# Patient Record
Sex: Female | Born: 1935 | Race: White | Hispanic: No | Smoking: Former smoker
Health system: Southern US, Community
[De-identification: ages and names within clinical notes are randomized; demographics above are authoritative.]

## PROBLEM LIST (undated history)

## (undated) DIAGNOSIS — I5022 Chronic systolic (congestive) heart failure: Secondary | ICD-10-CM

## (undated) DIAGNOSIS — H409 Unspecified glaucoma: Secondary | ICD-10-CM

## (undated) DIAGNOSIS — Z8489 Family history of other specified conditions: Secondary | ICD-10-CM

## (undated) DIAGNOSIS — E785 Hyperlipidemia, unspecified: Secondary | ICD-10-CM

## (undated) DIAGNOSIS — I272 Pulmonary hypertension, unspecified: Secondary | ICD-10-CM

## (undated) DIAGNOSIS — M1990 Unspecified osteoarthritis, unspecified site: Secondary | ICD-10-CM

## (undated) DIAGNOSIS — I480 Paroxysmal atrial fibrillation: Secondary | ICD-10-CM

## (undated) DIAGNOSIS — G43909 Migraine, unspecified, not intractable, without status migrainosus: Secondary | ICD-10-CM

## (undated) DIAGNOSIS — G4700 Insomnia, unspecified: Secondary | ICD-10-CM

## (undated) DIAGNOSIS — E039 Hypothyroidism, unspecified: Secondary | ICD-10-CM

## (undated) DIAGNOSIS — Z8781 Personal history of (healed) traumatic fracture: Secondary | ICD-10-CM

## (undated) DIAGNOSIS — M8580 Other specified disorders of bone density and structure, unspecified site: Secondary | ICD-10-CM

## (undated) DIAGNOSIS — Z972 Presence of dental prosthetic device (complete) (partial): Secondary | ICD-10-CM

## (undated) DIAGNOSIS — R739 Hyperglycemia, unspecified: Secondary | ICD-10-CM

## (undated) DIAGNOSIS — D649 Anemia, unspecified: Secondary | ICD-10-CM

## (undated) DIAGNOSIS — J449 Chronic obstructive pulmonary disease, unspecified: Secondary | ICD-10-CM

## (undated) HISTORY — PX: REFRACTIVE SURGERY: SHX103

## (undated) HISTORY — PX: CATARACT EXTRACTION: SUR2

## (undated) HISTORY — DX: Personal history of (healed) traumatic fracture: Z87.81

## (undated) HISTORY — DX: Chronic obstructive pulmonary disease, unspecified: J44.9

## (undated) HISTORY — DX: Chronic systolic (congestive) heart failure: I50.22

## (undated) HISTORY — DX: Paroxysmal atrial fibrillation: I48.0

## (undated) HISTORY — DX: Anemia, unspecified: D64.9

## (undated) HISTORY — DX: Hyperglycemia, unspecified: R73.9

## (undated) HISTORY — DX: Pulmonary hypertension, unspecified: I27.20

## (undated) HISTORY — DX: Hypothyroidism, unspecified: E03.9

## (undated) HISTORY — DX: Unspecified glaucoma: H40.9

## (undated) HISTORY — DX: Unspecified osteoarthritis, unspecified site: M19.90

## (undated) HISTORY — DX: Insomnia, unspecified: G47.00

## (undated) HISTORY — DX: Other specified disorders of bone density and structure, unspecified site: M85.80

## (undated) HISTORY — DX: Hyperlipidemia, unspecified: E78.5

## (undated) HISTORY — DX: Migraine, unspecified, not intractable, without status migrainosus: G43.909

---

## 2005-02-26 ENCOUNTER — Ambulatory Visit: Payer: Self-pay | Admitting: Unknown Physician Specialty

## 2005-02-26 ENCOUNTER — Other Ambulatory Visit: Payer: Self-pay

## 2009-06-07 ENCOUNTER — Ambulatory Visit: Payer: Self-pay | Admitting: Unknown Physician Specialty

## 2009-06-09 ENCOUNTER — Ambulatory Visit: Payer: Self-pay | Admitting: Unknown Physician Specialty

## 2010-04-23 HISTORY — PX: COLONOSCOPY: SHX174

## 2010-07-06 ENCOUNTER — Ambulatory Visit: Payer: Self-pay | Admitting: Unknown Physician Specialty

## 2010-08-17 ENCOUNTER — Ambulatory Visit (INDEPENDENT_AMBULATORY_CARE_PROVIDER_SITE_OTHER): Payer: Medicare Other | Admitting: Cardiovascular Disease

## 2010-08-17 ENCOUNTER — Encounter: Payer: Self-pay | Admitting: Cardiovascular Disease

## 2010-08-17 VITALS — BP 134/78 | HR 113 | Ht 60.0 in | Wt 133.0 lb

## 2010-08-17 DIAGNOSIS — F17200 Nicotine dependence, unspecified, uncomplicated: Secondary | ICD-10-CM | POA: Insufficient documentation

## 2010-08-17 DIAGNOSIS — E785 Hyperlipidemia, unspecified: Secondary | ICD-10-CM | POA: Insufficient documentation

## 2010-08-17 DIAGNOSIS — I4891 Unspecified atrial fibrillation: Secondary | ICD-10-CM

## 2010-08-17 DIAGNOSIS — I495 Sick sinus syndrome: Secondary | ICD-10-CM | POA: Insufficient documentation

## 2010-08-17 DIAGNOSIS — I471 Supraventricular tachycardia, unspecified: Secondary | ICD-10-CM | POA: Insufficient documentation

## 2010-08-17 MED ORDER — METOPROLOL TARTRATE 25 MG PO TABS: 25.0000 mg | ORAL_TABLET | Freq: Two times a day (BID) | ORAL | Status: DC

## 2010-08-17 MED ORDER — DABIGATRAN ETEXILATE MESYLATE 150 MG PO CAPS: 150.0000 mg | ORAL_CAPSULE | Freq: Two times a day (BID) | ORAL | Status: DC

## 2010-08-17 NOTE — Assessment & Plan Note (Signed)
Would continue simvastatin 20 mg daily.

## 2010-08-17 NOTE — Patient Instructions (Addendum)
Please start pradaxa one in the Am and PM. This is a blood thinner. Please start metoprolol 25mg  1/2 tab in Am and PM. This is for heart rate control. Please monitor your BP and bring list of results to next appt. Your physician has requested that you have an echocardiogram. Echocardiography is a painless test that uses sound waves to create images of your heart. It provides your doctor with information about the size and shape of your heart and how well your heart's chambers and valves are working. This procedure takes approximately one hour. There are no restrictions for this procedure. Your appt for this is next Thursday:  Your physician recommends that you schedule a follow-up appointment in: 2 weeks:

## 2010-08-17 NOTE — Assessment & Plan Note (Signed)
She has bradycardia on exam, atrial fibrillation on EKG with rapid rate. I am concerned that she might need a pacemaker. We will start with low-dose metoprolol tartrate and have her monitor her blood pressure and heart rate at home. EKG pending. We will likely need a two-day Holter monitor.

## 2010-08-17 NOTE — Assessment & Plan Note (Signed)
We have encouraged her to stop smoking. She continues to smoke several cigarettes per day.

## 2010-08-17 NOTE — Progress Notes (Signed)
   Patient ID: Vanessa [name], female    DOB: [birth year], 76 y.o.   MRN: [id number]  HPI Comments: [name] is a very pleasant 76 year old woman, patient of Dr. Barnabas Lister, who presents by referral after she developed atrial fibrillation prior to her colonoscopy. She presents for evaluation of atrial fibrillation. She does have severe migraines at times, history of hyperlipidemia and hypothyroidism. She continues to smoke.  She reports that she does have frequent periods of anxiety. She takes Xanax at nighttime which seems to help these episodes. She is uncertain if she does appreciate periods of rapid atrial fibrillation like she did in the hospital recently. She was scheduled to have her colonoscopy and prior to the procedure, she reports having a run of atrial fibrillation noted by the anesthesiologist. She reports that after she calmed down, her rhythm went back to normal and she then proceeded with the colonoscopy. She denies any chest tightness, lightheadedness or dizziness. If she has a bad migraine, sometimes she gets dizzy.   She routinely checks her blood pressure at home and reports that it is never high, typically borderline low.  EKG today shows atrial fibrillation converting to normal sinus rhythm with APCs. Rate while in atrial fibrillation was 140-150 beats per minute     Review of Systems  Constitutional: Negative.   HENT: Negative.   Eyes: Negative.   Respiratory: Negative.   Cardiovascular: Positive for palpitations.  Gastrointestinal: Negative.   Musculoskeletal: Negative.   Skin: Negative.   Neurological: Negative.   Hematological: Negative.   Psychiatric/Behavioral: The patient is nervous/anxious.   All other systems reviewed and are negative.   BP 134/78  Pulse 113  Ht 5' (1.524 m)  Wt 133 lb (60.328 kg)  BMI 25.97 kg/m2   Physical Exam  Nursing note and vitals reviewed. Constitutional: She is oriented to person, place, and time. She appears  well-developed and well-nourished.  HENT:  Head: Normocephalic.  Nose: Nose normal.  Mouth/Throat: Oropharynx is clear and moist.  Eyes: Conjunctivae are normal. Pupils are equal, round, and reactive to light.  Neck: Normal range of motion. Neck supple. No JVD present.  Cardiovascular: Regular rhythm, S1 normal, S2 normal and intact distal pulses.   Occasional extrasystoles are present. Bradycardia present.  Exam reveals no gallop and no friction rub.   Murmur heard.  Crescendo systolic murmur is present with a grade of 1/6  Pulses:      Carotid pulses are 2+ on the right side, and 2+ on the left side.      Radial pulses are 2+ on the right side, and 2+ on the left side.       Posterior tibial pulses are 1+ on the right side, and 1+ on the left side.  Pulmonary/Chest: Effort normal and breath sounds normal. No respiratory distress. She has no wheezes. She has no rales. She exhibits no tenderness.  Abdominal: Soft. Bowel sounds are normal. She exhibits no distension. There is no tenderness.  Musculoskeletal: Normal range of motion. She exhibits no edema and no tenderness.  Lymphadenopathy:    She has no cervical adenopathy.  Neurological: She is alert and oriented to person, place, and time. Coordination normal.  Skin: Skin is warm and dry. No rash noted. No erythema.  Psychiatric: She has a normal mood and affect. Her behavior is normal. Judgment and thought content normal.         Assessment and Plan

## 2010-08-17 NOTE — Assessment & Plan Note (Signed)
She has paroxysmal atrial fibrillation. She has converted back to sinus rhythm today after EKG showed atrial fibrillation in the office. We have ordered an echocardiogram. We will start metoprolol tartrate 12.5 mg b.i.d. For rhythm control. I suspect she has underlying sick sinus syndrome. We will start her on pradaxa 150 mg b.i.d..

## 2010-08-23 ENCOUNTER — Other Ambulatory Visit: Payer: Self-pay | Admitting: Cardiovascular Disease

## 2010-08-23 DIAGNOSIS — I4891 Unspecified atrial fibrillation: Secondary | ICD-10-CM

## 2010-08-24 ENCOUNTER — Ambulatory Visit (INDEPENDENT_AMBULATORY_CARE_PROVIDER_SITE_OTHER): Payer: Medicare Other | Admitting: *Deleted

## 2010-08-24 ENCOUNTER — Other Ambulatory Visit: Payer: Self-pay | Admitting: *Deleted

## 2010-08-24 DIAGNOSIS — I4891 Unspecified atrial fibrillation: Secondary | ICD-10-CM

## 2010-08-24 DIAGNOSIS — I495 Sick sinus syndrome: Secondary | ICD-10-CM

## 2010-08-27 ENCOUNTER — Encounter: Payer: Self-pay | Admitting: Cardiovascular Disease

## 2010-09-01 ENCOUNTER — Ambulatory Visit (INDEPENDENT_AMBULATORY_CARE_PROVIDER_SITE_OTHER): Payer: Medicare Other | Admitting: Cardiovascular Disease

## 2010-09-01 ENCOUNTER — Encounter: Payer: Self-pay | Admitting: Cardiovascular Disease

## 2010-09-01 DIAGNOSIS — I4891 Unspecified atrial fibrillation: Secondary | ICD-10-CM

## 2010-09-01 DIAGNOSIS — E785 Hyperlipidemia, unspecified: Secondary | ICD-10-CM

## 2010-09-01 DIAGNOSIS — I495 Sick sinus syndrome: Secondary | ICD-10-CM

## 2010-09-01 DIAGNOSIS — F17200 Nicotine dependence, unspecified, uncomplicated: Secondary | ICD-10-CM

## 2010-09-01 MED ORDER — METOPROLOL TARTRATE 12.5 MG HALF TABLET: 12.5000 mg | ORAL_TABLET | Freq: Every day | ORAL | Status: DC

## 2010-09-01 NOTE — Assessment & Plan Note (Signed)
She would like to stop the anticoagulation. She denies any palp patient's concerning for atrial fibrillation. We will decrease the metoprolol, at her request all the anticoagulation and monitor her closely.

## 2010-09-01 NOTE — Patient Instructions (Addendum)
You are doing well. Stop Pradaxa for now, keep your samples in case we restart medication. Decrease Metoprolol to 1/2 tablet once daily. Please call us if you have new issues that need to be addressed before your next appt.  Your physician recommends that you schedule a follow-up appointment in: 4-6 weeks.

## 2010-09-01 NOTE — Assessment & Plan Note (Signed)
We have encouraged her to continue to work on weaning his cigarettes and smoking cessation.

## 2010-09-01 NOTE — Assessment & Plan Note (Signed)
Continue the simvastatin.  

## 2010-09-01 NOTE — Assessment & Plan Note (Signed)
She has bradycardia on very low-dose metoprolol 12.5 mg b.i.d. We will cut her metoprolol back to 12.5 mg daily in the morning.

## 2010-09-01 NOTE — Progress Notes (Signed)
   Patient ID: Vanessa [name], female    DOB: 09-Jan-1935, 76 y.o.   MRN: [id number]  HPI Comments: s. Felber is a very pleasant 76 year old woman, patient of Dr. Barnabas Lister, who presents by referral after she developed atrial fibrillation prior to her colonoscopy. She presents for evaluation of atrial fibrillation. She does have severe migraines at times, history of hyperlipidemia and hypothyroidism. She continues to smoke.   She was started on metoprolol 12.5 mg b.i.d. With pradaxa. She would like to stop the blood thinner. She did not provide a reason. She also feels very fatigued and would like to stop the metoprolol. She does provide blood pressure and heart rate numbers. Blood pressure is in the 100s to 120 range. Pulse is in the high 40s to 50s, occasional 60s  She reports that her blood pressure typically runs low  EKG today shows Sinus bradycardia with rate 42 beats per minute, no significant ST or T wave changes      Review of Systems  Constitutional: Positive for fatigue.  HENT: Negative.   Eyes: Negative.   Respiratory: Negative.   Cardiovascular: Negative.   Gastrointestinal: Negative.   Musculoskeletal: Negative.   Skin: Negative.   Neurological: Negative.   Hematological: Negative.   Psychiatric/Behavioral: Negative.   All other systems reviewed and are negative.    BP 98/62  Pulse 43  Ht 5\' 4"  (1.626 m)  Wt 133 lb (60.328 kg)  BMI 22.83 kg/m2   Physical Exam  Nursing note and vitals reviewed. Constitutional: She is oriented to person, place, and time. She appears well-developed and well-nourished.  HENT:  Head: Normocephalic.  Nose: Nose normal.  Mouth/Throat: Oropharynx is clear and moist.  Eyes: Conjunctivae are normal. Pupils are equal, round, and reactive to light.  Neck: Normal range of motion. Neck supple. No JVD present.  Cardiovascular: Normal rate, regular rhythm, S1 normal, S2 normal, normal heart sounds and intact distal pulses.  Exam reveals  no gallop and no friction rub.   No murmur heard. Pulmonary/Chest: Effort normal and breath sounds normal. No respiratory distress. She has no wheezes. She has no rales. She exhibits no tenderness.  Abdominal: Soft. Bowel sounds are normal. She exhibits no distension. There is no tenderness.  Musculoskeletal: Normal range of motion. She exhibits no edema and no tenderness.  Lymphadenopathy:    She has no cervical adenopathy.  Neurological: She is alert and oriented to person, place, and time. Coordination normal.  Skin: Skin is warm and dry. No rash noted. No erythema.  Psychiatric: She has a normal mood and affect. Her behavior is normal. Judgment and thought content normal.         Assessment and Plan

## 2010-09-19 ENCOUNTER — Encounter: Payer: Self-pay | Admitting: Cardiovascular Disease

## 2010-10-16 ENCOUNTER — Encounter: Payer: Self-pay | Admitting: Cardiovascular Disease

## 2010-10-16 ENCOUNTER — Ambulatory Visit (INDEPENDENT_AMBULATORY_CARE_PROVIDER_SITE_OTHER): Payer: Medicare Other | Admitting: Cardiovascular Disease

## 2010-10-16 VITALS — BP 122/68 | HR 47 | Ht 64.0 in | Wt 133.0 lb

## 2010-10-16 DIAGNOSIS — I4891 Unspecified atrial fibrillation: Secondary | ICD-10-CM

## 2010-10-16 DIAGNOSIS — I495 Sick sinus syndrome: Secondary | ICD-10-CM

## 2010-10-16 DIAGNOSIS — F17200 Nicotine dependence, unspecified, uncomplicated: Secondary | ICD-10-CM

## 2010-10-16 DIAGNOSIS — E785 Hyperlipidemia, unspecified: Secondary | ICD-10-CM

## 2010-10-16 MED ORDER — METOPROLOL TARTRATE 12.5 MG HALF TABLET: 12.5000 mg | ORAL_TABLET | Freq: Every day | ORAL | Status: DC | PRN

## 2010-10-16 NOTE — Assessment & Plan Note (Signed)
No further episodes of atrial fibrillation that she can appreciate. She continues to have bradycardia. She is less symptomatic on very low-dose metoprolol. As her blood pressure and heart rate continued to be very low, we have suggested that perhaps she hold the metoprolol and take only a very low dose as needed for palpitations.

## 2010-10-16 NOTE — Patient Instructions (Signed)
You are doing well. Please hold the metoprolol as your heart rate is low. Use a low dose of metoprolol a needed.  Please call us if you have new issues that need to be addressed before your next appt.  We will call you for a follow up Appt. In 6 months

## 2010-10-16 NOTE — Assessment & Plan Note (Signed)
We have suggested she continue her current cholesterol medication

## 2010-10-16 NOTE — Assessment & Plan Note (Signed)
We have encouraged her to continue to work on weaning her cigarettes and smoking cessation. She will continue to work on this and does not want any assistance with chantix.  

## 2010-10-16 NOTE — Progress Notes (Signed)
   Patient ID: Vanessa [name], female    DOB: 20-Sep-1934, 76 y.o.   MRN: [id number]  HPI Comments: Vanessa [name] is a very pleasant 76 year old woman, patient of Dr. Barnabas Lister, who presents by referral after she developed atrial fibrillation prior to her colonoscopy.  She does have severe migraines at times, history of hyperlipidemia and hypothyroidism. She continues to smoke 2 cigarettes a day which she started after she lost her husband. She presents for routine followup.  On her last clinic visit, she had significant bradycardia on low-dose metoprolol. We suggested she try even a very low dose such as a quarter of a pill of metoprolol tartrate 25 mg. She has been doing this and according her heart rate and blood pressures. Typically the heart rate runs in the high 40s to 60 range, blood pressures running in the 90s to 110 range systolic. She feels better on less metoprolol and otherwise has no complaints.  EKG today shows Sinus bradycardia with rate 47 beats per minute, no significant ST or T wave changes, Rare APC      Review of Systems  Constitutional: Positive for fatigue.  HENT: Negative.   Eyes: Negative.   Respiratory: Negative.   Cardiovascular: Negative.   Gastrointestinal: Negative.   Musculoskeletal: Negative.   Skin: Negative.   Neurological: Negative.   Hematological: Negative.   Psychiatric/Behavioral: Negative.   All other systems reviewed and are negative.    BP 122/68  Pulse 47  Ht 5\' 4"  (1.626 m)  Wt 133 lb (60.328 kg)  BMI 22.83 kg/m2   Physical Exam  Nursing note and vitals reviewed. Constitutional: She is oriented to person, place, and time. She appears well-developed and well-nourished.  HENT:  Head: Normocephalic.  Nose: Nose normal.  Mouth/Throat: Oropharynx is clear and moist.  Eyes: Conjunctivae are normal. Pupils are equal, round, and reactive to light.  Neck: Normal range of motion. Neck supple. No JVD present.  Cardiovascular: Regular rhythm, S1  normal, S2 normal, normal heart sounds and intact distal pulses.  Bradycardia present.  Exam reveals no gallop and no friction rub.   No murmur heard. Pulmonary/Chest: Effort normal and breath sounds normal. No respiratory distress. She has no wheezes. She has no rales. She exhibits no tenderness.  Abdominal: Soft. Bowel sounds are normal. She exhibits no distension. There is no tenderness.  Musculoskeletal: Normal range of motion. She exhibits no edema and no tenderness.  Lymphadenopathy:    She has no cervical adenopathy.  Neurological: She is alert and oriented to person, place, and time. Coordination normal.  Skin: Skin is warm and dry. No rash noted. No erythema.  Psychiatric: She has a normal mood and affect. Her behavior is normal. Judgment and thought content normal.         Assessment and Plan

## 2010-12-12 ENCOUNTER — Ambulatory Visit: Payer: Self-pay | Admitting: Ophthalmology

## 2011-01-09 ENCOUNTER — Ambulatory Visit: Payer: Medicare Other | Admitting: Family Medicine

## 2011-01-24 ENCOUNTER — Ambulatory Visit (INDEPENDENT_AMBULATORY_CARE_PROVIDER_SITE_OTHER): Payer: Medicare Other | Admitting: Family Medicine

## 2011-01-24 ENCOUNTER — Encounter: Payer: Self-pay | Admitting: Family Medicine

## 2011-01-24 VITALS — BP 136/66 | HR 59 | Temp 99.0°F | Ht 64.0 in | Wt 128.0 lb

## 2011-01-24 DIAGNOSIS — E039 Hypothyroidism, unspecified: Secondary | ICD-10-CM

## 2011-01-24 DIAGNOSIS — E7800 Pure hypercholesterolemia, unspecified: Secondary | ICD-10-CM

## 2011-01-24 DIAGNOSIS — G4700 Insomnia, unspecified: Secondary | ICD-10-CM

## 2011-01-24 DIAGNOSIS — E785 Hyperlipidemia, unspecified: Secondary | ICD-10-CM

## 2011-01-24 DIAGNOSIS — M8580 Other specified disorders of bone density and structure, unspecified site: Secondary | ICD-10-CM

## 2011-01-24 DIAGNOSIS — I4891 Unspecified atrial fibrillation: Secondary | ICD-10-CM

## 2011-01-24 DIAGNOSIS — M899 Disorder of bone, unspecified: Secondary | ICD-10-CM

## 2011-01-24 DIAGNOSIS — F17200 Nicotine dependence, unspecified, uncomplicated: Secondary | ICD-10-CM

## 2011-01-24 DIAGNOSIS — D649 Anemia, unspecified: Secondary | ICD-10-CM

## 2011-01-24 DIAGNOSIS — Z23 Encounter for immunization: Secondary | ICD-10-CM

## 2011-01-24 DIAGNOSIS — Z7189 Other specified counseling: Secondary | ICD-10-CM

## 2011-01-24 DIAGNOSIS — IMO0001 Reserved for inherently not codable concepts without codable children: Secondary | ICD-10-CM

## 2011-01-24 NOTE — Progress Notes (Signed)
To est care.  Migraine.  No aura.  Triggered with weather related, certain scents. H/o syncope with migraine, but not in years.  She pre treats with her meds with some effect.  Sig FH of migraine.   AF.  During colonoscopy.  No sx since then.  Has prn BB to use.  No CP, not SOB.  Feeling well from this standpoint.   Smoking.  Cut down to 1-2 cigs a day.    Osteopenia.  Will be due for repeat DXA 2013.  On vit D and s/p 10 years of fosamax.   Elevated Cholesterol: Using medications without problems:yes Muscle aches: minimal Diet compliance:yes Exercise:yes  PMH and SH reviewed  Meds, vitals, and allergies reviewed.   ROS: See HPI.  Otherwise negative.    GEN: nad, alert and oriented, thin, pleasant in conversation HEENT: mucous membranes moist NECK: supple w/o LA CV: rrr, not brady, no ectopy PULM: ctab, no inc wob ABD: soft, +bs EXT: no edema SKIN: no acute rash

## 2011-01-24 NOTE — Patient Instructions (Addendum)
I would get your lab appointment (fasting) before a physical in early Jan. 2013.   Please bring in a copy of your living will.  Don't change your meds.   Take care.  Glad to see you.  Let me know if you have concerns in the meantime.

## 2011-01-25 ENCOUNTER — Encounter: Payer: Self-pay | Admitting: Family Medicine

## 2011-01-25 DIAGNOSIS — G4700 Insomnia, unspecified: Secondary | ICD-10-CM | POA: Insufficient documentation

## 2011-01-25 DIAGNOSIS — M8580 Other specified disorders of bone density and structure, unspecified site: Secondary | ICD-10-CM | POA: Insufficient documentation

## 2011-01-25 DIAGNOSIS — E039 Hypothyroidism, unspecified: Secondary | ICD-10-CM | POA: Insufficient documentation

## 2011-01-25 DIAGNOSIS — Z23 Encounter for immunization: Secondary | ICD-10-CM

## 2011-01-25 NOTE — Assessment & Plan Note (Signed)
Per cards  

## 2011-01-25 NOTE — Assessment & Plan Note (Signed)
Continue BZD for now.

## 2011-01-25 NOTE — Assessment & Plan Note (Signed)
No change in meds.

## 2011-01-25 NOTE — Assessment & Plan Note (Signed)
D/w pt about cessation.  

## 2011-01-25 NOTE — Assessment & Plan Note (Signed)
Continue current meds.  TSH wnl.  

## 2011-04-23 ENCOUNTER — Other Ambulatory Visit: Payer: Medicare Other

## 2011-04-23 ENCOUNTER — Ambulatory Visit: Payer: Medicare Other | Admitting: Cardiovascular Disease

## 2011-04-26 ENCOUNTER — Encounter: Payer: Medicare Other | Admitting: Family Medicine

## 2011-05-09 ENCOUNTER — Encounter: Payer: Self-pay | Admitting: Cardiovascular Disease

## 2011-05-09 ENCOUNTER — Ambulatory Visit (INDEPENDENT_AMBULATORY_CARE_PROVIDER_SITE_OTHER): Payer: Medicare Other | Admitting: Cardiovascular Disease

## 2011-05-09 DIAGNOSIS — E785 Hyperlipidemia, unspecified: Secondary | ICD-10-CM

## 2011-05-09 DIAGNOSIS — I495 Sick sinus syndrome: Secondary | ICD-10-CM

## 2011-05-09 DIAGNOSIS — I4891 Unspecified atrial fibrillation: Secondary | ICD-10-CM

## 2011-05-09 DIAGNOSIS — F17200 Nicotine dependence, unspecified, uncomplicated: Secondary | ICD-10-CM

## 2011-05-09 NOTE — Assessment & Plan Note (Signed)
I suspect her short runs of tachycardia and palpitations is atrial fibrillation given her previous history. Unable to exclude other arrhythmia such as atrial tachycardia or ectopy though her rhythm was heard on auscultation briefly. She does not want additional antiarrhythmic medications at this time while workup including Holter. She reports they are rare, she is relatively asymptomatic. We have suggested if she has more of these episodes and if they last longer, that she call our office for further evaluation. We did discuss possible stroke risk if she has significant arrhythmia and the need for further workup.

## 2011-05-09 NOTE — Patient Instructions (Signed)
You are doing well. No medication changes were made.  Please call us if you have new issues that need to be addressed before your next appt.  Your physician wants you to follow-up in: 6 months.  You will receive a reminder letter in the mail two months in advance. If you don't receive a letter, please call our office to schedule the follow-up appointment.   

## 2011-05-09 NOTE — Assessment & Plan Note (Signed)
Continue simvastatin 20 mg daily 

## 2011-05-09 NOTE — Assessment & Plan Note (Signed)
We have encouraged her to continue to work on weaning her cigarettes and smoking cessation.  

## 2011-05-09 NOTE — Progress Notes (Signed)
Patient ID: Vanessa [name], female    DOB: December 05, 1934, 77 y.o.   MRN: [id number]  HPI Comments: Vanessa [name] is a very pleasant 77 year old woman with a hx of atrial fibrillation prior to her colonoscopy, h/o severe migraines at times, history of hyperlipidemia and hypothyroidism. She continues to smoke 2 cigarettes a day which she started after she lost her husband. She presents for routine followup.  She reports that she has rare episodes of palpitations and tachycardia. These typically happen when she is anxious. She had a brief episode in the waiting room today and while on the exam table. They do not last for long, typically several minutes at the most. She reports having them on a rare basis and again only when she is anxious. She is able to exert herself to a significant degree and is unable to reproduce the arrhythmia. She takes low-dose metoprolol on occasional basis.  Otherwise she feels well has no complaints.  EKG today shows Sinus bradycardia with rate 52 beats per minute, no significant ST or T wave changes, Rare APC    Outpatient Encounter Prescriptions as of 05/09/2011  Medication Sig Dispense Refill  . ALPRAZolam (XANAX) 0.5 MG tablet Take 0.5 mg by mouth at bedtime as needed.        Marland Kitchen aspirin 81 MG EC tablet Take 81 mg by mouth daily.        Marland Kitchen aspirin-acetaminophen-caffeine (EXCEDRIN MIGRAINE) 250-250-65 MG per tablet Take 1 tablet by mouth every 6 (six) hours as needed.        . Calcium Carbonate-Vitamin D (CALCIUM 600 + D PO) Take 600 mg by mouth 2 (two) times daily.        . Cholecalciferol (VITAMIN D PO) Take by mouth daily.        . fish oil-omega-3 fatty acids 1000 MG capsule Take 1 g by mouth daily.        Marland Kitchen levothyroxine (SYNTHROID, LEVOTHROID) 50 MCG tablet Take 50 mcg by mouth daily.        . metoprolol tartrate (LOPRESSOR) 12.5 mg TABS Take 6.25 mg by mouth daily as needed. Take 1/4 tablet before procedures due to h/o A fib during colonsocopy       . simvastatin  (ZOCOR) 20 MG tablet Take 20 mg by mouth at bedtime.        . Thiamine HCl (VITAMIN B-1) 100 MG tablet Take 100 mg by mouth daily.        . traMADol (ULTRAM) 50 MG tablet Take 50 mg by mouth every 6 (six) hours as needed.        . vitamin B-12 (CYANOCOBALAMIN) 1000 MCG tablet Take 1,000 mcg by mouth daily.        . vitamin C (ASCORBIC ACID) 500 MG tablet Take 500 mg by mouth daily.        Marland Kitchen DISCONTD: SUMAtriptan (IMITREX) 100 MG tablet Take 100 mg by mouth every 2 (two) hours as needed.           Review of Systems  Constitutional: Positive for fatigue.  HENT: Negative.   Eyes: Negative.   Respiratory: Negative.   Cardiovascular: Positive for palpitations.  Gastrointestinal: Negative.   Musculoskeletal: Negative.   Skin: Negative.   Neurological: Negative.   Hematological: Negative.   Psychiatric/Behavioral: Negative.   All other systems reviewed and are negative.    BP 140/80  Pulse 52  Ht 5\' 4"  (1.626 m)  Wt 127 lb (57.607 kg)  BMI 21.80 kg/m2  Physical Exam  Nursing note and vitals reviewed. Constitutional: She is oriented to person, place, and time. She appears well-developed and well-nourished.  HENT:  Head: Normocephalic.  Nose: Nose normal.  Mouth/Throat: Oropharynx is clear and moist.  Eyes: Conjunctivae are normal. Pupils are equal, round, and reactive to light.  Neck: Normal range of motion. Neck supple. No JVD present.  Cardiovascular: Regular rhythm, S1 normal, S2 normal and intact distal pulses.  Bradycardia present.  Exam reveals no gallop and no friction rub.   Murmur heard.  Crescendo systolic murmur is present with a grade of 2/6  Pulmonary/Chest: Effort normal and breath sounds normal. No respiratory distress. She has no wheezes. She has no rales. She exhibits no tenderness.  Abdominal: Soft. Bowel sounds are normal. She exhibits no distension. There is no tenderness.  Musculoskeletal: Normal range of motion. She exhibits no edema and no tenderness.    Lymphadenopathy:    She has no cervical adenopathy.  Neurological: She is alert and oriented to person, place, and time. Coordination normal.  Skin: Skin is warm and dry. No rash noted. No erythema.  Psychiatric: She has a normal mood and affect. Her behavior is normal. Judgment and thought content normal.         Assessment and Plan

## 2011-05-10 ENCOUNTER — Other Ambulatory Visit (INDEPENDENT_AMBULATORY_CARE_PROVIDER_SITE_OTHER): Payer: Medicare Other

## 2011-05-10 DIAGNOSIS — M8580 Other specified disorders of bone density and structure, unspecified site: Secondary | ICD-10-CM

## 2011-05-10 DIAGNOSIS — D649 Anemia, unspecified: Secondary | ICD-10-CM

## 2011-05-10 DIAGNOSIS — E039 Hypothyroidism, unspecified: Secondary | ICD-10-CM

## 2011-05-10 DIAGNOSIS — E7800 Pure hypercholesterolemia, unspecified: Secondary | ICD-10-CM

## 2011-05-10 LAB — COMPREHENSIVE METABOLIC PANEL
BUN: 13 mg/dL (ref 6–23)
CO2: 28 mEq/L (ref 19–32)
Calcium: 9.3 mg/dL (ref 8.4–10.5)
Chloride: 101 mEq/L (ref 96–112)
Creatinine, Ser: 0.8 mg/dL (ref 0.4–1.2)
GFR: 73.34 mL/min (ref >60.00)
Total Bilirubin: 0.7 mg/dL (ref 0.3–1.2)

## 2011-05-10 LAB — LIPID PANEL
HDL: 60.6 mg/dL (ref >39.00)
Total CHOL/HDL Ratio: 2
VLDL: 14.6 mg/dL (ref 0.0–40.0)

## 2011-05-10 LAB — CBC WITH DIFFERENTIAL/PLATELET
Basophils Absolute: 0 10*3/uL (ref 0.0–0.1)
Basophils Relative: 0.3 % (ref 0.0–3.0)
Eosinophils Absolute: 0.4 10*3/uL (ref 0.0–0.7)
Lymphocytes Relative: 23.4 % (ref 12.0–46.0)
Lymphs Abs: 1.7 10*3/uL (ref 0.7–4.0)
Monocytes Absolute: 0.6 10*3/uL (ref 0.1–1.0)
Neutro Abs: 4.5 10*3/uL (ref 1.4–7.7)
RBC: 4.34 Mil/uL (ref 3.87–5.11)
WBC: 7.2 10*3/uL (ref 4.5–10.5)

## 2011-05-10 LAB — TSH: TSH: 2 u[IU]/mL (ref 0.35–5.50)

## 2011-05-11 LAB — VITAMIN D 25 HYDROXY (VIT D DEFICIENCY, FRACTURES): Vit D, 25-Hydroxy: 58 ng/mL (ref 30–89)

## 2011-05-14 ENCOUNTER — Encounter: Payer: Self-pay | Admitting: Family Medicine

## 2011-05-14 ENCOUNTER — Ambulatory Visit (INDEPENDENT_AMBULATORY_CARE_PROVIDER_SITE_OTHER): Payer: Medicare Other | Admitting: Family Medicine

## 2011-05-14 DIAGNOSIS — G4700 Insomnia, unspecified: Secondary | ICD-10-CM

## 2011-05-14 DIAGNOSIS — E039 Hypothyroidism, unspecified: Secondary | ICD-10-CM

## 2011-05-14 DIAGNOSIS — Z23 Encounter for immunization: Secondary | ICD-10-CM

## 2011-05-14 DIAGNOSIS — Z0000 Encounter for general adult medical examination without abnormal findings: Secondary | ICD-10-CM

## 2011-05-14 DIAGNOSIS — E785 Hyperlipidemia, unspecified: Secondary | ICD-10-CM

## 2011-05-14 DIAGNOSIS — I4891 Unspecified atrial fibrillation: Secondary | ICD-10-CM

## 2011-05-14 DIAGNOSIS — F17200 Nicotine dependence, unspecified, uncomplicated: Secondary | ICD-10-CM

## 2011-05-14 DIAGNOSIS — Z1231 Encounter for screening mammogram for malignant neoplasm of breast: Secondary | ICD-10-CM

## 2011-05-14 DIAGNOSIS — M949 Disorder of cartilage, unspecified: Secondary | ICD-10-CM

## 2011-05-14 DIAGNOSIS — M8580 Other specified disorders of bone density and structure, unspecified site: Secondary | ICD-10-CM

## 2011-05-14 DIAGNOSIS — N951 Menopausal and female climacteric states: Secondary | ICD-10-CM

## 2011-05-14 MED ORDER — TRAMADOL HCL 50 MG PO TABS: 50.0000 mg | ORAL_TABLET | Freq: Four times a day (QID) | ORAL | Status: DC | PRN

## 2011-05-14 MED ORDER — ALPRAZOLAM 0.5 MG PO TABS: 0.5000 mg | ORAL_TABLET | Freq: Every evening | ORAL | Status: DC | PRN

## 2011-05-14 MED ORDER — LEVOTHYROXINE SODIUM 50 MCG PO TABS: 50.0000 ug | ORAL_TABLET | Freq: Every day | ORAL | Status: DC

## 2011-05-14 MED ORDER — SIMVASTATIN 20 MG PO TABS: 20.0000 mg | ORAL_TABLET | Freq: Every day | ORAL | Status: DC

## 2011-05-14 NOTE — Patient Instructions (Signed)
Don't change your meds.  Check with the health department about the Tdap shot. See Shirlee Limerick about your referral before you leave today. Glad to see you.  Call with question.  Recheck in 6 months.

## 2011-05-15 ENCOUNTER — Encounter: Payer: Self-pay | Admitting: Family Medicine

## 2011-05-15 DIAGNOSIS — Z0000 Encounter for general adult medical examination without abnormal findings: Secondary | ICD-10-CM | POA: Insufficient documentation

## 2011-05-15 NOTE — Assessment & Plan Note (Signed)
tsh wnl, no TMG on exam, no change in meds.

## 2011-05-15 NOTE — Progress Notes (Signed)
I have personally reviewed the Medicare Annual Wellness questionnaire and have noted 1. The patient's medical and social history 2. Their use of alcohol, tobacco or illicit drugs 3. Their current medications and supplements 4. The patient's functional ability including ADL's, fall risks, home safety risks and hearing or visual             impairment. 5. Diet and physical activities 6. Evidence for depression or mood disorders  The patients weight, height, BMI have been recorded in the chart (no visual deficit by history).  I have made referrals, counseling and provided education to the patient based review of the above and I have provided the pt with a written personalized care plan for preventive services.  Hypothyroid, complaint with meds. No neck mass, no dysphagia.    Elevated Cholesterol: Using medications without problems:yes Muscle aches: no Diet compliance:yes Exercise: some Other complaints:no  We discussed smoking cessation.  Migraines, takes tramadol prn.  Does well with this.  No new sx.  Needs refills.    Insomina, controlled with BZD at night.  Had needed since widowed.  No ADE from med.   PMH and SH reviewed  Meds, vitals, and allergies reviewed.   ROS: See HPI.  Otherwise negative.    GEN: nad, alert and oriented HEENT: mucous membranes moist NECK: supple w/o LA CV: rrr. PULM: ctab, no inc wob ABD: soft, +bs EXT: no edema SKIN: no acute rash

## 2011-05-15 NOTE — Assessment & Plan Note (Signed)
Controlled , no change in meds 

## 2011-05-15 NOTE — Assessment & Plan Note (Signed)
She'll notify MD if sx return.  RRR today.

## 2011-05-15 NOTE — Assessment & Plan Note (Signed)
Controlled, continue simva.

## 2011-05-15 NOTE — Assessment & Plan Note (Signed)
Discussed cessation 

## 2011-05-15 NOTE — Assessment & Plan Note (Signed)
Recheck DXA ~4/13.

## 2011-05-15 NOTE — Assessment & Plan Note (Signed)
See scanned forms.  Healthy habits encouraged.  She's working on diet.  She'll f[facility]u at [facility] for T[facility]ap.  PNA shot given.  Plan for mammogram and DXA in 07/2011.

## 2011-06-01 ENCOUNTER — Other Ambulatory Visit: Payer: Self-pay | Admitting: *Deleted

## 2011-06-01 MED ORDER — LEVOTHYROXINE SODIUM 50 MCG PO TABS: 50.0000 ug | ORAL_TABLET | Freq: Every day | ORAL | Status: DC

## 2011-06-12 ENCOUNTER — Ambulatory Visit: Payer: Self-pay | Admitting: Family Medicine

## 2011-06-14 ENCOUNTER — Encounter: Payer: Self-pay | Admitting: Family Medicine

## 2011-06-17 ENCOUNTER — Encounter: Payer: Self-pay | Admitting: Family Medicine

## 2011-07-10 ENCOUNTER — Ambulatory Visit: Payer: Self-pay | Admitting: Family Medicine

## 2011-07-12 ENCOUNTER — Encounter: Payer: Self-pay | Admitting: Family Medicine

## 2011-07-13 ENCOUNTER — Encounter: Payer: Self-pay | Admitting: *Deleted

## 2011-07-16 ENCOUNTER — Encounter: Payer: Self-pay | Admitting: *Deleted

## 2011-07-16 ENCOUNTER — Encounter: Payer: Self-pay | Admitting: Family Medicine

## 2011-11-05 ENCOUNTER — Encounter: Payer: Self-pay | Admitting: Cardiovascular Disease

## 2011-11-05 ENCOUNTER — Ambulatory Visit (INDEPENDENT_AMBULATORY_CARE_PROVIDER_SITE_OTHER): Payer: Medicare Other | Admitting: Cardiovascular Disease

## 2011-11-05 VITALS — BP 120/70 | HR 60 | Ht 64.0 in | Wt 126.8 lb

## 2011-11-05 DIAGNOSIS — E785 Hyperlipidemia, unspecified: Secondary | ICD-10-CM

## 2011-11-05 DIAGNOSIS — I4891 Unspecified atrial fibrillation: Secondary | ICD-10-CM

## 2011-11-05 DIAGNOSIS — F17200 Nicotine dependence, unspecified, uncomplicated: Secondary | ICD-10-CM

## 2011-11-05 DIAGNOSIS — R002 Palpitations: Secondary | ICD-10-CM

## 2011-11-05 NOTE — Patient Instructions (Addendum)
You are doing well. No medication changes were made.  Please call us if you have new issues that need to be addressed before your next appt.  Your physician wants you to follow-up in: 12 months.  You will receive a reminder letter in the mail two months in advance. If you don't receive a letter, please call our office to schedule the follow-up appointment. 

## 2011-11-05 NOTE — Assessment & Plan Note (Signed)
Cholesterol is at goal on the current lipid regimen. No changes to the medications were made.  

## 2011-11-05 NOTE — Assessment & Plan Note (Signed)
Rare episodes of tachycardia. Uncertain if her most recent to episodes was atrial fibrillation or atrial tachycardia from stress. She takes low-dose beta blocker with resolution of her symptoms. Symptoms are rare. We will continue to treat her conservatively.

## 2011-11-05 NOTE — Assessment & Plan Note (Signed)
We have encouraged her to continue to work on weaning her cigarettes and smoking cessation. She will continue to work on this and does not want any assistance with chantix.  

## 2011-11-05 NOTE — Progress Notes (Signed)
Patient ID: Vanessa [name], female    DOB: 1934-09-26, 77 y.o.   MRN: [id number]  HPI Comments: Vanessa [name] is a very pleasant 77 year old woman with a hx of atrial fibrillation prior to her colonoscopy, h/o severe migraines at times, history of hyperlipidemia and hypothyroidism. She continues to smoke 2 cigarettes a day which she started after she lost her husband. She presents for routine followup.  She reports that she has rare episodes of palpitations and tachycardia. She reports having only 2 episodes of tachycardia since her last clinic visit. Both episodes have been associated with stressful situations such as sickness in the family. She had one episode several months ago, one episode this morning. On both occasions, family was in the emergency room. She does feel tired from her baseline bradycardia but otherwise feels well. She takes low-dose metoprolol on occasional basis when she has these episodes of tachycardia.   EKG today shows Sinus bradycardia with rate 47 beats per minute, no significant ST or T wave changes Total cholesterol 144    Outpatient Encounter Prescriptions as of 11/05/2011  Medication Sig Dispense Refill  . ALPRAZolam (XANAX) 0.5 MG tablet Take 1 tablet (0.5 mg total) by mouth at bedtime as needed.  90 tablet  1  . aspirin 81 MG EC tablet Take 81 mg by mouth daily.        Marland Kitchen aspirin-acetaminophen-caffeine (EXCEDRIN MIGRAINE) 250-250-65 MG per tablet Take 1 tablet by mouth every 6 (six) hours as needed.        . Calcium Carbonate-Vitamin D (CALCIUM 600 + D PO) Take 600 mg by mouth 2 (two) times daily.        . Cholecalciferol (VITAMIN D PO) Take by mouth daily.        . fish oil-omega-3 fatty acids 1000 MG capsule Take 1 g by mouth daily.        Marland Kitchen levothyroxine (SYNTHROID, LEVOTHROID) 50 MCG tablet Take 1 tablet (50 mcg total) by mouth daily.  90 tablet  3  . simvastatin (ZOCOR) 20 MG tablet Take 1 tablet (20 mg total) by mouth at bedtime.  90 tablet  3  . Thiamine  HCl (VITAMIN B-1) 100 MG tablet Take 100 mg by mouth daily.        . traMADol (ULTRAM) 50 MG tablet Take 1 tablet (50 mg total) by mouth every 6 (six) hours as needed.  180 tablet  3  . vitamin B-12 (CYANOCOBALAMIN) 1000 MCG tablet Take 1,000 mcg by mouth daily.        . vitamin C (ASCORBIC ACID) 500 MG tablet Take 500 mg by mouth daily.        . metoprolol tartrate (LOPRESSOR) 12.5 mg TABS Take 6.25 mg by mouth daily as needed. Take 1/4 tablet before procedures due to h/o A fib during colonsocopy        Review of Systems  Constitutional: Positive for fatigue.  HENT: Negative.   Eyes: Negative.   Respiratory: Negative.   Cardiovascular: Positive for palpitations.  Gastrointestinal: Negative.   Musculoskeletal: Negative.   Skin: Negative.   Neurological: Negative.   Hematological: Negative.   Psychiatric/Behavioral: Negative.   All other systems reviewed and are negative.    BP 120/70  Pulse 60  Ht 5\' 4"  (1.626 m)  Wt 126 lb 12 oz (57.493 kg)  BMI 21.76 kg/m2  Physical Exam  Nursing note and vitals reviewed. Constitutional: She is oriented to person, place, and time. She appears well-developed and well-nourished.  HENT:  Head: Normocephalic.  Nose: Nose normal.  Mouth/Throat: Oropharynx is clear and moist.  Eyes: Conjunctivae are normal. Pupils are equal, round, and reactive to light.  Neck: Normal range of motion. Neck supple. No JVD present.  Cardiovascular: Regular rhythm, S1 normal, S2 normal and intact distal pulses.  Bradycardia present.  Exam reveals no gallop and no friction rub.   Murmur heard.  Crescendo systolic murmur is present with a grade of 2/6  Pulmonary/Chest: Effort normal and breath sounds normal. No respiratory distress. She has no wheezes. She has no rales. She exhibits no tenderness.  Abdominal: Soft. Bowel sounds are normal. She exhibits no distension. There is no tenderness.  Musculoskeletal: Normal range of motion. She exhibits no edema and no  tenderness.  Lymphadenopathy:    She has no cervical adenopathy.  Neurological: She is alert and oriented to person, place, and time. Coordination normal.  Skin: Skin is warm and dry. No rash noted. No erythema.  Psychiatric: She has a normal mood and affect. Her behavior is normal. Judgment and thought content normal.         Assessment and Plan

## 2011-11-12 ENCOUNTER — Ambulatory Visit (INDEPENDENT_AMBULATORY_CARE_PROVIDER_SITE_OTHER): Payer: Medicare Other | Admitting: Family Medicine

## 2011-11-12 ENCOUNTER — Encounter: Payer: Self-pay | Admitting: Family Medicine

## 2011-11-12 VITALS — BP 112/64 | HR 49 | Temp 99.1°F | Wt 127.0 lb

## 2011-11-12 DIAGNOSIS — M899 Disorder of bone, unspecified: Secondary | ICD-10-CM

## 2011-11-12 DIAGNOSIS — M8580 Other specified disorders of bone density and structure, unspecified site: Secondary | ICD-10-CM

## 2011-11-12 DIAGNOSIS — G43909 Migraine, unspecified, not intractable, without status migrainosus: Secondary | ICD-10-CM | POA: Insufficient documentation

## 2011-11-12 DIAGNOSIS — F17200 Nicotine dependence, unspecified, uncomplicated: Secondary | ICD-10-CM

## 2011-11-12 DIAGNOSIS — I4891 Unspecified atrial fibrillation: Secondary | ICD-10-CM

## 2011-11-12 MED ORDER — ALPRAZOLAM 0.5 MG PO TABS: 0.5000 mg | ORAL_TABLET | Freq: Every evening | ORAL | Status: DC | PRN

## 2011-11-12 MED ORDER — TRAMADOL HCL 50 MG PO TABS: 50.0000 mg | ORAL_TABLET | Freq: Four times a day (QID) | ORAL | Status: DC | PRN

## 2011-11-12 NOTE — Assessment & Plan Note (Signed)
Consider recheck in 2015.  D/w pt.  She agrees.

## 2011-11-12 NOTE — Assessment & Plan Note (Signed)
Precontemplative

## 2011-11-12 NOTE — Progress Notes (Signed)
Osteopenia.  Is s/p 10 years of fosamax and intolerant of evista  Improved T score in spine and stable osteopenia in hips on DXA 05/2011  Consider recheck in 2015 Discussed w/pt.   Anxiety controlled with current meds.  No SI/HI.  Supportive family.  Son was ill with PE and this has been difficult for her.    Mammogram done and wnl.  Discussed.    Migraines continue w/o sig change.  Still taking tramadol prn with some effect.    Precontemplative about smoking cessation.    Has seen cards recently and no change in meds rec'd.  Rare BB use.    PMH and SH reviewed  ROS: See HPI, otherwise noncontributory.  Meds, vitals, and allergies reviewed.   GEN: nad, alert and oriented, thin, tearful episodically but then regains composure.  HEENT: mucous membranes moist NECK: supple w/o LA CV: rrr PULM: ctab, no inc wob ABD: soft, +bs EXT: no edema SKIN: no acute rash

## 2011-11-12 NOTE — Assessment & Plan Note (Signed)
With rare BB use.  Feels well.

## 2011-11-12 NOTE — Patient Instructions (Addendum)
[facility] on the tetanus shot at [facility].   Take care.  Schedule a physical in 6 months.  Glad to see you.

## 2011-11-12 NOTE — Assessment & Plan Note (Signed)
Continue current meds, ie tramadol. She has learned to deal with these headaches.  Recheck in 6 months.

## 2012-01-24 ENCOUNTER — Ambulatory Visit (INDEPENDENT_AMBULATORY_CARE_PROVIDER_SITE_OTHER): Payer: Medicare Other

## 2012-01-24 DIAGNOSIS — Z23 Encounter for immunization: Secondary | ICD-10-CM

## 2012-05-06 ENCOUNTER — Other Ambulatory Visit: Payer: Self-pay | Admitting: Family Medicine

## 2012-05-06 DIAGNOSIS — E7800 Pure hypercholesterolemia, unspecified: Secondary | ICD-10-CM

## 2012-05-06 DIAGNOSIS — M8580 Other specified disorders of bone density and structure, unspecified site: Secondary | ICD-10-CM

## 2012-05-06 DIAGNOSIS — I4891 Unspecified atrial fibrillation: Secondary | ICD-10-CM

## 2012-05-12 ENCOUNTER — Other Ambulatory Visit (INDEPENDENT_AMBULATORY_CARE_PROVIDER_SITE_OTHER): Payer: Medicare Other

## 2012-05-12 DIAGNOSIS — M8580 Other specified disorders of bone density and structure, unspecified site: Secondary | ICD-10-CM

## 2012-05-12 DIAGNOSIS — E7800 Pure hypercholesterolemia, unspecified: Secondary | ICD-10-CM

## 2012-05-12 DIAGNOSIS — I4891 Unspecified atrial fibrillation: Secondary | ICD-10-CM

## 2012-05-12 LAB — COMPREHENSIVE METABOLIC PANEL
Albumin: 4 g/dL (ref 3.5–5.2)
BUN: 15 mg/dL (ref 6–23)
CO2: 30 mEq/L (ref 19–32)
Calcium: 9.4 mg/dL (ref 8.4–10.5)
Chloride: 101 mEq/L (ref 96–112)
GFR: 77.55 mL/min (ref >60.00)
Glucose, Bld: 108 mg/dL — ABNORMAL HIGH (ref 70–99)
Potassium: 4.5 mEq/L (ref 3.5–5.1)
Sodium: 138 mEq/L (ref 135–145)
Total Protein: 7 g/dL (ref 6.0–8.3)

## 2012-05-12 LAB — CBC WITH DIFFERENTIAL/PLATELET
Eosinophils Relative: 4.6 % (ref 0.0–5.0)
Lymphocytes Relative: 20.3 % (ref 12.0–46.0)
MCV: 95.1 fl (ref 78.0–100.0)
Monocytes Absolute: 0.6 10*3/uL (ref 0.1–1.0)
Neutrophils Relative %: 67.5 % (ref 43.0–77.0)
Platelets: 210 10*3/uL (ref 150.0–400.0)
RBC: 4.77 Mil/uL (ref 3.87–5.11)
WBC: 8.8 10*3/uL (ref 4.5–10.5)

## 2012-05-12 LAB — LIPID PANEL
Cholesterol: 150 mg/dL (ref 0–200)
HDL: 58.5 mg/dL (ref >39.00)
LDL Cholesterol: 75 mg/dL (ref 0–99)
Triglycerides: 85 mg/dL (ref 0.0–149.0)

## 2012-05-15 ENCOUNTER — Encounter: Payer: Medicare Other | Admitting: Family Medicine

## 2012-05-19 ENCOUNTER — Encounter: Payer: Self-pay | Admitting: Family Medicine

## 2012-05-19 ENCOUNTER — Ambulatory Visit (INDEPENDENT_AMBULATORY_CARE_PROVIDER_SITE_OTHER): Payer: Medicare Other | Admitting: Family Medicine

## 2012-05-19 VITALS — BP 122/66 | HR 59 | Temp 98.5°F | Ht 64.0 in | Wt 126.8 lb

## 2012-05-19 DIAGNOSIS — G43909 Migraine, unspecified, not intractable, without status migrainosus: Secondary | ICD-10-CM

## 2012-05-19 DIAGNOSIS — E039 Hypothyroidism, unspecified: Secondary | ICD-10-CM

## 2012-05-19 DIAGNOSIS — Z1231 Encounter for screening mammogram for malignant neoplasm of breast: Secondary | ICD-10-CM

## 2012-05-19 DIAGNOSIS — Z0000 Encounter for general adult medical examination without abnormal findings: Secondary | ICD-10-CM

## 2012-05-19 DIAGNOSIS — E785 Hyperlipidemia, unspecified: Secondary | ICD-10-CM

## 2012-05-19 DIAGNOSIS — G4700 Insomnia, unspecified: Secondary | ICD-10-CM

## 2012-05-19 DIAGNOSIS — I4891 Unspecified atrial fibrillation: Secondary | ICD-10-CM

## 2012-05-19 MED ORDER — TRAMADOL HCL 50 MG PO TABS: 50.0000 mg | ORAL_TABLET | Freq: Four times a day (QID) | ORAL | Status: DC | PRN

## 2012-05-19 MED ORDER — SIMVASTATIN 20 MG PO TABS: 20.0000 mg | ORAL_TABLET | Freq: Every day | ORAL | Status: DC

## 2012-05-19 MED ORDER — ALPRAZOLAM 0.5 MG PO TABS: 0.5000 mg | ORAL_TABLET | Freq: Every evening | ORAL | Status: DC | PRN

## 2012-05-19 MED ORDER — LEVOTHYROXINE SODIUM 50 MCG PO TABS: 50.0000 ug | ORAL_TABLET | Freq: Every day | ORAL | Status: DC

## 2012-05-19 NOTE — Patient Instructions (Addendum)
Recheck in 1 year, sooner if needed.  If the migraines get worse, notify me.  You can cut the simvastatin in half if needed for aches.   Take care.  Glad to see you.

## 2012-05-20 NOTE — Assessment & Plan Note (Signed)
No known return of AF.

## 2012-05-20 NOTE — Assessment & Plan Note (Signed)
Controlled, continue current meds. TSH wnl

## 2012-05-20 NOTE — Progress Notes (Signed)
I have personally reviewed the Medicare Annual Wellness questionnaire and have noted 1. The patient's medical and social history 2. Their use of alcohol, tobacco or illicit drugs 3. Their current medications and supplements 4. The patient's functional ability including ADL's, fall risks, home safety risks and hearing or visual             impairment. 5. Diet and physical activities 6. Evidence for depression or mood disorders  The patients weight, height, BMI have been recorded in the chart and visual acuity is per eye clinic.  I have made referrals, counseling and provided education to the patient based review of the above and I have provided the pt with a written personalized care plan for preventive services.  See scanned forms.  Routine anticipatory guidance given to patient.  See health maintenance. Flu 2013 Shingles 2010 PNA 2013 Tetanus 2013 Colonoscopy 2012 Breast cancer screening-mammogram due in 3/14 Advance directive d/w pt.  Her son Jorja Loa in [place] Cognitive function addressed- see scanned forms- and if abnormal then additional documentation follows.  DXA not yet due until ~2015  Migraines, still with weather changes. Tolerable with current meds.  No ADE.  We talked about topamax today.  She didn't want to start this yet.   H/o AF during colonoscopy, not since.  No CP, SOB, palpitations.   Elevated Cholesterol: Using medications without problems:yes Muscle aches: no Diet compliance:yes Exercise:some  Insomnia, controlled with prn BZD w/o ADE.  Able to sleep well with current med.   Hypothyroid.  TSH wnl.  No neck mass. No dysphagia.     PMH and SH reviewed  Meds, vitals, and allergies reviewed.   ROS: See HPI.  Otherwise negative.    GEN: nad, alert and oriented HEENT: mucous membranes moist NECK: supple w/o LA, no TMG CV: rrr. PULM: ctab, no inc wob ABD: soft, +bs EXT: no edema SKIN: no acute rash

## 2012-05-20 NOTE — Assessment & Plan Note (Signed)
Controlled, continue current meds.  No ADE.

## 2012-05-20 NOTE — Assessment & Plan Note (Signed)
Controlled, continue current meds.  No ADE.   

## 2012-05-20 NOTE — Assessment & Plan Note (Signed)
See scanned forms.  Routine anticipatory guidance given to patient.  See health maintenance. Flu 2013 Shingles 2010 PNA 2013 Tetanus 2013 Colonoscopy 2012 Breast cancer screening-mammogram due in 3/14 Advance directive d/w pt.  Her son Jorja Loa in [place] Cognitive function addressed- see scanned forms- and if abnormal then additional documentation follows.  DXA not yet due until ~2015

## 2012-05-20 NOTE — Assessment & Plan Note (Signed)
Continue current meds, consider topamax in the future.

## 2012-07-10 ENCOUNTER — Encounter: Payer: Self-pay | Admitting: Family Medicine

## 2012-07-10 ENCOUNTER — Encounter: Payer: Self-pay | Admitting: *Deleted

## 2012-07-10 ENCOUNTER — Ambulatory Visit: Payer: Self-pay | Admitting: Family Medicine

## 2012-11-04 ENCOUNTER — Ambulatory Visit (INDEPENDENT_AMBULATORY_CARE_PROVIDER_SITE_OTHER): Payer: Self-pay | Admitting: Cardiovascular Disease

## 2012-11-04 ENCOUNTER — Encounter: Payer: Self-pay | Admitting: Cardiovascular Disease

## 2012-11-04 VITALS — BP 118/72 | HR 52 | Ht 64.0 in | Wt 120.5 lb

## 2012-11-04 DIAGNOSIS — E785 Hyperlipidemia, unspecified: Secondary | ICD-10-CM

## 2012-11-04 DIAGNOSIS — R002 Palpitations: Secondary | ICD-10-CM

## 2012-11-04 DIAGNOSIS — I6529 Occlusion and stenosis of unspecified carotid artery: Secondary | ICD-10-CM | POA: Insufficient documentation

## 2012-11-04 DIAGNOSIS — I4891 Unspecified atrial fibrillation: Secondary | ICD-10-CM

## 2012-11-04 DIAGNOSIS — F17200 Nicotine dependence, unspecified, uncomplicated: Secondary | ICD-10-CM

## 2012-11-04 NOTE — Assessment & Plan Note (Signed)
She is tolerating simvastatin 10 mg daily. She had myalgias on 20 mg daily

## 2012-11-04 NOTE — Progress Notes (Signed)
Patient ID: Vanessa [name], female    DOB: 05-29-1934, 78 y.o.   MRN: [id number]  HPI Comments: Vanessa [name] is a very pleasant 78 year old woman with a hx of atrial fibrillation prior to her colonoscopy, h/o severe migraines at times, history of hyperlipidemia and hypothyroidism. She continues to smoke 2 cigarettes a day which she started after she lost her husband. She presents for routine followup. 2006 she reports having carotid ultrasound with mild to moderate disease.  She reports that she has rare episodes of palpitations and tachycardia.  She had one episode after moving heavy furniture. This did not last more than several minutes. Heart rate by her blood pressure cuff was in the 145-150 range. She did deep breathing and symptoms eventually resolved. She had to lie down on the bed. Otherwise she feels well with no further tachycardia or palpitations. She's only taking her metoprolol as needed as baseline heart rate is very low She continues to smoke several cigarettes per day  EKG today shows Sinus bradycardia with rate 52 beats per minute, no significant ST or T wave changes    Outpatient Encounter Prescriptions as of 11/04/2012  Medication Sig Dispense Refill  . ALPRAZolam (XANAX) 0.5 MG tablet Take 1-2 tablets (0.5-1 mg total) by mouth at bedtime as needed.  180 tablet  1  . aspirin 81 MG EC tablet Take 81 mg by mouth daily.        Marland Kitchen aspirin-acetaminophen-caffeine (EXCEDRIN MIGRAINE) 250-250-65 MG per tablet Take 1 tablet by mouth every 6 (six) hours as needed.        . Calcium Carbonate-Vitamin D (CALCIUM 600 + D PO) Take 600 mg by mouth 2 (two) times daily.        . Cholecalciferol (VITAMIN D PO) Take by mouth daily.        . fish oil-omega-3 fatty acids 1000 MG capsule Take 1 g by mouth daily.        Marland Kitchen levothyroxine (SYNTHROID, LEVOTHROID) 50 MCG tablet Take 1 tablet (50 mcg total) by mouth daily.  90 tablet  3  . metoprolol tartrate (LOPRESSOR) 12.5 mg TABS Take 6.25 mg by  mouth daily as needed. Take 1/4 tablet before procedures due to h/o A fib during colonsocopy      . simvastatin (ZOCOR) 20 MG tablet Take 10 mg by mouth at bedtime.      . Thiamine HCl (VITAMIN B-1) 100 MG tablet Take 100 mg by mouth daily.        . traMADol (ULTRAM) 50 MG tablet Take 1 tablet (50 mg total) by mouth every 6 (six) hours as needed.  120 tablet  12  . vitamin B-12 (CYANOCOBALAMIN) 1000 MCG tablet Take 1,000 mcg by mouth daily.        . vitamin C (ASCORBIC ACID) 500 MG tablet Take 500 mg by mouth daily.        . [DISCONTINUED] simvastatin (ZOCOR) 20 MG tablet Take 1 tablet (20 mg total) by mouth at bedtime.  90 tablet  3   No facility-administered encounter medications on file as of 11/04/2012.    Review of Systems  HENT: Negative.   Eyes: Negative.   Respiratory: Negative.   Cardiovascular: Positive for palpitations.  Gastrointestinal: Negative.   Musculoskeletal: Negative.   Skin: Negative.   Neurological: Negative.   Psychiatric/Behavioral: Negative.   All other systems reviewed and are negative.    BP 118/72  Pulse 52  Ht 5\' 4"  (1.626 m)  Wt 120 lb 8 oz (  54.658 kg)  BMI 20.67 kg/m2  Physical Exam  Nursing note and vitals reviewed. Constitutional: She is oriented to person, place, and time. She appears well-developed and well-nourished.  HENT:  Head: Normocephalic.  Nose: Nose normal.  Mouth/Throat: Oropharynx is clear and moist.  Eyes: Conjunctivae are normal. Pupils are equal, round, and reactive to light.  Neck: Normal range of motion. Neck supple. No JVD present.  Cardiovascular: Regular rhythm, S1 normal, S2 normal and intact distal pulses.  Bradycardia present.  Exam reveals no gallop and no friction rub.   Murmur heard.  Crescendo systolic murmur is present with a grade of 2/6  Pulmonary/Chest: Effort normal and breath sounds normal. No respiratory distress. She has no wheezes. She has no rales. She exhibits no tenderness.  Abdominal: Soft. Bowel  sounds are normal. She exhibits no distension. There is no tenderness.  Musculoskeletal: Normal range of motion. She exhibits no edema and no tenderness.  Lymphadenopathy:    She has no cervical adenopathy.  Neurological: She is alert and oriented to person, place, and time. Coordination normal.  Skin: Skin is warm and dry. No rash noted. No erythema.  Psychiatric: She has a normal mood and affect. Her behavior is normal. Judgment and thought content normal.    Assessment and Plan

## 2012-11-04 NOTE — Assessment & Plan Note (Signed)
She reported having ultrasound of her carotids in 2006 that showed mild to moderate disease. She would like to wait on repeat carotid ultrasound. We'll discuss this with her in early 2015. This could be done through our office if needed.

## 2012-11-04 NOTE — Patient Instructions (Addendum)
You are doing well. No medication changes were made.  We will talk next year about repeat carotid ultrasound Call the office for more atrial fibrillation  Please call us if you have new issues that need to be addressed before your next appt.  Your physician wants you to follow-up in: 12 months.  You will receive a reminder letter in the mail two months in advance. If you don't receive a letter, please call our office to schedule the follow-up appointment.

## 2012-11-04 NOTE — Assessment & Plan Note (Signed)
We have encouraged her to continue to work on weaning her cigarettes and smoking cessation. She will continue to work on this and does not want any assistance with chantix.  

## 2012-11-04 NOTE — Assessment & Plan Note (Signed)
Rare episodes of atrial fibrillation. I suggested if she has worsening arrhythmia that she call our office. Antiarrhythmics could be used. She would not tolerate diltiazem or beta blockers given her baseline bradycardia.

## 2012-12-17 ENCOUNTER — Other Ambulatory Visit: Payer: Self-pay | Admitting: *Deleted

## 2012-12-17 MED ORDER — TRAMADOL HCL 50 MG PO TABS: 50.0000 mg | ORAL_TABLET | Freq: Four times a day (QID) | ORAL | Status: DC | PRN

## 2012-12-17 NOTE — Telephone Encounter (Signed)
Rx called in to pharmacy, for #120 with 5 refills. The remainder of her rx written on 05/16/12.

## 2013-05-08 ENCOUNTER — Other Ambulatory Visit: Payer: Self-pay | Admitting: Family Medicine

## 2013-05-08 DIAGNOSIS — E785 Hyperlipidemia, unspecified: Secondary | ICD-10-CM

## 2013-05-08 DIAGNOSIS — M8580 Other specified disorders of bone density and structure, unspecified site: Secondary | ICD-10-CM

## 2013-05-08 DIAGNOSIS — I4891 Unspecified atrial fibrillation: Secondary | ICD-10-CM

## 2013-05-13 ENCOUNTER — Other Ambulatory Visit (INDEPENDENT_AMBULATORY_CARE_PROVIDER_SITE_OTHER): Payer: Medicare HMO

## 2013-05-13 DIAGNOSIS — E785 Hyperlipidemia, unspecified: Secondary | ICD-10-CM

## 2013-05-13 DIAGNOSIS — I4891 Unspecified atrial fibrillation: Secondary | ICD-10-CM

## 2013-05-13 DIAGNOSIS — M949 Disorder of cartilage, unspecified: Secondary | ICD-10-CM

## 2013-05-13 DIAGNOSIS — M8580 Other specified disorders of bone density and structure, unspecified site: Secondary | ICD-10-CM

## 2013-05-13 DIAGNOSIS — E039 Hypothyroidism, unspecified: Secondary | ICD-10-CM

## 2013-05-13 DIAGNOSIS — M899 Disorder of bone, unspecified: Secondary | ICD-10-CM

## 2013-05-13 LAB — CBC WITH DIFFERENTIAL/PLATELET
BASOS ABS: 0 10*3/uL (ref 0.0–0.1)
Basophils Relative: 0.6 % (ref 0.0–3.0)
EOS ABS: 0.5 10*3/uL (ref 0.0–0.7)
Eosinophils Relative: 8.2 % — ABNORMAL HIGH (ref 0.0–5.0)
HCT: 44.1 % (ref 36.0–46.0)
HEMOGLOBIN: 15.2 g/dL — AB (ref 12.0–15.0)
LYMPHS PCT: 24.4 % (ref 12.0–46.0)
Lymphs Abs: 1.6 10*3/uL (ref 0.7–4.0)
MCHC: 34.4 g/dL (ref 30.0–36.0)
MCV: 93.8 fl (ref 78.0–100.0)
Monocytes Absolute: 0.4 10*3/uL (ref 0.1–1.0)
Monocytes Relative: 5.7 % (ref 3.0–12.0)
NEUTROS ABS: 4 10*3/uL (ref 1.4–7.7)
NEUTROS PCT: 61.1 % (ref 43.0–77.0)
PLATELETS: 198 10*3/uL (ref 150.0–400.0)
RBC: 4.7 Mil/uL (ref 3.87–5.11)
RDW: 12.6 % (ref 11.5–14.6)
WBC: 6.6 10*3/uL (ref 4.5–10.5)

## 2013-05-13 LAB — COMPREHENSIVE METABOLIC PANEL
ALBUMIN: 4.1 g/dL (ref 3.5–5.2)
ALK PHOS: 34 U/L — AB (ref 39–117)
ALT: 15 U/L (ref 0–35)
AST: 28 U/L (ref 0–37)
BUN: 12 mg/dL (ref 6–23)
CHLORIDE: 104 meq/L (ref 96–112)
CO2: 28 meq/L (ref 19–32)
Calcium: 9.5 mg/dL (ref 8.4–10.5)
Creatinine, Ser: 0.9 mg/dL (ref 0.4–1.2)
GFR: 65.44 mL/min (ref >60.00)
GLUCOSE: 85 mg/dL (ref 70–99)
POTASSIUM: 3.9 meq/L (ref 3.5–5.1)
SODIUM: 140 meq/L (ref 135–145)
TOTAL PROTEIN: 7.1 g/dL (ref 6.0–8.3)
Total Bilirubin: 0.8 mg/dL (ref 0.3–1.2)

## 2013-05-13 LAB — LIPID PANEL
Cholesterol: 152 mg/dL (ref 0–200)
HDL: 62.2 mg/dL (ref >39.00)
LDL CALC: 75 mg/dL (ref 0–99)
TRIGLYCERIDES: 74 mg/dL (ref 0.0–149.0)
Total CHOL/HDL Ratio: 2
VLDL: 14.8 mg/dL (ref 0.0–40.0)

## 2013-05-13 LAB — TSH: TSH: 1.59 u[IU]/mL (ref 0.35–5.50)

## 2013-05-14 LAB — VITAMIN D 25 HYDROXY (VIT D DEFICIENCY, FRACTURES): Vit D, 25-Hydroxy: 60 ng/mL (ref 30–89)

## 2013-05-19 ENCOUNTER — Encounter: Payer: Medicare Other | Admitting: Family Medicine

## 2013-05-20 ENCOUNTER — Encounter: Payer: Self-pay | Admitting: Internal Medicine

## 2013-05-20 ENCOUNTER — Telehealth: Payer: Self-pay

## 2013-05-20 ENCOUNTER — Ambulatory Visit (INDEPENDENT_AMBULATORY_CARE_PROVIDER_SITE_OTHER): Payer: Medicare HMO | Admitting: Internal Medicine

## 2013-05-20 VITALS — BP 114/72 | HR 62 | Temp 98.4°F | Ht 62.75 in | Wt 115.0 lb

## 2013-05-20 DIAGNOSIS — Z0000 Encounter for general adult medical examination without abnormal findings: Secondary | ICD-10-CM

## 2013-05-20 DIAGNOSIS — IMO0001 Reserved for inherently not codable concepts without codable children: Secondary | ICD-10-CM

## 2013-05-20 DIAGNOSIS — F17200 Nicotine dependence, unspecified, uncomplicated: Secondary | ICD-10-CM

## 2013-05-20 DIAGNOSIS — M899 Disorder of bone, unspecified: Secondary | ICD-10-CM

## 2013-05-20 DIAGNOSIS — G4700 Insomnia, unspecified: Secondary | ICD-10-CM

## 2013-05-20 DIAGNOSIS — M8580 Other specified disorders of bone density and structure, unspecified site: Secondary | ICD-10-CM

## 2013-05-20 DIAGNOSIS — M949 Disorder of cartilage, unspecified: Secondary | ICD-10-CM

## 2013-05-20 DIAGNOSIS — E785 Hyperlipidemia, unspecified: Secondary | ICD-10-CM

## 2013-05-20 DIAGNOSIS — E039 Hypothyroidism, unspecified: Secondary | ICD-10-CM

## 2013-05-20 DIAGNOSIS — G43909 Migraine, unspecified, not intractable, without status migrainosus: Secondary | ICD-10-CM

## 2013-05-20 MED ORDER — LEVOTHYROXINE SODIUM 50 MCG PO TABS: 50.0000 ug | ORAL_TABLET | Freq: Every day | ORAL | Status: DC

## 2013-05-20 MED ORDER — TRAMADOL HCL 50 MG PO TABS: 50.0000 mg | ORAL_TABLET | Freq: Four times a day (QID) | ORAL | Status: DC | PRN

## 2013-05-20 MED ORDER — SIMVASTATIN 20 MG PO TABS: 10.0000 mg | ORAL_TABLET | Freq: Every day | ORAL | Status: DC

## 2013-05-20 NOTE — Assessment & Plan Note (Signed)
Cessation discussed- pt declines at this time.

## 2013-05-20 NOTE — Assessment & Plan Note (Signed)
Will order bone scan today

## 2013-05-20 NOTE — Progress Notes (Signed)
HPI: Pt presents to the clinic today for her annual wellness exam. She has no concerns today  HLD: No issues on simvistatin. Denies myalgias on the reduced dose. Does have joint pain but thinks it is related to arthritis. Takes fish oil daily. Lipid profile reviewed today.  Migraines: Takes excedrin migraine. It seems to be worse in colder weather. The medication seems to be effective, even more so when taken with xanax. She request the RX be changed to BID instead of QHS.  Insomnia: Takes an occasional xanax at night to help her sleep. This seems to be effective.  Hypothyroid: Well controlled on synthroid. TSH reviewed today.  Arthritis: Relieved with occasional tramadol.  Past Medical History  Diagnosis Date  . Hyperlipidemia   . Osteopenia     prev with 10 years of fosamax and intolerant of evista  . Migraines     without aura, sx since age 79  . Insomnia   . Hypothyroidism   . AF (atrial fibrillation)     during colonoscopy  . Anemia     treated ~2009 with iron, resolved  . Arthritis   . Cataract     surgery on R catarct 2012  . Hyperglycemia     mild inc in fasting sugar    Current Outpatient Prescriptions  Medication Sig Dispense Refill  . ALPRAZolam (XANAX) 0.5 MG tablet Take 1-2 tablets (0.5-1 mg total) by mouth at bedtime as needed.  180 tablet  1  . aspirin 81 MG EC tablet Take 81 mg by mouth daily.        Marland Kitchen. aspirin-acetaminophen-caffeine (EXCEDRIN MIGRAINE) 250-250-65 MG per tablet Take 1 tablet by mouth every 6 (six) hours as needed.        . Calcium Carbonate-Vitamin D (CALCIUM 600 + D PO) Take 600 mg by mouth 2 (two) times daily.        . Cholecalciferol (VITAMIN D PO) Take by mouth daily.        . fish oil-omega-3 fatty acids 1000 MG capsule Take 1 g by mouth daily.        Marland Kitchen. levothyroxine (SYNTHROID, LEVOTHROID) 50 MCG tablet Take 1 tablet (50 mcg total) by mouth daily.  90 tablet  3  . simvastatin (ZOCOR) 20 MG tablet Take 10 mg by mouth at bedtime.      .  Thiamine HCl (VITAMIN B-1) 100 MG tablet Take 100 mg by mouth daily.        . traMADol (ULTRAM) 50 MG tablet Take 1 tablet (50 mg total) by mouth every 6 (six) hours as needed.  120 tablet  5  . vitamin B-12 (CYANOCOBALAMIN) 1000 MCG tablet Take 1,000 mcg by mouth daily.        . vitamin C (ASCORBIC ACID) 500 MG tablet Take 500 mg by mouth daily.        . metoprolol tartrate (LOPRESSOR) 12.5 mg TABS Take 6.25 mg by mouth daily as needed. Take 1/4 tablet before procedures due to h/o A fib during colonsocopy       No current facility-administered medications for this visit.    Allergies  Allergen Reactions  . Celebrex [Celecoxib]     GI intolerance (but tolerates aspirin)  . Codeine   . Cortisone   . Evista [Raloxifene Hydrochloride]     pain  . Naproxen     Family History  Problem Relation Age of Onset  . Diabetes Father   . Migraines Mother   . Stroke Mother  TIAs    History   Social History  . Marital Status: Married    Spouse Name: N/A    Number of Children: N/A  . Years of Education: N/A   Occupational History  . Not on file.   Social History Main Topics  . Smoking status: Current Every Day Smoker -- 0.08 packs/day for 6 years    Types: Cigarettes  . Smokeless tobacco: Never Used     Comment: 1-2 cigarettes daily  . Alcohol Use: No  . Drug Use: No  . Sexual Activity: Not on file   Other Topics Concern  . Not on file   Social History Narrative   From [place].     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  Was married for 47 years.     Active in church and senior groups.     Likes gospel singing.     Lives alone.     4 kids, all are nearby.      Hospitiliaztions: None  Health Maintenance:    Flu: 01/21/2013  Tetanus: 01/07/2012  Pneumovax: 05/14/2011  Zostavax: 2013  Mammogram: 05/19/2012  Pap Smear: 2011  Bone Density: 2013, due this year  Colonoscopy: 07/30/2010  Eye Doctor: 02/12/2013  Dental Exam: no- dentures    I have personally  reviewed and have noted:  1. The patient's medical and social history 2. Their use of alcohol, tobacco or illicit drugs 3. Their current medications and supplements 4. The patient's functional ability including ADL's, fall  risks, home safety risks and hearing or visual  impairment. 5. Diet and physical activities 6. Evidence for depression or mood disorders  Subjective:   Review of Systems:   Constitutional: Denies fever, malaise, fatigue, headache or abrupt weight changes.  HEENT: Denies eye pain, eye redness, ear pain, ringing in the ears, wax buildup, runny nose, nasal congestion, bloody nose, or sore throat. Respiratory: Denies difficulty breathing, shortness of breath, cough or sputum production.   Cardiovascular: Denies chest pain, chest tightness, palpitations or swelling in the hands or feet.  Gastrointestinal: Denies abdominal pain, bloating, constipation, diarrhea or blood in the stool.  GU: Denies urgency, frequency, pain with urination, burning sensation, blood in urine, odor or discharge. Musculoskeletal: Denies decrease in range of motion, difficulty with gait, muscle pain or joint pain and swelling.  Skin: Denies redness, rashes, lesions or ulcercations.  Neurological: Denies dizziness, difficulty with memory, difficulty with speech or problems with balance and coordination.   No other specific complaints in a complete review of systems (except as listed in HPI above).  Objective:  PE:   BP 114/72  Pulse 62  Temp(Src) 98.4 F (36.9 C) (Oral)  Ht 5' 2.75" (1.594 m)  Wt 115 lb (52.164 kg)  BMI 20.53 kg/m2  SpO2 97% Wt Readings from Last 3 Encounters:  05/20/13 115 lb (52.164 kg)  11/04/12 120 lb 8 oz (54.658 kg)  05/19/12 126 lb 12 oz (57.493 kg)    General: Appears her stated age, well developed, well nourished in NAD. Skin: Warm, dry and intact. No rashes, lesions or ulcerations noted. HEENT: Head: normal shape and size; Eyes: sclera white, no icterus,  conjunctiva pink, PERRLA and EOMs intact; Ears: Tm's gray and intact, normal light reflex; Nose: mucosa pink and moist, septum midline; Throat/Mouth: Teeth present, mucosa pink and moist, no exudate, lesions or ulcerations noted.  Neck: Normal range of motion. Neck supple, trachea midline. No massses, lumps or thyromegaly present.  Cardiovascular: Normal rate and  rhythm. S1,S2 noted.  No murmur, rubs or gallops noted. No JVD or BLE edema. No carotid bruits noted. Pulmonary/Chest: Normal effort and positive vesicular breath sounds. No respiratory distress. No wheezes, rales or ronchi noted.  Abdomen: Soft and nontender. Normal bowel sounds, no bruits noted. No distention or masses noted. Liver, spleen and kidneys non palpable. Musculoskeletal: Normal range of motion. No signs of joint swelling. No difficulty with gait.  Neurological: Alert and oriented. Cranial nerves II-XII intact. Coordination normal. +DTRs bilaterally. Psychiatric: Mood tearful and affect normal. Behavior is normal. Judgment and thought content normal.    BMET    Component Value Date/Time   NA 140 05/13/2013 0912   K 3.9 05/13/2013 0912   CL 104 05/13/2013 0912   CO2 28 05/13/2013 0912   GLUCOSE 85 05/13/2013 0912   BUN 12 05/13/2013 0912   CREATININE 0.9 05/13/2013 0912   CALCIUM 9.5 05/13/2013 0912    Lipid Panel     Component Value Date/Time   CHOL 152 05/13/2013 0912   TRIG 74.0 05/13/2013 0912   HDL 62.20 05/13/2013 0912   CHOLHDL 2 05/13/2013 0912   VLDL 14.8 05/13/2013 0912   LDLCALC 75 05/13/2013 0912    CBC    Component Value Date/Time   WBC 6.6 05/13/2013 0912   RBC 4.70 05/13/2013 0912   HGB 15.2* 05/13/2013 0912   HCT 44.1 05/13/2013 0912   PLT 198.0 05/13/2013 0912   MCV 93.8 05/13/2013 0912   MCHC 34.4 05/13/2013 0912   RDW 12.6 05/13/2013 0912   LYMPHSABS 1.6 05/13/2013 0912   MONOABS 0.4 05/13/2013 0912   EOSABS 0.5 05/13/2013 0912   BASOSABS 0.0 05/13/2013 0912    Hgb A1C No results found for this  basename: HGBA1C      Assessment and Plan:   Medicare Annual Wellness Visit:  Diet: Heart healthy  Physical activity: walks occassionally Depression/mood screen: Pt tearful today- declines treatment today. Hearing: Intact to whispered voice Visual acuity: Grossly normal, performs annual eye exam  ADLs: Capable Fall risk: None Home safety: Good Cognitive evaluation: Intact to orientation, naming, recall and repetition EOL planning: Adv directives intact, pt wishes to be a DNR. I agree.  Preventative Medicine:  Disscussed smoking cessation- she is not motivated to quit at this time Will order bone density scan at this time  Next appointment: 1 year

## 2013-05-20 NOTE — Assessment & Plan Note (Signed)
Well controlled synthroid Labs reviewed today

## 2013-05-20 NOTE — Progress Notes (Signed)
Pre-visit discussion using our clinic review tool. No additional management support is needed unless otherwise documented below in the visit note.  

## 2013-05-20 NOTE — Patient Instructions (Signed)

## 2013-05-20 NOTE — Assessment & Plan Note (Signed)
No myalgias on reduced dose Lipid profile reviewed today LFTs normal Continue simvistatin

## 2013-05-20 NOTE — Telephone Encounter (Signed)
Called in Rx for Tramadol 

## 2013-05-20 NOTE — Assessment & Plan Note (Signed)
Worse this time of year Continue excedrin migraine Will discuss the xaxax with Dr. Para Marchuncan and get back to you

## 2013-05-20 NOTE — Assessment & Plan Note (Signed)
Well controlled on xanax

## 2013-05-20 NOTE — Progress Notes (Signed)
HPI:: Pt presents today for annual Medicare wellness exam.  I have personally reviewed the Medicare Annual Wellness questionnaire and have noted  1. The patient'[name] medical and social history  2. Their use of alcohol, tobacco or illicit drugs  3. Their current medications and supplements  4. The patient'[name] functional ability including ADL'[name], fall risks, home safety risks and hearing or visual  impairment.  5. Diet and physical activities  6. Evidence for depression or mood disorders  Health Maintenance: Flu 2014 Shingles 2010  PNA 2013  Tetanus 2013  Colonoscopy 2012  Breast cancer screening-mammogram due in 3/15 Advance directive d/w pt. Her son Jorja Loa in [place]  Cognitive function addressed- see scanned forms- and if abnormal then additional documentation follows.  DXA not yet due until ~2015 Bone density Scan:2013 Eye Exam: oct 2014 Dental exam: dentures  Migraines: Still having some issues, not as violent, maintenance program working, tramadol and Excedrin migraine helps; worse in colder weather  Hyperlipidemia: lab work normal; continue fish oil and 10mg  simvastatin  Insomnia: sleeping ok; does better with xanax.   Hypothyroidism: tsh level normal, continue synthroid  Atrial Fibrillation: only one occurences, just with colonoscopy or other procedures, has metoprolol if needed  Arthritis: doing well, no complaints. Heat/ice if needed     Past Medical History  Diagnosis Date  . Hyperlipidemia   . Osteopenia     prev with 10 years of fosamax and intolerant of evista  . Migraines     without aura, sx since age 47  . Insomnia   . Hypothyroidism   . AF (atrial fibrillation)     during colonoscopy  . Anemia     treated ~2009 with iron, resolved  . Arthritis   . Cataract     surgery on R catarct 2012  . Hyperglycemia     mild inc in fasting sugar    Current Outpatient Prescriptions  Medication Sig Dispense Refill  . ALPRAZolam (XANAX) 0.5 MG tablet Take 1-2 tablets  (0.5-1 mg total) by mouth at bedtime as needed.  180 tablet  1  . aspirin 81 MG EC tablet Take 81 mg by mouth daily.        Marland Kitchen aspirin-acetaminophen-caffeine (EXCEDRIN MIGRAINE) 250-250-65 MG per tablet Take 1 tablet by mouth every 6 (six) hours as needed.        . Calcium Carbonate-Vitamin D (CALCIUM 600 + D PO) Take 600 mg by mouth 2 (two) times daily.        . Cholecalciferol (VITAMIN D PO) Take by mouth daily.        . fish oil-omega-3 fatty acids 1000 MG capsule Take 1 g by mouth daily.        Marland Kitchen levothyroxine (SYNTHROID, LEVOTHROID) 50 MCG tablet Take 1 tablet (50 mcg total) by mouth daily.  90 tablet  3  . metoprolol tartrate (LOPRESSOR) 12.5 mg TABS Take 6.25 mg by mouth daily as needed. Take 1/4 tablet before procedures due to h/o A fib during colonsocopy      . simvastatin (ZOCOR) 20 MG tablet Take 10 mg by mouth at bedtime.      . Thiamine HCl (VITAMIN B-1) 100 MG tablet Take 100 mg by mouth daily.        . traMADol (ULTRAM) 50 MG tablet Take 1 tablet (50 mg total) by mouth every 6 (six) hours as needed.  120 tablet  5  . vitamin B-12 (CYANOCOBALAMIN) 1000 MCG tablet Take 1,000 mcg by mouth daily.        Marland Kitchen  vitamin C (ASCORBIC ACID) 500 MG tablet Take 500 mg by mouth daily.         No current facility-administered medications for this visit.    Allergies  Allergen Reactions  . Celebrex [Celecoxib]     GI intolerance (but tolerates aspirin)  . Codeine   . Cortisone   . Evista [Raloxifene Hydrochloride]     pain  . Naproxen     Family History  Problem Relation Age of Onset  . Diabetes Father   . Migraines Mother   . Stroke Mother     TIAs    History   Social History  . Marital Status: Married    Spouse Name: N/A    Number of Children: N/A  . Years of Education: N/A   Occupational History  . Not on file.   Social History Main Topics  . Smoking status: Current Every Day Smoker -- 0.08 packs/day for 6 years    Types: Cigarettes  . Smokeless tobacco: Never Used  .  Alcohol Use: No  . Drug Use: No  . Sexual Activity: Not on file   Other Topics Concern  . Not on file   Social History Narrative   From [place].     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  Was married for 47 years.     Active in church and senior groups.     Likes gospel singing.     Lives alone.     4 kids, all are nearby.      Hospitiliaztions: None  Review of Systems:   Constitutional: Denies fever, malaise, fatigue, headache or abrupt weight changes.  HEENT: Denies eye pain, eye redness, ear pain, ringing in the ears, wax buildup, runny nose, nasal congestion, bloody nose, or sore throat. Respiratory: Denies difficulty breathing, shortness of breath, cough or sputum production.   Cardiovascular: Denies chest pain, chest tightness, palpitations or swelling in the hands or feet.  Gastrointestinal: Denies abdominal pain, bloating, constipation, diarrhea or blood in the stool.  GU: Denies urgency, frequency, pain with urination, burning sensation, blood in urine, odor or discharge. Musculoskeletal: Denies decrease in range of motion, difficulty with gait, muscle pain or joint pain and swelling.  Skin: Denies redness, rashes, lesions or ulcercations.  Neurological: Denies dizziness, difficulty with memory, difficulty with speech or problems with balance and coordination.   No other specific complaints in a complete review of systems (except as listed in HPI above).  Objective:  PE:   Ht 5' 2.75" (1.594 m)  Wt 115 lb (52.164 kg)  BMI 20.53 kg/m2 Wt Readings from Last 3 Encounters:  05/20/13 115 lb (52.164 kg)  11/04/12 120 lb 8 oz (54.658 kg)  05/19/12 126 lb 12 oz (57.493 kg)    General: Appears their stated age, well developed, well nourished in NAD. Skin: Warm, dry and intact. No rashes, lesions or ulcerations noted. HEENT: Head: normal shape and size; Eyes: sclera white, no icterus, conjunctiva pink, PERRLA and EOMs intact; Ears: Tm'[name] gray and intact, normal  light reflex; Nose: mucosa pink and moist, septum midline; Throat/Mouth: Teeth present, mucosa pink and moist, no exudate, lesions or ulcerations noted.  Neck: Normal range of motion. Neck supple, trachea midline. No massses, lumps or thyromegaly present.  Cardiovascular: Normal rate and rhythm. S1,S2 noted.  No murmur, rubs or gallops noted. No JVD or BLE edema. No carotid bruits noted. Pulmonary/Chest: Normal effort and positive vesicular breath sounds. No respiratory distress. No wheezes, rales or ronchi  noted.  Abdomen: Soft and nontender. Normal bowel sounds, no bruits noted. No distention or masses noted. Liver, spleen and kidneys non palpable. Musculoskeletal: Normal range of motion. No signs of joint swelling. No difficulty with gait.  Neurological: Alert and oriented. Cranial nerves II-XII intact. Coordination normal. +DTRs bilaterally. Psychiatric: Mood and affect normal. Behavior is normal. Judgment and thought content normal.   EKG:  BMET    Component Value Date/Time   NA 140 05/13/2013 0912   K 3.9 05/13/2013 0912   CL 104 05/13/2013 0912   CO2 28 05/13/2013 0912   GLUCOSE 85 05/13/2013 0912   BUN 12 05/13/2013 0912   CREATININE 0.9 05/13/2013 0912   CALCIUM 9.5 05/13/2013 0912    Lipid Panel     Component Value Date/Time   CHOL 152 05/13/2013 0912   TRIG 74.0 05/13/2013 0912   HDL 62.20 05/13/2013 0912   CHOLHDL 2 05/13/2013 0912   VLDL 14.8 05/13/2013 0912   LDLCALC 75 05/13/2013 0912    CBC    Component Value Date/Time   WBC 6.6 05/13/2013 0912   RBC 4.70 05/13/2013 0912   HGB 15.2* 05/13/2013 0912   HCT 44.1 05/13/2013 0912   PLT 198.0 05/13/2013 0912   MCV 93.8 05/13/2013 0912   MCHC 34.4 05/13/2013 0912   RDW 12.6 05/13/2013 0912   LYMPHSABS 1.6 05/13/2013 0912   MONOABS 0.4 05/13/2013 0912   EOSABS 0.5 05/13/2013 0912   BASOSABS 0.0 05/13/2013 0912    Hgb A1C No results found for this basename: HGBA1C      Assessment and Plan:   Medicare Annual Wellness  Visit:  Diet: Vegetarian; no meat or processed foods Physical activity: Exercising daily; walks Depression/mood screen: Tearful when speaking of passed husband; overall okay/lonely; no needs at this time  Hearing: Intact to whispered voice Visual acuity: Grossly normal,some cataracts, awaiting removal; performs annual eye exam  ADLs: Independent Fall risk: None Home safety: Good Cognitive evaluation: Intact to orientation, naming, recall and repetition EOL planning: Adv directives intact, pt agrees and wishes to be a DNR. Agreed with plan  Preventative Medicine: Continue vitamins and current medications as prescribed. Will order bone density scan   Next appointment: one year and as needed  Vanessa [name], Student-NP

## 2013-05-21 ENCOUNTER — Other Ambulatory Visit: Payer: Self-pay

## 2013-05-21 ENCOUNTER — Encounter: Payer: Self-pay | Admitting: Family Medicine

## 2013-05-21 MED ORDER — ALPRAZOLAM 0.5 MG PO TABS: 0.5000 mg | ORAL_TABLET | Freq: Every evening | ORAL | Status: DC | PRN

## 2013-05-21 NOTE — Telephone Encounter (Signed)
Vanessa [name] called to ck on refill for alprazolam. Advised requesting authorization from Dr Para Marchuncan and will cb when approved.

## 2013-05-21 NOTE — Telephone Encounter (Signed)
Pt called for status of alprazolam refill; pt said saw Nicki Reaperegina Baity NP on 05/20/13 but Nicki Reaperegina Baity NP did not refill Alprazolam and send request to Dr Para Marchuncan; pt is out of med and request [facility].Please advise.

## 2013-05-21 NOTE — Telephone Encounter (Signed)
Medication phoned to pharmacy.  

## 2013-05-21 NOTE — Telephone Encounter (Signed)
Please call in.  Thanks.   

## 2013-06-23 ENCOUNTER — Telehealth: Payer: Self-pay

## 2013-06-23 NOTE — Telephone Encounter (Signed)
Last filled 05/20/13--please advise

## 2013-06-24 ENCOUNTER — Telehealth: Payer: Self-pay | Admitting: Family Medicine

## 2013-06-24 NOTE — Telephone Encounter (Signed)
Which medicine?

## 2013-06-24 NOTE — Telephone Encounter (Signed)
The request was for xanax but she should have enough of the prev rx with the refill. Please clarify this.  Thanks.

## 2013-06-24 NOTE — Telephone Encounter (Signed)
I am sorry--her Xanax

## 2013-06-26 ENCOUNTER — Other Ambulatory Visit: Payer: Self-pay | Admitting: *Deleted

## 2013-06-26 DIAGNOSIS — M1990 Unspecified osteoarthritis, unspecified site: Secondary | ICD-10-CM

## 2013-06-26 NOTE — Telephone Encounter (Signed)
Faxed refill request. Please advise.  

## 2013-06-27 DIAGNOSIS — M1990 Unspecified osteoarthritis, unspecified site: Secondary | ICD-10-CM | POA: Insufficient documentation

## 2013-06-27 MED ORDER — TRAMADOL HCL 50 MG PO TABS: 50.0000 mg | ORAL_TABLET | Freq: Four times a day (QID) | ORAL | Status: DC | PRN

## 2013-06-27 NOTE — Telephone Encounter (Signed)
Please call in

## 2013-06-29 NOTE — Telephone Encounter (Signed)
Medication phoned to pharmacy.  

## 2013-08-12 ENCOUNTER — Ambulatory Visit: Payer: Self-pay | Admitting: Family Medicine

## 2013-08-13 ENCOUNTER — Encounter: Payer: Self-pay | Admitting: Family Medicine

## 2013-12-11 ENCOUNTER — Encounter: Payer: Self-pay | Admitting: Family Medicine

## 2013-12-11 ENCOUNTER — Ambulatory Visit (INDEPENDENT_AMBULATORY_CARE_PROVIDER_SITE_OTHER): Payer: Medicare HMO | Admitting: Family Medicine

## 2013-12-11 VITALS — BP 122/78 | HR 98 | Temp 99.0°F | Wt 114.0 lb

## 2013-12-11 DIAGNOSIS — Z9109 Other allergy status, other than to drugs and biological substances: Secondary | ICD-10-CM

## 2013-12-11 DIAGNOSIS — E785 Hyperlipidemia, unspecified: Secondary | ICD-10-CM

## 2013-12-11 MED ORDER — TRAMADOL HCL 50 MG PO TABS: 50.0000 mg | ORAL_TABLET | Freq: Four times a day (QID) | ORAL | Status: DC | PRN

## 2013-12-11 MED ORDER — LORATADINE 10 MG PO TABS: 10.0000 mg | ORAL_TABLET | Freq: Every day | ORAL | Status: DC

## 2013-12-11 NOTE — Progress Notes (Signed)
Pre visit review using our clinic review tool, if applicable. No additional management support is needed unless otherwise documented below in the visit note.  H/o HLD.  D/w pt prev.  After quitting the zocor, her aches got a lot better.  Now resolved off statin.  Discussed.    No sig h/o allergies prior to this year. This year she had some rhinorrhea, cough, after specific environmental exposures (bushes outside).  She does worse when near those, they bloom off an on all summer, they have bloomed more than typical this summer.  No fevers.  No sputum.    Meds, vitals, and allergies reviewed.   ROS: See HPI.  Otherwise, noncontributory.  GEN: nad, alert and oriented HEENT: mucous membranes moist NECK: supple w/o LA CV: IRR PULM: ctab, no inc wob ABD: soft, +bs EXT: no edema SKIN: no acute rash

## 2013-12-11 NOTE — Patient Instructions (Addendum)
Try taking claritin 10mg  a day (not claritin D).   If the cough continues then we'll need to xray you. Notify us if not better.   Take care.

## 2013-12-13 DIAGNOSIS — Z9109 Other allergy status, other than to drugs and biological substances: Secondary | ICD-10-CM | POA: Insufficient documentation

## 2013-12-13 NOTE — Assessment & Plan Note (Signed)
Intolerant of simvastatin.  She didn't want to recheck lipids until next year, offered sooner recheck.

## 2013-12-13 NOTE — Assessment & Plan Note (Signed)
Likely the issue. Ctab.  Can try claritin prn, avoid claritin-d.  D/w pt.

## 2014-01-07 ENCOUNTER — Ambulatory Visit (INDEPENDENT_AMBULATORY_CARE_PROVIDER_SITE_OTHER): Payer: Commercial Managed Care - HMO | Admitting: Cardiovascular Disease

## 2014-01-07 ENCOUNTER — Encounter: Payer: Self-pay | Admitting: Cardiovascular Disease

## 2014-01-07 VITALS — BP 130/82 | HR 54 | Ht 64.0 in | Wt 113.5 lb

## 2014-01-07 DIAGNOSIS — I6523 Occlusion and stenosis of bilateral carotid arteries: Secondary | ICD-10-CM

## 2014-01-07 DIAGNOSIS — F4321 Adjustment disorder with depressed mood: Secondary | ICD-10-CM

## 2014-01-07 DIAGNOSIS — I4891 Unspecified atrial fibrillation: Secondary | ICD-10-CM

## 2014-01-07 DIAGNOSIS — E785 Hyperlipidemia, unspecified: Secondary | ICD-10-CM

## 2014-01-07 DIAGNOSIS — F17200 Nicotine dependence, unspecified, uncomplicated: Secondary | ICD-10-CM

## 2014-01-07 DIAGNOSIS — I6529 Occlusion and stenosis of unspecified carotid artery: Secondary | ICD-10-CM

## 2014-01-07 DIAGNOSIS — F4320 Adjustment disorder, unspecified: Secondary | ICD-10-CM | POA: Insufficient documentation

## 2014-01-07 DIAGNOSIS — I658 Occlusion and stenosis of other precerebral arteries: Secondary | ICD-10-CM

## 2014-01-07 NOTE — Progress Notes (Signed)
Patient ID: Vanessa [name], female    DOB: 07-22-1934, 79 y.o.   MRN: [id number]  HPI Comments: Vanessa [name] is a very pleasant 79 year old woman with a hx of atrial fibrillation prior to her colonoscopy, h/o severe migraines at times, history of hyperlipidemia and hypothyroidism. She continues to smoke 1 to 2 cigarettes a day with her coffee which she started after she lost her husband. She presents for routine followup. 2006 she reports having carotid ultrasound with mild to moderate disease. Recent weight loss  In followup today, she reports that she is active, walks her dogs on a regular basis. Continues to have a chronic cough worse in the morning and the evening. She scheduled to have cataract surgery in the near future. Denies having any significant symptoms  of palpitations and tachycardia concerning for atrial fibrillation.   On prior visit, She had one episode after moving heavy furniture. This did not last more than several minutes. Heart rate by her blood pressure cuff was in the 145-150 range. She did deep breathing and symptoms eventually resolved. She had to lie down on the bed.  She's only taking her metoprolol as needed as baseline heart rate is very low She continues to smoke several cigarettes per day  Previous lab work showing total cholesterol 152. At that time she was taking simvastatin 10 mg daily. She reports that she has stopped the medication  EKG today shows Sinus bradycardia with rate 54 beats per minute, no significant ST or T wave changes    Outpatient Encounter Prescriptions as of 01/07/2014  Medication Sig  . ALPRAZolam (XANAX) 0.5 MG tablet Take 1-2 tablets (0.5-1 mg total) by mouth at bedtime as needed.  Marland Kitchen aspirin 81 MG EC tablet Take 81 mg by mouth daily.    Marland Kitchen aspirin-acetaminophen-caffeine (EXCEDRIN MIGRAINE) 250-250-65 MG per tablet Take 1 tablet by mouth every 6 (six) hours as needed.    . Calcium Carbonate-Vitamin D (CALCIUM 600 + D PO) Take 600 mg  by mouth 2 (two) times daily.    . Cholecalciferol (VITAMIN D PO) Take by mouth daily.    . fish oil-omega-3 fatty acids 1000 MG capsule Take 1 g by mouth daily.    Marland Kitchen levothyroxine (SYNTHROID, LEVOTHROID) 50 MCG tablet Take 1 tablet (50 mcg total) by mouth daily.  Marland Kitchen loratadine (CLARITIN) 10 MG tablet Take 1 tablet (10 mg total) by mouth daily.  . metoprolol tartrate (LOPRESSOR) 12.5 mg TABS Take 6.25 mg by mouth daily as needed. Take 1/4 tablet before procedures due to h/o A fib during colonsocopy  . Thiamine HCl (VITAMIN B-1) 100 MG tablet Take 100 mg by mouth daily.    . traMADol (ULTRAM) 50 MG tablet Take 1 tablet (50 mg total) by mouth every 6 (six) hours as needed.  . vitamin B-12 (CYANOCOBALAMIN) 1000 MCG tablet Take 1,000 mcg by mouth daily.    . vitamin C (ASCORBIC ACID) 500 MG tablet Take 500 mg by mouth daily.      Review of Systems  Constitutional: Negative.   HENT: Negative.   Eyes: Negative.   Respiratory: Negative.   Cardiovascular: Negative.   Gastrointestinal: Negative.   Endocrine: Negative.   Musculoskeletal: Negative.   Skin: Negative.   Allergic/Immunologic: Negative.   Neurological: Negative.   Hematological: Negative.   Psychiatric/Behavioral: Negative.   All other systems reviewed and are negative.   BP 130/82  Pulse 54  Ht  (1.626 m)  Wt 113 lb 8 oz (51.483 kg)  BMI  19.47 kg/m2  Physical Exam  Nursing note and vitals reviewed. Constitutional: She is oriented to person, place, and time. She appears well-developed and well-nourished.  HENT:  Head: Normocephalic.  Nose: Nose normal.  Mouth/Throat: Oropharynx is clear and moist.  Eyes: Conjunctivae are normal. Pupils are equal, round, and reactive to light.  Neck: Normal range of motion. Neck supple. No JVD present.  Cardiovascular: Regular rhythm, S1 normal, S2 normal and intact distal pulses.  Bradycardia present.  Exam reveals no gallop and no friction rub.   Murmur heard.  Crescendo systolic  murmur is present with a grade of 2/6  Pulmonary/Chest: Effort normal. No respiratory distress. She has no wheezes. She has rales. She exhibits no tenderness.  Abdominal: Soft. Bowel sounds are normal. She exhibits no distension. There is no tenderness.  Musculoskeletal: Normal range of motion. She exhibits no edema and no tenderness.  Lymphadenopathy:    She has no cervical adenopathy.  Neurological: She is alert and oriented to person, place, and time. Coordination normal.  Skin: Skin is warm and dry. No rash noted. No erythema.  Psychiatric: She has a normal mood and affect. Her behavior is normal. Judgment and thought content normal.    Assessment and Plan

## 2014-01-07 NOTE — Assessment & Plan Note (Signed)
Lost her husband earlier in the year. Continues to have difficulty. Overall doing better. Encouraged that she continue her walking program

## 2014-01-07 NOTE — Patient Instructions (Signed)
You are doing well. No medication changes were made.  Please call us if you have new issues that need to be addressed before your next appt.  Your physician wants you to follow-up in: 12 months.  You will receive a reminder letter in the mail two months in advance. If you don't receive a letter, please call our office to schedule the follow-up appointment. 

## 2014-01-07 NOTE — Assessment & Plan Note (Signed)
Previously noted to have mild to moderate carotid disease. She took herself off her statin, continues to smoke. We discussed these with her

## 2014-01-07 NOTE — Assessment & Plan Note (Signed)
Appears to be maintaining normal sinus rhythm. She denies having any palpitations or tachycardia. Suggested she closely monitor her pulse rate

## 2014-01-07 NOTE — Assessment & Plan Note (Signed)
Unclear why she took herself off the simvastatin. This was her choice. We will monitor routine lab work in followup with primary care. She has lost recent weight

## 2014-01-07 NOTE — Assessment & Plan Note (Signed)
We have encouraged her to continue to work on weaning her cigarettes and smoking cessation. She will continue to work on this and does not want any assistance with chantix.  

## 2014-05-14 ENCOUNTER — Other Ambulatory Visit: Payer: Commercial Managed Care - HMO

## 2014-05-17 ENCOUNTER — Other Ambulatory Visit: Payer: Commercial Managed Care - HMO

## 2014-05-19 ENCOUNTER — Other Ambulatory Visit: Payer: Self-pay | Admitting: Family Medicine

## 2014-05-19 ENCOUNTER — Other Ambulatory Visit (INDEPENDENT_AMBULATORY_CARE_PROVIDER_SITE_OTHER): Payer: Commercial Managed Care - HMO

## 2014-05-19 DIAGNOSIS — E038 Other specified hypothyroidism: Secondary | ICD-10-CM

## 2014-05-19 DIAGNOSIS — E785 Hyperlipidemia, unspecified: Secondary | ICD-10-CM

## 2014-05-19 DIAGNOSIS — M8580 Other specified disorders of bone density and structure, unspecified site: Secondary | ICD-10-CM

## 2014-05-19 LAB — COMPREHENSIVE METABOLIC PANEL
ALT: 11 U/L (ref 0–35)
AST: 23 U/L (ref 0–37)
Albumin: 4 g/dL (ref 3.5–5.2)
Alkaline Phosphatase: 31 U/L — ABNORMAL LOW (ref 39–117)
BUN: 10 mg/dL (ref 6–23)
CHLORIDE: 102 meq/L (ref 96–112)
CO2: 26 mEq/L (ref 19–32)
CREATININE: 0.76 mg/dL (ref 0.40–1.20)
Calcium: 9.5 mg/dL (ref 8.4–10.5)
GFR: 78.31 mL/min (ref >60.00)
GLUCOSE: 115 mg/dL — AB (ref 70–99)
Potassium: 4.2 mEq/L (ref 3.5–5.1)
Sodium: 136 mEq/L (ref 135–145)
Total Bilirubin: 0.6 mg/dL (ref 0.2–1.2)
Total Protein: 6.7 g/dL (ref 6.0–8.3)

## 2014-05-19 LAB — TSH: TSH: 1.56 u[IU]/mL (ref 0.35–4.50)

## 2014-05-19 LAB — VITAMIN D 25 HYDROXY (VIT D DEFICIENCY, FRACTURES): VITD: 43.05 ng/mL (ref 30.00–100.00)

## 2014-05-19 LAB — LIPID PANEL
CHOL/HDL RATIO: 3
Cholesterol: 200 mg/dL (ref 0–200)
HDL: 60.2 mg/dL (ref >39.00)
LDL Cholesterol: 119 mg/dL — ABNORMAL HIGH (ref 0–99)
NonHDL: 139.8
TRIGLYCERIDES: 102 mg/dL (ref 0.0–149.0)
VLDL: 20.4 mg/dL (ref 0.0–40.0)

## 2014-05-24 ENCOUNTER — Telehealth: Payer: Self-pay | Admitting: Family Medicine

## 2014-05-24 ENCOUNTER — Encounter: Payer: Self-pay | Admitting: Family Medicine

## 2014-05-24 ENCOUNTER — Ambulatory Visit (INDEPENDENT_AMBULATORY_CARE_PROVIDER_SITE_OTHER): Payer: Commercial Managed Care - HMO | Admitting: Family Medicine

## 2014-05-24 VITALS — BP 134/60 | HR 65 | Temp 98.6°F | Ht 64.0 in | Wt 107.2 lb

## 2014-05-24 DIAGNOSIS — G4700 Insomnia, unspecified: Secondary | ICD-10-CM

## 2014-05-24 DIAGNOSIS — Z7189 Other specified counseling: Secondary | ICD-10-CM

## 2014-05-24 DIAGNOSIS — Z23 Encounter for immunization: Secondary | ICD-10-CM

## 2014-05-24 DIAGNOSIS — R739 Hyperglycemia, unspecified: Secondary | ICD-10-CM

## 2014-05-24 DIAGNOSIS — J111 Influenza due to unidentified influenza virus with other respiratory manifestations: Secondary | ICD-10-CM

## 2014-05-24 DIAGNOSIS — M8580 Other specified disorders of bone density and structure, unspecified site: Secondary | ICD-10-CM

## 2014-05-24 DIAGNOSIS — M158 Other polyosteoarthritis: Secondary | ICD-10-CM

## 2014-05-24 DIAGNOSIS — Z0000 Encounter for general adult medical examination without abnormal findings: Secondary | ICD-10-CM

## 2014-05-24 DIAGNOSIS — E038 Other specified hypothyroidism: Secondary | ICD-10-CM

## 2014-05-24 DIAGNOSIS — J101 Influenza due to other identified influenza virus with other respiratory manifestations: Secondary | ICD-10-CM

## 2014-05-24 MED ORDER — TRAMADOL HCL 50 MG PO TABS: 50.0000 mg | ORAL_TABLET | Freq: Four times a day (QID) | ORAL | Status: DC | PRN

## 2014-05-24 MED ORDER — ALPRAZOLAM 0.5 MG PO TABS: 0.5000 mg | ORAL_TABLET | Freq: Every evening | ORAL | Status: DC | PRN

## 2014-05-24 MED ORDER — LEVOTHYROXINE SODIUM 50 MCG PO TABS: 50.0000 ug | ORAL_TABLET | Freq: Every day | ORAL | Status: DC

## 2014-05-24 NOTE — Progress Notes (Signed)
Pre visit review using our clinic review tool, if applicable. No additional management support is needed unless otherwise documented below in the visit note.  I have personally reviewed the Medicare Annual Wellness questionnaire and have noted 1. The patient's medical and social history 2. Their use of alcohol, tobacco or illicit drugs 3. Their current medications and supplements 4. The patient's functional ability including ADL's, fall risks, home safety risks and hearing or visual             impairment. 5. Diet and physical activities 6. Evidence for depression or mood disorders  The patients weight, height, BMI have been recorded in the chart and visual acuity is per eye clinic.  I have made referrals, counseling and provided education to the patient based review of the above and I have provided the pt with a written personalized care plan for preventive services.  Provider list updated- see scanned forms.  Routine anticipatory guidance given to patient.  See health maintenance.  Flu 2015 Shingles 2010 PNA 2013 Tetanus 2013 Colonoscopy 2012 Breast cancer screening- 2015 DXA declined by patient.  D/w pt.  She wouldn't want intervention if abnormal  Advance directive- daughter Cleophas DunkerKarla [name] designated if patient were incapacitated.  Cognitive function addressed- see scanned forms- and if abnormal then additional documentation follows.  Grief reaction- pt's niece and then pt's sister recently died. Was close to both.  Some improvement in mood recently, but still sad. Still okay for outpatient f/u.  She isn't interested in counseling, d/w pt.  Insomnia controlled with prn xanax.  No dose escalation.  Sugar elevation noted, d/w pt about diet and exercise.   Hypothyroid.  TSH wnl, no ADE on med. No neck mass.   PMH and SH reviewed  Meds, vitals, and allergies reviewed.   ROS: See HPI.  Otherwise negative.    GEN: nad, alert and oriented HEENT: mucous membranes moist NECK: supple  w/o LA, no tmg CV: rrr. PULM: ctab, no inc wob ABD: soft, +bs EXT: no edema SKIN: no acute rash

## 2014-05-24 NOTE — Telephone Encounter (Signed)
emmi mailed  °

## 2014-05-24 NOTE — Patient Instructions (Addendum)
Schedule a wellness visit for spring of 2017.  Let us know about needing refills next winter and we can do those to cover the time until your follow up.  Take care. Glad to see you.

## 2014-05-25 DIAGNOSIS — R739 Hyperglycemia, unspecified: Secondary | ICD-10-CM | POA: Insufficient documentation

## 2014-05-25 DIAGNOSIS — E119 Type 2 diabetes mellitus without complications: Secondary | ICD-10-CM | POA: Insufficient documentation

## 2014-05-25 DIAGNOSIS — E1165 Type 2 diabetes mellitus with hyperglycemia: Secondary | ICD-10-CM | POA: Insufficient documentation

## 2014-05-25 DIAGNOSIS — M1990 Unspecified osteoarthritis, unspecified site: Secondary | ICD-10-CM | POA: Insufficient documentation

## 2014-05-25 NOTE — Assessment & Plan Note (Addendum)
Flu 2015 Shingles 2010 PNA 2013 Tetanus 2013 Colonoscopy 2012 Breast cancer screening- 2015 DXA declined by patient.  D/w pt.  She wouldn't want intervention if abnormal  Advance directive- daughter Cleophas DunkerKarla [name] designated if patient were incapacitated.  Cognitive function addressed- see scanned forms- and if abnormal then additional documentation follows.  Grief reaction- pt's niece and then pt's sister recently died. Was close to both.  Some improvement in mood recently, but still sad. Still okay for outpatient f/u.  She isn't interested in counseling, d/w pt.  Sugar elevation noted, d/w pt about diet and exercise.

## 2014-05-25 NOTE — Assessment & Plan Note (Signed)
tsh wnl, no tmg on exam. Continue as is.  Labs d/w pt.

## 2014-05-25 NOTE — Assessment & Plan Note (Signed)
Continue prn tramadol, no ade on med.

## 2014-05-25 NOTE — Assessment & Plan Note (Signed)
Declined fu, d/w pt.

## 2014-05-25 NOTE — Assessment & Plan Note (Signed)
Insomnia controlled with prn xanax.  No dose escalation.  No ade on med.  Continue as is.  She agrees.

## 2014-05-25 NOTE — Assessment & Plan Note (Signed)
D/w pt about diet/exercise/labs.

## 2014-11-18 ENCOUNTER — Other Ambulatory Visit: Payer: Self-pay | Admitting: Family Medicine

## 2014-11-18 NOTE — Telephone Encounter (Signed)
Electronic refill request. Last Filled:    120 tablet 5 Rf on 05/24/2014  Please advise.

## 2014-11-19 NOTE — Telephone Encounter (Signed)
Medication phoned to pharmacy.  

## 2014-11-19 NOTE — Telephone Encounter (Signed)
Please call in.  Thanks.   

## 2015-01-12 ENCOUNTER — Encounter: Payer: Self-pay | Admitting: Cardiovascular Disease

## 2015-01-12 ENCOUNTER — Ambulatory Visit (INDEPENDENT_AMBULATORY_CARE_PROVIDER_SITE_OTHER): Payer: Commercial Managed Care - HMO | Admitting: Cardiovascular Disease

## 2015-01-12 VITALS — BP 120/70 | HR 59 | Ht 64.0 in | Wt 105.5 lb

## 2015-01-12 DIAGNOSIS — I4891 Unspecified atrial fibrillation: Secondary | ICD-10-CM | POA: Diagnosis not present

## 2015-01-12 DIAGNOSIS — E785 Hyperlipidemia, unspecified: Secondary | ICD-10-CM | POA: Diagnosis not present

## 2015-01-12 DIAGNOSIS — Z720 Tobacco use: Secondary | ICD-10-CM

## 2015-01-12 DIAGNOSIS — F17200 Nicotine dependence, unspecified, uncomplicated: Secondary | ICD-10-CM

## 2015-01-12 DIAGNOSIS — I6523 Occlusion and stenosis of bilateral carotid arteries: Secondary | ICD-10-CM

## 2015-01-12 NOTE — Assessment & Plan Note (Signed)
We have encouraged her to continue to work on weaning her cigarettes and smoking cessation. She will continue to work on this and does not want any assistance with chantix.  

## 2015-01-12 NOTE — Progress Notes (Signed)
Patient ID: DAMONIE FURNEY, female    DOB: 1935/03/19, 80 y.o.   MRN: [id number]  HPI Comments: Ms. Emminger is a very pleasant 80 year old woman with a hx of atrial fibrillation prior to her colonoscopy, h/o severe migraines at times, history of hyperlipidemia and hypothyroidism. She continues to smoke 1 to 2 cigarettes a day with her coffee which she started after she lost her husband. She presents for routine followup. 2006 she reports having carotid ultrasound with mild to moderate disease. Recent weight loss  In follow-up today, her weight is down 8 pounds. She reports that she is eating adequate amounts Denies any symptoms concerning for angina Reports having rare very short episodes of palpitations. Does not feel this is a problem Continues to smoke daily Currently not on a cholesterol medication. Cholesterol up to 200 She continues to walk her dog Continued mild chronic cough  EKG on today's visit shows normal sinus rhythm with rate 59 bpm, no significant ST or T-wave changes  Other past medical history Would not sweated, just bring it home  Previous lab work showing total cholesterol 152. At that time she was taking simvastatin 10 mg daily. She reports that she has stopped the medication   Allergies  Allergen Reactions  . Celebrex [Celecoxib]     GI intolerance (but tolerates aspirin)  . Codeine   . Cortisone   . Evista [Raloxifene Hydrochloride]     pain  . Naproxen   . Simvastatin     aches    Current Outpatient Prescriptions on File Prior to Visit  Medication Sig Dispense Refill  . ALPRAZolam (XANAX) 0.5 MG tablet Take 1-2 tablets (0.5-1 mg total) by mouth at bedtime as needed. 180 tablet 1  . aspirin 81 MG EC tablet Take 81 mg by mouth daily.      Marland Kitchen aspirin-acetaminophen-caffeine (EXCEDRIN MIGRAINE) 250-250-65 MG per tablet Take 1 tablet by mouth every 6 (six) hours as needed.      . Calcium Carbonate-Vitamin D (CALCIUM 600 + D PO) Take 600 mg by mouth 2 (two)  times daily.      . Cholecalciferol (VITAMIN D PO) Take by mouth daily.      . fish oil-omega-3 fatty acids 1000 MG capsule Take 1 g by mouth daily.      Marland Kitchen latanoprost (XALATAN) 0.005 % ophthalmic solution Place 1 drop into both eyes at bedtime.    Marland Kitchen levothyroxine (SYNTHROID, LEVOTHROID) 50 MCG tablet Take 1 tablet (50 mcg total) by mouth daily. 90 tablet 3  . Multiple Vitamin (MULTIVITAMIN) tablet Take 1 tablet by mouth daily.    . traMADol (ULTRAM) 50 MG tablet TAKE ONE (1) TABLET EVERY 6 HOURS AS NEEDED 120 tablet 5  . metoprolol tartrate (LOPRESSOR) 12.5 mg TABS Take 6.25 mg by mouth daily as needed. Take 1/4 tablet before procedures due to h/o A fib during colonsocopy     No current facility-administered medications on file prior to visit.    Past Medical History  Diagnosis Date  . Hyperlipidemia   . Osteopenia     prev with 10 years of fosamax and intolerant of evista  . Migraines     without aura, sx since age 33  . Insomnia   . Hypothyroidism   . AF (atrial fibrillation)     during colonoscopy  . Anemia     treated ~2009 with iron, resolved  . Arthritis   . Cataract     surgery on R catarct 2012  . Hyperglycemia  mild inc in fasting sugar  . Glaucoma     Past Surgical History  Procedure Laterality Date  . Colonoscopy  2012  . Cataract extraction    . Refractive surgery      Social History  reports that she has been smoking Cigarettes.  She has a 1.5 pack-year smoking history. She has never used smokeless tobacco. She reports that she does not drink alcohol or use illicit drugs.  Family History family history includes Diabetes in her father; Migraines in her mother; Stroke in her mother. There is no history of Breast cancer or Colon cancer.   Review of Systems  Constitutional: Negative.   Respiratory: Negative.   Cardiovascular: Negative.   Gastrointestinal: Negative.   Musculoskeletal: Negative.   Neurological: Negative.   Hematological: Negative.    Psychiatric/Behavioral: Negative.   All other systems reviewed and are negative.   BP 120/70 mmHg  Pulse 59  Ht  (1.626 m)  Wt 105 lb 8 oz (47.854 kg)  BMI 18.10 kg/m2  Physical Exam  Constitutional: She is oriented to person, place, and time. She appears well-developed and well-nourished.  HENT:  Head: Normocephalic.  Nose: Nose normal.  Mouth/Throat: Oropharynx is clear and moist.  Eyes: Conjunctivae are normal. Pupils are equal, round, and reactive to light.  Neck: Normal range of motion. Neck supple. No JVD present.  Cardiovascular: Regular rhythm, S1 normal, S2 normal and intact distal pulses.  Bradycardia present.  Exam reveals no gallop and no friction rub.   Murmur heard.  Crescendo systolic murmur is present with a grade of 2/6  Pulmonary/Chest: Effort normal. No respiratory distress. She has no wheezes. She has rales. She exhibits no tenderness.  Abdominal: Soft. Bowel sounds are normal. She exhibits no distension. There is no tenderness.  Musculoskeletal: Normal range of motion. She exhibits no edema or tenderness.  Lymphadenopathy:    She has no cervical adenopathy.  Neurological: She is alert and oriented to person, place, and time. Coordination normal.  Skin: Skin is warm and dry. No rash noted. No erythema.  Psychiatric: She has a normal mood and affect. Her behavior is normal. Judgment and thought content normal.    Assessment and Plan  Nursing note and vitals reviewed.

## 2015-01-12 NOTE — Patient Instructions (Signed)
You are doing well. No medication changes were made.  We will schedule a carotid ultrasound for carotid stenosis  Please call us if you have new issues that need to be addressed before your next appt.  Your physician wants you to follow-up in: 12 months.  You will receive a reminder letter in the mail two months in advance. If you don't receive a letter, please call our office to schedule the follow-up appointment.   

## 2015-01-12 NOTE — Assessment & Plan Note (Signed)
Currently not on a cholesterol medication. Legs feel better off simvastatin We have stressed importance of a cholesterol medication given her continued smoking, known carotid disease Carotid ultrasound pending

## 2015-01-12 NOTE — Assessment & Plan Note (Signed)
She denies significant episodes of atrial fibrillation. Denies further workup at this time. Takes metoprolol as needed for breakthrough arrhythmia

## 2015-01-12 NOTE — Assessment & Plan Note (Signed)
We have recommended a repeat carotid ultrasound If significant, this would add further impetus to restart statin She is reluctant

## 2015-02-04 ENCOUNTER — Ambulatory Visit (INDEPENDENT_AMBULATORY_CARE_PROVIDER_SITE_OTHER): Payer: Commercial Managed Care - HMO

## 2015-02-04 DIAGNOSIS — I4891 Unspecified atrial fibrillation: Secondary | ICD-10-CM

## 2015-02-04 DIAGNOSIS — I6523 Occlusion and stenosis of bilateral carotid arteries: Secondary | ICD-10-CM

## 2015-05-20 ENCOUNTER — Other Ambulatory Visit: Payer: Self-pay | Admitting: *Deleted

## 2015-05-20 NOTE — Telephone Encounter (Signed)
Faxed refill request. Last office visit:   05/24/14 Last Filled:   Alprazolam  180 tablet 1 05/24/2014  Last Filled:   Levothyroxine  90 tablet 3 05/24/2014  Last Filled:   Tramadol  120 tablet 5 11/19/2014  Please advise.

## 2015-05-22 MED ORDER — LEVOTHYROXINE SODIUM 50 MCG PO TABS: 50.0000 ug | ORAL_TABLET | Freq: Every day | ORAL | Status: DC

## 2015-05-22 MED ORDER — TRAMADOL HCL 50 MG PO TABS: ORAL_TABLET | ORAL | Status: DC

## 2015-05-22 MED ORDER — ALPRAZOLAM 0.5 MG PO TABS: 0.5000 mg | ORAL_TABLET | Freq: Every evening | ORAL | Status: DC | PRN

## 2015-05-22 NOTE — Telephone Encounter (Signed)
Needs CPE scheduled.  Thanks.  Please call in.  Thanks.

## 2015-05-23 NOTE — Telephone Encounter (Signed)
Medication phoned to pharmacy.  Patient says Dr. Para March agreed to refill her meds until Spring because she had to RS so much last year due to weather.  She will schedule CPE this Spring.

## 2015-06-23 ENCOUNTER — Other Ambulatory Visit: Payer: Self-pay | Admitting: Family Medicine

## 2015-06-23 DIAGNOSIS — M8580 Other specified disorders of bone density and structure, unspecified site: Secondary | ICD-10-CM

## 2015-06-23 DIAGNOSIS — E785 Hyperlipidemia, unspecified: Secondary | ICD-10-CM

## 2015-06-30 ENCOUNTER — Other Ambulatory Visit (INDEPENDENT_AMBULATORY_CARE_PROVIDER_SITE_OTHER): Payer: PPO

## 2015-06-30 DIAGNOSIS — M8580 Other specified disorders of bone density and structure, unspecified site: Secondary | ICD-10-CM

## 2015-06-30 DIAGNOSIS — E785 Hyperlipidemia, unspecified: Secondary | ICD-10-CM | POA: Diagnosis not present

## 2015-06-30 LAB — COMPREHENSIVE METABOLIC PANEL
ALBUMIN: 4.4 g/dL (ref 3.5–5.2)
ALT: 12 U/L (ref 0–35)
AST: 25 U/L (ref 0–37)
Alkaline Phosphatase: 37 U/L — ABNORMAL LOW (ref 39–117)
BUN: 12 mg/dL (ref 6–23)
CHLORIDE: 103 meq/L (ref 96–112)
CO2: 29 mEq/L (ref 19–32)
Calcium: 9.8 mg/dL (ref 8.4–10.5)
Creatinine, Ser: 0.81 mg/dL (ref 0.40–1.20)
GFR: 72.54 mL/min (ref >60.00)
Glucose, Bld: 102 mg/dL — ABNORMAL HIGH (ref 70–99)
POTASSIUM: 4 meq/L (ref 3.5–5.1)
SODIUM: 140 meq/L (ref 135–145)
Total Bilirubin: 0.7 mg/dL (ref 0.2–1.2)
Total Protein: 7.2 g/dL (ref 6.0–8.3)

## 2015-06-30 LAB — LIPID PANEL
CHOLESTEROL: 200 mg/dL (ref 0–200)
HDL: 59.7 mg/dL (ref >39.00)
LDL CALC: 124 mg/dL — AB (ref 0–99)
NonHDL: 140.45
Total CHOL/HDL Ratio: 3
Triglycerides: 84 mg/dL (ref 0.0–149.0)
VLDL: 16.8 mg/dL (ref 0.0–40.0)

## 2015-06-30 LAB — TSH: TSH: 1.46 u[IU]/mL (ref 0.35–4.50)

## 2015-06-30 LAB — VITAMIN D 25 HYDROXY (VIT D DEFICIENCY, FRACTURES): VITD: 50.32 ng/mL (ref 30.00–100.00)

## 2015-07-07 ENCOUNTER — Ambulatory Visit (INDEPENDENT_AMBULATORY_CARE_PROVIDER_SITE_OTHER): Payer: PPO

## 2015-07-07 VITALS — BP 118/70 | HR 55 | Temp 98.5°F | Ht 64.0 in | Wt 105.8 lb

## 2015-07-07 DIAGNOSIS — Z0000 Encounter for general adult medical examination without abnormal findings: Secondary | ICD-10-CM | POA: Diagnosis not present

## 2015-07-07 NOTE — Patient Instructions (Signed)
[name] , Thank you for taking time to come for your Medicare Wellness Visit. I appreciate your ongoing commitment to your health goals. Please review the following plan we discussed and let me know if I can assist you in the future.   These are the goals we discussed: Goals    . Increase physical activity     When weather permits, I will continue to walk at least 60 min daily.        This is a list of the screening recommended for you and due dates:  Health Maintenance  Topic Date Due  . Mammogram  06/21/2016*  . Flu Shot  11/22/2015  . Tetanus Vaccine  01/06/2022  . DEXA scan (bone density measurement)  Completed  . Shingles Vaccine  Completed  . Pneumonia vaccines  Completed  *Topic was postponed. The date shown is not the original due date.   Preventive Care for Adults  A healthy lifestyle and preventive care can promote health and wellness. Preventive health guidelines for adults include the following key practices.  . A routine yearly physical is a good way to check with your health care provider about your health and preventive screening. It is a chance to share any concerns and updates on your health and to receive a thorough exam.  . Visit your dentist for a routine exam and preventive care every 6 months. Brush your teeth twice a day and floss once a day. Good oral hygiene prevents tooth decay and gum disease.  . The frequency of eye exams is based on your age, health, family medical history, use  of contact lenses, and other factors. Follow your health care provider's ecommendations for frequency of eye exams.  . Eat a healthy diet. Foods like vegetables, fruits, whole grains, low-fat dairy products, and lean protein foods contain the nutrients you need without too many calories. Decrease your intake of foods high in solid fats, added sugars, and salt. Eat the right amount of calories for you. Get information about a proper diet from your health care provider, if  necessary.  . Regular physical exercise is one of the most important things you can do for your health. Most adults should get at least 150 minutes of moderate-intensity exercise (any activity that increases your heart rate and causes you to sweat) each week. In addition, most adults need muscle-strengthening exercises on 2 or more days a week.  Silver Sneakers may be a benefit available to you. To determine eligibility, you may visit the website: www.silversneakers.com or contact program at [phone] Mon-Fri between 8AM-8PM.   . Maintain a healthy weight. The body mass index (BMI) is a screening tool to identify possible weight problems. It provides an estimate of body fat based on height and weight. Your health care provider can find your BMI and can help you achieve or maintain a healthy weight.   For adults 20 years and older: ? A BMI below 18.5 is considered underweight. ? A BMI of 18.5 to 24.9 is normal. ? A BMI of 25 to 29.9 is considered overweight. ? A BMI of 30 and above is considered obese.   . Maintain normal blood lipids and cholesterol levels by exercising and minimizing your intake of saturated fat. Eat a balanced diet with plenty of fruit and vegetables. Blood tests for lipids and cholesterol should begin at age 79 and be repeated every 5 years. If your lipid or cholesterol levels are high, you are over 50, or you are at high  risk for heart disease, you may need your cholesterol levels checked more frequently. Ongoing high lipid and cholesterol levels should be treated with medicines if diet and exercise are not working.  . If you smoke, find out from your health care provider how to quit. If you do not use tobacco, please do not start.  . If you choose to drink alcohol, please do not consume more than 2 drinks per day. One drink is considered to be 12 ounces (355 mL) of beer, 5 ounces (148 mL) of wine, or 1.5 ounces (44 mL) of liquor.  . If you are 42-50 years old, ask your  health care provider if you should take aspirin to prevent strokes.  . Use sunscreen. Apply sunscreen liberally and repeatedly throughout the day. You should seek shade when your shadow is shorter than you. Protect yourself by wearing long sleeves, pants, a wide-brimmed hat, and sunglasses year round, whenever you are outdoors.  . Once a month, do a whole body skin exam, using a mirror to look at the skin on your back. Tell your health care provider of new moles, moles that have irregular borders, moles that are larger than a pencil eraser, or moles that have changed in shape or color.

## 2015-07-07 NOTE — Progress Notes (Signed)
Pre visit review using our clinic review tool, if applicable. No additional management support is needed unless otherwise documented below in the visit note. 

## 2015-07-07 NOTE — Progress Notes (Signed)
Subjective:   Vanessa [name] is a 81 y.o. female who presents for Medicare Annual (Subsequent) preventive examination.  Cardiac Risk Factors include: advanced age (>68men, >85 women);dyslipidemia;smoking/ tobacco exposure     Objective:     Vitals: BP 118/70 mmHg  Pulse 55  Temp(Src) 98.5 F (36.9 C) (Oral)  Ht  (1.626 m)  Wt 105 lb 12 oz (47.968 kg)  BMI 18.14 kg/m2  SpO2 99%  Tobacco History  Smoking status  . Current Every Day Smoker -- 0.25 packs/day for 6 years  . Types: Cigarettes  Smokeless tobacco  . Never Used    Comment: 1-2 cigarettes daily     Ready to quit: No Counseling given: No   Past Medical History  Diagnosis Date  . Hyperlipidemia   . Osteopenia     prev with 10 years of fosamax and intolerant of evista  . Migraines     without aura, sx since age 61  . Insomnia   . Hypothyroidism   . AF (atrial fibrillation) (HCC)     during colonoscopy  . Anemia     treated ~2009 with iron, resolved  . Arthritis   . Cataract     surgery on R catarct 2012  . Hyperglycemia     mild inc in fasting sugar  . Glaucoma    Past Surgical History  Procedure Laterality Date  . Colonoscopy  2012  . Cataract extraction    . Refractive surgery     Family History  Problem Relation Age of Onset  . Diabetes Father   . Migraines Mother   . Stroke Mother     TIAs  . Breast cancer Neg Hx   . Colon cancer Neg Hx    History  Sexual Activity  . Sexual Activity: No    Outpatient Encounter Prescriptions as of 07/07/2015  Medication Sig  . ALPRAZolam (XANAX) 0.5 MG tablet Take 1-2 tablets (0.5-1 mg total) by mouth at bedtime as needed.  Marland Kitchen aspirin 81 MG EC tablet Take 81 mg by mouth daily.    Marland Kitchen aspirin-acetaminophen-caffeine (EXCEDRIN MIGRAINE) 250-250-65 MG per tablet Take 1 tablet by mouth every 6 (six) hours as needed.    . Calcium Carbonate-Vitamin D (CALCIUM 600 + D PO) Take 600 mg by mouth 2 (two) times daily.    . Cholecalciferol (VITAMIN D PO)  Take by mouth daily.    . fish oil-omega-3 fatty acids 1000 MG capsule Take 1 g by mouth daily.    Marland Kitchen latanoprost (XALATAN) 0.005 % ophthalmic solution Place 1 drop into both eyes at bedtime.  Marland Kitchen levothyroxine (SYNTHROID, LEVOTHROID) 50 MCG tablet Take 1 tablet (50 mcg total) by mouth daily.  . Multiple Vitamin (MULTIVITAMIN) tablet Take 1 tablet by mouth daily.  . traMADol (ULTRAM) 50 MG tablet TAKE ONE (1) TABLET EVERY 6 HOURS AS NEEDED  . vitamin B-12 (CYANOCOBALAMIN) 500 MCG tablet Take 500 mcg by mouth daily.  . [DISCONTINUED] metoprolol tartrate (LOPRESSOR) 12.5 mg TABS Take 6.25 mg by mouth daily as needed. Take 1/4 tablet before procedures due to h/o A fib during colonsocopy   No facility-administered encounter medications on file as of 07/07/2015.    Activities of Daily Living In your present state of health, do you have any difficulty performing the following activities: 07/07/2015  Hearing? N  Vision? N  Difficulty concentrating or making decisions? N  Walking or climbing stairs? N  Dressing or bathing? N  Doing errands, shopping? N  Preparing Food and eating ?  N  Using the Toilet? N  In the past six months, have you accidently leaked urine? N  Do you have problems with loss of bowel control? N  Managing your Medications? N  Managing your Finances? N  Housekeeping or managing your Housekeeping? N    Patient Care Team: Joaquim NamGraham S Duncan, MD as PCP - General (Family Medicine)  Dr. Julien Nordmannimothy Gollan - Cardiology Dr. Inez PilgrimBrasington - Optometry    Assessment:      Hearing Screening   125Hz  250Hz  500Hz  1000Hz  2000Hz  4000Hz  8000Hz   Right ear:   40 40 40 40   Left ear:   40 40 40 40   Vision Screening Comments: Last eye exam 02/15/2015 with Dr. Inez PilgrimBrasington  Exercise Activities and Dietary recommendations Current Exercise Habits: Home exercise routine, Type of exercise: walking, Time (Minutes): 60, Frequency (Times/Week): 7, Weekly Exercise (Minutes/Week): 420, Intensity: Mild,  Exercise limited by: None identified  Goals    . Increase physical activity     When weather permits, I will continue to walk at least 60 min daily.       Fall Risk Fall Risk  07/07/2015 05/24/2014 05/20/2013 05/19/2012  Falls in the past year? No Yes Yes No  Number falls in past yr: - - 1 -  Injury with Fall? - - No -   Depression Screen PHQ 2/9 Scores 07/07/2015 05/24/2014 05/20/2013 05/19/2012  PHQ - 2 Score 0 6 1 0     Cognitive Testing No flowsheet data found.  Immunization History  Administered Date(s) Administered  . Influenza Split 01/25/2011, 01/24/2012, 01/21/2013  . Influenza-Unspecified 01/20/2014  . Pneumococcal Conjugate-13 05/24/2014  . Pneumococcal Polysaccharide-23 05/14/2011  . Tdap 01/07/2012   Screening Tests Health Maintenance  Topic Date Due  . MAMMOGRAM  06/21/2016 (Originally 08/13/2014)  . INFLUENZA VACCINE  11/22/2015  . TETANUS/TDAP  01/06/2022  . DEXA SCAN  Completed  . ZOSTAVAX  Completed  . PNA vac Low Risk Adult  Completed      Plan:      I have personally reviewed the Medicare Annual Wellness questionnaire and have noted the following in the patient's chart:  A. Medical and social history B. Use of alcohol, tobacco or illicit drugs  C. Current medications and supplements D. Functional ability and status E.  Nutritional status F.  Physical activity G. Advance directives H. List of other physicians I.  Hospitalizations, surgeries, and ER visits in previous 12 months J.  Vitals K. Screenings to include hearing, vision, cognitive, depression L. Referrals and appointments - none  In addition, I reviewed preventive protocols, quality metrics, and best practice recommendations specific to patient. A written personalized care plan for preventive services as well as general preventive health recommendations were provided to patient. Additionally, there was a discussion with patient about ways to lower LDL cholesterol and foods rich in Vitamin  B-12.   See attached scanned questionnaire for additional information.   Signed,   Randa EvensLesia Kenesha Moshier, MHA, BS, LPN Health Advisor 07/04/2015   I reviewed health advisor's note, was available for consultation on the day of service listed in this note, and agree with documentation and plan. Crawford GivensGraham Duncan, MD.

## 2015-07-11 ENCOUNTER — Ambulatory Visit (INDEPENDENT_AMBULATORY_CARE_PROVIDER_SITE_OTHER): Payer: PPO | Admitting: Family Medicine

## 2015-07-11 ENCOUNTER — Encounter: Payer: Self-pay | Admitting: Family Medicine

## 2015-07-11 VITALS — BP 132/70 | HR 58 | Temp 98.7°F | Wt 105.5 lb

## 2015-07-11 DIAGNOSIS — R739 Hyperglycemia, unspecified: Secondary | ICD-10-CM

## 2015-07-11 DIAGNOSIS — G43009 Migraine without aura, not intractable, without status migrainosus: Secondary | ICD-10-CM

## 2015-07-11 DIAGNOSIS — G4700 Insomnia, unspecified: Secondary | ICD-10-CM | POA: Diagnosis not present

## 2015-07-11 DIAGNOSIS — R5383 Other fatigue: Secondary | ICD-10-CM

## 2015-07-11 DIAGNOSIS — E785 Hyperlipidemia, unspecified: Secondary | ICD-10-CM

## 2015-07-11 DIAGNOSIS — E039 Hypothyroidism, unspecified: Secondary | ICD-10-CM

## 2015-07-11 DIAGNOSIS — Z7189 Other specified counseling: Secondary | ICD-10-CM | POA: Diagnosis not present

## 2015-07-11 MED ORDER — LEVOTHYROXINE SODIUM 50 MCG PO TABS: 50.0000 ug | ORAL_TABLET | Freq: Every day | ORAL | Status: DC

## 2015-07-11 MED ORDER — ALPRAZOLAM 0.5 MG PO TABS: 0.5000 mg | ORAL_TABLET | Freq: Every evening | ORAL | Status: DC | PRN

## 2015-07-11 MED ORDER — TRAMADOL HCL 50 MG PO TABS: 75.0000 mg | ORAL_TABLET | Freq: Three times a day (TID) | ORAL | Status: DC

## 2015-07-11 NOTE — Progress Notes (Signed)
Pre visit review using our clinic review tool, if applicable. No additional management support is needed unless otherwise documented below in the visit note.  Insomnia controlled with qhs BZD w/o ADE.  She has some effect from med.  She needed refills, done at [facility].   Migraines.  Chronic.  Ongoing.  Worse with weather changes.  This is her baseline.  Much improved with tramadol use.  She needed refills, done at [facility].  No ADE on med.    Fatigue.  H/o anemia.  Sx going on for months. Unclear how much is age related.  No snoring, no OSA. Mood is okay.  TSH wnl, d/w pt.   Declined DXA and mammogram. She wouldn't want intervention in case of abnormality, if detected.  D/w pt.    Living will d/w pt.  Daughters Nicholaus BloomKelley and Paula ComptonKarla equally designated if patient were incapacitated.    PMH and SH reviewed  ROS: See HPI, otherwise noncontributory.  Meds, vitals, and allergies reviewed.   GEN: nad, alert and oriented HEENT: mucous membranes moist NECK: supple w/o LA CV: rrr.  no murmur PULM: ctab, no inc wob ABD: soft, +bs EXT: no edema SKIN: no acute rash

## 2015-07-11 NOTE — Patient Instructions (Signed)
Take care.  Glad to see you.  Go to the lab on the way out.  We'll contact you with your lab report. Update me as needed.  

## 2015-07-12 DIAGNOSIS — R5383 Other fatigue: Secondary | ICD-10-CM | POA: Insufficient documentation

## 2015-07-12 LAB — CBC WITH DIFFERENTIAL/PLATELET
BASOS PCT: 0.3 % (ref 0.0–3.0)
Basophils Absolute: 0 10*3/uL (ref 0.0–0.1)
EOS PCT: 3.6 % (ref 0.0–5.0)
Eosinophils Absolute: 0.3 10*3/uL (ref 0.0–0.7)
HCT: 39 % (ref 36.0–46.0)
HEMOGLOBIN: 13.1 g/dL (ref 12.0–15.0)
Lymphocytes Relative: 28.8 % (ref 12.0–46.0)
Lymphs Abs: 2.1 10*3/uL (ref 0.7–4.0)
MCHC: 33.7 g/dL (ref 30.0–36.0)
MCV: 90.7 fl (ref 78.0–100.0)
MONO ABS: 0.5 10*3/uL (ref 0.1–1.0)
Monocytes Relative: 7.5 % (ref 3.0–12.0)
NEUTROS ABS: 4.3 10*3/uL (ref 1.4–7.7)
Neutrophils Relative %: 59.8 % (ref 43.0–77.0)
PLATELETS: 219 10*3/uL (ref 150.0–400.0)
RBC: 4.3 Mil/uL (ref 3.87–5.11)
RDW: 13.6 % (ref 11.5–15.5)
WBC: 7.2 10*3/uL (ref 4.0–10.5)

## 2015-07-12 LAB — VITAMIN B12

## 2015-07-12 NOTE — Assessment & Plan Note (Signed)
Continue BZD as is. No ADE on med.

## 2015-07-12 NOTE — Assessment & Plan Note (Signed)
Check cbc and b12.  tsh wnl.  Mood okay.  dw pt.  She agrees.

## 2015-07-12 NOTE — Assessment & Plan Note (Signed)
TSH wnl, d/w pt.  Continue as is.

## 2015-07-12 NOTE — Assessment & Plan Note (Signed)
Continue tramadol as is. No ADE on med.

## 2015-07-12 NOTE — Assessment & Plan Note (Signed)
D/w pt.  Carotid study with minimal disease.  ~6% risk per [place].  She wanted to avoid other meds. At this point, okay to stay off statin.  dw pt.   >25 minutes spent in face to face time with patient, >50% spent in counselling or coordination of care.

## 2015-09-01 DIAGNOSIS — H401131 Primary open-angle glaucoma, bilateral, mild stage: Secondary | ICD-10-CM | POA: Diagnosis not present

## 2016-01-09 ENCOUNTER — Telehealth: Payer: Self-pay | Admitting: Cardiovascular Disease

## 2016-01-09 NOTE — Telephone Encounter (Signed)
Pt daughter calling stating pt has been having some dizzy spells Been going on for a few days, she does have some chest congestion. She is not eating well. She is always tired.  They are aware pt has been Diagnosed with having Afib and does not have a device yet.  Would like to know what can we do in the meantime Pt is coming to see us on Friday, she has been added to wait list.   Please advise.

## 2016-01-10 NOTE — Telephone Encounter (Signed)
Pt daughter is calling back this morning, inquiring of Dr. Windell HummingbirdGollan's recommendation. I did inform her that pt is on the wait list, and we will call if any canellations.

## 2016-01-10 NOTE — Telephone Encounter (Signed)
Pt has not been seen in our office in almost a year. She will need evaluation. I have placed her on my wait list to call in the event of a cancellation.

## 2016-01-11 NOTE — Telephone Encounter (Signed)
kelly, daughter calling asking us to call her back. States she has waited all week and no one has called her back.  Stated to her pt has not been seen in a year and we would need to see her. She has been on waitlist since Monday and she keep states well no one told her this.  She would like someone to call her back.  [phone]

## 2016-01-11 NOTE — Telephone Encounter (Addendum)
Spoke w/ Tresa EndoKelly.  She reports that pt has been feeling bad x 2 weeks.  Reports dizziness, lethargy, wanting to sleep a lot, eyes are red & watery, and she has chest congestion.  She states that pt continues to smoke daily, so she is not concerned about the chest congestion. Pt does not check her BP, so she is unsure if BP is low when she feels dizzy.  She is concerned that all of these sx are r/t afib.  Advised her to contact pt's PCP regarding the majority of these sx. Recommended that she monitor pt's BP while pt is dizzy and pay attention to the HR. She is appreciative and will keep appt on Friday.  Advised her that we will call her if we have a cancellation before that time.

## 2016-01-13 ENCOUNTER — Encounter: Payer: Self-pay | Admitting: Cardiovascular Disease

## 2016-01-13 ENCOUNTER — Ambulatory Visit (INDEPENDENT_AMBULATORY_CARE_PROVIDER_SITE_OTHER): Payer: PPO | Admitting: Cardiovascular Disease

## 2016-01-13 VITALS — BP 102/58 | HR 71 | Resp 18 | Ht 64.0 in | Wt 101.5 lb

## 2016-01-13 DIAGNOSIS — F17200 Nicotine dependence, unspecified, uncomplicated: Secondary | ICD-10-CM

## 2016-01-13 DIAGNOSIS — G4700 Insomnia, unspecified: Secondary | ICD-10-CM | POA: Diagnosis not present

## 2016-01-13 DIAGNOSIS — Z720 Tobacco use: Secondary | ICD-10-CM

## 2016-01-13 DIAGNOSIS — R5383 Other fatigue: Secondary | ICD-10-CM

## 2016-01-13 DIAGNOSIS — I480 Paroxysmal atrial fibrillation: Secondary | ICD-10-CM

## 2016-01-13 DIAGNOSIS — E785 Hyperlipidemia, unspecified: Secondary | ICD-10-CM | POA: Diagnosis not present

## 2016-01-13 DIAGNOSIS — R42 Dizziness and giddiness: Secondary | ICD-10-CM

## 2016-01-13 DIAGNOSIS — I951 Orthostatic hypotension: Secondary | ICD-10-CM

## 2016-01-13 DIAGNOSIS — IMO0001 Reserved for inherently not codable concepts without codable children: Secondary | ICD-10-CM

## 2016-01-13 NOTE — Patient Instructions (Signed)
Medication Instructions:   No medication changes made  PLEASE PUSH FLUIDS, SALT  Labwork:  No new labs needed  Testing/Procedures:  No further testing at this time  FOR DIZZINESS, PLEASE CHECK PRESSURE, ESPECIALLY WITH STANDING   PLEASE CALL IF YOU WOULD LIKE A MONITOR   Follow-Up: It was a pleasure seeing you in the office today. Please call us if you have new issues that need to be addressed before your next appt.  [phone]  Your physician wants you to follow-up in: 6 months.  You will receive a reminder letter in the mail two months in advance. If you don't receive a letter, please call our office to schedule the follow-up appointment.  If you need a refill on your cardiac medications before your next appointment, please call your pharmacy.

## 2016-01-13 NOTE — Progress Notes (Signed)
Cardiology Office Note  Date:  01/13/2016   ID:  Vanessa [name], Vanessa [name] 1934-06-30, MRN [id number]  PCP:  Crawford Givens, MD   Chief Complaint  Patient presents with  . Other    FU 12 month. Patient having severe fatigue, dizziness, lightheadness, chest congestion and wheezing.    HPI:  Vanessa [name] is a very pleasant 81 year old woman with a hx of atrial fibrillation prior to her colonoscopy, h/o severe migraines at times, history of hyperlipidemia and hypothyroidism. Long history of smoking who Vanessa [name] continues to smoke 3 cigarettes a day   Vanessa [name] lost her husband. Vanessa [name] presents for routine followup of her atrial fibrillation, PAD Carotid ultrasound 2016 with less than 39% bilateral disease Chronic weight loss  Vanessa [name] presents today with her 2 daughters Weight continues to trend downward, down an additional 4 pounds Reports that Vanessa [name] eats sparingly during the daytime Tries to drink plenty of fluids  Daughters very concerned about numerous issues including dizziness/lightheadedness, episodes of near syncope, fatigue, frequent sleeping in the daytime, lethargy. They feel is something is not right in the past week or so Patient reports that Vanessa [name] feels fine but has been noticing some low blood pressures on recent measurements Heart rates typically 50s up to 60s, 70s per her measurements. Vanessa [name] does report episodes of low pressure sometimes down to 80 systolic when standing. Denies having vertigo, Denies any symptoms concerning for angina Denies any palpitations Currently not on a cholesterol medication. Cholesterol up to 200 Reports having myalgias on several cholesterol medications Vanessa [name] continues to walk her dog, up to 4 times per day, sometimes unable to walk as Vanessa [name] is lightheaded Continued mild chronic cough  Statin intolerance to the medications below Simvastatin: muscle ache Atorvastsatin: myalgia  Reports having significant sweating particularly over the summer time Daughters report Vanessa [name]  does not turn on her air-conditioning  EKG on today's visit shows normal sinus rhythm with rate 71 bpm, rare APCs, PVCs, no significant ST or T-wave changes   PMH:   has a past medical history of AF (atrial fibrillation) (HCC); Anemia; Arthritis; Cataract; Glaucoma; Hyperglycemia; Hyperlipidemia; Hypothyroidism; Insomnia; Migraines; and Osteopenia.  PSH:    Past Surgical History:  Procedure Laterality Date  . CATARACT EXTRACTION    . COLONOSCOPY  2012  . REFRACTIVE SURGERY      Current Outpatient Prescriptions  Medication Sig Dispense Refill  . ALPRAZolam (XANAX) 0.5 MG tablet Take 1-2 tablets (0.5-1 mg total) by mouth at bedtime as needed. 180 tablet 1  . aspirin 81 MG EC tablet Take 81 mg by mouth daily.      Marland Kitchen aspirin-acetaminophen-caffeine (EXCEDRIN MIGRAINE) 250-250-65 MG per tablet Take 1 tablet by mouth every 6 (six) hours as needed.      . Calcium Carbonate-Vitamin D (CALCIUM 600 + D PO) Take 600 mg by mouth 2 (two) times daily.      . Cholecalciferol (VITAMIN D PO) Take by mouth daily.      . fish oil-omega-3 fatty acids 1000 MG capsule Take 1 g by mouth daily.      Marland Kitchen latanoprost (XALATAN) 0.005 % ophthalmic solution Place 1 drop into both eyes at bedtime.    Marland Kitchen levothyroxine (SYNTHROID, LEVOTHROID) 50 MCG tablet Take 1 tablet (50 mcg total) by mouth daily. 90 tablet 3  . Multiple Vitamin (MULTIVITAMIN) tablet Take 1 tablet by mouth daily.    . traMADol (ULTRAM) 50 MG tablet Take 1.5 tablets (75 mg total) by mouth 3 (three) times daily. 405 tablet 1  .  vitamin B-12 (CYANOCOBALAMIN) 500 MCG tablet Take 500 mcg by mouth daily.     No current facility-administered medications for this visit.      Allergies:   Codeine; Cortisone; Naproxen; and Simvastatin   Social History:  The patient  reports that Vanessa [name] has been smoking Cigarettes.  Vanessa [name] has a 1.50 pack-year smoking history. Vanessa [name] has never used smokeless tobacco. Vanessa [name] reports that Vanessa [name] does not drink alcohol or use drugs.    Family History:   family history includes Diabetes in her father; Migraines in her mother; Stroke in her mother.    Review of Systems: Review of Systems  Constitutional: Positive for malaise/fatigue and weight loss.  Respiratory: Negative.   Cardiovascular: Negative.   Gastrointestinal: Negative.   Musculoskeletal: Negative.   Neurological: Positive for weakness.  Psychiatric/Behavioral: Negative.   All other systems reviewed and are negative.    PHYSICAL EXAM: VS:  BP (!) 102/58 (BP Location: Left Arm, Patient Position: Sitting, Cuff Size: Normal)   Pulse 71   Resp 18   Ht 5\' 4"  (1.626 m)   Wt 101 lb 8 oz (46 kg)   BMI 17.42 kg/m  , BMI Body mass index is 17.42 kg/m. GEN: Well nourished, well developed, in no acute distress, thin  HEENT: normal  Neck: no JVD, carotid bruits, or masses Cardiac: RRR; no murmurs, rubs, or gallops,no edema  Respiratory:  Mildly Decreased breath sounds throughout bilaterally, normal work of breathing GI: soft, nontender, nondistended, + BS MS: no deformity or atrophy  Skin: warm and dry, no rash Neuro:  Strength and sensation are intact Psych: euthymic mood, full affect    Recent Labs: 06/30/2015: ALT 12; BUN 12; Creatinine, Ser 0.81; Potassium 4.0; Sodium 140; TSH 1.46 07/11/2015: Hemoglobin 13.1; Platelets 219.0    Lipid Panel Lab Results  Component Value Date   CHOL 200 06/30/2015   HDL 59.70 06/30/2015   LDLCALC 124 (H) 06/30/2015   TRIG 84.0 06/30/2015      Wt Readings from Last 3 Encounters:  01/13/16 101 lb 8 oz (46 kg)  07/11/15 105 lb 8 oz (47.9 kg)  07/07/15 105 lb 12 oz (48 kg)       ASSESSMENT AND PLAN:  Paroxysmal atrial fibrillation [facility](HCC) Daughters concerned Vanessa [name] is having paroxysmal atrial fibrillation episodes causing her dizziness or fatigue. Certainly possible the patient denies having any palpitations. We have offered either to week monitor or 30 day monitor. Patient would like to hold off at this  time  Hyperlipidemia Vanessa [name] is not interested in cholesterol medication at this time. Reports having myalgias on 2 statins. We did review her carotid ultrasound showing minimal disease  Smoking Continues to smoke several cigarettes per day Likely has underlying COPD though denies shortness of breath We will Dr. Para Marchuncan to see if Vanessa [name] would qualify for CT chest screening study for lung cancer  Insomnia Continues to have problems with sleeping at night, fatigue during the daytime, napping in the day. Poor sleep hygiene  Other fatigue Fatigue likely secondary to poor sleep, unable to exclude depression Chest significant weight loss over the past 2 years or 2 Not eating well. Family concerned Possible diet changes discussed with her  Dizzy Dizziness likely secondary to low blood pressure exacerbated by weight loss  Orthostasis Long discussion concerning various treatment options for orthostasis Vanessa [name] continue to monitor her blood pressure at home particularly with standing Encouraged her to increase her fluid and salt intake If blood pressure continues to run low, we could start fludrocortisone  daily  Long discussion with patient, daughters about above and various treatment options They will call back if they would like 30 day monitor  Total encounter time more than 45 minutes  Greater than 50% was spent in counseling and coordination of care with the patient  Disposition:   F/U  6 months    Signed, Dossie Arbour, M.D., Ph.D. 01/13/2016  [facility] [place], [place] [phone]

## 2016-01-13 NOTE — Addendum Note (Signed)
Addended by: Rhea BeltonMOODY, Jashon Ishida R on: 01/13/2016 03:45 PM   Modules accepted: Orders

## 2016-01-14 ENCOUNTER — Telehealth: Payer: Self-pay | Admitting: Family Medicine

## 2016-01-14 NOTE — Telephone Encounter (Signed)
Shirlee LimerickMarion, would patient qualify for the lung CA screening/CT program?  Please let me know or let me know whom I should contact, so I can potentially offer this to the patient.  Thanks.

## 2016-01-16 ENCOUNTER — Telehealth: Payer: Self-pay | Admitting: Cardiovascular Disease

## 2016-01-16 NOTE — Telephone Encounter (Signed)
Nicholaus BloomKelley called checking on th ct scan.   Best number [phone]

## 2016-01-16 NOTE — Telephone Encounter (Signed)
You mentioned in note 01/13/16: "If blood pressure continues to run low, we could start fludrocortisone daily" What dose would you like her on?

## 2016-01-16 NOTE — Telephone Encounter (Signed)
Spoke with daughter.  Scheduled appt with Dr. Para Marchuncan on 01/17/16 for evaluation for CT scheduling.

## 2016-01-16 NOTE — Telephone Encounter (Signed)
Unfortunately your patient is 81 years old and to qualify she had to be between 9855 and 79 years old when you have a Medicare plan.So, no she will not qualify. I made you a copy of the guidelines and left it on your desk.

## 2016-01-16 NOTE — Telephone Encounter (Signed)
Pt daughter is calling states that pt has changed her mind and does want a rx to increase BP. [facility] Daugter states it is ok to leave a message.

## 2016-01-16 NOTE — Telephone Encounter (Signed)
Patient's daughter [name](Kelly) left a message on voicemail that she called earlier today regarding scheduling a CT for her mom per Dr. Mariah MillingGollan. Tresa EndoKelly stated that her mom is not feeling well and would like a cal back.

## 2016-01-16 NOTE — Telephone Encounter (Signed)
Please what details you can get.   See below.   She doesn't qualify for lung cancer screening CT.  We can still get a CT done, but with the dx of weight loss and smoking hx instead of screening.  I've not seen her since early 2017, with the plan at that point for her to update me if she wasn't feeling well.  OV would be reasonable.   Thanks.

## 2016-01-17 ENCOUNTER — Encounter: Payer: Self-pay | Admitting: Family Medicine

## 2016-01-17 ENCOUNTER — Ambulatory Visit (INDEPENDENT_AMBULATORY_CARE_PROVIDER_SITE_OTHER): Payer: PPO | Admitting: Family Medicine

## 2016-01-17 VITALS — BP 104/70 | HR 66 | Temp 98.7°F | Wt 102.5 lb

## 2016-01-17 DIAGNOSIS — R634 Abnormal weight loss: Secondary | ICD-10-CM

## 2016-01-17 DIAGNOSIS — I959 Hypotension, unspecified: Secondary | ICD-10-CM

## 2016-01-17 LAB — CBC WITH DIFFERENTIAL/PLATELET
BASOS ABS: 0 10*3/uL (ref 0.0–0.1)
BASOS PCT: 0.2 % (ref 0.0–3.0)
EOS ABS: 0 10*3/uL (ref 0.0–0.7)
Eosinophils Relative: 0.2 % (ref 0.0–5.0)
HCT: 35.4 % — ABNORMAL LOW (ref 36.0–46.0)
HEMOGLOBIN: 11.9 g/dL — AB (ref 12.0–15.0)
Lymphocytes Relative: 8.8 % — ABNORMAL LOW (ref 12.0–46.0)
Lymphs Abs: 1.3 10*3/uL (ref 0.7–4.0)
MCHC: 33.5 g/dL (ref 30.0–36.0)
MCV: 90.5 fl (ref 78.0–100.0)
MONO ABS: 0.8 10*3/uL (ref 0.1–1.0)
Monocytes Relative: 5.9 % (ref 3.0–12.0)
NEUTROS ABS: 12 10*3/uL — AB (ref 1.4–7.7)
NEUTROS PCT: 84.9 % — AB (ref 43.0–77.0)
Platelets: 483 10*3/uL — ABNORMAL HIGH (ref 150.0–400.0)
RBC: 3.91 Mil/uL (ref 3.87–5.11)
RDW: 13.9 % (ref 11.5–15.5)
WBC: 14.2 10*3/uL — AB (ref 4.0–10.5)

## 2016-01-17 LAB — COMPREHENSIVE METABOLIC PANEL
ALBUMIN: 3.2 g/dL — AB (ref 3.5–5.2)
ALK PHOS: 51 U/L (ref 39–117)
ALT: 11 U/L (ref 0–35)
AST: 20 U/L (ref 0–37)
BILIRUBIN TOTAL: 0.4 mg/dL (ref 0.2–1.2)
BUN: 9 mg/dL (ref 6–23)
CO2: 33 mEq/L — ABNORMAL HIGH (ref 19–32)
Calcium: 9.1 mg/dL (ref 8.4–10.5)
Chloride: 100 mEq/L (ref 96–112)
Creatinine, Ser: 0.65 mg/dL (ref 0.40–1.20)
GFR: 93.39 mL/min (ref >60.00)
GLUCOSE: 104 mg/dL — AB (ref 70–99)
POTASSIUM: 4.3 meq/L (ref 3.5–5.1)
SODIUM: 139 meq/L (ref 135–145)
TOTAL PROTEIN: 6.7 g/dL (ref 6.0–8.3)

## 2016-01-17 LAB — TSH: TSH: 2.04 u[IU]/mL (ref 0.35–4.50)

## 2016-01-17 MED ORDER — TRAMADOL HCL 50 MG PO TABS
75.0000 mg | ORAL_TABLET | Freq: Three times a day (TID) | ORAL | 1 refills | Status: DC
Start: 2016-01-17 — End: 2016-04-18

## 2016-01-17 NOTE — Progress Notes (Signed)
Pre visit review using our clinic review tool, if applicable. No additional management support is needed unless otherwise documented below in the visit note. 

## 2016-01-17 NOTE — Progress Notes (Signed)
For the last 2 weeks, more lightheaded.  She isn't driving. She is "unsteady, wobbly" with walking.  No sx seated but worse when she gets up.  No falls.  She gave up driving recently.  No vertigo sx.    BP has been low at home, on mult checks.  Cardiology has mentioned fludrocortisone trial.  She is not yet on that medication. She has not had recent labs drawn.  She has had chest congestion, more recently.    She needed refill on tramadol used for HA. No adverse effect of medication.  PMH and SH reviewed  ROS: Per HPI unless specifically indicated in ROS section   Meds, vitals, and allergies reviewed.   GEN: nad, alert and oriented, thin HEENT: mucous membranes moist NECK: supple w/o LA CV: rrr.  PULM: ctab, no inc wob ABD: soft, +bs EXT: no edema SKIN: no acute rash No orthostatic sx on standing at OV.   Gait symmetric.

## 2016-01-17 NOTE — Patient Instructions (Signed)
Labs today, let me talk to Dr. Mariah MillingGollan about fludrocortisone.  If you want to get the chest CT done then let me know.  If you want to get the heart monitor done, then let Dr. Mariah MillingGollan know.  Please let me know if you aren't getting better.  Mirtazapine may be reasonable to help with your appetite.   Take care.  Glad to see you.

## 2016-01-17 NOTE — Telephone Encounter (Signed)
Pt daughter would like a call when this medication is called in.

## 2016-01-18 ENCOUNTER — Other Ambulatory Visit: Payer: Self-pay | Admitting: Family Medicine

## 2016-01-18 DIAGNOSIS — I959 Hypotension, unspecified: Secondary | ICD-10-CM | POA: Insufficient documentation

## 2016-01-18 MED ORDER — FLUDROCORTISONE ACETATE 0.1 MG PO TABS: 0.1000 mg | ORAL_TABLET | Freq: Every day | ORAL | 1 refills | Status: DC

## 2016-01-18 NOTE — Telephone Encounter (Signed)
Would start fludrocortisone 0.1 mg every other day After 2 weeks if blood pressure continues to run low, could increase up to daily Would send in prescription for 0.1 mg daily as needed If she develops ankle swelling, may need to decrease the dose Would monitor blood pressure closely tickly when standing Encourage fluids, salt intake

## 2016-01-18 NOTE — Assessment & Plan Note (Signed)
Discussed with patient about her options. She has weight loss noted. Weight loss is likely affecting her low pressure. It is unclear to me if her mood is depressed and then that has made her feel worse, affected her appetite, affected her functional capacity. It could be the way around, where she has gotten ill from range of potential sources, and that in combination with the loss of several family members has made her depressed/grieving. This was discussed with patient. She is sad about the loss of family members of last few years but does not give a history typical severe depression with suicidal or homicidal intent. At this point she is still okay for outpatient follow-up. We talked about her goals. We talked about potential CT scan and workup for weight loss. We agreed to get basic labs done today. If the labs are unremarkable and if she is not improving, she could consider CT scan of the chest given her smoking history. We talked about the potential for an occult malignancy. She is not certain that she would want to know about/want to treat a malignancy, if it should exist. She is going to consider her options in the meantime. All of this was discussed at the office visit with the patient and with her daughter present. In the meantime we will check her labs and I will check with cardiology about a trial of fludrocortisone. If she wants to proceed with imaging she can let me know. If she wishes to follow-up about cardiac rhythm monitoring, then she can notify cardiology. Another option would be to add on mirtazapine, to help her appetite and potentially with weight gain. This was discussed. We did not start this at office visit because we wanted to get her labs done first for the consideration of fludrocortisone. See after visit summary. I am going to route this note cardiology as an FYI. >25 minutes spent in face to face time with patient, >50% spent in counselling or coordination of care.

## 2016-01-19 NOTE — Telephone Encounter (Signed)
[facility]. Windell HummingbirdGollan's recommendations. Looks like Dr. Para Marchuncan sent this med in yesterday.  Asked her to call back w/ any questions or concerns.

## 2016-01-23 ENCOUNTER — Encounter: Payer: Self-pay | Admitting: Family Medicine

## 2016-01-23 ENCOUNTER — Encounter: Payer: Self-pay | Admitting: Cardiovascular Disease

## 2016-02-20 ENCOUNTER — Telehealth: Payer: Self-pay | Admitting: Cardiovascular Disease

## 2016-02-20 NOTE — Telephone Encounter (Signed)
Lmom to call our office to schedule a carotid u/s (1 yr f/u). 

## 2016-02-21 ENCOUNTER — Ambulatory Visit: Payer: PPO

## 2016-02-21 ENCOUNTER — Other Ambulatory Visit: Payer: Self-pay | Admitting: Family Medicine

## 2016-02-21 DIAGNOSIS — R270 Ataxia, unspecified: Secondary | ICD-10-CM

## 2016-02-21 DIAGNOSIS — R42 Dizziness and giddiness: Secondary | ICD-10-CM

## 2016-02-28 DIAGNOSIS — H401131 Primary open-angle glaucoma, bilateral, mild stage: Secondary | ICD-10-CM | POA: Diagnosis not present

## 2016-03-06 DIAGNOSIS — H401131 Primary open-angle glaucoma, bilateral, mild stage: Secondary | ICD-10-CM | POA: Diagnosis not present

## 2016-03-21 ENCOUNTER — Telehealth: Payer: Self-pay | Admitting: Family Medicine

## 2016-03-21 NOTE — Telephone Encounter (Signed)
LVM for pt to call back and schedule AWV + labs with Lesia after 07/06/16 and CPE with PCP.

## 2016-04-18 ENCOUNTER — Other Ambulatory Visit: Payer: Self-pay | Admitting: Family Medicine

## 2016-04-18 NOTE — Telephone Encounter (Signed)
Last filled 01-19-16 #405 Last OV 01-17-16 Next OV 07-17-16  Forwarding to Dr Sharen HonesGutierrez in Dr Lianne Bushyuncan's absence

## 2016-04-18 NOTE — Telephone Encounter (Signed)
plz phone in. Has been on this dose previously.   Cc PCP.

## 2016-04-18 NOTE — Telephone Encounter (Signed)
Verbal refill given to [name] at the pharmacy 

## 2016-07-08 ENCOUNTER — Other Ambulatory Visit: Payer: Self-pay | Admitting: Family Medicine

## 2016-07-08 DIAGNOSIS — E785 Hyperlipidemia, unspecified: Secondary | ICD-10-CM

## 2016-07-08 DIAGNOSIS — M8580 Other specified disorders of bone density and structure, unspecified site: Secondary | ICD-10-CM

## 2016-07-08 DIAGNOSIS — I480 Paroxysmal atrial fibrillation: Secondary | ICD-10-CM

## 2016-07-10 ENCOUNTER — Ambulatory Visit (INDEPENDENT_AMBULATORY_CARE_PROVIDER_SITE_OTHER): Payer: PPO

## 2016-07-10 VITALS — BP 98/70 | HR 94 | Temp 98.6°F | Ht 61.5 in | Wt 102.5 lb

## 2016-07-10 DIAGNOSIS — Z0000 Encounter for general adult medical examination without abnormal findings: Secondary | ICD-10-CM

## 2016-07-10 DIAGNOSIS — E785 Hyperlipidemia, unspecified: Secondary | ICD-10-CM | POA: Diagnosis not present

## 2016-07-10 DIAGNOSIS — I480 Paroxysmal atrial fibrillation: Secondary | ICD-10-CM

## 2016-07-10 DIAGNOSIS — M8580 Other specified disorders of bone density and structure, unspecified site: Secondary | ICD-10-CM | POA: Diagnosis not present

## 2016-07-10 LAB — TSH: TSH: 1.22 u[IU]/mL (ref 0.35–4.50)

## 2016-07-10 LAB — CBC WITH DIFFERENTIAL/PLATELET
BASOS ABS: 0 10*3/uL (ref 0.0–0.1)
Basophils Relative: 0.5 % (ref 0.0–3.0)
EOS ABS: 0.1 10*3/uL (ref 0.0–0.7)
Eosinophils Relative: 2 % (ref 0.0–5.0)
HEMATOCRIT: 44.4 % (ref 36.0–46.0)
Hemoglobin: 14.6 g/dL (ref 12.0–15.0)
Lymphocytes Relative: 16.2 % (ref 12.0–46.0)
Lymphs Abs: 1.2 10*3/uL (ref 0.7–4.0)
MCHC: 32.8 g/dL (ref 30.0–36.0)
MCV: 91.8 fl (ref 78.0–100.0)
MONOS PCT: 6.9 % (ref 3.0–12.0)
Monocytes Absolute: 0.5 10*3/uL (ref 0.1–1.0)
NEUTROS ABS: 5.3 10*3/uL (ref 1.4–7.7)
Neutrophils Relative %: 74.4 % (ref 43.0–77.0)
Platelets: 259 10*3/uL (ref 150.0–400.0)
RBC: 4.84 Mil/uL (ref 3.87–5.11)
RDW: 14.2 % (ref 11.5–15.5)
WBC: 7.2 10*3/uL (ref 4.0–10.5)

## 2016-07-10 LAB — LIPID PANEL
CHOLESTEROL: 202 mg/dL — AB (ref 0–200)
HDL: 66.9 mg/dL (ref >39.00)
LDL Cholesterol: 117 mg/dL — ABNORMAL HIGH (ref 0–99)
NONHDL: 135.02
Total CHOL/HDL Ratio: 3
Triglycerides: 91 mg/dL (ref 0.0–149.0)
VLDL: 18.2 mg/dL (ref 0.0–40.0)

## 2016-07-10 LAB — COMPREHENSIVE METABOLIC PANEL
ALBUMIN: 4.3 g/dL (ref 3.5–5.2)
ALK PHOS: 38 U/L — AB (ref 39–117)
ALT: 11 U/L (ref 0–35)
AST: 25 U/L (ref 0–37)
BILIRUBIN TOTAL: 0.6 mg/dL (ref 0.2–1.2)
BUN: 15 mg/dL (ref 6–23)
CO2: 27 mEq/L (ref 19–32)
Calcium: 10 mg/dL (ref 8.4–10.5)
Chloride: 99 mEq/L (ref 96–112)
Creatinine, Ser: 0.83 mg/dL (ref 0.40–1.20)
GFR: 70.35 mL/min (ref >60.00)
Glucose, Bld: 137 mg/dL — ABNORMAL HIGH (ref 70–99)
POTASSIUM: 4.1 meq/L (ref 3.5–5.1)
SODIUM: 134 meq/L — AB (ref 135–145)
TOTAL PROTEIN: 7 g/dL (ref 6.0–8.3)

## 2016-07-10 LAB — VITAMIN D 25 HYDROXY (VIT D DEFICIENCY, FRACTURES): VITD: 41.48 ng/mL (ref 30.00–100.00)

## 2016-07-10 NOTE — Progress Notes (Signed)
PCP notes:  Health maintenance:  Mammogram - pt declined  Abnormal screenings:   Fall risk - hx of fall without injury  Patient concerns:   None  Nurse concerns:  None  Next PCP appt:   07/17/16 @ 0945  I reviewed health advisor's note, was available for consultation on the day of service listed in this note, and agree with documentation and plan. Crawford GivensGraham Duncan, MD.

## 2016-07-10 NOTE — Progress Notes (Signed)
Subjective:   Vanessa [name] is a 82 y.o. female who presents for Medicare Annual (Subsequent) preventive examination.  Review of Systems:  N/A Cardiac Risk Factors include: advanced age (>85men, >15 women);smoking/ tobacco exposure;dyslipidemia     Objective:     Vitals: BP 98/70 (BP Location: Right Arm, Patient Position: Sitting, Cuff Size: Small)   Pulse 94   Temp 98.6 F (37 C) (Oral)   Ht 5' 1.5" (1.562 m) Comment: no shoes  Wt 102 lb 8 oz (46.5 kg)   SpO2 98%   BMI 19.05 kg/m   Body mass index is 19.05 kg/m.   Tobacco History  Smoking Status  . Current Every Day Smoker  . Packs/day: 0.25  . Years: 6.00  . Types: Cigarettes  Smokeless Tobacco  . Never Used    Comment: 1-2 cigarettes daily     Ready to quit: No Counseling given: No   Past Medical History:  Diagnosis Date  . AF (atrial fibrillation) (HCC)    during colonoscopy  . Anemia    treated ~2009 with iron, resolved  . Arthritis   . Cataract    surgery on R catarct 2012  . Glaucoma   . Hyperglycemia    mild inc in fasting sugar  . Hyperlipidemia   . Hypothyroidism   . Insomnia   . Migraines    without aura, sx since age 14  . Osteopenia    prev with 10 years of fosamax and intolerant of evista   Past Surgical History:  Procedure Laterality Date  . CATARACT EXTRACTION    . COLONOSCOPY  2012  . REFRACTIVE SURGERY     Family History  Problem Relation Age of Onset  . Diabetes Father   . Migraines Mother   . Stroke Mother     TIAs  . Breast cancer Neg Hx   . Colon cancer Neg Hx    History  Sexual Activity  . Sexual activity: No    Outpatient Encounter Prescriptions as of 07/10/2016  Medication Sig  . ALPRAZolam (XANAX) 0.5 MG tablet Take 1-2 tablets (0.5-1 mg total) by mouth at bedtime as needed.  Marland Kitchen aspirin 81 MG EC tablet Take 81 mg by mouth daily.    Marland Kitchen aspirin-acetaminophen-caffeine (EXCEDRIN MIGRAINE) 250-250-65 MG per tablet Take 1 tablet by mouth every 6 (six) hours as  needed.    . Calcium Carbonate-Vitamin D (CALCIUM 600 + D PO) Take 600 mg by mouth 2 (two) times daily.    . Cholecalciferol (VITAMIN D PO) Take by mouth daily.    . fish oil-omega-3 fatty acids 1000 MG capsule Take 1 g by mouth daily.    Marland Kitchen latanoprost (XALATAN) 0.005 % ophthalmic solution Place 1 drop into both eyes at bedtime.  Marland Kitchen levothyroxine (SYNTHROID, LEVOTHROID) 50 MCG tablet Take 1 tablet (50 mcg total) by mouth daily.  . Multiple Vitamin (MULTIVITAMIN) tablet Take 1 tablet by mouth daily.  . traMADol (ULTRAM) 50 MG tablet TAKE ONE AND ONE-HALF TABLETS THREE TIMES A DAY  . [DISCONTINUED] fludrocortisone (FLORINEF) 0.1 MG tablet Take 1 tablet (0.1 mg total) by mouth daily. Drink plenty of water with medicine  . [DISCONTINUED] vitamin B-12 (CYANOCOBALAMIN) 500 MCG tablet Take 500 mcg by mouth daily.   No facility-administered encounter medications on file as of 07/10/2016.     Activities of Daily Living In your present state of health, do you have any difficulty performing the following activities: 07/10/2016  Hearing? N  Vision? N  Difficulty concentrating or making  decisions? N  Walking or climbing stairs? N  Dressing or bathing? N  Doing errands, shopping? N  Preparing Food and eating ? N  Using the Toilet? N  In the past six months, have you accidently leaked urine? N  Do you have problems with loss of bowel control? N  Managing your Medications? N  Managing your Finances? N  Housekeeping or managing your Housekeeping? N  Some recent data might be hidden    Patient Care Team: Joaquim NamGraham S Duncan, MD as PCP - General (Family Medicine) Lockie Molahadwick Brasington, MD as Referring Physician (Ophthalmology) Antonieta Ibaimothy J Gollan, MD as Consulting Physician (Cardiology)    Assessment:     Hearing Screening   125Hz  250Hz  500Hz  1000Hz  2000Hz  3000Hz  4000Hz  6000Hz  8000Hz   Right ear:   40 40 40  40    Left ear:   40 40 40  40    Vision Screening Comments: Last exam in November  2017   Exercise Activities and Dietary recommendations Current Exercise Habits: Home exercise routine, Type of exercise: walking, Time (Minutes): 60, Frequency (Times/Week): 7, Weekly Exercise (Minutes/Week): 420, Intensity: Mild, Exercise limited by: None identified  Goals    . Increase physical activity          When weather permits, I will continue to walk at least 60 min daily.       Fall Risk Fall Risk  07/10/2016 07/07/2015 05/24/2014 05/20/2013 05/19/2012  Falls in the past year? Yes No Yes Yes No  Number falls in past yr: 1 - - 1 -  Injury with Fall? No - - No -   Depression Screen PHQ 2/9 Scores 07/10/2016 07/07/2015 05/24/2014 05/20/2013  PHQ - 2 Score 0 0 6 1     Cognitive Function MMSE - Mini Mental State Exam 07/10/2016 07/07/2015  Orientation to time 5 5  Orientation to Place 5 5  Registration 3 3  Attention/ Calculation 0 5  Recall 3 3  Language- name 2 objects 0 0  Language- repeat 1 1  Language- follow 3 step command 3 3  Language- read & follow direction 0 1  Write a sentence 0 0  Copy design 0 0  Total score 20 26       PLEASE NOTE: A Mini-Cog screen was completed. Maximum score is 20. A value of 0 denotes this part of Folstein MMSE was not completed or the patient failed this part of the Mini-Cog screening.   Mini-Cog Screening Orientation to Time - Max 5 pts Orientation to Place - Max 5 pts Registration - Max 3 pts Recall - Max 3 pts Language Repeat - Max 1 pts Language Follow 3 Step Command - Max 3 pts   Immunization History  Administered Date(s) Administered  . Influenza Split 01/25/2011, 01/24/2012, 01/21/2013  . Influenza-Unspecified 01/20/2014, 01/19/2016  . Pneumococcal Conjugate-13 05/24/2014  . Pneumococcal Polysaccharide-23 05/14/2011  . Tdap 01/07/2012   Screening Tests Health Maintenance  Topic Date Due  . MAMMOGRAM  07/10/2025 (Originally 08/13/2014)  . TETANUS/TDAP  01/06/2022  . INFLUENZA VACCINE  Addressed  . DEXA SCAN  Completed   . PNA vac Low Risk Adult  Completed      Plan:     I have personally reviewed and addressed the Medicare Annual Wellness questionnaire and have noted the following in the patient's chart:  A. Medical and social history B. Use of alcohol, tobacco or illicit drugs  C. Current medications and supplements D. Functional ability and status E.  Nutritional status F.  Physical activity G. Advance directives H. List of other physicians I.  Hospitalizations, surgeries, and ER visits in previous 12 months J.  Vitals K. Screenings to include hearing, vision, cognitive, depression L. Referrals and appointments - none  In addition, I have reviewed and discussed with patient certain preventive protocols, quality metrics, and best practice recommendations. A written personalized care plan for preventive services as well as general preventive health recommendations were provided to patient.  See attached scanned questionnaire for additional information.   Signed,   Randa Evens, MHA, BS, LPN Health Coach

## 2016-07-10 NOTE — Patient Instructions (Signed)
[name] , Thank you for taking time to come for your Medicare Wellness Visit. I appreciate your ongoing commitment to your health goals. Please review the following plan we discussed and let me know if I can assist you in the future.   These are the goals we discussed: Goals    . Increase physical activity          When weather permits, I will continue to walk at least 60 min daily.        This is a list of the screening recommended for you and due dates:  Health Maintenance  Topic Date Due  . Mammogram  07/10/2025*  . Tetanus Vaccine  01/06/2022  . Flu Shot  Addressed  . DEXA scan (bone density measurement)  Completed  . Pneumonia vaccines  Completed  *Topic was postponed. The date shown is not the original due date.   Preventive Care for Adults  A healthy lifestyle and preventive care can promote health and wellness. Preventive health guidelines for adults include the following key practices.  . A routine yearly physical is a good way to check with your health care provider about your health and preventive screening. It is a chance to share any concerns and updates on your health and to receive a thorough exam.  . Visit your dentist for a routine exam and preventive care every 6 months. Brush your teeth twice a day and floss once a day. Good oral hygiene prevents tooth decay and gum disease.  . The frequency of eye exams is based on your age, health, family medical history, use  of contact lenses, and other factors. Follow your health care provider's ecommendations for frequency of eye exams.  . Eat a healthy diet. Foods like vegetables, fruits, whole grains, low-fat dairy products, and lean protein foods contain the nutrients you need without too many calories. Decrease your intake of foods high in solid fats, added sugars, and salt. Eat the right amount of calories for you. Get information about a proper diet from your health care provider, if necessary.  . Regular  physical exercise is one of the most important things you can do for your health. Most adults should get at least 150 minutes of moderate-intensity exercise (any activity that increases your heart rate and causes you to sweat) each week. In addition, most adults need muscle-strengthening exercises on 2 or more days a week.  Silver Sneakers may be a benefit available to you. To determine eligibility, you may visit the website: www.silversneakers.com or contact program at [phone] Mon-Fri between 8AM-8PM.   . Maintain a healthy weight. The body mass index (BMI) is a screening tool to identify possible weight problems. It provides an estimate of body fat based on height and weight. Your health care provider can find your BMI and can help you achieve or maintain a healthy weight.   For adults 20 years and older: ? A BMI below 18.5 is considered underweight. ? A BMI of 18.5 to 24.9 is normal. ? A BMI of 25 to 29.9 is considered overweight. ? A BMI of 30 and above is considered obese.   . Maintain normal blood lipids and cholesterol levels by exercising and minimizing your intake of saturated fat. Eat a balanced diet with plenty of fruit and vegetables. Blood tests for lipids and cholesterol should begin at age 80 and be repeated every 5 years. If your lipid or cholesterol levels are high, you are over 50, or you are at high risk  for heart disease, you may need your cholesterol levels checked more frequently. Ongoing high lipid and cholesterol levels should be treated with medicines if diet and exercise are not working.  . If you smoke, find out from your health care provider how to quit. If you do not use tobacco, please do not start.  . If you choose to drink alcohol, please do not consume more than 2 drinks per day. One drink is considered to be 12 ounces (355 mL) of beer, 5 ounces (148 mL) of wine, or 1.5 ounces (44 mL) of liquor.  . If you are 80-7 years old, ask your health care provider if  you should take aspirin to prevent strokes.  . Use sunscreen. Apply sunscreen liberally and repeatedly throughout the day. You should seek shade when your shadow is shorter than you. Protect yourself by wearing long sleeves, pants, a wide-brimmed hat, and sunglasses year round, whenever you are outdoors.  . Once a month, do a whole body skin exam, using a mirror to look at the skin on your back. Tell your health care provider of new moles, moles that have irregular borders, moles that are larger than a pencil eraser, or moles that have changed in shape or color.

## 2016-07-10 NOTE — Progress Notes (Signed)
Pre visit review using our clinic review tool, if applicable. No additional management support is needed unless otherwise documented below in the visit note. 

## 2016-07-17 ENCOUNTER — Encounter: Payer: Self-pay | Admitting: Family Medicine

## 2016-07-17 ENCOUNTER — Ambulatory Visit (INDEPENDENT_AMBULATORY_CARE_PROVIDER_SITE_OTHER): Payer: PPO | Admitting: Family Medicine

## 2016-07-17 VITALS — BP 136/70 | HR 56 | Temp 98.8°F | Wt 106.0 lb

## 2016-07-17 DIAGNOSIS — R739 Hyperglycemia, unspecified: Secondary | ICD-10-CM | POA: Diagnosis not present

## 2016-07-17 DIAGNOSIS — I6523 Occlusion and stenosis of bilateral carotid arteries: Secondary | ICD-10-CM | POA: Diagnosis not present

## 2016-07-17 DIAGNOSIS — M8580 Other specified disorders of bone density and structure, unspecified site: Secondary | ICD-10-CM

## 2016-07-17 DIAGNOSIS — E039 Hypothyroidism, unspecified: Secondary | ICD-10-CM | POA: Diagnosis not present

## 2016-07-17 DIAGNOSIS — I480 Paroxysmal atrial fibrillation: Secondary | ICD-10-CM

## 2016-07-17 DIAGNOSIS — Z7189 Other specified counseling: Secondary | ICD-10-CM | POA: Diagnosis not present

## 2016-07-17 DIAGNOSIS — G43009 Migraine without aura, not intractable, without status migrainosus: Secondary | ICD-10-CM | POA: Diagnosis not present

## 2016-07-17 DIAGNOSIS — G4700 Insomnia, unspecified: Secondary | ICD-10-CM | POA: Diagnosis not present

## 2016-07-17 DIAGNOSIS — E785 Hyperlipidemia, unspecified: Secondary | ICD-10-CM | POA: Diagnosis not present

## 2016-07-17 MED ORDER — ALPRAZOLAM 0.5 MG PO TABS: 0.5000 mg | ORAL_TABLET | Freq: Every evening | ORAL | 1 refills | Status: DC | PRN

## 2016-07-17 MED ORDER — TRAMADOL HCL 50 MG PO TABS: ORAL_TABLET | ORAL | 1 refills | Status: DC

## 2016-07-17 MED ORDER — LEVOTHYROXINE SODIUM 50 MCG PO TABS: 50.0000 ug | ORAL_TABLET | Freq: Every day | ORAL | 3 refills | Status: DC

## 2016-07-17 NOTE — Patient Instructions (Signed)
Check A1c after 3 months.  Nonfasting lab visit.  Cut back on sweets in the meantime.  Take care.  Glad to see you.  Update me as needed.

## 2016-07-17 NOTE — Progress Notes (Signed)
Pre visit review using our clinic review tool, if applicable. No additional management support is needed unless otherwise documented below in the visit note. 

## 2016-07-17 NOTE — Progress Notes (Signed)
Health maintenance:  Mammogram - pt declined.  D/w pt.  She wouldn't want tx for a finding, d/w pt.   Fall risk - hx of fall without injury.  Cautions d/w pt.   DXA d/w pt. s/p ~ 10 years of fosamax tx.  Declined DXA for now.  D/w pt.   Smoking cessation encouraged.  D/w pt.  precontemplative.  A few cigs a day.    Colonoscopy not needed now with prev h/o A fib and age.  Discussed with patient. She agrees.  Advance directive- daughter Vanessa [name] designated if patient were incapacitated.   Hypothyroidism.  On replacement.  No neck mass.  No dysphagia.    Migraines.  Chronic.  Ongoing.  Worse with weather changes.  This is her baseline.  Much improved with tramadol use.  No ADE on med.  She can get by as is with current med/dose.   Xanax used nightly, no ADE on med.  Compliant.  H/o AF.  Sounds to be NSR now.  D/w pt.  No CP, SOB, BLE edema.  Statin intolerant.    H/o minimal carotid stenosis.  Minimal stenosis on recheck on last exam.  D/w pt.    Glaucoma per eye clinic.  The prev dizziness resolved when she got better off OTC cold medicine.    Hyperglycemia, d/w pt about cutting back on sweets.  Labs d/w pt.    PMH and SH reviewed  ROS: Per HPI unless specifically indicated in ROS section   Meds, vitals, and allergies reviewed.   GEN: nad, alert and oriented HEENT: mucous membranes moist NECK: supple w/o LA CV: rrr.  no murmur PULM: ctab, no inc wob ABD: soft, +bs EXT: no edema SKIN: no acute rash

## 2016-07-19 NOTE — Assessment & Plan Note (Signed)
Advance directive- daughter Karla [name] designated if patient were incapacitated.  

## 2016-07-19 NOTE — Assessment & Plan Note (Signed)
Sounds to be normal sinus rhythm. Continue as is. She agrees.

## 2016-07-19 NOTE — Assessment & Plan Note (Signed)
Controlled with Xanax at night. No adverse effect. Continue as is.

## 2016-07-19 NOTE — Assessment & Plan Note (Signed)
Reasonable to defer follow-up DEXA. Vitamin D reasonable. Discussed with patient.

## 2016-07-19 NOTE — Assessment & Plan Note (Signed)
Labs discussed with patient. She will cut back on sweets. Diet discussed with patient. Recheck A1c later this year. She agrees.

## 2016-07-19 NOTE — Assessment & Plan Note (Signed)
Minimal stenosis on last ultrasound. Continue as is. Discussed with patient. She agrees.

## 2016-07-19 NOTE — Assessment & Plan Note (Signed)
High HDL noted. Statin intolerant. Continue as is. Smoking cessation discussed with patient.

## 2016-07-19 NOTE — Assessment & Plan Note (Signed)
On replacement.  No neck mass.  No dysphagia.  TSH wnl.  D/w pt. continue as is.

## 2016-07-19 NOTE — Assessment & Plan Note (Signed)
Tolerable with current dose of tramadol. Update me if changes noted. She agrees.

## 2016-08-02 DIAGNOSIS — H2512 Age-related nuclear cataract, left eye: Secondary | ICD-10-CM | POA: Diagnosis not present

## 2016-08-08 ENCOUNTER — Telehealth: Payer: Self-pay | Admitting: *Deleted

## 2016-08-08 NOTE — Telephone Encounter (Signed)
Called pt to schedule recall.  Patient states she is doing ok. Feels as though she doesn't need the f/u but wants to check and make sure Dr. Mariah [name] is ok with her seeing him in a year instead..  Patient  Is requesting a call back. Her number is [phone]  Please advise. Thank you

## 2016-08-08 NOTE — Telephone Encounter (Signed)
Left message on pt's home VM

## 2016-08-10 DIAGNOSIS — H2512 Age-related nuclear cataract, left eye: Secondary | ICD-10-CM | POA: Diagnosis not present

## 2016-08-20 ENCOUNTER — Encounter: Payer: Self-pay | Admitting: *Deleted

## 2016-08-23 NOTE — Discharge Instructions (Signed)
Cataract Surgery, Care After °Refer to this sheet in the next few weeks. These instructions provide you with information about caring for yourself after your procedure. Your health care provider may also give you more specific instructions. Your treatment has been planned according to current medical practices, but problems sometimes occur. Call your health care provider if you have any problems or questions after your procedure. °What can I expect after the procedure? °After the procedure, it is common to have: °· Itching. °· Discomfort. °· Fluid discharge. °· Sensitivity to light and to touch. °· Bruising. °Follow these instructions at home: °Eye Care  °· Check your eye every day for signs of infection. Watch for: °¨ Redness, swelling, or pain. °¨ Fluid, blood, or pus. °¨ Warmth. °¨ Bad smell. °Activity  °· Avoid strenuous activities, such as playing contact sports, for as long as told by your health care provider. °· Do not drive or operate heavy machinery until your health care provider approves. °· Do not bend or lift heavy objects . Bending increases pressure in the eye. You can walk, climb stairs, and do light household chores. °· Ask your health care provider when you can return to work. If you work in a dusty environment, you may be advised to wear protective eyewear for a period of time. °General instructions  °· Take or apply over-the-counter and prescription medicines only as told by your health care provider. This includes eye drops. °· Do not touch or rub your eyes. °· If you were given a protective shield, wear it as told by your health care provider. If you were not given a protective shield, wear sunglasses as told by your health care provider to protect your eyes. °· Keep the area around your eye clean and dry. Avoid swimming or allowing water to hit you directly in the face while showering until told by your health care provider. Keep soap and shampoo out of your eyes. °· Do not put a contact lens  into the affected eye or eyes until your health care provider approves. °· Keep all follow-up visits as told by your health care provider. This is important. °Contact a health care provider if: ° °· You have increased bruising around your eye. °· You have pain that is not helped with medicine. °· You have a fever. °· You have redness, swelling, or pain in your eye. °· You have fluid, blood, or pus coming from your incision. °· Your vision gets worse. °Get help right away if: °· You have sudden vision loss. °This information is not intended to replace advice given to you by your health care provider. Make sure you discuss any questions you have with your health care provider. °Document Released: 10/27/2004 Document Revised: 08/18/2015 Document Reviewed: 02/17/2015 °Elsevier Interactive Patient Education © 2017 [employer] ° ° ° ° °General Anesthesia, Adult, Care After °These instructions provide you with information about caring for yourself after your procedure. Your health care provider may also give you more specific instructions. Your treatment has been planned according to current medical practices, but problems sometimes occur. Call your health care provider if you have any problems or questions after your procedure. °What can I expect after the procedure? °After the procedure, it is common to have: °· Vomiting. °· A sore throat. °· Mental slowness. °It is common to feel: °· Nauseous. °· Cold or shivery. °· Sleepy. °· Tired. °· Sore or achy, even in parts of your body where you did not have surgery. °Follow these instructions at   home: °For at least 24 hours after the procedure:  °· Do not: °¨ Participate in activities where you could fall or become injured. °¨ Drive. °¨ Use heavy machinery. °¨ Drink alcohol. °¨ Take sleeping pills or medicines that cause drowsiness. °¨ Make important decisions or sign legal documents. °¨ Take care of children on your own. °· Rest. °Eating and drinking  °· If you vomit, drink  water, juice, or soup when you can drink without vomiting. °· Drink enough fluid to keep your urine clear or pale yellow. °· Make sure you have little or no nausea before eating solid foods. °· Follow the diet recommended by your health care provider. °General instructions  °· Have a responsible adult stay with you until you are awake and alert. °· Return to your normal activities as told by your health care provider. Ask your health care provider what activities are safe for you. °· Take over-the-counter and prescription medicines only as told by your health care provider. °· If you smoke, do not smoke without supervision. °· Keep all follow-up visits as told by your health care provider. This is important. °Contact a health care provider if: °· You continue to have nausea or vomiting at home, and medicines are not helpful. °· You cannot drink fluids or start eating again. °· You cannot urinate after 8-12 hours. °· You develop a skin rash. °· You have fever. °· You have increasing redness at the site of your procedure. °Get help right away if: °· You have difficulty breathing. °· You have chest pain. °· You have unexpected bleeding. °· You feel that you are having a life-threatening or urgent problem. °This information is not intended to replace advice given to you by your health care provider. Make sure you discuss any questions you have with your health care provider. °Document Released: 07/16/2000 Document Revised: 09/12/2015 Document Reviewed: 03/24/2015 °Elsevier Interactive Patient Education © 2017 [employer] ° °

## 2016-08-27 ENCOUNTER — Ambulatory Visit: Payer: PPO | Admitting: Anesthesiology

## 2016-08-27 ENCOUNTER — Encounter
Admission: RE | Disposition: A | Discharge status: Home / Self Care | Payer: Self-pay | Source: Ambulatory Visit | Attending: Ophthalmology

## 2016-08-27 ENCOUNTER — Ambulatory Visit
Admission: RE | Admit: 2016-08-27 | Discharge: 2016-08-27 | Disposition: A | Discharge status: Home / Self Care | Payer: PPO | Source: Ambulatory Visit | Attending: Ophthalmology | Admitting: Ophthalmology

## 2016-08-27 DIAGNOSIS — R51 Headache: Secondary | ICD-10-CM | POA: Insufficient documentation

## 2016-08-27 DIAGNOSIS — F17200 Nicotine dependence, unspecified, uncomplicated: Secondary | ICD-10-CM | POA: Insufficient documentation

## 2016-08-27 DIAGNOSIS — I739 Peripheral vascular disease, unspecified: Secondary | ICD-10-CM | POA: Diagnosis not present

## 2016-08-27 DIAGNOSIS — E039 Hypothyroidism, unspecified: Secondary | ICD-10-CM | POA: Insufficient documentation

## 2016-08-27 DIAGNOSIS — H2512 Age-related nuclear cataract, left eye: Secondary | ICD-10-CM | POA: Insufficient documentation

## 2016-08-27 HISTORY — DX: Presence of dental prosthetic device (complete) (partial): Z97.2

## 2016-08-27 HISTORY — PX: CATARACT EXTRACTION W/PHACO: SHX586

## 2016-08-27 HISTORY — DX: Family history of other specified conditions: Z84.89

## 2016-08-27 SURGERY — PHACOEMULSIFICATION, CATARACT, WITH IOL INSERTION
Anesthesia: Monitor Anesthesia Care | Site: Eye | Laterality: Left | Wound class: Clean

## 2016-08-27 MED ORDER — MOXIFLOXACIN HCL 0.5 % OP SOLN
1.0000 [drp] | OPHTHALMIC | Status: DC | PRN
  Administered 2016-08-27 (×3): 1 [drp] via OPHTHALMIC

## 2016-08-27 MED ORDER — MIDAZOLAM HCL 2 MG/2ML IJ SOLN
INTRAMUSCULAR | Status: DC | PRN
  Administered 2016-08-27: 2 mg via INTRAVENOUS

## 2016-08-27 MED ORDER — LIDOCAINE HCL (PF) 2 % IJ SOLN
INTRAOCULAR | Status: DC | PRN
  Administered 2016-08-27: 1 mL via INTRAOCULAR

## 2016-08-27 MED ORDER — ARMC OPHTHALMIC DILATING DROPS
1.0000 "application " | OPHTHALMIC | Status: DC | PRN
  Administered 2016-08-27 (×3): 1 via OPHTHALMIC

## 2016-08-27 MED ORDER — BRIMONIDINE TARTRATE-TIMOLOL 0.2-0.5 % OP SOLN
OPHTHALMIC | Status: DC | PRN
  Administered 2016-08-27: 1 [drp] via OPHTHALMIC

## 2016-08-27 MED ORDER — LACTATED RINGERS IV SOLN: INTRAVENOUS | Status: DC

## 2016-08-27 MED ORDER — EPINEPHRINE PF 1 MG/ML IJ SOLN
INTRAOCULAR | Status: DC | PRN
  Administered 2016-08-27: 68 mL via OPHTHALMIC

## 2016-08-27 MED ORDER — FENTANYL CITRATE (PF) 100 MCG/2ML IJ SOLN
INTRAMUSCULAR | Status: DC | PRN
  Administered 2016-08-27: 50 ug via INTRAVENOUS

## 2016-08-27 MED ORDER — NA HYALUR & NA CHOND-NA HYALUR 0.4-0.35 ML IO KIT
PACK | INTRAOCULAR | Status: DC | PRN
  Administered 2016-08-27: 1 mL via INTRAOCULAR

## 2016-08-27 MED ORDER — CEFUROXIME OPHTHALMIC INJECTION 1 MG/0.1 ML
INJECTION | OPHTHALMIC | Status: DC | PRN
  Administered 2016-08-27: .3 mL via OPHTHALMIC

## 2016-08-27 SURGICAL SUPPLY — 26 items
CANNULA ANT/CHMB 27GA (MISCELLANEOUS) ×3 IMPLANT
CARTRIDGE ABBOTT (MISCELLANEOUS) IMPLANT
GLOVE SURG LX 7.5 STRW (GLOVE) ×2
GLOVE SURG LX STRL 7.5 STRW (GLOVE) ×1 IMPLANT
GLOVE SURG TRIUMPH 8.0 PF LTX (GLOVE) ×3 IMPLANT
GOWN STRL REUS W/ TWL LRG LVL3 (GOWN DISPOSABLE) ×2 IMPLANT
GOWN STRL REUS W/TWL LRG LVL3 (GOWN DISPOSABLE) ×4
LENS IOL ACRSF IQ ULTRA 19.0 (Intraocular Lens) ×1 IMPLANT
LENS IOL ACRYSOF IQ 19.0 (Intraocular Lens) ×3 IMPLANT
MARKER SKIN DUAL TIP RULER LAB (MISCELLANEOUS) ×3 IMPLANT
NDL RETROBULBAR .5 NSTRL (NEEDLE) IMPLANT
NEEDLE FILTER BLUNT 18X 1/2SAF (NEEDLE) ×2
NEEDLE FILTER BLUNT 18X1 1/2 (NEEDLE) ×1 IMPLANT
PACK CATARACT BRASINGTON (MISCELLANEOUS) ×3 IMPLANT
PACK EYE AFTER SURG (MISCELLANEOUS) ×3 IMPLANT
PACK OPTHALMIC (MISCELLANEOUS) ×3 IMPLANT
RING MALYGIN 7.0 (MISCELLANEOUS) IMPLANT
SUT ETHILON 10-0 CS-B-6CS-B-6 (SUTURE)
SUT VICRYL  9 0 (SUTURE)
SUT VICRYL 9 0 (SUTURE) IMPLANT
SUTURE EHLN 10-0 CS-B-6CS-B-6 (SUTURE) IMPLANT
SYR 3ML LL SCALE MARK (SYRINGE) ×3 IMPLANT
SYR 5ML LL (SYRINGE) ×3 IMPLANT
SYR TB 1ML LUER SLIP (SYRINGE) ×3 IMPLANT
WATER STERILE IRR 250ML POUR (IV SOLUTION) ×3 IMPLANT
WIPE NON LINTING 3.25X3.25 (MISCELLANEOUS) ×3 IMPLANT

## 2016-08-27 NOTE — Transfer of Care (Signed)
Immediate Anesthesia Transfer of Care Note  Patient: Vanessa [name]  Procedure(s) Performed: Procedure(s): CATARACT EXTRACTION PHACO AND INTRAOCULAR LENS PLACEMENT (IOC)  Left (Left)  Patient Location: PACU  Anesthesia Type: MAC  Level of Consciousness: awake, alert  and patient cooperative  Airway and Oxygen Therapy: Patient Spontanous Breathing and Patient connected to supplemental oxygen  Post-op Assessment: Post-op Vital signs reviewed, Patient's Cardiovascular Status Stable, Respiratory Function Stable, Patent Airway and No signs of Nausea or vomiting  Post-op Vital Signs: Reviewed and stable  Complications: No apparent anesthesia complications

## 2016-08-27 NOTE — H&P (Signed)
The History and Physical notes are on paper, have been signed, and are to be scanned. The patient remains stable and unchanged from the H&P.   Previous H&P reviewed, patient examined, and there are no changes.  Vanessa [name] 08/27/2016 8:19 AM

## 2016-08-27 NOTE — Anesthesia Preprocedure Evaluation (Signed)
Anesthesia Evaluation  Patient identified by MRN, date of birth, ID band Patient awake    Reviewed: Allergy & Precautions, H&P , NPO status , Patient's Chart, lab work & pertinent test results  Airway Mallampati: II  TM Distance: >3 FB Neck ROM: full    Dental  (+) Upper Dentures, Lower Dentures   Pulmonary Current Smoker,    Pulmonary exam normal        Cardiovascular + Peripheral Vascular Disease  Normal cardiovascular exam     Neuro/Psych  Headaches,    GI/Hepatic   Endo/Other  Hypothyroidism   Renal/GU      Musculoskeletal   Abdominal   Peds  Hematology   Anesthesia Other Findings   Reproductive/Obstetrics                             Anesthesia Physical Anesthesia Plan  ASA: II  Anesthesia Plan: MAC   Post-op Pain Management:    Induction:   Airway Management Planned:   Additional Equipment:   Intra-op Plan:   Post-operative Plan:   Informed Consent: I have reviewed the patients History and Physical, chart, labs and discussed the procedure including the risks, benefits and alternatives for the proposed anesthesia with the patient or authorized representative who has indicated his/her understanding and acceptance.     Plan Discussed with:   Anesthesia Plan Comments:         Anesthesia Quick Evaluation

## 2016-08-27 NOTE — Op Note (Signed)
OPERATIVE NOTE  Vanessa [name] [id number] 08/27/2016   PREOPERATIVE DIAGNOSIS:  Nuclear sclerotic cataract left eye. H25.12   POSTOPERATIVE DIAGNOSIS:    Nuclear sclerotic cataract left eye.     PROCEDURE:  Phacoemusification with posterior chamber intraocular lens placement of the left eye   LENS:   Implant Name Type Inv. Item Serial No. Manufacturer Lot No. LRB No. Used  LENS IOL ACRYSOF IQ 19.0 - [id number] Intraocular Lens LENS IOL ACRYSOF IQ 19.0 [id number] ALCON   Left 1        ULTRASOUND TIME: 20  % of 1 minutes 46 seconds, CDE 21.5  SURGEON:  Deirdre Evenerhadwick R. Alyssah Algeo, MD   ANESTHESIA:  Topical with tetracaine drops and 2% Xylocaine jelly, augmented with 1% preservative-free intracameral lidocaine.    COMPLICATIONS:  None.   DESCRIPTION OF PROCEDURE:  The patient was identified in the holding room and transported to the operating room and placed in the supine position under the operating microscope.  The left eye was identified as the operative eye and it was prepped and draped in the usual sterile ophthalmic fashion.   A 1 millimeter clear-corneal paracentesis was made at the 1:30 position.  0.5 ml of preservative-free 1% lidocaine was injected into the anterior chamber.  The anterior chamber was filled with Viscoat viscoelastic.  A 2.4 millimeter keratome was used to make a near-clear corneal incision at the 10:30 position.  .  A curvilinear capsulorrhexis was made with a cystotome and capsulorrhexis forceps.  Balanced salt solution was used to hydrodissect and hydrodelineate the nucleus.   Phacoemulsification was then used in stop and chop fashion to remove the lens nucleus and epinucleus.  The remaining cortex was then removed using the irrigation and aspiration handpiece. Provisc was then placed into the capsular bag to distend it for lens placement.  A lens was then injected into the capsular bag.  The remaining viscoelastic was aspirated.   Wounds were hydrated with  balanced salt solution.  The anterior chamber was inflated to a physiologic pressure with balanced salt solution.  No wound leaks were noted. Cefuroxime 0.1 ml of a 10mg /ml solution was injected into the anterior chamber for a dose of 1 mg of intracameral antibiotic at the completion of the case.   Timolol and Brimonidine drops were applied to the eye.  The patient was taken to the recovery room in stable condition without complications of anesthesia or surgery.  Vanessa [name] 08/27/2016, 9:09 AM

## 2016-08-27 NOTE — Anesthesia Postprocedure Evaluation (Signed)
Anesthesia Post Note  Patient: Vanessa [name]  Procedure(s) Performed: Procedure(s) (LRB): CATARACT EXTRACTION PHACO AND INTRAOCULAR LENS PLACEMENT (IOC)  Left (Left)  Patient location during evaluation: PACU Anesthesia Type: MAC Level of consciousness: awake and alert and oriented Pain management: satisfactory to patient Vital Signs Assessment: post-procedure vital signs reviewed and stable Respiratory status: spontaneous breathing, nonlabored ventilation and respiratory function stable Cardiovascular status: blood pressure returned to baseline and stable Postop Assessment: Adequate PO intake and No signs of nausea or vomiting Anesthetic complications: no    Raliegh Ip

## 2016-08-28 ENCOUNTER — Encounter: Payer: Self-pay | Admitting: Ophthalmology

## 2016-10-15 ENCOUNTER — Telehealth: Payer: Self-pay

## 2016-10-15 NOTE — Telephone Encounter (Signed)
She has been on/likely will continue to be on tramadol for an extended period of time since this was the only med that prev helped her migraines.  No ADE on med.  No reason to cut her to 30 days since she'll still end up needing to come back for the other 60 days worth on the rx.

## 2016-10-15 NOTE — Telephone Encounter (Signed)
Renee advised.

## 2016-10-15 NOTE — Telephone Encounter (Signed)
Renee with [facility] request cb with reason why pt needs a 90 day rx instead of a 30 day rx for Tramadol. Luster LandsbergRenee understands pt wants a 90 day rx but with new guidelines has to have reason from physician why 90 days is given. Renee request cb.

## 2017-01-09 ENCOUNTER — Other Ambulatory Visit: Payer: Self-pay

## 2017-01-09 MED ORDER — TRAMADOL HCL 50 MG PO TABS: ORAL_TABLET | ORAL | 1 refills | Status: DC

## 2017-01-09 NOTE — Telephone Encounter (Signed)
Please call in.  Thanks.   

## 2017-01-09 NOTE — Telephone Encounter (Signed)
Pt left vm requesting refill tramadol to [facility] pharmacy;pt last seen and rx last refilled # [phone] x 1 on 07/17/16.

## 2017-01-09 NOTE — Telephone Encounter (Signed)
Rx called to pharmacy as instructed. 

## 2017-01-12 NOTE — Progress Notes (Signed)
Cardiology Office Note  Date:  01/14/2017   ID:  Vanessa, [name] 12-26-1934, MRN [id number]  PCP:  Joaquim Nam, MD   Chief Complaint  Patient presents with  . Follow-up    1 yr f/u; Dizziness on daily basis. Denies chest pain, shortness of breath, or swelling.    HPI:  Vanessa [name] is a very pleasant 82 year old woman with a hx of  atrial fibrillation prior to her colonoscopy,  h/o severe migraines at times,  hyperlipidemia  hypothyroidism.  Long history of smoking, continues to smoke 3 cigarettes a day  she lost her husband. Carotid ultrasound 2016 with less than 39% bilateral disease Chronic weight loss 4 children She presents for routine followup of her atrial fibrillation, PAD  In follow-up today she reports that she continues to be dizzy when standing Weight continues to trend downward, down an additional 4 pounds Reports that she eats sparingly during the daytime Tries to drink plenty of fluids, lots of diet Coke Denies having dizziness when supine Able to drive without dizziness Rarely has dizziness sitting, typically after she first stands up If she keeps walking symptoms resolve, able to walk her dog 4 times a day  Heart rate typically runs in the low 50s Chronic mild cough Lots of sweating over the summer, previously daughter reported she does not turn on her air conditioning  Currently not on a cholesterol medication. Cholesterol up to 200 Statin intolerance to the medications below Simvastatin: muscle ache Atorvastsatin: myalgia  EKG on today's visit shows normal sinus rhythm with rate 51 bpm, no significant ST or T-wave changes   PMH:   has a past medical history of AF (atrial fibrillation) (HCC); Anemia; Arthritis; Cataract; Family history of adverse reaction to anesthesia; Glaucoma; Glaucoma; Hyperglycemia; Hyperlipidemia; Hypothyroidism; Insomnia; Migraines; Osteopenia; and Wears dentures.  PSH:    Past Surgical History:  Procedure  Laterality Date  . CATARACT EXTRACTION    . CATARACT EXTRACTION W/PHACO Left 08/27/2016   Procedure: CATARACT EXTRACTION PHACO AND INTRAOCULAR LENS PLACEMENT (IOC)  Left;  Surgeon: Lockie Mola, MD;  Location: [facility];  Service: Ophthalmology;  Laterality: Left;  . COLONOSCOPY  2012  . REFRACTIVE SURGERY      Current Outpatient Prescriptions  Medication Sig Dispense Refill  . ALPRAZolam (XANAX) 0.5 MG tablet Take 1-2 tablets (0.5-1 mg total) by mouth at bedtime as needed. 180 tablet 1  . aspirin 81 MG EC tablet Take 81 mg by mouth daily.      Marland Kitchen aspirin-acetaminophen-caffeine (EXCEDRIN MIGRAINE) 250-250-65 MG per tablet Take 1 tablet by mouth every 6 (six) hours as needed.      . Calcium Carbonate-Vitamin D (CALCIUM 600 + D PO) Take 600 mg by mouth 2 (two) times daily.      . Cholecalciferol (VITAMIN D PO) Take by mouth daily.      . fish oil-omega-3 fatty acids 1000 MG capsule Take 1 g by mouth daily.      Marland Kitchen latanoprost (XALATAN) 0.005 % ophthalmic solution Place 1 drop into both eyes at bedtime.    Marland Kitchen levothyroxine (SYNTHROID, LEVOTHROID) 50 MCG tablet Take 1 tablet (50 mcg total) by mouth daily. 90 tablet 3  . Multiple Vitamin (MULTIVITAMIN) tablet Take 1 tablet by mouth daily.    . traMADol (ULTRAM) 50 MG tablet TAKE ONE AND ONE-HALF TABLETS THREE TIMES A DAY 405 tablet 1   No current facility-administered medications for this visit.      Allergies:   Codeine; Cortisone; Decongestant [pseudoephedrine  hcl]; Naproxen; and Simvastatin   Social History:  The patient  reports that she has been smoking Cigarettes.  She has a 12.50 pack-year smoking history. She has never used smokeless tobacco. She reports that she does not drink alcohol or use drugs.   Family History:   family history includes Diabetes in her father; Migraines in her mother; Stroke in her mother.    Review of Systems: Review of Systems  Constitutional: Positive for malaise/fatigue and weight loss.   Respiratory: Negative.   Cardiovascular: Negative.   Gastrointestinal: Negative.   Musculoskeletal: Negative.   Neurological: Positive for dizziness.  Psychiatric/Behavioral: Negative.   All other systems reviewed and are negative.    PHYSICAL EXAM: VS:  BP 122/62 (BP Location: Left Arm, Patient Position: Sitting, Cuff Size: Normal)   Pulse (!) 51   Ht  (1.575 m)   Wt 99 lb 4 oz (45 kg)   BMI 18.15 kg/m  , BMI Body mass index is 18.15 kg/m.  GEN: Thin,  in no acute distress HEENT: normal  Neck: no JVD, carotid bruits, or masses Cardiac: RRR; no murmurs, rubs, or gallops,no edema  Respiratory:  Mildly Decreased breath sounds throughout bilaterally, normal work of breathing GI: soft, nontender, nondistended, + BS MS: no deformity or atrophy  Skin: warm and dry, no rash Neuro:  Strength and sensation are intact Psych: euthymic mood, full affect    Recent Labs: 07/10/2016: ALT 11; BUN 15; Creatinine, Ser 0.83; Hemoglobin 14.6; Platelets 259.0; Potassium 4.1; Sodium 134; TSH 1.22    Lipid Panel Lab Results  Component Value Date   CHOL 202 (H) 07/10/2016   HDL 66.90 07/10/2016   LDLCALC 117 (H) 07/10/2016   TRIG 91.0 07/10/2016      Wt Readings from Last 3 Encounters:  01/14/17 99 lb 4 oz (45 kg)  08/27/16 102 lb (46.3 kg)  07/17/16 106 lb (48.1 kg)       ASSESSMENT AND PLAN:  Paroxysmal atrial fibrillation (HCC) Denies having any tachycardia palpitations Less likely contributing to her dizzy episodes though we have offered 30 day monitor if symptoms persist  Hyperlipidemia She is not interested in cholesterol medication at this time. Reports having myalgias on 2 statins.  carotid ultrasound showing minimal disease  Smoking Continues to smoke several cigarettes per day COPD though denies shortness of breath  Insomnia  Poor sleep hygiene, napping in the day   Other fatigue Fatigue likely secondary to poor sleep, unable to exclude  depression Significant weight loss over the past year or so  Dizzy Dizziness likely secondary to low blood pressure exacerbated by weight loss Recommended fluids, salt Suggested she monitor blood pressure when standing at home on bad days She has severe orthostasis confirmed with blood pressure numbers, she may benefit from medications such as Florinef  Orthostasis As above, she will monitor blood pressure numbers at home Encouraged her to continue fluid and salt intake   Total encounter time more than 25 minutes  Greater than 50% was spent in counseling and coordination of care with the patient   Disposition:   F/U  12 months    Signed, Dossie Arbour, M.D., Ph.D. 01/14/2017  [facility] [place], [place] [phone]

## 2017-01-14 ENCOUNTER — Other Ambulatory Visit: Payer: Self-pay | Admitting: *Deleted

## 2017-01-14 ENCOUNTER — Ambulatory Visit (INDEPENDENT_AMBULATORY_CARE_PROVIDER_SITE_OTHER): Payer: PPO | Admitting: Cardiovascular Disease

## 2017-01-14 VITALS — BP 122/62 | HR 51 | Ht 62.0 in | Wt 99.2 lb

## 2017-01-14 DIAGNOSIS — Z23 Encounter for immunization: Secondary | ICD-10-CM

## 2017-01-14 DIAGNOSIS — I480 Paroxysmal atrial fibrillation: Secondary | ICD-10-CM

## 2017-01-14 DIAGNOSIS — I6523 Occlusion and stenosis of bilateral carotid arteries: Secondary | ICD-10-CM

## 2017-01-14 DIAGNOSIS — R5382 Chronic fatigue, unspecified: Secondary | ICD-10-CM

## 2017-01-14 DIAGNOSIS — F17200 Nicotine dependence, unspecified, uncomplicated: Secondary | ICD-10-CM

## 2017-01-14 DIAGNOSIS — E782 Mixed hyperlipidemia: Secondary | ICD-10-CM

## 2017-01-14 NOTE — Telephone Encounter (Signed)
Electronic refill request.  Tramadol Last office visit:   07/17/16 Last Filled:     405 tablet 1 01/09/2017  Please advise.

## 2017-01-14 NOTE — Patient Instructions (Addendum)
Please monitor blood pressure and pulse when you are dizzy Check when you are standing  Medication Instructions:   No medication changes made  Labwork:  No new labs needed  Testing/Procedures:  No further testing at this time   Follow-Up: It was a pleasure seeing you in the office today. Please call us if you have new issues that need to be addressed before your next appt.  [phone]  Your physician wants you to follow-up in: 12 months.  You will receive a reminder letter in the mail two months in advance. If you don't receive a letter, please call our office to schedule the follow-up appointment.  If you need a refill on your cardiac medications before your next appointment, please call your pharmacy.

## 2017-01-16 NOTE — Telephone Encounter (Signed)
Per chart it was called in 9/19 by RLaws

## 2017-01-16 NOTE — Telephone Encounter (Signed)
Thanks

## 2017-01-16 NOTE — Telephone Encounter (Signed)
I thought this was recently addressed.

## 2017-02-26 DIAGNOSIS — H401131 Primary open-angle glaucoma, bilateral, mild stage: Secondary | ICD-10-CM | POA: Diagnosis not present

## 2017-03-29 DIAGNOSIS — H401131 Primary open-angle glaucoma, bilateral, mild stage: Secondary | ICD-10-CM | POA: Diagnosis not present

## 2017-04-10 ENCOUNTER — Other Ambulatory Visit: Payer: Self-pay | Admitting: *Deleted

## 2017-04-10 NOTE — Telephone Encounter (Signed)
Faxed refill request Last refill 07/17/16 #180/1 refill Last office visit 07/17/16

## 2017-04-11 MED ORDER — ALPRAZOLAM 0.5 MG PO TABS: 0.5000 mg | ORAL_TABLET | Freq: Every evening | ORAL | 1 refills | Status: DC | PRN

## 2017-04-11 NOTE — Telephone Encounter (Signed)
Medication phoned to pharmacy.  

## 2017-04-11 NOTE — Telephone Encounter (Signed)
Please call in.  Thanks.   

## 2017-07-02 ENCOUNTER — Other Ambulatory Visit: Payer: Self-pay | Admitting: *Deleted

## 2017-07-02 NOTE — Telephone Encounter (Signed)
Received fax for refills on 2 medications.  Last office visit:   07/17/2016 Last Filled:   Tramadol 405 tablet 1 01/09/2017  Last Filled:   Levothyroxine 90 tablet 3 07/17/2016  Physical exam scheduled 07/19/17.  Please advise.

## 2017-07-03 MED ORDER — LEVOTHYROXINE SODIUM 50 MCG PO TABS: 50.0000 ug | ORAL_TABLET | Freq: Every day | ORAL | 3 refills | Status: DC

## 2017-07-03 MED ORDER — TRAMADOL HCL 50 MG PO TABS: ORAL_TABLET | ORAL | 1 refills | Status: DC

## 2017-07-03 NOTE — Telephone Encounter (Signed)
Sent. Thanks.   

## 2017-07-19 ENCOUNTER — Encounter: Payer: Self-pay | Admitting: Family Medicine

## 2017-07-19 ENCOUNTER — Ambulatory Visit (INDEPENDENT_AMBULATORY_CARE_PROVIDER_SITE_OTHER): Payer: PPO | Admitting: Family Medicine

## 2017-07-19 VITALS — BP 142/88 | HR 76 | Temp 98.4°F | Ht 62.0 in | Wt 97.5 lb

## 2017-07-19 DIAGNOSIS — F17200 Nicotine dependence, unspecified, uncomplicated: Secondary | ICD-10-CM

## 2017-07-19 DIAGNOSIS — M8580 Other specified disorders of bone density and structure, unspecified site: Secondary | ICD-10-CM | POA: Diagnosis not present

## 2017-07-19 DIAGNOSIS — G43009 Migraine without aura, not intractable, without status migrainosus: Secondary | ICD-10-CM | POA: Diagnosis not present

## 2017-07-19 DIAGNOSIS — G4700 Insomnia, unspecified: Secondary | ICD-10-CM | POA: Diagnosis not present

## 2017-07-19 DIAGNOSIS — F4321 Adjustment disorder with depressed mood: Secondary | ICD-10-CM | POA: Diagnosis not present

## 2017-07-19 DIAGNOSIS — Z0000 Encounter for general adult medical examination without abnormal findings: Secondary | ICD-10-CM | POA: Diagnosis not present

## 2017-07-19 DIAGNOSIS — E785 Hyperlipidemia, unspecified: Secondary | ICD-10-CM | POA: Diagnosis not present

## 2017-07-19 DIAGNOSIS — Z7189 Other specified counseling: Secondary | ICD-10-CM

## 2017-07-19 DIAGNOSIS — R739 Hyperglycemia, unspecified: Secondary | ICD-10-CM

## 2017-07-19 DIAGNOSIS — E039 Hypothyroidism, unspecified: Secondary | ICD-10-CM | POA: Diagnosis not present

## 2017-07-19 DIAGNOSIS — I959 Hypotension, unspecified: Secondary | ICD-10-CM

## 2017-07-19 LAB — TSH: TSH: 0.82 u[IU]/mL (ref 0.35–4.50)

## 2017-07-19 LAB — CBC WITH DIFFERENTIAL/PLATELET
BASOS ABS: 0 10*3/uL (ref 0.0–0.1)
Basophils Relative: 0.5 % (ref 0.0–3.0)
EOS ABS: 0.1 10*3/uL (ref 0.0–0.7)
Eosinophils Relative: 1.9 % (ref 0.0–5.0)
HEMATOCRIT: 43.8 % (ref 36.0–46.0)
Hemoglobin: 14.8 g/dL (ref 12.0–15.0)
Lymphocytes Relative: 19.4 % (ref 12.0–46.0)
Lymphs Abs: 1.2 10*3/uL (ref 0.7–4.0)
MCHC: 33.8 g/dL (ref 30.0–36.0)
MCV: 93.4 fl (ref 78.0–100.0)
Monocytes Absolute: 0.4 10*3/uL (ref 0.1–1.0)
Monocytes Relative: 7.1 % (ref 3.0–12.0)
NEUTROS ABS: 4.5 10*3/uL (ref 1.4–7.7)
Neutrophils Relative %: 71.1 % (ref 43.0–77.0)
PLATELETS: 260 10*3/uL (ref 150.0–400.0)
RBC: 4.69 Mil/uL (ref 3.87–5.11)
RDW: 13.3 % (ref 11.5–15.5)
WBC: 6.3 10*3/uL (ref 4.0–10.5)

## 2017-07-19 LAB — COMPREHENSIVE METABOLIC PANEL
ALBUMIN: 4 g/dL (ref 3.5–5.2)
ALT: 11 U/L (ref 0–35)
AST: 24 U/L (ref 0–37)
Alkaline Phosphatase: 39 U/L (ref 39–117)
BUN: 9 mg/dL (ref 6–23)
CALCIUM: 9.6 mg/dL (ref 8.4–10.5)
CHLORIDE: 101 meq/L (ref 96–112)
CO2: 30 meq/L (ref 19–32)
CREATININE: 0.75 mg/dL (ref 0.40–1.20)
GFR: 78.87 mL/min (ref >60.00)
Glucose, Bld: 108 mg/dL — ABNORMAL HIGH (ref 70–99)
Potassium: 4.1 mEq/L (ref 3.5–5.1)
Sodium: 138 mEq/L (ref 135–145)
Total Bilirubin: 0.5 mg/dL (ref 0.2–1.2)
Total Protein: 7 g/dL (ref 6.0–8.3)

## 2017-07-19 LAB — VITAMIN D 25 HYDROXY (VIT D DEFICIENCY, FRACTURES): VITD: 32.72 ng/mL (ref 30.00–100.00)

## 2017-07-19 LAB — LIPID PANEL
CHOLESTEROL: 197 mg/dL (ref 0–200)
HDL: 61.1 mg/dL (ref >39.00)
LDL Cholesterol: 122 mg/dL — ABNORMAL HIGH (ref 0–99)
NonHDL: 136.3
Total CHOL/HDL Ratio: 3
Triglycerides: 73 mg/dL (ref 0.0–149.0)
VLDL: 14.6 mg/dL (ref 0.0–40.0)

## 2017-07-19 LAB — HEMOGLOBIN A1C: HEMOGLOBIN A1C: 5.9 % (ref 4.6–6.5)

## 2017-07-19 MED ORDER — TRAZODONE HCL 50 MG PO TABS: 25.0000 mg | ORAL_TABLET | Freq: Every evening | ORAL | 3 refills | Status: DC | PRN

## 2017-07-19 NOTE — Patient Instructions (Signed)
Go to the lab on the way out.  We'll contact you with your lab report.  Change from the xanax to trazodone.  Start 1/2 tab at night.  You can gradually increase to 1 then 2 tabs at night if needed.  Let me know how that goes.  Try adding in some extra protein with a snack each day (nuts, cheese, etc).  Update me as needed. Take care.  Glad to see you.  If you have more weight loss then let me know.

## 2017-07-19 NOTE — Progress Notes (Signed)
I have personally reviewed the Medicare Annual Wellness questionnaire and have noted 1. The patient's medical and social history 2. Their use of alcohol, tobacco or illicit drugs 3. Their current medications and supplements 4. The patient's functional ability including ADL's, fall risks, home safety risks and hearing or visual             impairment. 5. Diet and physical activities 6. Evidence for depression or mood disorders  The patients weight, height, BMI have been recorded in the chart and visual acuity is per eye clinic.  I have made referrals, counseling and provided education to the patient based review of the above and I have provided the pt with a written personalized care plan for preventive services.  Provider list updated- see scanned forms.  Routine anticipatory guidance given to patient.  See health maintenance. The possibility exists that previously documented standard health maintenance information may have been brought forward from a previous encounter into this note.  If needed, that same information has been updated to reflect the current situation based on today's encounter.    Flu 2018 Shingles 2010 PNA UTD Tetanus 2013 Colonoscopy declined due to age Breast cancer screening d/w pt. She wouldn't want intervention if abnormal. DXA declined by patient.  D/w pt.  She wouldn't want intervention if abnormal  Advance directive- daughter Vanessa [name] designated if patient were incapacitated.  Cognitive function addressed- see scanned forms- and if abnormal then additional documentation follows.  She is smoking with coffee in the AM.  D/w pt.  She didn't want to fully give up smoking.   Sugar elevation noted, d/w pt about diet and exercise.   Migraine.  She didn't want to quit smoking.  Preemptive treatment with tramadol was the only thing that really helped.  No ADE.  D/w pt.    Insomnia.  Taking xanax at night.  No ADE on med.  She wanted to continue with some form of  treatment for insomnia.  Usually taking 1 tab at night.  Sleeping well with that.  She isn't groggy the next AM.  D/w pt about options and fall risk.    Lightheaded.  She does not have vertigo.  No room spinning.  Episodic.  Long-standing per patient report.  Irregular onset.  No syncope.  Sitting down helps some.  No chest pain.  Discussed with patient about making sure she was drinking plenty of fluid.  Mood.  Her PHQ score is low but she does have significant symptoms of grief related to the death of family members.  She is trying to get by.  She has no suicidal or homicidal intent.  She got tearful talking about the loss of family members.  If her moods worsens then she agreed she would update me.  She has a decrease in appetite that is likely mood related and she thought this was affecting her weight some.  She has a fullystocked refrigerator at home with access to groceries but she just did not have as much intake related to decreased appetite.  We talked about options to increase her calories throughout the day.  She agrees to monitor her weight and update me if she continues to lose any weight.  We talked about grief counseling etc.  Hypothyroidism.  No neck mass, no lumps.  No trouble swallowing.  Compliant.  Due for labs.    PMH and SH reviewed  Meds, vitals, and allergies reviewed.   ROS: Per HPI.  Unless specifically indicated otherwise in HPI, the  patient denies:  General: fever. Eyes: acute vision changes ENT: sore throat Cardiovascular: chest pain Respiratory: SOB GI: vomiting GU: dysuria Musculoskeletal: acute back pain Derm: acute rash Neuro: acute motor dysfunction Psych: worsening mood Endocrine: polydipsia Heme: bleeding Allergy: hayfever  GEN: nad, alert and oriented HEENT: mucous membranes moist NECK: supple w/o LA CV: rrr. PULM: ctab, no inc wob ABD: soft, +bs EXT: no edema SKIN: no acute rash

## 2017-07-21 NOTE — Assessment & Plan Note (Signed)
Likely relative hypotension causing lightheaded symptoms.  Discussed with patient about making sure she has enough fluid intake.

## 2017-07-21 NOTE — Assessment & Plan Note (Signed)
She has no suicidal or homicidal intent.  She got tearful talking about the loss of family members.  If her moods worsens then she agreed she would update me.  She has a decrease in appetite that is likely mood related and she thought this was affecting her weight some.  She has a fullystocked refrigerator at home with access to groceries but she just did not have as much intake related to decreased appetite.  We talked about options to increase her calorie intake throughout the day.  She agrees to monitor her weight and update me if she continues to lose any weight.

## 2017-07-21 NOTE — Assessment & Plan Note (Signed)
She has not yet ready to fully quit.  Discussed with patient.

## 2017-07-21 NOTE — Assessment & Plan Note (Signed)
Flu 2018 Shingles 2010 PNA UTD Tetanus 2013 Colonoscopy declined due to age Breast cancer screening d/w pt. She wouldn't want intervention if abnormal. DXA declined by patient.  D/w pt.  She wouldn't want intervention if abnormal  Advance directive- daughter Cleophas DunkerKarla [name] designated if patient were incapacitated.  Cognitive function addressed- see scanned forms- and if abnormal then additional documentation follows.  She is smoking with coffee in the AM.  D/w pt.  She didn't want to fully give up smoking.   Sugar elevation noted, d/w pt about diet and exercise.

## 2017-07-21 NOTE — Assessment & Plan Note (Signed)
Change from Xanax over to trazodone.  Routine cautions given.  We talked about benzodiazepine cautions in general.  She will update me as needed.  See after visit summary.

## 2017-07-21 NOTE — Assessment & Plan Note (Signed)
Due for recheck labs.  See notes on labs.   

## 2017-07-21 NOTE — Assessment & Plan Note (Signed)
Advance directive- daughter Cleophas DunkerKarla [name] designated if patient were incapacitated.

## 2017-07-21 NOTE — Assessment & Plan Note (Signed)
I expect her migraines to continue as long as she is smoking.  Discussed.  She is not yet ready to quit smoking.  She is used tramadol without adverse effect.  Reasonable to continue for now.

## 2017-08-13 ENCOUNTER — Telehealth: Payer: Self-pay | Admitting: *Deleted

## 2017-08-13 NOTE — Telephone Encounter (Signed)
Copied from CRM [id number]. Topic: General - Other >> Aug 13, 2017 12:26 PM Percival SpanishKennedy, Cheryl W wrote:  Tresa EndoKelly pt daughter called to say pt is mixing  traZODone (DESYREL) 50 MG tablet   with Prudy FeelerXANAX and is doing a lot of sleeping.   Tresa EndoKelly said she thinks pt is having side affect  Pt does not have DPR so I did Not discuss with daughter

## 2017-08-13 NOTE — Telephone Encounter (Signed)
Left detailed message on voicemail to schedule 30 minute OV per Dr. Para Marchuncan.  Also left a message on VM of daughter that we had phoned her to schedule an appointment but that no mention was made of speaking with the daughter.

## 2017-08-13 NOTE — Telephone Encounter (Signed)
Scheduled patient for 30 min OV on Friday, August 16, 2017.

## 2017-08-13 NOTE — Telephone Encounter (Signed)
Needs OV.  Don't disclose about the call from the daughter if at all possible.  Okay to consider this f/u re: insomnia with prev change from rx xanax to trazodone.  OV.  Thanks.

## 2017-08-16 ENCOUNTER — Ambulatory Visit (INDEPENDENT_AMBULATORY_CARE_PROVIDER_SITE_OTHER): Payer: PPO | Admitting: Family Medicine

## 2017-08-16 ENCOUNTER — Encounter: Payer: Self-pay | Admitting: Family Medicine

## 2017-08-16 VITALS — BP 108/58 | HR 57 | Temp 98.2°F | Ht 62.0 in | Wt 92.2 lb

## 2017-08-16 DIAGNOSIS — R634 Abnormal weight loss: Secondary | ICD-10-CM

## 2017-08-16 LAB — COMPREHENSIVE METABOLIC PANEL
ALK PHOS: 55 U/L (ref 39–117)
ALT: 10 U/L (ref 0–35)
AST: 18 U/L (ref 0–37)
Albumin: 3.2 g/dL — ABNORMAL LOW (ref 3.5–5.2)
BILIRUBIN TOTAL: 0.4 mg/dL (ref 0.2–1.2)
BUN: 13 mg/dL (ref 6–23)
CO2: 32 mEq/L (ref 19–32)
CREATININE: 0.77 mg/dL (ref 0.40–1.20)
Calcium: 8.9 mg/dL (ref 8.4–10.5)
Chloride: 98 mEq/L (ref 96–112)
GFR: 76.49 mL/min (ref >60.00)
GLUCOSE: 88 mg/dL (ref 70–99)
Potassium: 3.7 mEq/L (ref 3.5–5.1)
SODIUM: 140 meq/L (ref 135–145)
Total Protein: 6.6 g/dL (ref 6.0–8.3)

## 2017-08-16 LAB — CBC WITH DIFFERENTIAL/PLATELET
Basophils Absolute: 0.1 10*3/uL (ref 0.0–0.1)
Basophils Relative: 0.9 % (ref 0.0–3.0)
EOS ABS: 0.1 10*3/uL (ref 0.0–0.7)
Eosinophils Relative: 0.7 % (ref 0.0–5.0)
HCT: 37.7 % (ref 36.0–46.0)
Hemoglobin: 12.6 g/dL (ref 12.0–15.0)
LYMPHS ABS: 1 10*3/uL (ref 0.7–4.0)
Lymphocytes Relative: 10.2 % — ABNORMAL LOW (ref 12.0–46.0)
MCHC: 33.5 g/dL (ref 30.0–36.0)
MCV: 90.9 fl (ref 78.0–100.0)
MONO ABS: 0.6 10*3/uL (ref 0.1–1.0)
Monocytes Relative: 6.2 % (ref 3.0–12.0)
NEUTROS PCT: 82 % — AB (ref 43.0–77.0)
Neutro Abs: 8 10*3/uL — ABNORMAL HIGH (ref 1.4–7.7)
Platelets: 528 10*3/uL — ABNORMAL HIGH (ref 150.0–400.0)
RBC: 4.15 Mil/uL (ref 3.87–5.11)
RDW: 13.2 % (ref 11.5–15.5)
WBC: 9.8 10*3/uL (ref 4.0–10.5)

## 2017-08-16 LAB — TSH: TSH: 1.05 u[IU]/mL (ref 0.35–4.50)

## 2017-08-16 NOTE — Progress Notes (Addendum)
Here with daughter to supplement hx.  Weight loss noted.  She is occ lightheaded.  She has been sleeping more during the day and not taking xanax or trazodone per patient report.  Unclear if she took both meds together concurrently prev. But off both meds now.  She is sleeping during the day and that is a change.  She reports sleeping well at night.  Family has noted some short-term memory changes recently with episodic increase in confusion and difficulty with concentration.  She seems less interested in routine activities, which could be related to fatigue.  Mood d/w pt. she previously admitted to being sad about the death of other family members but she does not have any suicidal or homicidal ideation.  She is a smoker.  She has some cough but no wheeze.  She occasionally has some clear sputum.  She does not have B symptoms otherwise.  No fevers.  No chills.  No hemoptysis.  No dysuria.  No blood in stool.  No abdominal pain.  PMH and SH reviewed  ROS: Per HPI unless specifically indicated in ROS section   Meds, vitals, and allergies reviewed.   GEN: nad, alert and oriented, elderly female. HEENT: mucous membranes moist NECK: supple w/o LA CV: rrr.  PULM: ctab, no inc wob ABD: soft, +bs EXT: no edema SKIN: no acute rash Normal brief memory testing.  Normal recall.  Normal attention.

## 2017-08-16 NOTE — Patient Instructions (Addendum)
Stay off trazodone and xanax for now.  Check your med list at home.  Update me if any changes.  Go to the lab on the way out.  We'll contact you with your lab report. Add on snacks with extra protein.  Drink enough water to keep your urine clear.   We'll make plans when I see your labs.  Take care.  Glad to see you.

## 2017-08-19 ENCOUNTER — Other Ambulatory Visit: Payer: Self-pay | Admitting: Family Medicine

## 2017-08-19 DIAGNOSIS — R634 Abnormal weight loss: Secondary | ICD-10-CM

## 2017-08-19 DIAGNOSIS — R05 Cough: Secondary | ICD-10-CM

## 2017-08-19 DIAGNOSIS — R059 Cough, unspecified: Secondary | ICD-10-CM

## 2017-08-19 NOTE — Assessment & Plan Note (Signed)
Stay off trazodone and xanax for now.  She/family will check med list at home and update me if any changes.  Check labs today Add on snacks with extra protein.  Drink enough water to keep urine clear.   Differential diagnosis discussed with patient.  She could have an issue with depression causing decrease in appetite with relative deconditioning and loss of muscle mass.  She could have an occult cancer.  She is a smoker.  We talked about the broad differential.  She has no B symptoms at this point.  If her labs are unremarkable then we will likely image her chest.  It may be that she benefits from a medication like Remeron.

## 2017-08-26 ENCOUNTER — Ambulatory Visit
Admission: RE | Admit: 2017-08-26 | Discharge: 2017-08-26 | Disposition: A | Discharge status: Home / Self Care | Payer: PPO | Source: Ambulatory Visit | Attending: Family Medicine | Admitting: Family Medicine

## 2017-08-26 DIAGNOSIS — R911 Solitary pulmonary nodule: Secondary | ICD-10-CM | POA: Insufficient documentation

## 2017-08-26 DIAGNOSIS — R059 Cough, unspecified: Secondary | ICD-10-CM

## 2017-08-26 DIAGNOSIS — I700 Atherosclerosis of aorta: Secondary | ICD-10-CM | POA: Diagnosis not present

## 2017-08-26 DIAGNOSIS — R918 Other nonspecific abnormal finding of lung field: Secondary | ICD-10-CM | POA: Diagnosis not present

## 2017-08-26 DIAGNOSIS — K449 Diaphragmatic hernia without obstruction or gangrene: Secondary | ICD-10-CM | POA: Diagnosis not present

## 2017-08-26 DIAGNOSIS — R634 Abnormal weight loss: Secondary | ICD-10-CM | POA: Diagnosis not present

## 2017-08-26 DIAGNOSIS — R05 Cough: Secondary | ICD-10-CM

## 2017-08-26 DIAGNOSIS — K769 Liver disease, unspecified: Secondary | ICD-10-CM | POA: Insufficient documentation

## 2017-08-26 DIAGNOSIS — J439 Emphysema, unspecified: Secondary | ICD-10-CM | POA: Diagnosis not present

## 2017-08-26 MED ORDER — IOPAMIDOL (ISOVUE-300) INJECTION 61%
75.0000 mL | Freq: Once | INTRAVENOUS | Status: AC | PRN
  Administered 2017-08-26: 75 mL via INTRAVENOUS

## 2017-08-30 ENCOUNTER — Telehealth: Payer: Self-pay | Admitting: Family Medicine

## 2017-08-30 NOTE — Telephone Encounter (Signed)
Copied from CRM [id number]. Topic: Quick Communication - See Telephone Encounter >> Aug 30, 2017  8:46 AM Louie Bun, Rosey Bath D wrote: CRM for notification. See Telephone encounter for: 08/30/17. Patient daughter would like a call back from Dr. Para March CMA about her mothers results. Please call her back , thanks.

## 2017-08-30 NOTE — Telephone Encounter (Signed)
Tell her I want to consider options and we'll be in touch.  Thanks.

## 2017-08-30 NOTE — Telephone Encounter (Signed)
Patient's daughter [name] notified as instructed by telephone and verbalized understanding.

## 2017-08-30 NOTE — Telephone Encounter (Signed)
Patient's daughter [name] ( on [place]) given results per Dr. Para March and verbalized understanding. [name] stated that she is still concerned about her mom's dizziness and she is not able to drive because of it. [name] stated that the dizziness comes and goes and some days it is so bad that her mom will not go out and get the newspaper. [name] stated that her mom does not have an appetite. [name] stated that her mom use to eat in front of them just to satisfy them, but now when they take food to her she just puts it in the refrigerator and tells them she will eat it later.  [name] wants to know if other test need to be done to fine out about the dizziness?

## 2017-09-01 NOTE — Telephone Encounter (Signed)
I have been considering this.  I think she is going to need on OV to talk about dizziness specifically. There is some testing we can do at the office and then she may end up needing imaging thereafter.  We'll also need to talk about her appetite at the OV.  if at all possible. Thanks.

## 2017-09-02 NOTE — Telephone Encounter (Signed)
Left message on voicemail for patient's daughter [name] (on [place])  to call back.

## 2017-09-03 NOTE — Telephone Encounter (Signed)
Spoke with Kell, pt.'s daughter, and gave her Dr. Lianne Bushy recommendation. [name] states "the only way to get my Mom in for an office visit is to have [facility] call her and give her the appointment." I told her I would forward this on.

## 2017-09-03 NOTE — Telephone Encounter (Signed)
Spoke to  patient's daughter [name]) and advised her that we will call her mom and get appointment scheduled. [name] stated that she will check mychart and see when the appointment is scheduled and will make sure her mom gets to the appointment.  Called and spoke to patient and appointment scheduled for 09/09/17.

## 2017-09-09 ENCOUNTER — Emergency Department: Payer: PPO

## 2017-09-09 ENCOUNTER — Other Ambulatory Visit: Payer: Self-pay

## 2017-09-09 ENCOUNTER — Ambulatory Visit (INDEPENDENT_AMBULATORY_CARE_PROVIDER_SITE_OTHER): Payer: PPO | Admitting: Family Medicine

## 2017-09-09 ENCOUNTER — Emergency Department
Admission: EM | Admit: 2017-09-09 | Discharge: 2017-09-09 | Disposition: A | Discharge status: Home / Self Care | Payer: PPO | Attending: Student in an Organized Health Care Education/Training Program | Admitting: Student in an Organized Health Care Education/Training Program

## 2017-09-09 ENCOUNTER — Encounter: Payer: Self-pay | Admitting: Family Medicine

## 2017-09-09 ENCOUNTER — Encounter: Payer: Self-pay | Admitting: Emergency Medicine

## 2017-09-09 VITALS — BP 102/70 | HR 92 | Temp 98.4°F | Ht 62.0 in | Wt 90.2 lb

## 2017-09-09 DIAGNOSIS — E039 Hypothyroidism, unspecified: Secondary | ICD-10-CM | POA: Insufficient documentation

## 2017-09-09 DIAGNOSIS — F17210 Nicotine dependence, cigarettes, uncomplicated: Secondary | ICD-10-CM | POA: Insufficient documentation

## 2017-09-09 DIAGNOSIS — Z7982 Long term (current) use of aspirin: Secondary | ICD-10-CM | POA: Insufficient documentation

## 2017-09-09 DIAGNOSIS — J449 Chronic obstructive pulmonary disease, unspecified: Secondary | ICD-10-CM | POA: Diagnosis not present

## 2017-09-09 DIAGNOSIS — F4320 Adjustment disorder, unspecified: Secondary | ICD-10-CM | POA: Diagnosis not present

## 2017-09-09 DIAGNOSIS — I480 Paroxysmal atrial fibrillation: Secondary | ICD-10-CM | POA: Diagnosis not present

## 2017-09-09 DIAGNOSIS — I4891 Unspecified atrial fibrillation: Secondary | ICD-10-CM | POA: Diagnosis not present

## 2017-09-09 DIAGNOSIS — R634 Abnormal weight loss: Secondary | ICD-10-CM | POA: Diagnosis present

## 2017-09-09 DIAGNOSIS — Z79899 Other long term (current) drug therapy: Secondary | ICD-10-CM | POA: Insufficient documentation

## 2017-09-09 LAB — COMPREHENSIVE METABOLIC PANEL
ALBUMIN: 4 g/dL (ref 3.5–5.0)
ALK PHOS: 52 U/L (ref 38–126)
ALT: 13 U/L — AB (ref 14–54)
ANION GAP: 10 (ref 5–15)
AST: 28 U/L (ref 15–41)
BILIRUBIN TOTAL: 1.1 mg/dL (ref 0.3–1.2)
BUN: 12 mg/dL (ref 6–20)
CALCIUM: 9.4 mg/dL (ref 8.9–10.3)
CO2: 25 mmol/L (ref 22–32)
CREATININE: 0.84 mg/dL (ref 0.44–1.00)
Chloride: 102 mmol/L (ref 101–111)
GFR calc non Af Amer: >60 mL/min (ref >60)
GLUCOSE: 108 mg/dL — AB (ref 65–99)
Potassium: 4.1 mmol/L (ref 3.5–5.1)
SODIUM: 137 mmol/L (ref 135–145)
TOTAL PROTEIN: 7.1 g/dL (ref 6.5–8.1)

## 2017-09-09 LAB — CBC WITH DIFFERENTIAL/PLATELET
Basophils Absolute: 0.1 10*3/uL (ref 0–0.1)
Basophils Relative: 1 %
Eosinophils Absolute: 0.1 10*3/uL (ref 0–0.7)
Eosinophils Relative: 1 %
HCT: 43.5 % (ref 35.0–47.0)
HEMOGLOBIN: 14.6 g/dL (ref 12.0–16.0)
LYMPHS ABS: 1.4 10*3/uL (ref 1.0–3.6)
LYMPHS PCT: 18 %
MCH: 30.9 pg (ref 26.0–34.0)
MCHC: 33.7 g/dL (ref 32.0–36.0)
MCV: 91.9 fL (ref 80.0–100.0)
MONOS PCT: 9 %
Monocytes Absolute: 0.7 10*3/uL (ref 0.2–0.9)
NEUTROS PCT: 71 %
Neutro Abs: 5.6 10*3/uL (ref 1.4–6.5)
Platelets: 234 10*3/uL (ref 150–440)
RBC: 4.73 MIL/uL (ref 3.80–5.20)
RDW: 14.5 % (ref 11.5–14.5)
WBC: 7.8 10*3/uL (ref 3.6–11.0)

## 2017-09-09 LAB — TROPONIN I: Troponin I: <0.03 ng/mL (ref <0.03)

## 2017-09-09 LAB — TSH: TSH: 1.82 u[IU]/mL (ref 0.350–4.500)

## 2017-09-09 LAB — MAGNESIUM: Magnesium: 2.1 mg/dL (ref 1.7–2.4)

## 2017-09-09 MED ORDER — DILTIAZEM HCL ER COATED BEADS 120 MG PO CP24
120.0000 mg | ORAL_CAPSULE | Freq: Once | ORAL | Status: AC
  Administered 2017-09-09: 120 mg via ORAL
  Filled 2017-09-09: qty 1

## 2017-09-09 MED ORDER — DILTIAZEM HCL ER COATED BEADS 120 MG PO CP24: 120.0000 mg | ORAL_CAPSULE | Freq: Every day | ORAL | 0 refills | Status: DC

## 2017-09-09 MED ORDER — SODIUM CHLORIDE 0.9 % IV BOLUS
500.0000 mL | Freq: Once | INTRAVENOUS | Status: AC
  Administered 2017-09-09: 500 mL via INTRAVENOUS

## 2017-09-09 MED ORDER — DILTIAZEM HCL 60 MG PO TABS
60.0000 mg | ORAL_TABLET | Freq: Once | ORAL | Status: AC
  Administered 2017-09-09: 60 mg via ORAL
  Filled 2017-09-09 (×2): qty 1

## 2017-09-09 NOTE — Patient Instructions (Signed)
Go to the ER at [facility].  We'll all ahead.   Take care.  Glad to see you.

## 2017-09-09 NOTE — ED Provider Notes (Signed)
[facility] Emergency Department Provider Note    First MD Initiated Contact with Patient 09/09/17 1521     (approximate)  I have reviewed the triage vital signs and the nursing notes.   HISTORY  Chief Complaint Abnormal ECG    HPI MONIK LINS is a 83 y.o. female with a history of A. fib during colonoscopy procedure presents to the ER with an abnormal EKG.  Had routine follow-up with her PCP because she has had several pound weight loss over the past several months.  Was noted to be in A. fib with RVR.  Denies any palpitations or chest pain.  Does take a full dose aspirin.  No weakness.  States that she has been drinking plenty of fluids but has not been eating as much as she used to.  States that she simply does not have any hunger.  States she otherwise feels well and has no complaints.  Past Medical History:  Diagnosis Date  . AF (atrial fibrillation) (HCC)    during colonoscopy  . Anemia    treated ~2009 with iron, resolved  . Arthritis   . Cataract    surgery on R catarct 2012  . Family history of adverse reaction to anesthesia    Mother - altered mental status (long term)  . Glaucoma   . Glaucoma   . Hyperglycemia    mild inc in fasting sugar  . Hyperlipidemia   . Hypothyroidism   . Insomnia   . Migraines    without aura, sx since age 64  . Osteopenia    prev with 10 years of fosamax and intolerant of evista  . Wears dentures    full upper and lower   Family History  Problem Relation Age of Onset  . Diabetes Father   . Migraines Mother   . Stroke Mother        TIAs  . Breast cancer Neg Hx   . Colon cancer Neg Hx    Past Surgical History:  Procedure Laterality Date  . CATARACT EXTRACTION    . CATARACT EXTRACTION W/PHACO Left 08/27/2016   Procedure: CATARACT EXTRACTION PHACO AND INTRAOCULAR LENS PLACEMENT (IOC)  Left;  Surgeon: Lockie Mola, MD;  Location: [facility];  Service: Ophthalmology;  Laterality: Left;   . COLONOSCOPY  2012  . REFRACTIVE SURGERY     Patient Active Problem List   Diagnosis Date Noted  . Abnormal weight loss 08/19/2017  . Hypotension 01/18/2016  . Fatigue 07/12/2015  . Hyperglycemia 05/25/2014  . OA (osteoarthritis) 05/25/2014  . Adjustment disorder 01/07/2014  . Environmental allergies 12/13/2013  . Arthritis 06/27/2013  . Carotid stenosis 11/04/2012  . Migraine 11/12/2011  . Medicare annual wellness visit, subsequent 05/15/2011  . Osteopenia 01/25/2011  . Insomnia 01/25/2011  . Hypothyroid 01/25/2011  . Advance directive discussed with patient 01/24/2011  . Paroxysmal atrial fibrillation (HCC) 08/17/2010  . Sick sinus syndrome (HCC) 08/17/2010  . Smoking 08/17/2010  . Hyperlipidemia 08/17/2010      Prior to Admission medications   Medication Sig Start Date End Date Taking? Authorizing Provider  aspirin 81 MG EC tablet Take 81 mg by mouth daily.      [provider]  aspirin-acetaminophen-caffeine (EXCEDRIN MIGRAINE) [phone] MG per tablet Take 1 tablet by mouth every 6 (six) hours as needed.      [provider]  fish oil-omega-3 fatty acids 1000 MG capsule Take 1 g by mouth daily.      [provider]  latanoprost (XALATAN) 0.005 % ophthalmic solution Place 1 drop into both eyes at bedtime.    [provider]  levothyroxine (SYNTHROID, LEVOTHROID) 50 MCG tablet Take 1 tablet (50 mcg total) by mouth daily. 07/03/17   Joaquim Nam, MD  Multiple Vitamin (MULTIVITAMIN) tablet Take 1 tablet by mouth daily.    [provider]  traMADol Janean Sark) 50 MG tablet TAKE ONE AND ONE-HALF TABLETS THREE TIMES A DAY 07/03/17   Joaquim Nam, MD    Allergies Codeine; Cortisone; Decongestant [pseudoephedrine hcl]; Naproxen; and Simvastatin    Social History Social History   Tobacco Use  . Smoking status: Current Every Day Smoker    Packs/day: 0.25    Years: 50.00    Pack years: 12.50    Types: Cigarettes  .  Smokeless tobacco: Never Used  . Tobacco comment: started smoking in 1958  Substance Use Topics  . Alcohol use: No  . Drug use: No    Review of Systems Patient denies headaches, rhinorrhea, blurry vision, numbness, shortness of breath, chest pain, edema, cough, abdominal pain, nausea, vomiting, diarrhea, dysuria, fevers, rashes or hallucinations unless otherwise stated above in HPI. ____________________________________________   PHYSICAL EXAM:  VITAL SIGNS: Vitals:   09/09/17 1336  BP: 139/79  Pulse: 69  Resp: 18  Temp: 98.3 F (36.8 C)  SpO2: 98%    Constitutional: Alert and oriented. Well appearing and in no acute distress. Eyes: Conjunctivae are normal.  Head: Atraumatic. Nose: No congestion/rhinnorhea. Mouth/Throat: Mucous membranes are moist.   Neck: No stridor. Painless ROM.  Cardiovascular: irregularly irregular rhythm. Grossly normal heart sounds.  Good peripheral circulation. Respiratory: Normal respiratory effort.  No retractions. Lungs CTAB. Gastrointestinal: Soft and nontender. No distention. No abdominal bruits. No CVA tenderness. Genitourinary:  Musculoskeletal: No lower extremity tenderness nor edema.  No joint effusions. Neurologic:  Normal speech and language. No gross focal neurologic deficits are appreciated. No facial droop Skin:  Skin is warm, dry and intact. No rash noted. Psychiatric: Mood and affect are normal. Speech and behavior are normal.  ____________________________________________   LABS (all labs ordered are listed, but only abnormal results are displayed)  Results for orders placed or performed during the hospital encounter of 09/09/17 (from the past 24 hour(s))  CBC with Differential     Status: None   Collection Time: 09/09/17  2:11 PM  Result Value Ref Range   WBC 7.8 3.6 - 11.0 K/uL   RBC 4.73 3.80 - 5.20 MIL/uL   Hemoglobin 14.6 12.0 - 16.0 g/dL   HCT 16.1 09.6 - 04.5 %   MCV 91.9 80.0 - 100.0 fL   MCH 30.9 26.0 - 34.0 pg    MCHC 33.7 32.0 - 36.0 g/dL   RDW 40.9 81.1 - 91.4 %   Platelets 234 150 - 440 K/uL   Neutrophils Relative % 71 %   Neutro Abs 5.6 1.4 - 6.5 K/uL   Lymphocytes Relative 18 %   Lymphs Abs 1.4 1.0 - 3.6 K/uL   Monocytes Relative 9 %   Monocytes Absolute 0.7 0.2 - 0.9 K/uL   Eosinophils Relative 1 %   Eosinophils Absolute 0.1 0 - 0.7 K/uL   Basophils Relative 1 %   Basophils Absolute 0.1 0 - 0.1 K/uL  Comprehensive metabolic panel     Status: Abnormal   Collection Time: 09/09/17  2:11 PM  Result Value Ref Range   Sodium 137 135 - 145 mmol/L   Potassium 4.1 3.5 - 5.1 mmol/L   Chloride 102  101 - 111 mmol/L   CO2 25 22 - 32 mmol/L   Glucose, Bld 108 (H) 65 - 99 mg/dL   BUN 12 6 - 20 mg/dL   Creatinine, Ser 1.61 0.44 - 1.00 mg/dL   Calcium 9.4 8.9 - 09.6 mg/dL   Total Protein 7.1 6.5 - 8.1 g/dL   Albumin 4.0 3.5 - 5.0 g/dL   AST 28 15 - 41 U/L   ALT 13 (L) 14 - 54 U/L   Alkaline Phosphatase 52 38 - 126 U/L   Total Bilirubin 1.1 0.3 - 1.2 mg/dL   GFR calc non Af Amer >60 >60 mL/min   GFR calc Af Amer >60 >60 mL/min   Anion gap 10 5 - 15  Troponin I     Status: None   Collection Time: 09/09/17  2:11 PM  Result Value Ref Range   Troponin I <0.03 <0.03 ng/mL  TSH     Status: None   Collection Time: 09/09/17  2:11 PM  Result Value Ref Range   TSH 1.820 0.350 - 4.500 uIU/mL   ____________________________________________  EKG My review and personal interpretation at Time: 15:29   Indication: tachycardia  Rate: 120  Rhythm: afib with rvr Axis: normal Other: nonspeicific st abn likely rate dependent ____________________________________________  RADIOLOGY  I personally reviewed all radiographic images ordered to evaluate for the above acute complaints and reviewed radiology reports and findings.  These findings were personally discussed with the patient.  Please see medical record for radiology report.  ____________________________________________   PROCEDURES  Procedure(s)  performed:  Procedures    Critical Care performed: no ____________________________________________   INITIAL IMPRESSION / ASSESSMENT AND PLAN / ED COURSE  Pertinent labs & imaging results that were available during my care of the patient were reviewed by me and considered in my medical decision making (see chart for details).  DDX: afib with rvr, dehydration, electrolyte abn  ANGELLYNN KIMBERLIN is a 83 y.o. who presents to the ED with symptoms as described above.  Patient with evidence of paroxysmal A. fib with RVR.  While watching her on the cardiac monitor patient will have normal rate controlled sinus rhythm in the low 60s and then flipped into A. fib with RVR.  Blood work is otherwise reassuring.  No evidence of ACS.  Magnesium is normal and potassium is normal.  No evidence of cardiomegaly.  Patient is otherwise asymptomatic therefore I do suspect this is been ongoing for quite some time.  Discussed case with Dr. Kirke Corin of cardiology who agrees with starting patient on 120 extended release Cardizem with close follow-up in cardiology clinic this week.  The patient is currently rate controlled on oral medication I do believe she stable and appropriate for outpatient management.      As part of my medical decision making, I reviewed the following data within the electronic MEDICAL RECORD NUMBER Nursing notes reviewed and incorporated, Labs reviewed, notes from prior ED visits.  ____________________________________________   FINAL CLINICAL IMPRESSION(S) / ED DIAGNOSES  Final diagnoses:  Atrial fibrillation with rapid ventricular response (HCC)  Paroxysmal A-fib (HCC)      NEW MEDICATIONS STARTED DURING THIS VISIT:  New Prescriptions   No medications on file     Note:  This document was prepared using Dragon voice recognition software and may include unintentional dictation errors.    Willy Eddy, MD 09/09/17 1610

## 2017-09-09 NOTE — Discharge Instructions (Signed)
Please call Dr. Windell Hummingbird office tomorrow for appointment this week.  I am writing her prescription for Cardizem which is a once daily medication..  Return to the ER for any worsening symptoms including worsening shortness of breath, chest pain or feeling that you are about to faint or pass out.

## 2017-09-09 NOTE — Progress Notes (Signed)
She clearly had elevated pulse >100 initially on check. Then converted to slower rate on recheck ~70s by MD exam.  H/o PAF with prior colonoscopy.   Prev CT chest d/w pt.    She had episode of vertigo years ago. Daughter had called back about fatigue, dizziness, etc. D/w pt.  Her weight is down, per patient her appetite is "okay."    Denies chest pain, shortness of breath, lower extremity edema.  She does not feel her heart racing.  PMH and SH reviewed  ROS: Per HPI unless specifically indicated in ROS section   Meds, vitals, and allergies reviewed.   GEN: nad, alert and oriented, thin elderly woman. HEENT: mucous membranes moist NECK: supple w/o LA CV: She clearly had elevated pulse >100 initially on check. Then converted to slower rate on recheck ~70s by MD exam.  She then developed an irregularly irregular pulse, tachycardic. PULM: ctab, no inc wob ABD: soft, +bs EXT: no edema SKIN: no acute rash  EKG d/w pt.

## 2017-09-09 NOTE — ED Triage Notes (Signed)
Sent by MD for evaluation ov abnormal EKG. Arrives with copy of EKG. Denies any symptoms. States was at MD for recent weight loss.

## 2017-09-10 ENCOUNTER — Telehealth: Payer: Self-pay

## 2017-09-10 ENCOUNTER — Telehealth: Payer: Self-pay | Admitting: *Deleted

## 2017-09-10 NOTE — Telephone Encounter (Signed)
-----   Message from Iran Ouch, MD sent at 09/09/2017  4:00 PM EDT ----- This is a patient of Dr. Mariah Milling with paroxysmal atrial fibrillation.  She presented today to her primary care physician with A. fib with RVR and was sent to the emergency room.  Rate was controlled in the ED and she was discharged on diltiazem extended release 120 mg once daily. She needs a close follow-up with Dr. Mariah Milling or PA to discuss anticoagulation.

## 2017-09-10 NOTE — Telephone Encounter (Signed)
Daughter advised.

## 2017-09-10 NOTE — Assessment & Plan Note (Addendum)
She likely converted into A. fib at some point in the past.  It sounds like she converted in and out of A. fib during the exam.  Discussed with patient about options. She declined EMS transport- AMA.   D/w pt. advised her to go to the emergency room.  She will to drive herself.  No chest pain.  I advised her it was reasonable to go to the emergency room because she may need extra testing done there that we cannot turn around quickly enough here at the clinic.  It may end up being that she needs admission but I will need her to have emergency evaluation first.  Unclear how much this is affecting her dizziness and fatigue.  Her weight loss issue remains and we will need to address this, as that appears to be a separate issue.  Discussed with patient. >25 minutes spent in face to face time with patient, >50% spent in counselling or coordination of care.

## 2017-09-10 NOTE — Telephone Encounter (Signed)
Left voicemail message to call back  

## 2017-09-10 NOTE — Telephone Encounter (Signed)
Spoke with daughter Kelly.

## 2017-09-10 NOTE — Telephone Encounter (Signed)
Copied from CRM [id number]. Topic: Inquiry >> Sep 10, 2017  8:13 AM Windy Kalata, NT wrote: Reason for CRM: patient daughter [name] is calling and would like for Dr. Para March nurse to call her and inform her how her mothers (Vanessa [name]) appt went on 09/09/17  [phone]

## 2017-09-20 NOTE — Telephone Encounter (Signed)
Spoke with patient and she confirmed upcoming appointment with no further questions at this time.

## 2017-09-22 ENCOUNTER — Encounter: Payer: Self-pay | Admitting: Physician Assistant

## 2017-09-22 NOTE — Progress Notes (Signed)
Cardiology Office Note Date:  09/24/2017  Patient ID:  Vanessa, [name] [birth year], MRN [id number] PCP:  Joaquim Nam, MD  Cardiologist:  Dr. Mariah Milling, MD    Chief Complaint: ED follow up  History of Present Illness: Vanessa [name] is a 83 y.o. female with history of PAF not on full dose anticoagulation per primary cardiologist, chronic systolic CHF of uncertain etiology, pulmonary hypertension, PAD, COPD, with ongoing tobacco abuse, HLD with statin intolerance, hypothyroidism, and migraine disorder who presents for ED follow up of Afib.   She apparently had an episode of Afib following a colonoscopy in 2012. EKGs since then have demonstrated sinus bradycardia.   Most recent echo from 2012 showed an EF of 45-50%, mild diffuse HK, trivial AI, mild MR, mildly dilated RV with mild reduction of RVSF, mild to moderate TR, mild to moderate PR, mildly elevated PASP. No prior ischemic evaluations. Patient was most recently seen in 12/2016 for follow up and noted continued positional dizziness. She was drinking mainly Coke and eating sparingly during the day. She was noted to have a BP of 122/62, HR 52 bpm with a noted heart rate that typically runs in the 50s bpm. She was in sinus bradycardia at that time. It was uncertain if her dizziness was related to PAF at that time. Outpatient monitoring was considered. She was noted to also have been having some mild weight loss. Chest CT on 5/7 without clear cause for weight loss. There was some concern depression.   Patient was seen by PCP on 5/20 for ongoing weight loss (had lost 9 pounds between cardiology visit in 12/2016 to PCP visit on 09/09/2017). She was noted to be in Afib with RVR with heart rate in the 120s bpm. She was sent to the [facility] ED. BP stable. In the ED, the patient would briefly convert to sinus on her own, followed by redevelopment of Afib with RVR. Labs showed a K+ 4.1, SCr 0.84, TSH 1.820, Mg++ 2.1, unremarkable CBC, troponin < 0.03. CXR  showed COPD. She was started on Cardizem CD 120 mg daily and advised to follow up as an outpatient.   She comes in accompanied by her daughter today.  She continues to note intermittent dizziness, most recently on 09/22/2017.  This episode of dizziness prevented her from attending church that day.  She indicates these dizzy episodes are typically short-lived, lasting for several minutes and spontaneously resolving.  They are typically associated with positional changes.  Of note, when the patient was documented to be in A. fib with RVR on 5/20 at her PCPs office and in the ED the patient was asymptomatic and had no dizziness.  Patient does have poor p.o. intake since the death of her spouse.  She is not supplementing with protein shakes.  She eats approximately once per day.  No chest pain, palpitations, shortness of breath, nausea, vomiting, presyncope, or syncope.  No falls within the past 12 months.  No BRBPR or melena.  She has tolerated Cardizem CD 120 mg without issues.  She typically takes this medication just before going to bed.  Not checking her heart rate or blood pressure at home.  She has a stable one pillow orthopnea.  No lower extremity swelling, abdominal distention, PND, cough, orthopnea, or early satiety.  Past Medical History:  Diagnosis Date  . Anemia    treated ~2009 with iron, resolved  . Arthritis   . Cataract    surgery on R catarct 2012  . Chronic  systolic CHF (congestive heart failure) (HCC)    a. TTE 2012: EF of 45-50%, mild diffuse HK, trivial AI, mild MR, mildly dilated RV with mild reduction of RVSF, mild to moderate TR, mild to moderate PR, mildly elevated PASP  . COPD (chronic obstructive pulmonary disease) (HCC)   . Family history of adverse reaction to anesthesia    Mother - altered mental status (long term)  . Glaucoma   . Hyperglycemia    mild inc in fasting sugar  . Hyperlipidemia   . Hypothyroidism   . Insomnia   . Migraines    without aura, sx since age 56    . Osteopenia    prev with 10 years of fosamax and intolerant of evista  . PAF (paroxysmal atrial fibrillation) (HCC)    a. initially noted 2012; b. CHADS2VASc at least 5 (CHF, age x 2, vasacular disease, female)  . Pulmonary hypertension (HCC)   . Wears dentures    full upper and lower    Past Surgical History:  Procedure Laterality Date  . CATARACT EXTRACTION    . CATARACT EXTRACTION W/PHACO Left 08/27/2016   Procedure: CATARACT EXTRACTION PHACO AND INTRAOCULAR LENS PLACEMENT (IOC)  Left;  Surgeon: Lockie Mola, MD;  Location: [facility];  Service: Ophthalmology;  Laterality: Left;  . COLONOSCOPY  2012  . REFRACTIVE SURGERY      Current Meds  Medication Sig  . aspirin 81 MG EC tablet Take 81 mg by mouth daily.    Marland Kitchen aspirin-acetaminophen-caffeine (EXCEDRIN MIGRAINE) 250-250-65 MG per tablet Take 1 tablet by mouth every 6 (six) hours as needed.    . diltiazem (CARDIZEM CD) 120 MG 24 hr capsule Take 1 capsule (120 mg total) by mouth daily.  . fish oil-omega-3 fatty acids 1000 MG capsule Take 1 g by mouth daily.    Marland Kitchen latanoprost (XALATAN) 0.005 % ophthalmic solution Place 1 drop into both eyes at bedtime.  Marland Kitchen levothyroxine (SYNTHROID, LEVOTHROID) 50 MCG tablet Take 1 tablet (50 mcg total) by mouth daily.  . Multiple Vitamin (MULTIVITAMIN) tablet Take 1 tablet by mouth daily.  . traMADol (ULTRAM) 50 MG tablet TAKE ONE AND ONE-HALF TABLETS THREE TIMES A DAY    Allergies:   Codeine; Cortisone; Decongestant [pseudoephedrine hcl]; Naproxen; and Simvastatin   Social History:  The patient  reports that she has been smoking cigarettes.  She has a 12.50 pack-year smoking history. She has never used smokeless tobacco. She reports that she does not drink alcohol or use drugs.   Family History:  The patient's family history includes Diabetes in her father; Migraines in her mother; Stroke in her mother.  ROS:   Review of Systems  Constitutional: Positive for malaise/fatigue.  Negative for chills, diaphoresis, fever and weight loss.  HENT: Negative for congestion.   Eyes: Negative for discharge and redness.  Respiratory: Negative for cough, hemoptysis, sputum production, shortness of breath and wheezing.   Cardiovascular: Negative for chest pain, palpitations, orthopnea, claudication, leg swelling and PND.  Gastrointestinal: Negative for abdominal pain, blood in stool, heartburn, melena, nausea and vomiting.  Genitourinary: Negative for hematuria.  Musculoskeletal: Negative for falls and myalgias.  Skin: Negative for rash.  Neurological: Positive for dizziness and weakness. Negative for tingling, tremors, sensory change, speech change, focal weakness and loss of consciousness.  Endo/Heme/Allergies: Does not bruise/bleed easily.  Psychiatric/Behavioral: Negative for substance abuse. The patient is not nervous/anxious.   All other systems reviewed and are negative.    PHYSICAL EXAM:  VS:  BP 128/80 (BP  Location: Left Arm, Patient Position: Sitting, Cuff Size: Normal)   Pulse (!) 58   Ht 5\' 2"  (1.575 m)   Wt 92 lb 12 oz (42.1 kg)   BMI 16.96 kg/m  BMI: Body mass index is 16.96 kg/m.  Physical Exam  Constitutional: She is oriented to person, place, and time. She appears well-developed and well-nourished.  HENT:  Head: Normocephalic and atraumatic.  Eyes: Right eye exhibits no discharge. Left eye exhibits no discharge.  Neck: Normal range of motion. No JVD present.  Cardiovascular: Regular rhythm, S1 normal, S2 normal and normal heart sounds. Bradycardia present. Exam reveals no distant heart sounds, no friction rub, no midsystolic click and no opening snap.  No murmur heard. Pulses:      Posterior tibial pulses are 2+ on the right side, and 2+ on the left side.  Pulmonary/Chest: Effort normal and breath sounds normal. No respiratory distress. She has no decreased breath sounds. She has no wheezes. She has no rales. She exhibits no tenderness.  Abdominal:  Soft. She exhibits no distension. There is no tenderness.  Musculoskeletal: She exhibits no edema.  Neurological: She is alert and oriented to person, place, and time.  Skin: Skin is warm and dry. No cyanosis. Nails show no clubbing.  Psychiatric: She has a normal mood and affect. Her speech is normal and behavior is normal. Judgment and thought content normal.     EKG:  Was ordered and interpreted by me today. Shows sinus bradycardia, 58 bpm, no acute ST-T changes  Recent Labs: 09/09/2017: ALT 13; BUN 12; Creatinine, Ser 0.84; Hemoglobin 14.6; Magnesium 2.1; Platelets 234; Potassium 4.1; Sodium 137; TSH 1.820  07/19/2017: Cholesterol 197; HDL 61.10; LDL Cholesterol 122; Total CHOL/HDL Ratio 3; Triglycerides 73.0; VLDL 14.6   Estimated Creatinine Clearance: 35.5 mL/min (by C-G formula based on SCr of 0.84 mg/dL).   Wt Readings from Last 3 Encounters:  09/24/17 92 lb 12 oz (42.1 kg)  09/09/17 90 lb 2 oz (40.9 kg)  09/09/17 90 lb 4 oz (40.9 kg)     Other studies reviewed: Additional studies/records reviewed today include: summarized above  ASSESSMENT AND PLAN:  1. PAF: Currently in sinus rhythm with a mildly bradycardic heart rate of 58 bpm.  Her baseline sinus rate is mildly bradycardic in the mid to upper 50s bpm.  Long discussion with patient and daughter regarding evaluation and treatment of atrial fibrillation including rate control, rhythm control, and full dose anticoagulation.  Given the patient's elevated CHADS2VASc of at least 5 (CHF, age x 2, vascular disease, female) we have agreed to start Eliquis 2.5 mg twice daily (she meets reduced dosing criteria secondary to age and weight).  Continue Cardizem CD 120 mg daily for rate control.  Have patient wear a Zio monitor to evaluate A. fib burden as well as for possible rate/rhythm related dizziness.  Should she be found to have significant breakthrough A. fib on a low-dose Cardizem we may need to initiate antiarrhythmic therapy in an  effort to maintain sinus rhythm given it will be difficult to escalate rate control secondary to her baseline sinus bradycardia.  If she demonstrates a history of tachybradycardia syndrome she may require PPM.  Check echocardiogram.  Labs checked in the ED showed normal potassium, magnesium, and thyroid function.  May need ischemic evaluation.  2. Chronic systolic CHF: She does not appear grossly volume overloaded at this time.  No prior ischemic evaluations.  She is at increased risk for ischemic cardiomyopathy given her ongoing tobacco abuse.  Check echocardiogram as above.  Will likely need at least a Lexiscan Myoview to evaluate for high risk ischemia.  If she is found to have significant cardiomyopathy, would prefer to discontinue diltiazem and placed on Toprol or carvedilol, as well as escalate further evidence-based heart failure therapy.  3. Dizziness/orthostasis: Likely in the setting of the patient's poor p.o. intake.  Of note, patient had no dizziness when she was documented to be in A. fib with RVR.  Outpatient cardiac monitoring as above.  Should her symptoms persist with caloric supplementation as detailed below could consider escalation of medical therapy.  4. Hyperlipidemia: Previously reported myalgias on statins.  Not currently on a statin.  5. Weight loss: Her poor p.o. intake is likely contributing to her dizziness.  I have recommended she drink an Ensure or Boost several times per day.  Disposition: F/u with me in 6 to 8 weeks.  Current medicines are reviewed at length with the patient today.  The patient did not have any concerns regarding medicines.  Signed, Eula Listen, PA-C 09/24/2017 8:57 AM     [facility] [place] [address] [place], [place] [address] [phone]

## 2017-09-24 ENCOUNTER — Ambulatory Visit: Payer: PPO | Admitting: Physician Assistant

## 2017-09-24 ENCOUNTER — Ambulatory Visit (INDEPENDENT_AMBULATORY_CARE_PROVIDER_SITE_OTHER): Payer: PPO

## 2017-09-24 ENCOUNTER — Encounter: Payer: Self-pay | Admitting: Physician Assistant

## 2017-09-24 VITALS — BP 128/80 | HR 58 | Ht 62.0 in | Wt 92.8 lb

## 2017-09-24 DIAGNOSIS — I480 Paroxysmal atrial fibrillation: Secondary | ICD-10-CM

## 2017-09-24 DIAGNOSIS — E46 Unspecified protein-calorie malnutrition: Secondary | ICD-10-CM

## 2017-09-24 DIAGNOSIS — R42 Dizziness and giddiness: Secondary | ICD-10-CM

## 2017-09-24 DIAGNOSIS — E782 Mixed hyperlipidemia: Secondary | ICD-10-CM

## 2017-09-24 DIAGNOSIS — I951 Orthostatic hypotension: Secondary | ICD-10-CM

## 2017-09-24 DIAGNOSIS — I5022 Chronic systolic (congestive) heart failure: Secondary | ICD-10-CM

## 2017-09-24 MED ORDER — APIXABAN 2.5 MG PO TABS: 2.5000 mg | ORAL_TABLET | Freq: Two times a day (BID) | ORAL | 2 refills | Status: DC

## 2017-09-24 MED ORDER — DILTIAZEM HCL ER COATED BEADS 120 MG PO CP24: 120.0000 mg | ORAL_CAPSULE | Freq: Every day | ORAL | 3 refills | Status: DC

## 2017-09-24 NOTE — Patient Instructions (Signed)
Medication Instructions:  Your physician has recommended you make the following change in your medication:  1- STOP Aspirin. 2- START Eliquis 2.5 mg (1 tablet) by mouth two times a day.   Labwork: none  Testing/Procedures: Your physician has recommended that you wear an 14 DAY ZIO event monitor. Event monitors are medical devices that record the heart's electrical activity. Doctors most often us these monitors to diagnose arrhythmias. Arrhythmias are problems with the speed or rhythm of the heartbeat. The monitor is a small, portable device. You can wear one while you do your normal daily activities. This is usually used to diagnose what is causing palpitations/syncope (passing out). - A Zio Patch Event Heart monitor will be applied to your chest today.  You will wear the patch for 14 days. After 24 hours, you may shower with the heart monitor on. If you feel any SYMPTOMS, you may press and release the button in the middle of the monitor. - REMOVE ON 10/08/17.    Your physician has requested that you have an echocardiogram. Echocardiography is a painless test that uses sound waves to create images of your heart. It provides your doctor with information about the size and shape of your heart and how well your heart's chambers and valves are working. This procedure takes approximately one hour. There are no restrictions for this procedure. You may get an IV, if needed, to receive an ultrasound enhancing agent through to better visualize your heart.     Follow-Up: Your physician recommends that you schedule a follow-up appointment in: 6-8 WEEKS WITH RYAN.   If you need a refill on your cardiac medications before your next appointment, please call your pharmacy.   Echocardiogram An echocardiogram, or echocardiography, uses sound waves (ultrasound) to produce an image of your heart. The echocardiogram is simple, painless, obtained within a short period of time, and offers valuable  information to your health care provider. The images from an echocardiogram can provide information such as:  Evidence of coronary artery disease (CAD).  Heart size.  Heart muscle function.  Heart valve function.  Aneurysm detection.  Evidence of a past heart attack.  Fluid buildup around the heart.  Heart muscle thickening.  Assess heart valve function.  Tell a health care provider about:  Any allergies you have.  All medicines you are taking, including vitamins, herbs, eye drops, creams, and over-the-counter medicines.  Any problems you or family members have had with anesthetic medicines.  Any blood disorders you have.  Any surgeries you have had.  Any medical conditions you have.  Whether you are pregnant or may be pregnant. What happens before the procedure? No special preparation is needed. Eat and drink normally. What happens during the procedure?  In order to produce an image of your heart, gel will be applied to your chest and a wand-like tool (transducer) will be moved over your chest. The gel will help transmit the sound waves from the transducer. The sound waves will harmlessly bounce off your heart to allow the heart images to be captured in real-time motion. These images will then be recorded.  You may need an IV to receive a medicine that improves the quality of the pictures. What happens after the procedure? You may return to your normal schedule including diet, activities, and medicines, unless your health care provider tells you otherwise. This information is not intended to replace advice given to you by your health care provider. Make sure you discuss any questions you have with your  health care provider. Document Released: 04/06/2000 Document Revised: 11/26/2015 Document Reviewed: 12/15/2012 Elsevier Interactive Patient Education  2017 [employer].

## 2017-09-27 DIAGNOSIS — H401131 Primary open-angle glaucoma, bilateral, mild stage: Secondary | ICD-10-CM | POA: Diagnosis not present

## 2017-10-04 DIAGNOSIS — H401131 Primary open-angle glaucoma, bilateral, mild stage: Secondary | ICD-10-CM | POA: Diagnosis not present

## 2017-10-10 ENCOUNTER — Other Ambulatory Visit: Payer: Self-pay

## 2017-10-10 ENCOUNTER — Ambulatory Visit (INDEPENDENT_AMBULATORY_CARE_PROVIDER_SITE_OTHER): Payer: PPO

## 2017-10-10 DIAGNOSIS — I480 Paroxysmal atrial fibrillation: Secondary | ICD-10-CM | POA: Diagnosis not present

## 2017-10-10 DIAGNOSIS — R42 Dizziness and giddiness: Secondary | ICD-10-CM | POA: Diagnosis not present

## 2017-10-14 DIAGNOSIS — R42 Dizziness and giddiness: Secondary | ICD-10-CM | POA: Diagnosis not present

## 2017-11-11 NOTE — Progress Notes (Signed)
Cardiology Office Note Date:  11/12/2017  Patient ID:  Vanessa [name], [place] [birth year], MRN [id number] PCP:  Joaquim Nam, MD  Cardiologist:  Dr. Mariah Milling, MD    Chief Complaint: Follow up heart monitor  History of Present Illness: Vanessa [name] is a 83 y.o. female with history of reported PAF on Eliquis, chronic systolic CHF of uncertain etiology, pulmonary hypertension, PAD, COPD, with ongoing tobacco abuse, HLD with statin intolerance, hypothyroidism, and migraine disorder who presents for follow up of outpatient cardiac monitoring.   She apparently had an episode of Afib following a colonoscopy in 2012. EKGs since then have demonstrated sinus bradycardia.   Echo from 2012 showed an EF of 45-50%, mild diffuse HK, trivial AI, mild MR, mildly dilated RV with mild reduction of RVSF, mild to moderate TR, mild to moderate PR, mildly elevated PASP. No prior ischemic evaluations. Patient was seen in 12/2016 for follow up and noted continued positional dizziness. She was drinking mainly Coke and eating sparingly during the day. She was noted to have a BP of 122/62, HR 52 bpm with a noted heart rate that typically runs in the 50s bpm. It was uncertain if her dizziness was related to PAF at that time. Outpatient monitoring was considered. She was noted to also have been having some mild weight loss. Chest CT on 5/7 without clear cause for weight loss. There was some concern depression. Patient was seen by PCP on 5/20 for ongoing weight loss (had lost 9 pounds between cardiology visit in 12/2016 to PCP visit on 09/09/2017). She was noted to be in Afib with RVR with heart rate in the 120s bpm. She was sent to the [facility] ED. BP stable. In the ED, the patient would briefly convert to sinus on her own, followed by redevelopment of Afib with RVR. Labs were unrevealing. CXR showed COPD. She was started on Cardizem CD 120 mg daily and advised to follow up as an outpatient. In ED follow up on 6/4, she continued to  note intermittent, short-lived dizziness. She continued to have a poor PO intake following the death of her husband. Echo on October 23, 2017 showed a stable, slightly reduced EF of 45-50%, Gr2DD, mild MR, RVSF normal, PASP 42 mmHg. Zio monitor showed NSR with an average heart rate of 63 bpm. There were 7,793 runs of SVT/atrial tach with the fastest run lasting 3 minutes 43 seconds with a max rate of 171 bpm and the longest run lasting 18 min 35 seconds.  Only a few of the patient triggered events corresponded to these runs, with most episodes the patient was asymptomatic. Minimum heart rate of 35 bpm at 1:33 AM on 6/10.  She comes in accompanied by her daughter today.  She has continued to note intermittent dizziness and fatigue.  No chest pain, shortness of breath, or palpitations.  No recent falls.  No syncopal events.  She has been compliant with her Eliquis 2.5 mg twice daily (reduced dosing secondary to age and weight) without any BRBPR or melena.  She typically takes her Cardizem CD 120 mg around lunchtime.  She does note positional unsteadiness.  She continues to struggle with appetite, though her weight is up 3 pounds when compared to her office visit on 6/4.  Lastly, she has noted some bilateral lower extremity aching/restlessness that occurs in the evening hours.  She is able to walk her dog several times per day without symptoms.  Past Medical History:  Diagnosis Date  . Anemia  treated ~2009 with iron, resolved  . Arthritis   . Cataract    surgery on R catarct 2012  . Chronic systolic CHF (congestive heart failure) (HCC)    a. TTE 2012: EF of 45-50%, mild diffuse HK, trivial AI, mild MR, mildly dilated RV with mild reduction of RVSF, mild to moderate TR, mild to moderate PR, mildly elevated PASP  . COPD (chronic obstructive pulmonary disease) (HCC)   . Family history of adverse reaction to anesthesia    Mother - altered mental status (long term)  . Glaucoma   . Hyperglycemia    mild inc in  fasting sugar  . Hyperlipidemia   . Hypothyroidism   . Insomnia   . Migraines    without aura, sx since age 83  . Osteopenia    prev with 10 years of fosamax and intolerant of evista  . PAF (paroxysmal atrial fibrillation) (HCC)    a. initially noted 2012; b. CHADS2VASc at least 5 (CHF, age x 2, vasacular disease, female)  . Pulmonary hypertension (HCC)   . Wears dentures    full upper and lower    Past Surgical History:  Procedure Laterality Date  . CATARACT EXTRACTION    . CATARACT EXTRACTION W/PHACO Left 08/27/2016   Procedure: CATARACT EXTRACTION PHACO AND INTRAOCULAR LENS PLACEMENT (IOC)  Left;  Surgeon: Lockie MolaBrasington, Chadwick, MD;  [facility];  Service: Ophthalmology;  Laterality: Left;  . COLONOSCOPY  2012  . REFRACTIVE SURGERY      Current Meds  Medication Sig  . apixaban (ELIQUIS) 2.5 MG TABS tablet Take 1 tablet (2.5 mg total) by mouth 2 (two) times daily.  Marland Kitchen. aspirin-acetaminophen-caffeine (EXCEDRIN MIGRAINE) 250-250-65 MG per tablet Take 1 tablet by mouth every 6 (six) hours as needed.    . diltiazem (CARDIZEM CD) 120 MG 24 hr capsule Take 1 capsule (120 mg total) by mouth daily.  . fish oil-omega-3 fatty acids 1000 MG capsule Take 1 g by mouth daily.    Marland Kitchen. latanoprost (XALATAN) 0.005 % ophthalmic solution Place 1 drop into both eyes at bedtime.  Marland Kitchen. levothyroxine (SYNTHROID, LEVOTHROID) 50 MCG tablet Take 1 tablet (50 mcg total) by mouth daily.  . Multiple Vitamin (MULTIVITAMIN) tablet Take 1 tablet by mouth daily.  . traMADol (ULTRAM) 50 MG tablet TAKE ONE AND ONE-HALF TABLETS THREE TIMES A DAY    Allergies:   Codeine; Cortisone; Decongestant [pseudoephedrine hcl]; Naproxen; and Simvastatin   Social History:  The patient  reports that she has been smoking cigarettes.  She has a 12.50 pack-year smoking history. She has never used smokeless tobacco. She reports that she does not drink alcohol or use drugs.   Family History:  The patient's family history  includes Diabetes in her father; Migraines in her mother; Stroke in her mother.  ROS:   Review of Systems  Constitutional: Positive for malaise/fatigue and weight loss. Negative for chills, diaphoresis and fever.  HENT: Negative for congestion.   Eyes: Negative for discharge and redness.  Respiratory: Negative for cough, hemoptysis, sputum production, shortness of breath and wheezing.   Cardiovascular: Negative for chest pain, palpitations, orthopnea, claudication, leg swelling and PND.  Gastrointestinal: Negative for abdominal pain, blood in stool, heartburn, melena, nausea and vomiting.  Genitourinary: Negative for hematuria.  Musculoskeletal: Positive for myalgias. Negative for falls.  Skin: Negative for rash.  Neurological: Positive for dizziness and weakness. Negative for tingling, tremors, sensory change, speech change, focal weakness and loss of consciousness.  Endo/Heme/Allergies: Does not bruise/bleed easily.  Psychiatric/Behavioral:  Negative for substance abuse. The patient is not nervous/anxious.   All other systems reviewed and are negative.    PHYSICAL EXAM:  VS:  BP 120/60 (BP Location: Left Arm, Patient Position: Sitting, Cuff Size: Normal)   Pulse (!) 48   Ht 5\' 2"  (1.575 m)   Wt 95 lb 4 oz (43.2 kg)   BMI 17.42 kg/m  BMI: Body mass index is 17.42 kg/m.  Physical Exam  Constitutional: She is oriented to person, place, and time. She appears well-developed and well-nourished.  HENT:  Head: Normocephalic and atraumatic.  Eyes: Right eye exhibits no discharge. Left eye exhibits no discharge.  Neck: Normal range of motion. No JVD present.  Cardiovascular: Regular rhythm, S1 normal, S2 normal and normal heart sounds. Bradycardia present. Exam reveals no distant heart sounds, no friction rub, no midsystolic click and no opening snap.  No murmur heard. Pulses:      Posterior tibial pulses are 2+ on the right side, and 2+ on the left side.  Pulmonary/Chest: Effort normal  and breath sounds normal. No respiratory distress. She has no decreased breath sounds. She has no wheezes. She has no rales. She exhibits no tenderness.  Abdominal: Soft. She exhibits no distension. There is no tenderness.  Musculoskeletal: She exhibits no edema.  Neurological: She is alert and oriented to person, place, and time.  Skin: Skin is warm and dry. No cyanosis. Nails show no clubbing.  Psychiatric: She has a normal mood and affect. Her speech is normal and behavior is normal. Judgment and thought content normal.     EKG:  Was ordered and interpreted by me today. Shows sinus bradycardia, 48 bpm, nonspecific ST-T changes  Recent Labs: 09/09/2017: ALT 13; BUN 12; Creatinine, Ser 0.84; Hemoglobin 14.6; Magnesium 2.1; Platelets 234; Potassium 4.1; Sodium 137; TSH 1.820  07/19/2017: Cholesterol 197; HDL 61.10; LDL Cholesterol 122; Total CHOL/HDL Ratio 3; Triglycerides 73.0; VLDL 14.6   CrCl cannot be calculated (Patient's most recent lab result is older than the maximum 21 days allowed.).   Wt Readings from Last 3 Encounters:  11/12/17 95 lb 4 oz (43.2 kg)  09/24/17 92 lb 12 oz (42.1 kg)  09/09/17 90 lb 2 oz (40.9 kg)     Other studies reviewed: Additional studies/records reviewed today include: summarized above  ASSESSMENT AND PLAN:  1. SVT/atrial tach/tachybrady-syndrome/dizziness/weakness: Patient with 7800 SVT/atrial tach runs noted on recent ZIO monitor.  Patient only triggered the device several times during what appears to be nearly continuous runs of arrhythmia.  However, patient notes today that her baseline sensation is dizziness so she did not continuously trigger the device.  This is an overall difficult situation given her baseline sinus rate is bradycardic preventing escalation of rate control therapy.  Of note, the patient's heart rate is 48 bpm today and she has not yet taken her Cardizem CD 120 mg.  Ultimately, patient may require ablation with possible pacemaker  implantation.  She has been referred to EP and they have evaluated her in the office today.  We have discontinued Cardizem given her bradycardic heart rate and she will be initiated on flecainide therapy per EP.  Recent labs including TSH, magnesium, and potassium were unrevealing.  Case has been discussed with the patient's primary cardiologist.  2. Reported PAF: Less likely A. fib.  Maintaining sinus rhythm.  Eliquis has been discontinued by EP.  Flecainide initiated by EP.  Cardizem held as above.    3. Chronic systolic CHF: She does not appear grossly volume  overloaded at this time.  However, I did discuss with the patient and her daughter my concerns regarding her arrhythmia burden that she certainly may easily become volume overloaded with a tachycardia mediated cardiomyopathy.  CHF education was discussed.  Not currently on standing diuretic therapy.  Heart rate precludes addition of beta-blocker at this time.  Not on ACE inhibitor or spironolactone given need for blood pressure room with heart rate control.  4. Weight loss: Weight up 3 pounds from last office visit.  Continue to supplement p.o. intake with Ensure or Boost.  5. Hyperlipidemia: Intolerant to statins secondary to myalgias.  Disposition: F/u with EP as above and general cardiology in 3 months.    Current medicines are reviewed at length with the patient today.  The patient did not have any concerns regarding medicines.  Signed, Eula Listen, PA-C 11/12/2017 9:15 AM     [facility] [place] [address] [place], [place] [address] [phone]

## 2017-11-12 ENCOUNTER — Encounter: Payer: Self-pay | Admitting: Physician Assistant

## 2017-11-12 ENCOUNTER — Ambulatory Visit: Payer: PPO | Admitting: Physician Assistant

## 2017-11-12 ENCOUNTER — Ambulatory Visit (INDEPENDENT_AMBULATORY_CARE_PROVIDER_SITE_OTHER): Payer: PPO | Admitting: Internal Medicine

## 2017-11-12 VITALS — BP 120/60 | HR 48 | Ht 62.0 in | Wt 95.2 lb

## 2017-11-12 DIAGNOSIS — I471 Supraventricular tachycardia: Secondary | ICD-10-CM

## 2017-11-12 DIAGNOSIS — I495 Sick sinus syndrome: Secondary | ICD-10-CM

## 2017-11-12 DIAGNOSIS — R5382 Chronic fatigue, unspecified: Secondary | ICD-10-CM

## 2017-11-12 DIAGNOSIS — E46 Unspecified protein-calorie malnutrition: Secondary | ICD-10-CM | POA: Diagnosis not present

## 2017-11-12 DIAGNOSIS — I5022 Chronic systolic (congestive) heart failure: Secondary | ICD-10-CM | POA: Diagnosis not present

## 2017-11-12 DIAGNOSIS — F17200 Nicotine dependence, unspecified, uncomplicated: Secondary | ICD-10-CM

## 2017-11-12 DIAGNOSIS — E782 Mixed hyperlipidemia: Secondary | ICD-10-CM | POA: Diagnosis not present

## 2017-11-12 MED ORDER — FLECAINIDE ACETATE 50 MG PO TABS: 50.0000 mg | ORAL_TABLET | Freq: Two times a day (BID) | ORAL | 6 refills | Status: DC

## 2017-11-12 NOTE — Patient Instructions (Addendum)
Patient to see Dr Graciela HusbandsKlein this morning as well.

## 2017-11-12 NOTE — Patient Instructions (Addendum)
Medication Instructions: - Your physician has recommended you make the following change in your medication:   1) STOP eliquis 2) STOP diltiazem 3) START flecainide 50 mg- take 1 tablet by mouth twice daily  Labwork: - none ordered  Procedures/Testing: - none ordered  Follow-Up: - Your physician recommends that you schedule a follow-up appointment in: 3 weeks with Dr. Graciela HusbandsKlein.   Any Additional Special Instructions Will Be Listed Below (If Applicable).     If you need a refill on your cardiac medications before your next appointment, please call your pharmacy.

## 2017-11-13 NOTE — Progress Notes (Signed)
errpr

## 2017-11-23 ENCOUNTER — Telehealth: Payer: Self-pay | Admitting: Family Medicine

## 2017-11-23 DIAGNOSIS — R918 Other nonspecific abnormal finding of lung field: Secondary | ICD-10-CM

## 2017-11-23 NOTE — Telephone Encounter (Signed)
Call patient.  She is due for follow-up chest CT.  To be done without contrast.  I put in the order.  Thanks.

## 2017-11-25 NOTE — Telephone Encounter (Signed)
Patient notified as instructed by telephone and verbalized understanding. Patient stated that she wanted to make sure that Dr. Para Marchuncan is aware that she is going to have a pacemaker put in soon. Advised patient that one of the referral coordinators will be in touch to get this test set up for her.

## 2017-11-27 NOTE — Telephone Encounter (Signed)
Duly noted.  I will defer to cardiology, they have been sending me notes.  I appreciate the help of all involved.

## 2017-11-27 NOTE — Telephone Encounter (Signed)
Patient notified as instructed by telephone and verbalized understanding. 

## 2017-11-28 ENCOUNTER — Ambulatory Visit: Payer: PPO

## 2017-12-05 ENCOUNTER — Encounter: Payer: Self-pay | Admitting: Internal Medicine

## 2017-12-05 ENCOUNTER — Ambulatory Visit: Payer: PPO | Admitting: Internal Medicine

## 2017-12-05 VITALS — BP 110/78 | HR 108 | Ht 62.0 in | Wt 95.0 lb

## 2017-12-05 DIAGNOSIS — I471 Supraventricular tachycardia, unspecified: Secondary | ICD-10-CM

## 2017-12-05 DIAGNOSIS — I495 Sick sinus syndrome: Secondary | ICD-10-CM | POA: Diagnosis not present

## 2017-12-05 MED ORDER — METOPROLOL TARTRATE 25 MG PO TABS: 12.5000 mg | ORAL_TABLET | Freq: Two times a day (BID) | ORAL | 6 refills | Status: DC

## 2017-12-05 NOTE — Progress Notes (Signed)
Patient Care Team: Joaquim Namuncan, Graham S, MD as PCP - General (Family Medicine) Lockie MolaBrasington, Chadwick, MD as Referring Physician (Ophthalmology) Antonieta IbaGollan, Timothy J, MD as Consulting Physician (Cardiology)   HPI  Vanessa [name] is a 83 y.o. female  Seen in follow-up for tachycardia.  I saw her 11/12/2017 and failed to write a note.  She had been seen earlier that day by RD-PA.  She had been noted to have "atrial fibrillation "and some degree of bradycardia.  Heart rates in AF were in the 120s and in sinus rhythm were in the 50s.  His zio monitor had demonstrated recurrent episodes of tachycardia up to 170.  We ended up discontinuing her Cardizem and beginning her on flecainide review of the strips suggested that it was more of an atrial tachycardia is in atrial fibrillation and we discontinued her Eliquis  Seh is having significantly less dizziness which was the symptoms associated with her tachycardia.  No slow heart rates noted.  Tolerating the flecainide.  DATE PR interval QRSduration Dose  7/19  148 78 0  8/19 168 82 50 F     Records and Results Reviewed   Past Medical History:  Diagnosis Date  . Anemia    treated ~2009 with iron, resolved  . Arthritis   . Cataract    surgery on R catarct 2012  . Chronic systolic CHF (congestive heart failure) (HCC)    a. TTE 2012: EF of 45-50%, mild diffuse HK, trivial AI, mild MR, mildly dilated RV with mild reduction of RVSF, mild to moderate TR, mild to moderate PR, mildly elevated PASP  . COPD (chronic obstructive pulmonary disease) (HCC)   . Family history of adverse reaction to anesthesia    Mother - altered mental status (long term)  . Glaucoma   . Hyperglycemia    mild inc in fasting sugar  . Hyperlipidemia   . Hypothyroidism   . Insomnia   . Migraines    without aura, sx since age 83  . Osteopenia    prev with 10 years of fosamax and intolerant of evista  . PAF (paroxysmal atrial fibrillation) (HCC)    a. initially  noted 2012; b. CHADS2VASc at least 5 (CHF, age x 2, vasacular disease, female)  . Pulmonary hypertension (HCC)   . Wears dentures    full upper and lower    Past Surgical History:  Procedure Laterality Date  . CATARACT EXTRACTION    . CATARACT EXTRACTION W/PHACO Left 08/27/2016   Procedure: CATARACT EXTRACTION PHACO AND INTRAOCULAR LENS PLACEMENT (IOC)  Left;  Surgeon: Lockie MolaBrasington, Chadwick, MD;  [facility];  Service: Ophthalmology;  Laterality: Left;  . COLONOSCOPY  2012  . REFRACTIVE SURGERY      No outpatient medications have been marked as taking for the 12/05/17 encounter (Office Visit) with Duke SalviaKlein, Jasmyn Picha C, MD.    Allergies  Allergen Reactions  . Codeine     Doesn't remember reaction  . Cortisone     Doesn't remember reaction  . Decongestant [Pseudoephedrine Hcl] Other (See Comments)    Avoid decongestants due to glaucoma hx.    . Naproxen     Stomach issues  . Simvastatin     aches      Review of Systems negative except from HPI and PMH  Physical Exam BP 110/78 (BP Location: Left Arm, Patient Position: Sitting, Cuff Size: Normal)   Ht 5\' 2"  (1.575 m)   Wt 95 lb (43.1 kg)   BMI 17.38  kg/m  Well developed and well nourished in no acute distress HENT normal E scleral and icterus clear Neck Supple JVP flat; carotids brisk and full Clear to ausculation Regular rate and rhythm, no murmurs gallops or rub Soft with active bowel sounds No clubbing cyanosis  Edema Alert and oriented, grossly normal motor and sensory function Skin Warm and Dry  Smells like smoke  ECG 99 15/08/34  Assessment and  Plan  Atrial tach  Sinus brady  cig abuse   Atrial tach seems better controlled as she is having less dizziness  HR are much faster raising q of mechanism, but P wave morphologies so most likely sinus  Will add low dose BB  Metop tart 12.5 bid and track her HR at home to assure no significant brady  We spent more than 50% of our >25 min visit  in face to face counseling regarding the above   Not interested in stopping smoking   Current medicines are reviewed at length with the patient today .  The patient does not  have concerns regarding medicines.

## 2017-12-05 NOTE — Patient Instructions (Addendum)
Medication Instructions: - Your physician has recommended you make the following change in your medication:   1) Start metoprolol tartrate 25 mg- take 1/2 tablet (12.5 mg) by mouth TWICE daily  Labwork: - none ordered  Procedures/Testing: - none ordered  Follow-Up: - Your physician recommends that you schedule a follow-up appointment in: 4 weeks with Eula Listenyan Dunn, PA.   Any Additional Special Instructions Will Be Listed Below (If Applicable).     If you need a refill on your cardiac medications before your next appointment, please call your pharmacy.

## 2018-01-02 ENCOUNTER — Other Ambulatory Visit: Payer: Self-pay | Admitting: *Deleted

## 2018-01-02 MED ORDER — TRAMADOL HCL 50 MG PO TABS: ORAL_TABLET | ORAL | 1 refills | Status: DC

## 2018-01-02 NOTE — Telephone Encounter (Signed)
Sent. Thanks.   

## 2018-01-02 NOTE — Telephone Encounter (Signed)
Faxed refill request. Tramadol Last office visit:   09/09/2017 Last Filled:    405 tablet 1 07/03/2017  Please advise.

## 2018-01-04 NOTE — Progress Notes (Signed)
Cardiology Office Note Date:  01/08/2018  Patient ID:  Vanessa [name], DOB 1934-08-13, MRN [id number] PCP:  Joaquim Namuncan, Graham S, MD  Cardiologist:  Dr. Mariah MillingGollan, MD Electrophysiologist: Dr. Graciela HusbandsKlein, MD    Chief Complaint: Follow up  History of Present Illness: Vanessa [name] is a 83 y.o. female with history of reported PAF previously on Eliquis, atrial tachycardia/SVT with SSS, chronic systolic CHF of uncertain etiology, pulmonary hypertension, PAD, COPD, with ongoing tobacco abuse, HLD with statin intolerance, hypothyroidism, and migraine disorder who presents for follow up of her arrhythmia.   She apparently had an episode of Afib following a colonoscopy in 2012. EKGs since then have demonstrated sinus bradycardia.   Echo from 2012 showed an EF of 45-50%, mild diffuse HK, trivial AI, mild MR, mildly dilated RV with mild reduction of RVSF, mild to moderate TR, mild to moderate PR, mildly elevated PASP. No prior ischemic evaluations. Patient was seen in 12/2016 for follow up and noted continued positional dizziness. She was drinking mainly Coke and eating sparingly during the day. She was noted to have a BP of 122/62, HR 52 bpm with a noted heart rate that typically runs in the 50s bpm. It was uncertain if her dizziness was related to PAF at that time. Outpatient monitoring was considered. She was noted to also have been having some mild weight loss. Chest CT on 5/7 without clear cause for weight loss. There was some concern depression. Patient was seen by PCP on 5/20 for ongoing weight loss (had lost 9 pounds between cardiology visit in 12/2016 to PCP visit on 09/09/2017). There was concern she was in Afib with RVR with heart rate in the 120s bpm. She was sent to the [facility] ED. BP stable. In the ED, the patient would briefly convert to sinus on her own, followed by redevelopment of narrow complex tachycardia. Labs were unrevealing. CXR showed COPD. She was started on Cardizem CD 120 mg daily and advised  to follow up as an outpatient.In ED follow up on 6/4, she continued to note intermittent, short-lived dizziness. She continued to have a poor PO intake following the death of her husband. She was started on Eliquis for concerns of Afib. Echo on 10/10/17 showed a stable, slightly reduced EF of 45-50%, Gr2DD, mild MR, RVSF normal, PASP 42 mmHg. Zio monitor showed NSR with an average heart rate of 63 bpm. There were 7,793 runs of SVT/atrial tach with the fastest run lasting 3 minutes 43 seconds with a max rate of 171 bpm and the longest run lasting 18 min 35 seconds.  Only a few of the patient triggered events corresponded to these runs, with most episodes the patient was asymptomatic. Minimum heart rate of 35 bpm at 1:33 AM on 6/10. She was seen on 7/23 by EP with recommendation to stop Eliquis and diltiazem. She was placed on flecainide. EP felt this was more of an atrial tach rather than Afib. In follow up with EP on 12/05/17, she was feeling better. Tachycardic rates persisted. Lopressor 12.5 mg bid was added.   She comes in accompanied by her daughter and son today. She continues to note intermittent dizziness, fatigue, and lethargy that is unchanged. She is also noting bilateral leg pain that can be associated with exertion or rest. She continues to smoke and is not interested in quitting. Heart rates at home have typically been in the 30s to low 50s bpm with an isolated HR of 95-96 bpm. BP has ranged from the 90s to  110s systolic. She has been compliant with flecainide and metoprolol. She did have one near-falling episode several days prior that led to an abrasion on the left knee. No syncope. No chest pain.   Past Medical History:  Diagnosis Date  . Anemia    treated ~2009 with iron, resolved  . Arthritis   . Cataract    surgery on R catarct 2012  . Chronic systolic CHF (congestive heart failure) (HCC)    a. TTE 2012: EF of 45-50%, mild diffuse HK, trivial AI, mild MR, mildly dilated RV with mild  reduction of RVSF, mild to moderate TR, mild to moderate PR, mildly elevated PASP  . COPD (chronic obstructive pulmonary disease) (HCC)   . Family history of adverse reaction to anesthesia    Mother - altered mental status (long term)  . Glaucoma   . Hyperglycemia    mild inc in fasting sugar  . Hyperlipidemia   . Hypothyroidism   . Insomnia   . Migraines    without aura, sx since age 11  . Osteopenia    prev with 10 years of fosamax and intolerant of evista  . PAF (paroxysmal atrial fibrillation) (HCC)    a. initially noted 2012; b. CHADS2VASc at least 5 (CHF, age x 2, vasacular disease, female)  . Pulmonary hypertension (HCC)   . Wears dentures    full upper and lower    Past Surgical History:  Procedure Laterality Date  . CATARACT EXTRACTION    . CATARACT EXTRACTION W/PHACO Left 08/27/2016   Procedure: CATARACT EXTRACTION PHACO AND INTRAOCULAR LENS PLACEMENT (IOC)  Left;  Surgeon: Lockie Mola, MD;  Location: [facility];  Service: Ophthalmology;  Laterality: Left;  . COLONOSCOPY  2012  . REFRACTIVE SURGERY      Current Meds  Medication Sig  . aspirin-acetaminophen-caffeine (EXCEDRIN MIGRAINE) 250-250-65 MG per tablet Take 1 tablet by mouth every 6 (six) hours as needed.    . fish oil-omega-3 fatty acids 1000 MG capsule Take 1 g by mouth daily.    . flecainide (TAMBOCOR) 50 MG tablet Take 1 tablet (50 mg total) by mouth 2 (two) times daily.  Marland Kitchen latanoprost (XALATAN) 0.005 % ophthalmic solution Place 1 drop into both eyes at bedtime.  Marland Kitchen levothyroxine (SYNTHROID, LEVOTHROID) 50 MCG tablet Take 1 tablet (50 mcg total) by mouth daily.  . Multiple Vitamin (MULTIVITAMIN) tablet Take 1 tablet by mouth daily.  . traMADol (ULTRAM) 50 MG tablet TAKE ONE AND ONE-HALF TABLETS THREE TIMES A DAY  . [DISCONTINUED] metoprolol tartrate (LOPRESSOR) 25 MG tablet Take 0.5 tablets (12.5 mg total) by mouth 2 (two) times daily.    Allergies:   Codeine; Cortisone; Decongestant  [pseudoephedrine hcl]; Naproxen; and Simvastatin   Social History:  The patient  reports that she has been smoking cigarettes. She has a 12.50 pack-year smoking history. She has never used smokeless tobacco. She reports that she does not drink alcohol or use drugs.   Family History:  The patient's family history includes Diabetes in her father; Migraines in her mother; Stroke in her mother.  ROS:   Review of Systems  Constitutional: Positive for malaise/fatigue. Negative for chills, diaphoresis, fever and weight loss.  HENT: Negative for congestion.   Eyes: Negative for discharge and redness.  Respiratory: Negative for cough, hemoptysis, sputum production, shortness of breath and wheezing.   Cardiovascular: Positive for claudication. Negative for chest pain, palpitations, orthopnea, leg swelling and PND.  Gastrointestinal: Negative for abdominal pain, blood in stool, heartburn, melena, nausea and  vomiting.  Genitourinary: Negative for hematuria.  Musculoskeletal: Negative for falls and myalgias.       Near fall  Skin: Negative for rash.  Neurological: Positive for dizziness and weakness. Negative for tingling, tremors, sensory change, speech change, focal weakness and loss of consciousness.  Endo/Heme/Allergies: Does not bruise/bleed easily.  Psychiatric/Behavioral: Negative for substance abuse. The patient is not nervous/anxious.      PHYSICAL EXAM:  VS:  BP 140/70 (BP Location: Left Arm, Patient Position: Sitting, Cuff Size: Normal)   Pulse (!) 45   Ht 5\' 2"  (1.575 m)   Wt 97 lb 8 oz (44.2 kg)   BMI 17.83 kg/m  BMI: Body mass index is 17.83 kg/m.  Physical Exam  Constitutional: She is oriented to person, place, and time. She appears well-developed and well-nourished.  HENT:  Head: Normocephalic and atraumatic.  Eyes: Right eye exhibits no discharge. Left eye exhibits no discharge.  Neck: Normal range of motion. No JVD present.  Cardiovascular: Regular rhythm, S1 normal and S2  normal. Bradycardia present. Exam reveals no distant heart sounds, no friction rub, no midsystolic click and no opening snap.  Murmur heard. High-pitched blowing holosystolic murmur is present with a grade of 1/6 at the apex. Pulses:      Dorsalis pedis pulses are 2+ on the right side, and 1+ on the left side.       Posterior tibial pulses are 2+ on the right side, and 1+ on the left side.  Excoriation noted on the anterior surface of the left patella   Pulmonary/Chest: Effort normal and breath sounds normal. No respiratory distress. She has no decreased breath sounds. She has no wheezes. She has no rales. She exhibits no tenderness.  Abdominal: Soft. She exhibits no distension. There is no tenderness.  Musculoskeletal: She exhibits no edema.  Neurological: She is alert and oriented to person, place, and time.  Skin: Skin is warm and dry. No cyanosis. Nails show no clubbing.  Psychiatric: She has a normal mood and affect. Her speech is normal and behavior is normal. Judgment and thought content normal.     EKG:  Was ordered and interpreted by me today. Shows Marked sinus bradycardia, 45 bpm, left axis deviation, poor R wave progression through the precordial leads, no acute st/t changes   Recent Labs: 09/09/2017: ALT 13; BUN 12; Creatinine, Ser 0.84; Hemoglobin 14.6; Magnesium 2.1; Platelets 234; Potassium 4.1; Sodium 137; TSH 1.820  07/19/2017: Cholesterol 197; HDL 61.10; LDL Cholesterol 122; Total CHOL/HDL Ratio 3; Triglycerides 73.0; VLDL 14.6   CrCl cannot be calculated (Patient's most recent lab result is older than the maximum 21 days allowed.).   Wt Readings from Last 3 Encounters:  01/08/18 97 lb 8 oz (44.2 kg)  12/05/17 95 lb (43.1 kg)  11/12/17 95 lb 4 oz (43.2 kg)     Other studies reviewed: Additional studies/records reviewed today include: summarized above  ASSESSMENT AND PLAN:  1. SVT/atrial tach/tachy-brady syndrome/dizziness: Heart rates continue to be difficult to  adequately control. It remains uncertain if her SVT burden has been improved with the addition of flecainide; however, we are running into issues with bradycardic rates into the 30s bpm with the addition of metoprolol. We have agreed to stop metoprolol. She is being referred to [place] to be evaluated by EP there for possible SVT ablation. My hope is that with this, we can avoid the need for a PPM for tachy-brady syndrome. I suspect a lot of her dizziness, fatigue, and lethargy is stemming from  her heart rates and with improvement in the SVT we can successfully back off the flecainide as well. I do not think she will tolerate continuation of metoprolol or addition of diltiazem given her underlying bradycardia. Long discussion with the patient and family with all being in agreement to proceed with EP evaluation is [place].  2. Reported Afib: Has been taken off DOAC by EP. Felt less likely to be Afib. Remains on flecainide as above per EP.   3. HFrEF/diastolic dysfunction: She does not appear grossly volume up. Would prefer to discontinue flecainide when able given her mild cardiomyopathy and smoking history.   4. Leg pain: Mixed symptoms for claudication, though at increased risk for PAD given her ongoing tobacco abuse. Check lower extremity ultrasound and ABIs. If these are normal, recommend she follow up with her PCP. Check bmet and magnesium. Handicap form completed.   Disposition: F/u with Dr. Mariah Milling or an APP in 2-3 months, sooner if needed.   Current medicines are reviewed at length with the patient today.  The patient did not have any concerns regarding medicines.  Signed, Eula Listen, PA-C 01/08/2018 2:25 PM     [facility] [place] [address] [place], [place] [address] [phone]

## 2018-01-08 ENCOUNTER — Ambulatory Visit: Payer: PPO | Admitting: Physician Assistant

## 2018-01-08 ENCOUNTER — Encounter: Payer: Self-pay | Admitting: Physician Assistant

## 2018-01-08 VITALS — BP 140/70 | HR 45 | Ht 62.0 in | Wt 97.5 lb

## 2018-01-08 DIAGNOSIS — M79605 Pain in left leg: Secondary | ICD-10-CM

## 2018-01-08 DIAGNOSIS — I4891 Unspecified atrial fibrillation: Secondary | ICD-10-CM | POA: Diagnosis not present

## 2018-01-08 DIAGNOSIS — I5022 Chronic systolic (congestive) heart failure: Secondary | ICD-10-CM | POA: Diagnosis not present

## 2018-01-08 DIAGNOSIS — I495 Sick sinus syndrome: Secondary | ICD-10-CM

## 2018-01-08 DIAGNOSIS — R42 Dizziness and giddiness: Secondary | ICD-10-CM | POA: Diagnosis not present

## 2018-01-08 DIAGNOSIS — I471 Supraventricular tachycardia: Secondary | ICD-10-CM | POA: Diagnosis not present

## 2018-01-08 DIAGNOSIS — R5382 Chronic fatigue, unspecified: Secondary | ICD-10-CM

## 2018-01-08 DIAGNOSIS — M79604 Pain in right leg: Secondary | ICD-10-CM | POA: Diagnosis not present

## 2018-01-08 NOTE — Patient Instructions (Addendum)
Medication Instructions:  Your physician has recommended you make the following change in your medication:  1- STOP Metoprolol.   Labwork: Your physician recommends that you return for lab work in: TODAY-BMET, Mg.   Testing/Procedures: Your physician has requested that you have a lower extremity arterial doppler- During this test, ultrasound is used to evaluate arterial blood flow in the legs. Allow approximately one hour for this exam.   Your physician has requested that you have an ankle brachial index (ABI). During this test an ultrasound and blood pressure cuff are used to evaluate the arteries that supply the arms and legs with blood. Allow thirty minutes for this exam. There are no restrictions or special instructions.    Follow-Up: You have been referred to Electrophysiology for SVT Ablation evaluation at our [facility].  Your physician recommends that you schedule a follow-up appointment in: 2-3 MONTHS WITH DR Nolon BussingGOLLAN OR RYAN AFTER YOU HAVE SEEN EP DOCTOR.    If you need a refill on your cardiac medications before your next appointment, please call your pharmacy.   Ankle-Brachial Index Test The ankle-brachial index (ABI) test is used to find peripheral vascular disease (PVD). PVD is also known as peripheral arterial disease (PAD). PVD is the blocking or hardening of the arteries anywhere within the circulatory system beyond the heart. PVD is caused by cholesterol deposits in your blood vessels (atherosclerosis). These deposits cause arteries to narrow. The delivery of oxygen to your tissues is impaired as a result. This can cause muscle pain and fatigue. This is called claudication. PVD means there may also be buildup of cholesterol in your:  Heart. This increases the risk of heart attacks.  Brain. This increases the risk of strokes.  The ankle-brachial index test measures the blood flow in your arms and legs. This test also determines if blood vessels in your leg are  narrowed by cholesterol deposits. There are additional causes of a reduced ankle-brachial index, such as inflammation of vessels or a clot in the vessels. However, these are much less common than narrowing due to cholesterol deposits. What is being tested? The test is done while you are lying down and resting. Measurements are taken of the systolic pressure:  In your arm (brachial).  In your ankle at several points along your leg.  Systolic pressure is the pressure inside your arteries when your heart pumps. The measurements are taken several times on both sides. Then, the highest systolic pressure of the ankle is divided by the highest brachial systolic pressure. The result is the ankle-brachial pressure ratio, or ABI. Sometimes this test is repeated after you have exercised on a treadmill for five minutes. You may have leg pain during the exercise portion of the test if you suffer from PAD. If the index number drops after exercise, this may show that PAD is present. A normal ABI ratio is between 0.9 and 1.4. A value below 0.9 is considered abnormal. This information is not intended to replace advice given to you by your health care provider. Make sure you discuss any questions you have with your health care provider. Document Released: 04/13/2004 Document Revised: 09/15/2015 Document Reviewed: 11/13/2013 Elsevier Interactive Patient Education  [employer]2018 Elsevier Inc.

## 2018-01-09 LAB — BASIC METABOLIC PANEL
BUN/Creatinine Ratio: 16 (ref 12–28)
BUN: 14 mg/dL (ref 8–27)
CALCIUM: 9.3 mg/dL (ref 8.7–10.3)
CO2: 21 mmol/L (ref 20–29)
Chloride: 96 mmol/L (ref 96–106)
Creatinine, Ser: 0.9 mg/dL (ref 0.57–1.00)
GFR, EST AFRICAN AMERICAN: 69 mL/min/{1.73_m2} (ref >59)
GFR, EST NON AFRICAN AMERICAN: 60 mL/min/{1.73_m2} (ref >59)
Glucose: 88 mg/dL (ref 65–99)
POTASSIUM: 4.7 mmol/L (ref 3.5–5.2)
SODIUM: 134 mmol/L (ref 134–144)

## 2018-01-09 LAB — MAGNESIUM: Magnesium: 2.2 mg/dL (ref 1.6–2.3)

## 2018-01-20 ENCOUNTER — Ambulatory Visit (INDEPENDENT_AMBULATORY_CARE_PROVIDER_SITE_OTHER): Payer: PPO

## 2018-01-20 DIAGNOSIS — M79604 Pain in right leg: Secondary | ICD-10-CM

## 2018-01-20 DIAGNOSIS — M79605 Pain in left leg: Secondary | ICD-10-CM

## 2018-01-29 ENCOUNTER — Ambulatory Visit: Payer: PPO | Admitting: Cardiology

## 2018-01-29 ENCOUNTER — Encounter: Payer: Self-pay | Admitting: Cardiology

## 2018-01-29 VITALS — BP 142/82 | HR 110 | Ht 62.0 in | Wt 95.0 lb

## 2018-01-29 DIAGNOSIS — I5022 Chronic systolic (congestive) heart failure: Secondary | ICD-10-CM | POA: Diagnosis not present

## 2018-01-29 DIAGNOSIS — I471 Supraventricular tachycardia: Secondary | ICD-10-CM

## 2018-01-29 MED ORDER — FLECAINIDE ACETATE 100 MG PO TABS: 100.0000 mg | ORAL_TABLET | Freq: Two times a day (BID) | ORAL | 1 refills | Status: DC

## 2018-01-29 NOTE — Progress Notes (Signed)
Electrophysiology Office Note   Date:  01/29/2018   ID:  Vanessa [name], Vanessa [name] 12-Feb-1935, MRN [id number]  PCP:  Joaquim Nam, MD  Cardiologist:  Graciela Husbands Primary Electrophysiologist:  Dot Splinter Jorja Loa, MD    No chief complaint on file.    History of Present Illness: Vanessa [name] is a 83 y.o. female who is being seen today for the evaluation of SVT at the request of Eula Listen. Presenting today for electrophysiology evaluation.  She has a history of paroxysmal atrial fibrillation previously on Eliquis, atrial tachycardia/SVT with sick sinus syndrome, chronic systolic heart failure, pulmonary hypertension, PAD, COPD, tobacco abuse, hyperlipidemia, statin intolerance, hypothyroidism.  She had an episode of atrial fibrillation in 2012 after colonoscopy.  Echo from 2012 showed an EF of 45 to 50%.  She has been having palpitations for some time.  She wore a ZIO patch that showed multiple runs of SVT/atrial tachycardia, the longest lasting 3 minutes and 43 seconds with a heart rate of 171 bpm.  Only a few of the patient triggered events corresponded with these runs, most were asymptomatic.  She was seen on 7/23 with recommendations to stop Eliquis and diltiazem.  She was placed on flecainide due to her atrial tachycardia.  She was subsequently started on metoprolol due to minor palpitations.  Metoprolol was since stopped due to resting bradycardia and fatigue.  She does have episodic dizziness.  She says that the dizziness occurs mainly when she changes position going from a seated or laying to standing position.  It happens at no other time.  She also feels palpitations mainly in the morning on the right side of her chest.  When she gets up and starts moving about during the day, her palpitations improve.  Per her daughters, she is not good at taking her medications at the right time.  They quite often find medicines not having been taken by early to mid afternoon from the morning.  Today, she  denies symptoms of chest pain, shortness of breath, orthopnea, PND, lower extremity edema, claudication, presyncope, syncope, bleeding, or neurologic sequela. The patient is tolerating medications without difficulties.    Past Medical History:  Diagnosis Date  . Anemia    treated ~2009 with iron, resolved  . Arthritis   . Cataract    surgery on R catarct 2012  . Chronic systolic CHF (congestive heart failure) (HCC)    a. TTE 2012: EF of 45-50%, mild diffuse HK, trivial AI, mild MR, mildly dilated RV with mild reduction of RVSF, mild to moderate TR, mild to moderate PR, mildly elevated PASP  . COPD (chronic obstructive pulmonary disease) (HCC)   . Family history of adverse reaction to anesthesia    Mother - altered mental status (long term)  . Glaucoma   . Hyperglycemia    mild inc in fasting sugar  . Hyperlipidemia   . Hypothyroidism   . Insomnia   . Migraines    without aura, sx since age 11  . Osteopenia    prev with 10 years of fosamax and intolerant of evista  . PAF (paroxysmal atrial fibrillation) (HCC)    a. initially noted 2012; b. CHADS2VASc at least 5 (CHF, age x 2, vasacular disease, female)  . Pulmonary hypertension (HCC)   . Wears dentures    full upper and lower   Past Surgical History:  Procedure Laterality Date  . CATARACT EXTRACTION    . CATARACT EXTRACTION W/PHACO Left 08/27/2016   Procedure: CATARACT EXTRACTION PHACO AND  INTRAOCULAR LENS PLACEMENT (IOC)  Left;  Surgeon: Lockie Mola, MD;  Location: [facility];  Service: Ophthalmology;  Laterality: Left;  . COLONOSCOPY  2012  . REFRACTIVE SURGERY       Current Outpatient Medications  Medication Sig Dispense Refill  . aspirin-acetaminophen-caffeine (EXCEDRIN MIGRAINE) 250-250-65 MG per tablet Take 1 tablet by mouth every 6 (six) hours as needed.      . fish oil-omega-3 fatty acids 1000 MG capsule Take 1 g by mouth daily.      . flecainide (TAMBOCOR) 50 MG tablet Take 1 tablet (50 mg total)  by mouth 2 (two) times daily. 60 tablet 6  . latanoprost (XALATAN) 0.005 % ophthalmic solution Place 1 drop into both eyes at bedtime.    Marland Kitchen levothyroxine (SYNTHROID, LEVOTHROID) 50 MCG tablet Take 1 tablet (50 mcg total) by mouth daily. 90 tablet 3  . Multiple Vitamin (MULTIVITAMIN) tablet Take 1 tablet by mouth daily.    . traMADol (ULTRAM) 50 MG tablet TAKE ONE AND ONE-HALF TABLETS THREE TIMES A DAY 405 tablet 1   No current facility-administered medications for this visit.     Allergies:   Codeine; Cortisone; Decongestant [pseudoephedrine hcl]; Naproxen; and Simvastatin   Social History:  The patient  reports that she has been smoking cigarettes. She has a 12.50 pack-year smoking history. She has never used smokeless tobacco. She reports that she does not drink alcohol or use drugs.   Family History:  The patient's family history includes Diabetes in her father; Migraines in her mother; Stroke in her mother.    ROS:  Please see the history of present illness.   Otherwise, review of systems is positive for, palpitations, dizziness, balance problems, headaches, easy bruising.   All other systems are reviewed and negative.    PHYSICAL EXAM: VS:  BP (!) 142/82   Pulse (!) 110   Ht 5\' 2"  (1.575 m)   Wt 95 lb (43.1 kg)   SpO2 96%   BMI 17.38 kg/m  , BMI Body mass index is 17.38 kg/m. GEN: Well nourished, well developed, in no acute distress  HEENT: normal  Neck: no JVD, carotid bruits, or masses Cardiac: RRR; no murmurs, rubs, or gallops,no edema  Respiratory:  clear to auscultation bilaterally, normal work of breathing GI: soft, nontender, nondistended, + BS MS: no deformity or atrophy  Skin: warm and dry, Neuro:  Strength and sensation are intact Psych: euthymic mood, full affect  EKG:  EKG is ordered today. Personal review of the ekg ordered shows sinus arrhythmia, rate 62  Recent Labs: 09/09/2017: ALT 13; Hemoglobin 14.6; Platelets 234; TSH 1.820 01/08/2018: BUN 14;  Creatinine, Ser 0.90; Magnesium 2.2; Potassium 4.7; Sodium 134    Lipid Panel     Component Value Date/Time   CHOL 197 07/19/2017 1209   TRIG 73.0 07/19/2017 1209   HDL 61.10 07/19/2017 1209   CHOLHDL 3 07/19/2017 1209   VLDL 14.6 07/19/2017 1209   LDLCALC 122 (H) 07/19/2017 1209     Wt Readings from Last 3 Encounters:  01/29/18 95 lb (43.1 kg)  01/08/18 97 lb 8 oz (44.2 kg)  12/05/17 95 lb (43.1 kg)      Other studies Reviewed: Additional studies/ records that were reviewed today include: TTE 10/11/17  Review of the above records today demonstrates:  - Left ventricle: The cavity size was normal. Systolic function was   mildly reduced. The estimated ejection fraction was in the range   of 45% to 50%. Regional wall motion abnormalities  cannot be   excluded. Features are consistent with a pseudonormal left   ventricular filling pattern, with concomitant abnormal relaxation   and increased filling pressure (grade 2 diastolic dysfunction). - Mitral valve: There was mild regurgitation. - Right ventricle: Systolic function was normal. - Tricuspid valve: There was moderate regurgitation. - Pulmonary arteries: Systolic pressure was mildly elevated PA peak   pressure: 42 mm Hg (S).  30 day monitor 11/09/17 - personally reviewed Normal sinus rhythm Avg HR of 63 bpm. 793 Supraventricular Tachycardia/Atrial tachycardia runs occurred,  Run with the fastest interval lasting 3 mins 43 secs with a max rate of 171 bpm,  longest lasting 18 mins 35 secs with an avg rate of 144 bpm.    ASSESSMENT AND PLAN:  1.  SVT/atrial tachycardia: She has been on flecainide which has somewhat improved her symptoms.  That being said, I do feel that she is somewhat downplays her symptoms.  She is having some episodes of palpitations which are minorly bothersome.  I Timofey Carandang thus increase her flecainide dose.  I did talk to her about the possibility of ablation, though with her small stature and advanced age,  she would be at a little bit higher risk.  That being said she does not wish to have an ablation performed.  I Kavya Haag have her follow-up with an EKG in 1 week and then see Dr. Graciela Husbands in the [place] office thereafter.  2.  Atrial fibrillation: Found years ago.  Currently on flecainide and not on anticoagulation as she has not had recent episodes.  Her episode occurred after colonoscopy.  3.  Systolic heart failure, chronic: No signs of volume overload.  No changes.    Current medicines are reviewed at length with the patient today.   The patient does not have concerns regarding her medicines.  The following changes were made today:  none  Labs/ tests ordered today include:  No orders of the defined types were placed in this encounter.  Case discussed with primary electrophysiologist  Disposition:   FU with Graciela Husbands 2 months  Signed, Jazmeen Axtell Jorja Loa, MD  01/29/2018 3:16 PM     [facility] [address] [place] [address] [phone] (office) [phone] (fax)

## 2018-01-29 NOTE — Patient Instructions (Signed)
Medication Instructions:  Your physician has recommended you make the following change in your medication:  1. INCREASE Flecainide to 100 mg twice daily  If you need a refill on your cardiac medications before your next appointment, please call your pharmacy.   Lab work: None ordered  Testing/Procedures: None ordered  Follow-Up: At [facility], you and your health needs are our priority.  As part of our continuing mission to provide you with exceptional heart care, we have created designated Provider Care Teams.  These Care Teams include your primary Cardiologist (physician) and Advanced Practice Providers (APPs -  Physician Assistants and Nurse Practitioners) who all work together to provide you with the care you need, when you need it. . You will need an EKG nurse visit in 1 week in [place] . You will need a follow up appointment in 2 months in [place] with Dr. Graciela Husbands.  Thank you for choosing [facility]!!   Dory Horn, RN [phone]

## 2018-01-31 NOTE — Addendum Note (Signed)
Addended by: Micki Riley C on: 01/31/2018 07:52 AM   Modules accepted: Orders

## 2018-02-06 ENCOUNTER — Ambulatory Visit (INDEPENDENT_AMBULATORY_CARE_PROVIDER_SITE_OTHER): Payer: PPO | Admitting: *Deleted

## 2018-02-06 VITALS — BP 126/73 | HR 60 | Resp 19 | Ht 62.0 in | Wt 95.2 lb

## 2018-02-06 DIAGNOSIS — I471 Supraventricular tachycardia: Secondary | ICD-10-CM | POA: Diagnosis not present

## 2018-02-06 NOTE — Addendum Note (Signed)
Addended by: Bryna Colander on: 02/06/2018 12:36 PM   Modules accepted: Orders

## 2018-02-06 NOTE — Patient Instructions (Addendum)
Please keep your upcoming appointments:  Dr. Graciela Husbands  03/27/18 at 10:30AM  Dr. Mariah Milling 04/08/18 at 09:20AM  It was a pleasure seeing you today here in the office. Please do not hesitate to give [place] a call back if you have any further questions. [phone]  Verona Cellar RN, BSN

## 2018-02-06 NOTE — Progress Notes (Signed)
1.) Reason for visit: EKG  2.) Name of MD requesting visit: Dr. Elberta Fortis  3.) H&P: History of SVT/Atrial tachycardia and afib  4.) ROS related to problem: Patient states that she feels good with some dizziness at times. No other complaints at this time.  5.) Assessment and plan per MD: 2 EKGs showed sinus bradycardia and normal sinus rhythm. Dr. Graciela Husbands reviewed each of them and reviewed with patient to please keep her scheduled appointments.

## 2018-03-07 DIAGNOSIS — H401131 Primary open-angle glaucoma, bilateral, mild stage: Secondary | ICD-10-CM | POA: Diagnosis not present

## 2018-03-27 ENCOUNTER — Encounter: Payer: Self-pay | Admitting: Internal Medicine

## 2018-03-27 ENCOUNTER — Ambulatory Visit (INDEPENDENT_AMBULATORY_CARE_PROVIDER_SITE_OTHER): Payer: PPO | Admitting: Internal Medicine

## 2018-03-27 VITALS — BP 128/70 | HR 68 | Ht 62.0 in | Wt 96.0 lb

## 2018-03-27 DIAGNOSIS — R001 Bradycardia, unspecified: Secondary | ICD-10-CM

## 2018-03-27 DIAGNOSIS — I471 Supraventricular tachycardia: Secondary | ICD-10-CM | POA: Diagnosis not present

## 2018-03-27 NOTE — Progress Notes (Signed)
Patient Care Team: Joaquim Nam, MD as PCP - General (Family Medicine) Lockie Mola, MD as Referring Physician (Ophthalmology) Antonieta Iba, MD as Consulting Physician (Cardiology)   HPI  Vanessa [name] is a 83 y.o. female  Seen in follow-up for tachycardia.  I saw her 11/12/2017 and failed to write a note.  She had been seen earlier that day by RD-PA.  She had been noted to have "atrial fibrillation "and some degree of bradycardia.  Heart rates in AF were in the 120s and in sinus rhythm were in the 50s.  His zio monitor had demonstrated recurrent episodes of tachycardia up to 170.  We ended up discontinuing her Cardizem and beginning her on flecainide review of the strips suggested that it was more of an atrial tachycardia is in atrial fibrillation and we discontinued her Eliquis  She continues to have problems with palpitations and dizziness.  She was seen by Dr. Elberta Fortis who discussed with her ablation; she declined.    DATE PR interval QRSduration Dose  7/19  148 78 0  8/19 168 82 50 F  12/19 200 90 100   She has had scant palpitations.  No issues of tolerating her flecainide.  No chest pains or shortness of breath.  Some leg pains.  Concerns about balance and falls  Records and Results Reviewed   Past Medical History:  Diagnosis Date  . Anemia    treated ~2009 with iron, resolved  . Arthritis   . Cataract    surgery on R catarct 2012  . Chronic systolic CHF (congestive heart failure) (HCC)    a. TTE 2012: EF of 45-50%, mild diffuse HK, trivial AI, mild MR, mildly dilated RV with mild reduction of RVSF, mild to moderate TR, mild to moderate PR, mildly elevated PASP  . COPD (chronic obstructive pulmonary disease) (HCC)   . Family history of adverse reaction to anesthesia    Mother - altered mental status (long term)  . Glaucoma   . Hyperglycemia    mild inc in fasting sugar  . Hyperlipidemia   . Hypothyroidism   . Insomnia   . Migraines     without aura, sx since age 36  . Osteopenia    prev with 10 years of fosamax and intolerant of evista  . PAF (paroxysmal atrial fibrillation) (HCC)    a. initially noted 2012; b. CHADS2VASc at least 5 (CHF, age x 2, vasacular disease, female)  . Pulmonary hypertension (HCC)   . Wears dentures    full upper and lower    Past Surgical History:  Procedure Laterality Date  . CATARACT EXTRACTION    . CATARACT EXTRACTION W/PHACO Left 08/27/2016   Procedure: CATARACT EXTRACTION PHACO AND INTRAOCULAR LENS PLACEMENT (IOC)  Left;  Surgeon: Lockie Mola, MD;  [facility];  Service: Ophthalmology;  Laterality: Left;  . COLONOSCOPY  2012  . REFRACTIVE SURGERY      Current Meds  Medication Sig  . aspirin-acetaminophen-caffeine (EXCEDRIN MIGRAINE) 250-250-65 MG per tablet Take 1 tablet by mouth every 6 (six) hours as needed.    . fish oil-omega-3 fatty acids 1000 MG capsule Take 1 g by mouth daily.    . flecainide (TAMBOCOR) 100 MG tablet Take 1 tablet (100 mg total) by mouth 2 (two) times daily.  Marland Kitchen latanoprost (XALATAN) 0.005 % ophthalmic solution Place 1 drop into both eyes at bedtime.  Marland Kitchen levothyroxine (SYNTHROID, LEVOTHROID) 50 MCG tablet Take 1 tablet (50 mcg total) by  mouth daily.  . Multiple Vitamin (MULTIVITAMIN) tablet Take 1 tablet by mouth daily.  . traMADol (ULTRAM) 50 MG tablet TAKE ONE AND ONE-HALF TABLETS THREE TIMES A DAY    Allergies  Allergen Reactions  . Codeine     Doesn't remember reaction  . Cortisone     Doesn't remember reaction  . Decongestant [Pseudoephedrine Hcl] Other (See Comments)    Avoid decongestants due to glaucoma hx.    . Naproxen     Stomach issues  . Simvastatin     aches      Review of Systems negative except from HPI and PMH  Physical Exam BP 128/70 (BP Location: Left Arm, Patient Position: Sitting, Cuff Size: Normal)   Pulse 68   Ht 5\' 2"  (1.575 m)   Wt 96 lb (43.5 kg)   BMI 17.56 kg/m  Well developed and  nourished in no acute distress HENT normal Neck supple with JVP-flat Clear Regular rate and rhythm, no murmurs or gallops Abd-soft with active BS No Clubbing cyanosis edema Skin-warm and dry A & Oriented  Grossly normal sensory and motor function  ECG demonstrates sinus rhythm at 68 Intervals 20/09/44   Assessment and  Plan  Atrial tach  Sinus brady  Dizziness-chronic  Balance concerns   Atrial tachycardia seems largely suppressed by the flecai morning nide.  However her PR interval has lengthened by > 25 and her QRS by about 20 ms.  We will decrease her from total of 200 mg a day--150 mg a day.  Initially she will take 50 every 8 until the new prescription of 75 twice daily.  We will need to recheck her electrocardiogram in about 3 weeks.  We also addressed the concern between family's need for security and patient's need for independence.  Suggested that they consider reading Being Mortal    We spent more than 50% of our >25 min visit in face to face counseling regarding the above

## 2018-03-31 ENCOUNTER — Other Ambulatory Visit: Payer: Self-pay

## 2018-03-31 NOTE — Telephone Encounter (Signed)
Pt daughter states pt Flecainide doseage was going to change. Please call to discuss.

## 2018-04-01 ENCOUNTER — Telehealth: Payer: Self-pay | Admitting: Internal Medicine

## 2018-04-01 MED ORDER — FLECAINIDE ACETATE 50 MG PO TABS: 75.0000 mg | ORAL_TABLET | Freq: Two times a day (BID) | ORAL | 11 refills | Status: DC

## 2018-04-01 NOTE — Telephone Encounter (Signed)
I called and spoke with the patient's daughter, Tresa EndoKelly. I advised Dr. Graciela HusbandsKlein had gotten a message to me stating that the patient needed to decrease her flecainide dose down to 75 mg BID.  Tresa EndoKelly confirmed Dr. Graciela HusbandsKlein had called and notified her of this. I also advised that he would like the patient to come in to the office in about 3 weeks for a nurse visit EKG to look at this on the lower dose flecainide. Tresa EndoKelly is agreeable. I have advised I will send in a new RX to the pharmacy for her lower dose flecainide and will have scheduling call her to arrange for a nurse visit EKG in 3 weeks.

## 2018-04-01 NOTE — Telephone Encounter (Signed)
Addressed in phone encounter dated for today.

## 2018-04-08 ENCOUNTER — Ambulatory Visit: Payer: PPO | Admitting: Cardiovascular Disease

## 2018-04-22 ENCOUNTER — Ambulatory Visit (INDEPENDENT_AMBULATORY_CARE_PROVIDER_SITE_OTHER): Payer: PPO | Admitting: *Deleted

## 2018-04-22 VITALS — BP 146/85 | HR 68 | Ht 62.0 in | Wt 98.4 lb

## 2018-04-22 DIAGNOSIS — R001 Bradycardia, unspecified: Secondary | ICD-10-CM

## 2018-04-22 NOTE — Progress Notes (Signed)
1.) Reason for visit: The patient came in for an EKG following a decrease in Flecainide to 75 mg bid.  2.) Name of MD requesting visit: Dr. Graciela HusbandsKlein  3.) ROS related to problem: The patient is currently asymptomatic and stated that she has felt fine with the decrease in the flecainide.  4.) Assessment and plan per MD: The EKG showed sinus bradycardia with a heart rate of 57. Per DOD, no changes at this time. The patient will continue on the flecainide 75 mg bid. Note has been routed to Dr. Graciela HusbandsKlein for his knowledge.

## 2018-06-30 ENCOUNTER — Other Ambulatory Visit: Payer: Self-pay | Admitting: *Deleted

## 2018-06-30 NOTE — Telephone Encounter (Signed)
Electronic refill request. Levothyroxine Last office visit:   09/09/2017  Patient has CPE scheduled in March Last Filled:    90 tablet 3 07/03/2017    Electronic refill request. Tramadol Last office visit:   09/09/2017  CPE scheduled in March Last Filled:    405 tablet 1 01/02/2018   Please advise.

## 2018-07-01 MED ORDER — LEVOTHYROXINE SODIUM 50 MCG PO TABS: 50.0000 ug | ORAL_TABLET | Freq: Every day | ORAL | 3 refills | Status: DC

## 2018-07-01 MED ORDER — TRAMADOL HCL 50 MG PO TABS: ORAL_TABLET | ORAL | 1 refills | Status: DC

## 2018-07-01 NOTE — Telephone Encounter (Signed)
Sent. Thanks.   

## 2018-07-17 ENCOUNTER — Ambulatory Visit: Payer: PPO

## 2018-07-22 ENCOUNTER — Encounter: Payer: PPO | Admitting: Family Medicine

## 2018-09-30 ENCOUNTER — Telehealth: Payer: Self-pay | Admitting: Family Medicine

## 2018-09-30 ENCOUNTER — Emergency Department: Payer: PPO

## 2018-09-30 ENCOUNTER — Emergency Department
Admission: EM | Admit: 2018-09-30 | Discharge: 2018-09-30 | Disposition: A | Discharge status: Home / Self Care | Payer: PPO | Attending: Emergency Medicine | Admitting: Emergency Medicine

## 2018-09-30 ENCOUNTER — Encounter: Payer: Self-pay | Admitting: Emergency Medicine

## 2018-09-30 ENCOUNTER — Other Ambulatory Visit: Payer: Self-pay

## 2018-09-30 DIAGNOSIS — S0990XA Unspecified injury of head, initial encounter: Secondary | ICD-10-CM

## 2018-09-30 DIAGNOSIS — W01198A Fall on same level from slipping, tripping and stumbling with subsequent striking against other object, initial encounter: Secondary | ICD-10-CM | POA: Insufficient documentation

## 2018-09-30 DIAGNOSIS — Z79899 Other long term (current) drug therapy: Secondary | ICD-10-CM | POA: Diagnosis not present

## 2018-09-30 DIAGNOSIS — S0001XA Abrasion of scalp, initial encounter: Secondary | ICD-10-CM | POA: Diagnosis not present

## 2018-09-30 DIAGNOSIS — S32591A Other specified fracture of right pubis, initial encounter for closed fracture: Secondary | ICD-10-CM

## 2018-09-30 DIAGNOSIS — F17210 Nicotine dependence, cigarettes, uncomplicated: Secondary | ICD-10-CM | POA: Insufficient documentation

## 2018-09-30 DIAGNOSIS — S0081XA Abrasion of other part of head, initial encounter: Secondary | ICD-10-CM | POA: Diagnosis not present

## 2018-09-30 DIAGNOSIS — T148XXA Other injury of unspecified body region, initial encounter: Secondary | ICD-10-CM

## 2018-09-30 DIAGNOSIS — E039 Hypothyroidism, unspecified: Secondary | ICD-10-CM | POA: Diagnosis not present

## 2018-09-30 DIAGNOSIS — Y929 Unspecified place or not applicable: Secondary | ICD-10-CM | POA: Insufficient documentation

## 2018-09-30 DIAGNOSIS — Y9389 Activity, other specified: Secondary | ICD-10-CM | POA: Insufficient documentation

## 2018-09-30 DIAGNOSIS — S32501A Unspecified fracture of right pubis, initial encounter for closed fracture: Secondary | ICD-10-CM | POA: Diagnosis not present

## 2018-09-30 DIAGNOSIS — Y998 Other external cause status: Secondary | ICD-10-CM | POA: Insufficient documentation

## 2018-09-30 DIAGNOSIS — M25551 Pain in right hip: Secondary | ICD-10-CM | POA: Diagnosis not present

## 2018-09-30 DIAGNOSIS — S3992XA Unspecified injury of lower back, initial encounter: Secondary | ICD-10-CM | POA: Diagnosis not present

## 2018-09-30 LAB — BASIC METABOLIC PANEL
Anion gap: 10 (ref 5–15)
BUN: 16 mg/dL (ref 8–23)
CO2: 27 mmol/L (ref 22–32)
Calcium: 9.1 mg/dL (ref 8.9–10.3)
Chloride: 99 mmol/L (ref 98–111)
Creatinine, Ser: 0.79 mg/dL (ref 0.44–1.00)
GFR calc Af Amer: >60 mL/min (ref >60)
GFR calc non Af Amer: >60 mL/min (ref >60)
Glucose, Bld: 154 mg/dL — ABNORMAL HIGH (ref 70–99)
Potassium: 3.8 mmol/L (ref 3.5–5.1)
Sodium: 136 mmol/L (ref 135–145)

## 2018-09-30 LAB — CBC
HCT: 39.1 % (ref 36.0–46.0)
Hemoglobin: 13.1 g/dL (ref 12.0–15.0)
MCH: 30.8 pg (ref 26.0–34.0)
MCHC: 33.5 g/dL (ref 30.0–36.0)
MCV: 91.8 fL (ref 80.0–100.0)
Platelets: 249 10*3/uL (ref 150–400)
RBC: 4.26 MIL/uL (ref 3.87–5.11)
RDW: 11.9 % (ref 11.5–15.5)
WBC: 8.9 10*3/uL (ref 4.0–10.5)
nRBC: 0 % (ref 0.0–0.2)

## 2018-09-30 MED ORDER — HYDROCODONE-ACETAMINOPHEN 5-325 MG PO TABS: 1.0000 | ORAL_TABLET | Freq: Four times a day (QID) | ORAL | 0 refills | Status: DC | PRN

## 2018-09-30 MED ORDER — TRAMADOL HCL 50 MG PO TABS: 50.0000 mg | ORAL_TABLET | ORAL | Status: DC

## 2018-09-30 MED ORDER — HYDROCODONE-ACETAMINOPHEN 5-325 MG PO TABS
1.0000 | ORAL_TABLET | Freq: Once | ORAL | Status: AC
  Administered 2018-09-30: 1 via ORAL
  Filled 2018-09-30: qty 1

## 2018-09-30 NOTE — Telephone Encounter (Signed)
Pt daughter, Angela Nevin, called. Pt fell yesterday, passed out and hit her head. Pt does not want to be seen- says she is just bruised but Angela Nevin wants to know what to look for in case there is a break. She is requesting a cb- she has left a few messages on nurse line but keeps getting cut off. She is anxious for any help.

## 2018-09-30 NOTE — ED Notes (Signed)
Patient transported to CT 

## 2018-09-30 NOTE — ED Notes (Signed)
Report given to Gracie, RN 

## 2018-09-30 NOTE — ED Triage Notes (Signed)
Pt arrives with concerns over right hip pain from a fall yesterday. Pt/family report pt did hit her head but no loc. PT is normally able to ambulate with no assistance/device but since fall pt has not been able to put full weight on her right side.

## 2018-09-30 NOTE — Telephone Encounter (Signed)
Angela Nevin called back. They are taking pt to ER.

## 2018-09-30 NOTE — ED Notes (Signed)
Updated daughter Angela Nevin on plan of care

## 2018-09-30 NOTE — Telephone Encounter (Signed)
I would encourage eval re: syncope in the clinic when possible.  64min OV.  If any new/recurrent sx or CP/SOB, then to ER.  If any neuro sx, ie weakness, speech changes, then to ER.  Please triage patient o/w.  Thanks.

## 2018-09-30 NOTE — Telephone Encounter (Signed)
I spoke with Angela Nevin; thinks pt passed out prior to falling backwards; pt does not remember falling. Angela Nevin said has w/c and is taking pt to [facility] ED now for eval; pt having pain in thigh area when standing due to ? Hip injury. FYI to Dr Damita Dunnings.

## 2018-09-30 NOTE — ED Notes (Signed)
Pt assisted to toilet, pt limps on right leg where injury occured

## 2018-09-30 NOTE — ED Notes (Signed)
Pt ambulated very well in room with walker with slight discomfort, pt states she feels pain but it is tolerable and her legs do not feel weak.

## 2018-09-30 NOTE — Discharge Instructions (Signed)
No driving today or while taking hydrocodone. Use only as prescribed. STOP use of tramadol.

## 2018-09-30 NOTE — ED Provider Notes (Signed)
[facility] Emergency Department Provider Note   ____________________________________________   First MD Initiated Contact with Patient 09/30/18 1550     (approximate)  I have reviewed the triage vital signs and the nursing notes.   HISTORY  Chief Complaint Fall  HPI Fran Lowesancy P Kerney ElbeFaucette is a 84 y.o. female here for evaluation after a fall yesterday  Patient reports she was out with her family,  she had a fall while standing, fell onto concrete.  She reports that she is able to walk but her right hip behind the back of the right hip is been very sore.  She takes tramadol last took a dose about 4 hours ago, relieves the pain some and she is able to walk on it but just feels very "sore".  No other injury.  No injury to the foot ankle lower leg knee or upper thigh, she reports all the pain is just at the backside of pelvis/hip region.  No cervical thoracic or lumbar pain.  She does not recall the event very well she reports she fell hard enough to have some bleeding from the back of her head.  She did not have any preceding symptoms, she is not exactly sure what happened thinks she fell struck her head and then also injured her right leg.  She is not any fevers chills or recent illness.  No chest pain no trouble breathing and she is been at home since the fall occurred yesterday.  Eating and drinking normally.  Past Medical History:  Diagnosis Date  . Anemia    treated ~2009 with iron, resolved  . Arthritis   . Cataract    surgery on R catarct 2012  . Chronic systolic CHF (congestive heart failure) (HCC)    a. TTE 2012: EF of 45-50%, mild diffuse HK, trivial AI, mild MR, mildly dilated RV with mild reduction of RVSF, mild to moderate TR, mild to moderate PR, mildly elevated PASP  . COPD (chronic obstructive pulmonary disease) (HCC)   . Family history of adverse reaction to anesthesia    Mother - altered mental status (long term)  . Glaucoma   . Hyperglycemia    mild inc in fasting sugar  . Hyperlipidemia   . Hypothyroidism   . Insomnia   . Migraines    without aura, sx since age 84  . Osteopenia    prev with 10 years of fosamax and intolerant of evista  . PAF (paroxysmal atrial fibrillation) (HCC)    a. initially noted 2012; b. CHADS2VASc at least 5 (CHF, age x 2, vasacular disease, female)  . Pulmonary hypertension (HCC)   . Wears dentures    full upper and lower    Patient Active Problem List   Diagnosis Date Noted  . Abnormal weight loss 08/19/2017  . Hypotension 01/18/2016  . Fatigue 07/12/2015  . Hyperglycemia 05/25/2014  . OA (osteoarthritis) 05/25/2014  . Adjustment disorder 01/07/2014  . Environmental allergies 12/13/2013  . Arthritis 06/27/2013  . Carotid stenosis 11/04/2012  . Migraine 11/12/2011  . Medicare annual wellness visit, subsequent 05/15/2011  . Osteopenia 01/25/2011  . Insomnia 01/25/2011  . Hypothyroid 01/25/2011  . Advance directive discussed with patient 01/24/2011  . Atrial fibrillation (HCC) 08/17/2010  . Sick sinus syndrome (HCC) 08/17/2010  . Smoking 08/17/2010  . Hyperlipidemia 08/17/2010    Past Surgical History:  Procedure Laterality Date  . CATARACT EXTRACTION    . CATARACT EXTRACTION W/PHACO Left 08/27/2016   Procedure: CATARACT EXTRACTION PHACO AND INTRAOCULAR LENS  PLACEMENT (IOC)  Left;  Surgeon: Lockie MolaBrasington, Chadwick, MD;  Location[facility];  Service: Ophthalmology;  Laterality: Left;  . COLONOSCOPY  2012  . REFRACTIVE SURGERY      Prior to Admission medications   Medication Sig Start Date End Date Taking? Authorizing Provider  aspirin-acetaminophen-caffeine (EXCEDRIN MIGRAINE) [phone] [id number] MG per tablet Take 1 tablet by mouth every 6 (six) hours as needed.      [provider]  fish oil-omega-3 fatty acids 1000 MG capsule Take 1 g by mouth daily.      [provider]  flecainide (TAMBOCOR) 50 MG tablet Take 1.5 tablets (75 mg total) by mouth 2 (two) times daily.  04/01/18   Duke SalviaKlein, Steven C, MD  HYDROcodone-acetaminophen (NORCO/VICODIN) 5-325 MG tablet Take 1 tablet by mouth every 6 (six) hours as needed for up to 3 days for severe pain. 09/30/18 10/03/18  Sharyn CreamerQuale, Azaylia Fong, MD  latanoprost (XALATAN) 0.005 % ophthalmic solution Place 1 drop into both eyes at bedtime.    [provider]  levothyroxine (SYNTHROID, LEVOTHROID) 50 MCG tablet Take 1 tablet (50 mcg total) by mouth daily. 07/01/18   Joaquim Namuncan, Graham S, MD  Multiple Vitamin (MULTIVITAMIN) tablet Take 1 tablet by mouth daily.    [provider]  traMADol Janean Sark(ULTRAM) 50 MG tablet TAKE ONE AND ONE-HALF TABLETS THREE TIMES A DAY 07/01/18   Joaquim Namuncan, Graham S, MD    Allergies Codeine; Cortisone; Decongestant [pseudoephedrine hcl]; Naproxen; and Simvastatin  Family History  Problem Relation Age of Onset  . Diabetes Father   . Migraines Mother   . Stroke Mother        TIAs  . Breast cancer Neg Hx   . Colon cancer Neg Hx     Social History Social History   Tobacco Use  . Smoking status: Current Every Day Smoker    Packs/day: 0.25    Years: 50.00    Pack years: 12.50    Types: Cigarettes  . Smokeless tobacco: Never Used  . Tobacco comment: started smoking in 1958  Substance Use Topics  . Alcohol use: No  . Drug use: No    Review of Systems Constitutional: No fever/chills Eyes: No visual changes. ENT: No sore throat. Cardiovascular: Denies chest pain. Respiratory: Denies shortness of breath. Gastrointestinal: No abdominal pain.   Genitourinary: Negative for dysuria. Musculoskeletal: Negative for back pain.  See HPI Skin: Negative for rash except had an abrasion over her scalp that bled a little bit after the fall. Neurological: Negative for headaches, areas of focal weakness or numbness.    ____________________________________________   PHYSICAL EXAM:  VITAL SIGNS: ED Triage Vitals [09/30/18 1448]  Enc Vitals Group     BP 124/80     Pulse Rate (!) 58     Resp 18      Temp (!) 97.4 F (36.3 C)     Temp Source Oral     SpO2 97 %     Weight 98 lb (44.5 kg)     Height 5\' 2"  (1.575 m)     Head Circumference      Peak Flow      Pain Score 5     Pain Loc      Pain Edu?      Excl. in GC?     Constitutional: Alert and oriented. Well appearing and in no acute distress. Eyes: Conjunctivae are normal. Head: Atraumatic except for a slight abrasion without any bleeding or evidence of infection about the size of a quarter  over the right posterior parietal region. Nose: No congestion/rhinnorhea. Mouth/Throat: Mucous membranes are moist. Neck: No stridor.  Cardiovascular: Normal rate, regular rhythm. Grossly normal heart sounds.  Good peripheral circulation. Respiratory: Normal respiratory effort.  No retractions. Lungs CTAB. Gastrointestinal: Soft and nontender. No distention. Musculoskeletal:   RIGHT Right upper extremity demonstrates normal strength, good use of all muscles. No edema bruising or contusions of the right shoulder/upper arm, right elbow, right forearm / hand. Full range of motion of the right right upper extremity without pain. No evidence of trauma. Strong radial pulse. Intact median/ulnar/radial neuro-muscular exam.  LEFT Left upper extremity demonstrates normal strength, good use of all muscles. No edema bruising or contusions of the left shoulder/upper arm, left elbow, left forearm / hand. Full range of motion of the left  upper extremity without pain. No evidence of trauma. Strong radial pulse. Intact median/ulnar/radial neuro-muscular exam.  Lower Extremities  No edema. Normal DP/PT pulses bilateral with good cap refill.  Normal neuro-motor function lower extremities bilateral.  RIGHT Right lower extremity demonstrates normal strength, good use of all muscles. No edema bruising or contusions of the right hip, right knee, right ankle.  She does report some tenderness to palpation over the right posterior buttock region, but not focally  over the trochanter.  No shortening or rotation.  Full range of motion of the right lower extremity without pain except she reports it is "sore" to move pointing towards her right posterior buttock region but no lesion is noted or contusion or bleeding.  No hematoma.. No pain on axial loading.   LEFT Left lower extremity demonstrates normal strength, good use of all muscles. No edema bruising or contusions of the hip,  knee, ankle.  full range of motion of the left lower extremity without pain. No pain on axial loading. No evidence of trauma.   Neurologic:  Normal speech and language. No gross focal neurologic deficits are appreciated.  Skin:  Skin is warm, dry and intact. No rash noted. Psychiatric: Mood and affect are normal. Speech and behavior are normal.  ____________________________________________   LABS (all labs ordered are listed, but only abnormal results are displayed)  Labs Reviewed  BASIC METABOLIC PANEL - Abnormal; Notable for the following components:      Result Value   Glucose, Bld 154 (*)    All other components within normal limits  CBC   ____________________________________________  EKG  Reviewed enterotomy at 1635 Heart rate 55 QRS 120 QTc 07/27/1936 Normal sinus rhythm no evidence of acute ischemia.  Some prolongation of the QT interval ____________________________________________  RADIOLOGY  Dg Lumbar Spine 2-3 Views  Result Date: 09/30/2018 CLINICAL DATA:  Fall with pain EXAM: LUMBAR SPINE - 2-3 VIEW COMPARISON:  None. FINDINGS: Suspected transitional anatomy with 6 non rib-bearing lumbar type vertebra. For the purposes of reporting, last well-formed vertebral body will be designated L5. Acute right superior and inferior pubic rami fractures. Trace retrolisthesis L2 on L3. Vertebral body heights are normal. Moderate diffuse degenerative changes of the lumbar spine with multiple level disc space narrowing and osteophyte. IMPRESSION: 1. Diffuse degenerative  changes without acute osseous abnormality 2. Acute right superior and inferior pubic rami fractures. Electronically Signed   By: Jasmine PangKim  Fujinaga M.D.   On: 09/30/2018 16:43   Ct Head Wo Contrast  Result Date: 09/30/2018 CLINICAL DATA:  Head injury after fall.  No loss of consciousness. EXAM: CT HEAD WITHOUT CONTRAST TECHNIQUE: Contiguous axial images were obtained from the base of the skull through the vertex  without intravenous contrast. COMPARISON:  None. FINDINGS: Brain: Mild diffuse cortical atrophy is noted. Mild chronic ischemic white matter disease is noted. No mass effect or midline shift is noted. Ventricular size is within normal limits. There is no evidence of mass lesion, hemorrhage or acute infarction. Vascular: No hyperdense vessel or unexpected calcification. Skull: Normal. Negative for fracture or focal lesion. Sinuses/Orbits: No acute finding. Other: None. IMPRESSION: Mild diffuse cortical atrophy. Mild chronic ischemic white matter disease. No acute intracranial abnormality seen. Electronically Signed   By: Marijo Conception M.D.   On: 09/30/2018 16:19   Ct Hip Right Wo Contrast  Result Date: 09/30/2018 CLINICAL DATA:  Right hip pain after fall. EXAM: CT OF THE RIGHT HIP WITHOUT CONTRAST TECHNIQUE: Multidetector CT imaging of the right hip was performed according to the standard protocol. Multiplanar CT image reconstructions were also generated. COMPARISON:  Radiographs of same day. FINDINGS: Mildly displaced fractures are seen involving the right superior and inferior pubic rami. Right hip joint is unremarkable. There is no definite evidence of fracture involving the right acetabulum or visualized proximal right femur. Small amount of hematoma is seen in the right side of the pelvis along the pelvic wall. This most likely is related to rami fractures. IMPRESSION: Mildly displaced fractures are seen involving the right superior and inferior pubic rami with small adjacent right pelvic hematoma. No  definite evidence of fracture is seen involving the right acetabulum or visualized portion of right proximal femur. Electronically Signed   By: Marijo Conception M.D.   On: 09/30/2018 17:37   Dg Hip Unilat  With Pelvis 2-3 Views Right  Result Date: 09/30/2018 CLINICAL DATA:  Hip pain after fall EXAM: DG HIP (WITH OR WITHOUT PELVIS) 2-3V RIGHT COMPARISON:  None. FINDINGS: Acute mildly displaced and comminuted fractures involving the right superior and inferior pubic rami. Both femoral heads project in joint. The SI joints are non widened. The pubic symphysis is intact IMPRESSION: Acute mildly displaced and comminuted fractures involving the right superior and inferior pubic rami Electronically Signed   By: Donavan Foil M.D.   On: 09/30/2018 16:41    Imaging reviewed, discussed with Dr. Harlow Mares ____________________________________________   PROCEDURES  Procedure(s) performed: None  Procedures  Critical Care performed: No  ____________________________________________   INITIAL IMPRESSION / ASSESSMENT AND PLAN / ED COURSE  Pertinent labs & imaging results that were available during my care of the patient were reviewed by me and considered in my medical decision making (see chart for details).   Fall.  Probably mechanical, but given her history and her somewhat poor recall the event I will double check that EKG CBC and BMP look normal.  Not associated neurologic cardiac or pulmonary symptoms.  No recent illness.  On examination small abrasion over the head, do the questionable loss consciousness that occurred a CT of the head will be ordered to rule out intracranial hemorrhage.  Her mental status is normal with normal level of alertness.  Additionally imaging of the lower lumbar and right hip/pelvic region will be performed though my pretest probability for an acute right hip fracture is quite low given her ability to ambulate upon it and reassuring examination I suspect likely contusion occurring  of the right posterior buttock region is most likely.    Clinical Course as of Sep 29 1948  Tue Sep 30, 2018  1805 Patient fully alert and oriented.  Reports tramadol not helping too much with her pain and given her pelvic fractures I discussed  with her and we will trial hydrocodone here.  She is comfortable the plan to go home once able to find a comfortable control for her pain   [MQ]  1828 Per Dr. Odis Luster, ortho, weight bearing as tolerated. He can see patient and requests follow-up in 1 week with him.    [MQ]    Clinical Course User Index [MQ] Sharyn Creamer, MD   Fran Lowes Wormley was evaluated in Emergency Department on 09/30/2018 for the symptoms described in the history of present illness. She was evaluated in the context of the global COVID-19 pandemic, which necessitated consideration that the patient might be at risk for infection with the SARS-CoV-2 virus that causes COVID-19. Institutional protocols and algorithms that pertain to the evaluation of patients at risk for COVID-19 are in a state of rapid change based on information released by regulatory bodies including the CDC and federal and state organizations. These policies and algorithms were followed during the patient's care in the ED.   ----------------------------------------- 7:50 PM on 09/30/2018 -----------------------------------------  Ambulated well with pain well controlled.  Comfortable with plan for discharge and close follow-up. I will prescribe the patient a narcotic pain medicine due to their condition which I anticipate will cause at least moderate pain short term. I discussed with the patient safe use of narcotic pain medicines, and that they are not to drive, work in dangerous areas, or ever take more than prescribed (no more than 1 pill every 6 hours). We discussed that this is the type of medication that can be  overdosed on and the risks of this type of medicine. Patient is very agreeable to only use as prescribed  and to never use more than prescribed.  Database checked, patient will discontinue tramadol.  Family driving her home  ____________________________________________   FINAL CLINICAL IMPRESSION(S) / ED DIAGNOSES  Final diagnoses:  Closed fracture of ramus of right pubis, initial encounter (HCC)  Closed head injury, initial encounter  Abrasion        Note:  This document was prepared using Dragon voice recognition software and may include unintentional dictation errors       Sharyn Creamer, MD 09/30/18 1951

## 2018-10-01 NOTE — Telephone Encounter (Signed)
Noted. Will await ER notes.  Thanks.  

## 2018-10-02 ENCOUNTER — Telehealth: Payer: Self-pay | Admitting: Family Medicine

## 2018-10-02 DIAGNOSIS — S329XXA Fracture of unspecified parts of lumbosacral spine and pelvis, initial encounter for closed fracture: Secondary | ICD-10-CM | POA: Diagnosis not present

## 2018-10-02 NOTE — Telephone Encounter (Signed)
Daughter Claiborne Billings) says that it is not a matter of someone being with the patient, it is about her safety.  Daughter was with her last evening and asked the patient to call her if she needed anything but the patient didn't call and ended up falling in the hallway.  Daughter could not get her up and had to call her brother and nephew who is an EMT to get her up.  Family feels that the only safe alternative would be a facility.  They will talk to you more tomorrow.

## 2018-10-02 NOTE — Telephone Encounter (Signed)
Vanessa [name] Patient's Daughter called today with several concerns about her mom.  Patient fell on Tuesday and ended up in the ER, they stated that patient has a pelvic fracture.   Daughter stays with patient at night to help assist her if she needs help doing anything or getting up.   Last night patient went to get up and ended up falling and daughter stated it took 3 people to get her up and back in her wheel chair.    Daughter scheduled an appointment for tomorrow to discuss getting the patient assistance with rehab or home health nurse. But They want to make sure they are doing what is best for the patient   Daughter's Phone-  [phone]

## 2018-10-02 NOTE — Telephone Encounter (Signed)
I think getting home out to see her is reasonable.  I can put in the order.   The main questions are these- What does the patient want (ie does she want to continue at home)? Can her family safely care for her at home?  Have them consider in the meantime.  I can order home health tomorrow at the visit if needed. Thanks.

## 2018-10-03 ENCOUNTER — Ambulatory Visit (INDEPENDENT_AMBULATORY_CARE_PROVIDER_SITE_OTHER): Payer: PPO | Admitting: Family Medicine

## 2018-10-03 DIAGNOSIS — S329XXD Fracture of unspecified parts of lumbosacral spine and pelvis, subsequent encounter for fracture with routine healing: Secondary | ICD-10-CM

## 2018-10-03 MED ORDER — VITAMIN D 50 MCG (2000 UT) PO CAPS: 2000.0000 [IU] | ORAL_CAPSULE | Freq: Every day | ORAL | Status: DC

## 2018-10-03 NOTE — Progress Notes (Signed)
Virtual visit completed through WebEx or similar program Patient location: home  Provider location: Financial controller at [facility], office  Pandemic considerations d/w pt.   Limitations and rationale for visit method d/w patient.  Patient agreed to proceed.   CC: pelvic fracture.    HPI:   She had fallen and required emergency room evaluation with pelvic ramus fracture noted.  Discussed.  Imaging reviewed.  She had seen ortho yesterday.  D/w pt about pain mgmt with oxycodone and tylenol and tramadol.  Bowel regimen d/w pt. Can use miralax prn if inc in fiber doesn't help.    Imaging d/w pt.   1. Diffuse degenerative changes without acute osseous abnormality 2. Acute right superior and inferior pubic rami fractures.  At time of call, no pain, she is laying down.  Pain with leg ROM, when she moves her leg.  She is going to have HH/aide around the clock for now.  She had fallen at home since the event.  She has a bedside commode and wheelchair and walker.  Family is going to build a ramp to get in the house.  She has no internal stairs.   Reasonable to start vit D.    Patient would rather be at home instead of a facility.  We discussed rehab admission if needed.  Patient would prefer not to go to rehab at this point.    Meds and allergies reviewed.   ROS: Per HPI unless specifically indicated in ROS section   NAD Speech wnl Laying on the sofa with her daughter beside her via video conference.  A/P: Pelvic fracture.  She is going to need extra help at home.  Family is setting up home aids around the clock and home health will be coming out.  Family is going to build a wheelchair ramp for her external stairs.  She has no internal stairs.  D/w pt about pain mgmt with oxycodone and tylenol and tramadol.  Bowel regimen d/w pt. Can use miralax prn if inc in fiber doesn't help.    The goal is for her to gradually increase her level of independence and maintain safety and gradually get back to  normal gait and independent home life.  She will to avoid a facility if possible.  Discussed taking vitamin D.  See med orders.  They can get that over-the-counter.  Update me as needed.  All agree with plan.

## 2018-10-03 NOTE — Telephone Encounter (Signed)
Noted. Thanks.

## 2018-10-05 DIAGNOSIS — S3289XD Fracture of other parts of pelvis, subsequent encounter for fracture with routine healing: Secondary | ICD-10-CM | POA: Diagnosis not present

## 2018-10-05 DIAGNOSIS — Z9181 History of falling: Secondary | ICD-10-CM | POA: Diagnosis not present

## 2018-10-05 DIAGNOSIS — S329XXA Fracture of unspecified parts of lumbosacral spine and pelvis, initial encounter for closed fracture: Secondary | ICD-10-CM | POA: Insufficient documentation

## 2018-10-05 DIAGNOSIS — Z79891 Long term (current) use of opiate analgesic: Secondary | ICD-10-CM | POA: Diagnosis not present

## 2018-10-05 NOTE — Assessment & Plan Note (Signed)
  Pelvic fracture.  She is going to need extra help at home.  Family is setting up home aids around the clock and home health will be coming out.  Family is going to build a wheelchair ramp for her external stairs.  She has no internal stairs.  D/w pt about pain mgmt with oxycodone and tylenol and tramadol.  Bowel regimen d/w pt. Can use miralax prn if inc in fiber doesn't help.    The goal is for her to gradually increase her level of independence and maintain safety and gradually get back to normal gait and independent home life.  She will to avoid a facility if possible.  Discussed taking vitamin D.  See med orders.  They can get that over-the-counter.  Update me as needed.  All agree with plan.  >25 minutes spent in face to face time with patient via video call, >50% spent in counselling or coordination of care.

## 2018-10-06 ENCOUNTER — Encounter: Payer: Self-pay | Admitting: Family Medicine

## 2018-10-07 ENCOUNTER — Telehealth: Payer: Self-pay | Admitting: Family Medicine

## 2018-10-07 MED ORDER — OXYCODONE HCL 5 MG PO TABS: 7.5000 mg | ORAL_TABLET | Freq: Four times a day (QID) | ORAL | 0 refills | Status: DC | PRN

## 2018-10-07 NOTE — Telephone Encounter (Signed)
Patient's daughter left a voicemail wanting to make sure that Dr. Damita Dunnings got her mychart message yesterday. Vanessa [name] stated that her mom's medication runs today and requested that a script be sent to the pharmacy for her.pain.

## 2018-10-07 NOTE — Telephone Encounter (Signed)
Vanessa [name] (daughter) DPR advised.

## 2018-10-07 NOTE — Telephone Encounter (Signed)
Please call her back, see mychart message.  rx sent for oxycodone.  I would try a higher dose to see if that helps.   Sig adjusted.  Let me know if that is effective.  Sedation caution.   I would not start prophylactic abx for UTI prevention.  I would have her keep drinking water and voiding routinely.  If we start her on preventive abx, she'll may still end up with a UTI and it will be resistant to the abx that we started her on.    Thanks.

## 2018-10-14 ENCOUNTER — Telehealth: Payer: Self-pay | Admitting: *Deleted

## 2018-10-14 MED ORDER — TIZANIDINE HCL 2 MG PO CAPS: 2.0000 mg | ORAL_CAPSULE | Freq: Three times a day (TID) | ORAL | 1 refills | Status: DC | PRN

## 2018-10-14 NOTE — Telephone Encounter (Signed)
Patient's daughter Claiborne Billings left a voicemail stating that her mom had broken her hip and did a virtual visit with Dr. Damita Dunnings a couple of weeks ago, Claiborne Billings stated that her mom is having muscle spasms in her leg and having difficulty sleeping. Claiborne Billings wants to know if Dr. Damita Dunnings will send in a muscle relaxer for her mom?  Claiborne Billings stated that her pain has gotten better. Pharmacy [facility]

## 2018-10-14 NOTE — Telephone Encounter (Signed)
Would try tizanidine.  rx sent.  Sedation caution.  Update me as needed.  Thanks.

## 2018-10-14 NOTE — Telephone Encounter (Signed)
Spoke to pt's daughter per DPR. 

## 2018-10-21 ENCOUNTER — Encounter: Payer: Self-pay | Admitting: Family Medicine

## 2018-10-21 ENCOUNTER — Other Ambulatory Visit: Payer: Self-pay | Admitting: Family Medicine

## 2018-10-21 MED ORDER — OXYCODONE HCL 5 MG PO TABS: 7.5000 mg | ORAL_TABLET | Freq: Four times a day (QID) | ORAL | 0 refills | Status: DC | PRN

## 2018-10-21 NOTE — Progress Notes (Signed)
See mychart message re: refill.

## 2018-10-22 DIAGNOSIS — S3289XD Fracture of other parts of pelvis, subsequent encounter for fracture with routine healing: Secondary | ICD-10-CM | POA: Diagnosis not present

## 2018-10-22 DIAGNOSIS — Z79891 Long term (current) use of opiate analgesic: Secondary | ICD-10-CM | POA: Diagnosis not present

## 2018-10-22 DIAGNOSIS — Z9181 History of falling: Secondary | ICD-10-CM | POA: Diagnosis not present

## 2018-11-04 DIAGNOSIS — Z9181 History of falling: Secondary | ICD-10-CM | POA: Diagnosis not present

## 2018-11-04 DIAGNOSIS — S3289XD Fracture of other parts of pelvis, subsequent encounter for fracture with routine healing: Secondary | ICD-10-CM | POA: Diagnosis not present

## 2018-11-04 DIAGNOSIS — Z79891 Long term (current) use of opiate analgesic: Secondary | ICD-10-CM | POA: Diagnosis not present

## 2018-11-13 ENCOUNTER — Other Ambulatory Visit: Payer: Self-pay | Admitting: Family Medicine

## 2018-11-13 DIAGNOSIS — S329XXD Fracture of unspecified parts of lumbosacral spine and pelvis, subsequent encounter for fracture with routine healing: Secondary | ICD-10-CM | POA: Diagnosis not present

## 2018-11-14 NOTE — Telephone Encounter (Signed)
Name of Medication: Oxycodone Name of Pharmacy: [facility] or Written Date and Quantity:  40 tablet 0 10/21/2018  Last Office Visit and Type: 10/03/2018 Acute Next Office Visit and Type: None Last Controlled Substance Agreement Date: ? Last UDS: ?

## 2018-11-15 ENCOUNTER — Encounter: Payer: Self-pay | Admitting: Family Medicine

## 2018-11-16 NOTE — Telephone Encounter (Signed)
Sent. Thanks.  °Please get update on patient.   °

## 2018-11-17 NOTE — Telephone Encounter (Signed)
Daughter sent a MyChart message saying she is doing well and was requesting the Oxycodone refill.  I informed the daughter through [place] that the refill had already been sent.

## 2018-11-18 NOTE — Telephone Encounter (Signed)
Thanks

## 2018-11-21 ENCOUNTER — Other Ambulatory Visit: Payer: Self-pay

## 2018-12-25 ENCOUNTER — Telehealth: Payer: Self-pay | Admitting: Internal Medicine

## 2018-12-25 NOTE — Telephone Encounter (Signed)
Pt c/o medication issue:  1. Name of Medication: Flecainide 50 mg (take 1.5 tablets 2x day)  2. How are you currently taking this medication (dosage and times per day)? take 1.5 tablets (75 mg)  2x day  3. Are you having a reaction (difficulty breathing--STAT)? no  4. What is your medication issue? Patients daughter calling in voiceing concerns over mother having to take this medication twice a day. The night time dose is not a problem but patients family is hiring caregivers and fear that they might forget to give the medication to her. Patients daughter is curious to know if they could adjust the time or amount of medication to once a time. Please advise when able.

## 2018-12-25 NOTE — Telephone Encounter (Signed)
To Dr. Caryl Comes to review- Thoughts?? Since flecainide is a BID dosing.

## 2018-12-28 ENCOUNTER — Other Ambulatory Visit: Payer: Self-pay | Admitting: Family Medicine

## 2018-12-28 NOTE — Telephone Encounter (Signed)
I dont know waht to say x the flecainide is twice a day The only once a day is amio which with her history of bradycardia might well trigger a need for pacing Do we need to do telehealth visit

## 2018-12-30 NOTE — Telephone Encounter (Signed)
LOV 10/03/2018 No future appointments. In med list it says historical for this medication. Please review.

## 2018-12-30 NOTE — Telephone Encounter (Signed)
I spoke with Angela Nevin (ok per DPR). I advised her of Dr. Olin Pia recommendations.  Per Angela Nevin, they will try to stay with the flecainide. The patient is becoming "a handfull" per Angela Nevin as they have a caregiver come in the in the morning and family is there in the evening. However, the patient fell and fractured her pelvis not long ago as she was not taking her medications, she felt she didn't need them.  Angela Nevin advised they were just trying to get a better handle on her medications. I offered an e-visit, but Angela Nevin declined at this time.  I advised if the family feels more concerned or frustrated with the situation, to call and we can set the patient up for an e-visit with Dr. Caryl Comes.  Angela Nevin voiced understanding and was agreeable.

## 2018-12-31 NOTE — Telephone Encounter (Signed)
Please check with patient.  I am fine with refilling this medication as needed but I just wanted to check up on her med list and her pain level.  Please see if she is off oxycodone.  She had previously used tramadol for migraines.  Preemptive treatment with tramadol was the only thing that really helped.  Let me know how she is doing and I will work on the prescription.  Thanks.

## 2019-01-01 NOTE — Telephone Encounter (Signed)
Patient says she does not have the oxycodone any longer and the Tramadol is mostly for her migraines but occasionally if her hip is bothering her, she will take the Tramadol for that.

## 2019-01-02 NOTE — Telephone Encounter (Signed)
Sent. Thanks.   

## 2019-03-16 ENCOUNTER — Encounter: Payer: Self-pay | Admitting: Family Medicine

## 2019-03-16 ENCOUNTER — Other Ambulatory Visit: Payer: Self-pay

## 2019-03-16 ENCOUNTER — Ambulatory Visit (INDEPENDENT_AMBULATORY_CARE_PROVIDER_SITE_OTHER): Payer: PPO | Admitting: Family Medicine

## 2019-03-16 VITALS — BP 106/64 | HR 84 | Temp 97.2°F | Ht 62.0 in | Wt 107.2 lb

## 2019-03-16 DIAGNOSIS — M8580 Other specified disorders of bone density and structure, unspecified site: Secondary | ICD-10-CM

## 2019-03-16 DIAGNOSIS — E039 Hypothyroidism, unspecified: Secondary | ICD-10-CM

## 2019-03-16 DIAGNOSIS — S329XXD Fracture of unspecified parts of lumbosacral spine and pelvis, subsequent encounter for fracture with routine healing: Secondary | ICD-10-CM | POA: Diagnosis not present

## 2019-03-16 DIAGNOSIS — G43009 Migraine without aura, not intractable, without status migrainosus: Secondary | ICD-10-CM

## 2019-03-16 DIAGNOSIS — Z9181 History of falling: Secondary | ICD-10-CM | POA: Diagnosis not present

## 2019-03-16 DIAGNOSIS — R413 Other amnesia: Secondary | ICD-10-CM

## 2019-03-16 DIAGNOSIS — Z0000 Encounter for general adult medical examination without abnormal findings: Secondary | ICD-10-CM

## 2019-03-16 DIAGNOSIS — Z7189 Other specified counseling: Secondary | ICD-10-CM

## 2019-03-16 LAB — CBC WITH DIFFERENTIAL/PLATELET
Basophils Absolute: 0.1 10*3/uL (ref 0.0–0.1)
Basophils Relative: 0.8 % (ref 0.0–3.0)
Eosinophils Absolute: 0.1 10*3/uL (ref 0.0–0.7)
Eosinophils Relative: 1 % (ref 0.0–5.0)
HCT: 41.8 % (ref 36.0–46.0)
Hemoglobin: 13.6 g/dL (ref 12.0–15.0)
Lymphocytes Relative: 11.8 % — ABNORMAL LOW (ref 12.0–46.0)
Lymphs Abs: 0.8 10*3/uL (ref 0.7–4.0)
MCHC: 32.5 g/dL (ref 30.0–36.0)
MCV: 94.5 fl (ref 78.0–100.0)
Monocytes Absolute: 0.5 10*3/uL (ref 0.1–1.0)
Monocytes Relative: 6.4 % (ref 3.0–12.0)
Neutro Abs: 5.7 10*3/uL (ref 1.4–7.7)
Neutrophils Relative %: 80 % — ABNORMAL HIGH (ref 43.0–77.0)
Platelets: 270 10*3/uL (ref 150.0–400.0)
RBC: 4.43 Mil/uL (ref 3.87–5.11)
RDW: 12.7 % (ref 11.5–15.5)
WBC: 7.1 10*3/uL (ref 4.0–10.5)

## 2019-03-16 LAB — LIPID PANEL
Cholesterol: 238 mg/dL — ABNORMAL HIGH (ref 0–200)
HDL: 64.3 mg/dL (ref >39.00)
LDL Cholesterol: 158 mg/dL — ABNORMAL HIGH (ref 0–99)
NonHDL: 173.91
Total CHOL/HDL Ratio: 4
Triglycerides: 78 mg/dL (ref 0.0–149.0)
VLDL: 15.6 mg/dL (ref 0.0–40.0)

## 2019-03-16 LAB — COMPREHENSIVE METABOLIC PANEL
ALT: 7 U/L (ref 0–35)
AST: 19 U/L (ref 0–37)
Albumin: 3.8 g/dL (ref 3.5–5.2)
Alkaline Phosphatase: 55 U/L (ref 39–117)
BUN: 15 mg/dL (ref 6–23)
CO2: 31 mEq/L (ref 19–32)
Calcium: 9.6 mg/dL (ref 8.4–10.5)
Chloride: 101 mEq/L (ref 96–112)
Creatinine, Ser: 0.87 mg/dL (ref 0.40–1.20)
GFR: 62.27 mL/min (ref >60.00)
Glucose, Bld: 97 mg/dL (ref 70–99)
Potassium: 4.9 mEq/L (ref 3.5–5.1)
Sodium: 141 mEq/L (ref 135–145)
Total Bilirubin: 0.9 mg/dL (ref 0.2–1.2)
Total Protein: 6.4 g/dL (ref 6.0–8.3)

## 2019-03-16 LAB — VITAMIN D 25 HYDROXY (VIT D DEFICIENCY, FRACTURES): VITD: 39.46 ng/mL (ref 30.00–100.00)

## 2019-03-16 LAB — TSH: TSH: 1.64 u[IU]/mL (ref 0.35–4.50)

## 2019-03-16 LAB — VITAMIN B12: Vitamin B-12: 183 pg/mL — ABNORMAL LOW (ref 211–911)

## 2019-03-16 NOTE — Progress Notes (Signed)
This visit occurred during the SARS-CoV-2 public health emergency.  Safety protocols were in place, including screening questions prior to the visit, additional usage of staff PPE, and extensive cleaning of exam room while observing appropriate contact time as indicated for disinfecting solutions.   I have personally reviewed the Medicare Annual Wellness questionnaire and have noted 1. The patient's medical and social history 2. Their use of alcohol, tobacco or illicit drugs 3. Their current medications and supplements 4. The patient's functional ability including ADL's, fall risks, home safety risks and hearing or visual             impairment. 5. Diet and physical activities 6. Evidence for depression or mood disorders  The patients weight, height, BMI have been recorded in the chart and visual acuity is per eye clinic.  I have made referrals, counseling and provided education to the patient based review of the above and I have provided the pt with a written personalized care plan for preventive services.  Provider list updated- see scanned forms.  Routine anticipatory guidance given to patient.  See health maintenance. The possibility exists that previously documented standard health maintenance information may have been brought forward from a previous encounter into this note.  If needed, that same information has been updated to reflect the current situation based on today's encounter.    Flu 2020 Shingles discussed with patient PNA up-to-date Tetanus 2013 Colon cancer screening not due given her age. Breast cancer screening declined by patient. Bone density test declined by patient. Advance directive-daughter Lavella Hammock designated if patient were incapacitated, patient would like Claiborne Billings to get input from her other 3 siblings prior to making decisions. Cognitive function addressed- see scanned forms- and if abnormal then additional documentation follows.    Still on tramadol daily,  this was the only med that prev helped with migraines.  She quit smoking and has less HA in the meantime, since stopping smoking.    Pelvic fx hx noted.  She has episodic and manageable pain.  Some days are better than others.     Hypothyroid.  Due for labs.  No ADE on med. No neck mass.    Memory change noted by patient and family.  Family noted she was having trouble with some med administration.  She is fallen at home.  She has had more trouble driving.  Family was worried about her driving.  She does have a life alert button and wears it at home.  Typical work-up for memory loss discussed with patient.  Previous CT noted and discussed.  PMH and SH reviewed  Meds, vitals, and allergies reviewed.   ROS: Per HPI.  Unless specifically indicated otherwise in HPI, the patient denies:  General: fever. Eyes: acute vision changes ENT: sore throat Cardiovascular: chest pain Respiratory: SOB GI: vomiting GU: dysuria Musculoskeletal: acute back pain Derm: acute rash Neuro: acute motor dysfunction Psych: worsening mood Endocrine: polydipsia Heme: bleeding Allergy: hayfever  GEN: nad, alert and oriented to the day of the week but not the date. HEENT: ncaat NECK: supple w/o LA CV: rrr. PULM: ctab, no inc wob ABD: soft, +bs EXT: no edema SKIN: no acute rash  Pleasant in conversation but 0 out of 3 on short-term recall.  Health Maintenance  Topic Date Due  . MAMMOGRAM  07/10/2025 (Originally 08/13/2014)  . TETANUS/TDAP  01/06/2022  . INFLUENZA VACCINE  Completed  . DEXA SCAN  Completed  . PNA vac Low Risk Adult  Completed

## 2019-03-16 NOTE — Patient Instructions (Addendum)
Check with your insurance to see if they will cover the shingrix shot.  Go to the lab on the way out.  We'll contact you with your lab report. Don't change your meds for now.    Don't drive for now.  Please consider home health physical therapy.  That may be a good options but I want to see your labs first.  Take care.  Glad to see you.

## 2019-03-17 LAB — RPR: RPR Ser Ql: NONREACTIVE

## 2019-03-18 ENCOUNTER — Other Ambulatory Visit: Payer: Self-pay

## 2019-03-18 DIAGNOSIS — G3184 Mild cognitive impairment, so stated: Secondary | ICD-10-CM | POA: Insufficient documentation

## 2019-03-18 DIAGNOSIS — R413 Other amnesia: Secondary | ICD-10-CM | POA: Insufficient documentation

## 2019-03-18 NOTE — Assessment & Plan Note (Signed)
Fall risk discussed.  Refer for home health PT.

## 2019-03-18 NOTE — Assessment & Plan Note (Signed)
Flu 2020 Shingles discussed with patient PNA up-to-date Tetanus 2013 Colon cancer screening not due given her age. Breast cancer screening declined by patient. Bone density test declined by patient. Advance directive-daughter Vanessa [name] designated if patient were incapacitated, patient would like Vanessa [name] to get input from her other 3 siblings prior to making decisions. Cognitive function addressed- see scanned forms- and if abnormal then additional documentation follows.

## 2019-03-18 NOTE — Assessment & Plan Note (Signed)
  Memory change noted by patient and family.  Family noted she was having trouble with some med administration.  She is fallen at home.  She has had more trouble driving.  Family was worried about her driving.  She does have a life alert button and wears it at home.  I advised her not to drive based on her fall risk.  See notes on labs.

## 2019-03-18 NOTE — Assessment & Plan Note (Signed)
Due for labs.  No ADE on med. No neck mass.

## 2019-03-18 NOTE — Assessment & Plan Note (Signed)
Still on tramadol daily, this was the only med that prev helped with migraines.  She quit smoking and has less HA in the meantime, since stopping smoking.

## 2019-03-18 NOTE — Assessment & Plan Note (Signed)
Advance directive-daughter Kelly Hinnant designated if patient were incapacitated, patient would like Kelly to get input from her other 3 siblings prior to making decisions. 

## 2019-03-22 ENCOUNTER — Other Ambulatory Visit: Payer: Self-pay | Admitting: Family Medicine

## 2019-03-22 ENCOUNTER — Encounter: Payer: Self-pay | Admitting: Family Medicine

## 2019-03-22 DIAGNOSIS — E538 Deficiency of other specified B group vitamins: Secondary | ICD-10-CM

## 2019-03-22 MED ORDER — "BD INSULIN SYRINGE 25G X 1"" 1 ML MISC": 1 refills | Status: DC

## 2019-03-22 MED ORDER — CYANOCOBALAMIN 1000 MCG/ML IJ SOLN: INTRAMUSCULAR | 0 refills | Status: DC

## 2019-03-23 ENCOUNTER — Telehealth: Payer: Self-pay

## 2019-03-23 DIAGNOSIS — E538 Deficiency of other specified B group vitamins: Secondary | ICD-10-CM

## 2019-03-23 MED ORDER — CYANOCOBALAMIN 1000 MCG/ML IJ SOLN: INTRAMUSCULAR | 0 refills | Status: DC

## 2019-03-23 NOTE — Telephone Encounter (Signed)
Renee pharmacist at [facility] wanted to ck instructions for vit B 12 or qty. rx presently written for 1000 mcg IM weekly for 4 doses and then 1000 mcg IM monthly.the qty is # 1 ml.Please advise.

## 2019-03-23 NOTE — Telephone Encounter (Signed)
I spoke with Vanessa [name] at [facility] and she did get B12 # 10 ml and nothing further needed. Thank you.

## 2019-03-23 NOTE — Telephone Encounter (Signed)
I apologize.  Should be 98mL.  rx resent.  Thanks.

## 2019-04-13 ENCOUNTER — Other Ambulatory Visit: Payer: Self-pay | Admitting: Internal Medicine

## 2019-04-13 MED ORDER — FLECAINIDE ACETATE 50 MG PO TABS: 75.0000 mg | ORAL_TABLET | Freq: Two times a day (BID) | ORAL | 0 refills | Status: DC

## 2019-04-13 NOTE — Telephone Encounter (Signed)
*  STAT* If patient is at the pharmacy, call can be transferred to refill team.   1. Which medications need to be refilled? (please list name of each medication and dose if known) Flecainide 75 mg po BID  2. Which pharmacy/location (including street and city if local pharmacy) is medication to be sent to? [place]   3. Do they need a 30 day or 90 day supply? [place]

## 2019-04-13 NOTE — Telephone Encounter (Signed)
Requested Prescriptions   Signed Prescriptions Disp Refills   flecainide (TAMBOCOR) 50 MG tablet 90 tablet 0    Sig: Take 1.5 tablets (75 mg total) by mouth 2 (two) times daily. Please call to schedule appointment for further refills. Thank you!    Authorizing Provider: Deboraha Sprang    Ordering User: Raelene Bott, Dillen Belmontes L

## 2019-05-05 ENCOUNTER — Telehealth: Payer: Self-pay | Admitting: Internal Medicine

## 2019-05-14 ENCOUNTER — Encounter: Payer: Self-pay | Admitting: Family Medicine

## 2019-05-15 ENCOUNTER — Other Ambulatory Visit: Payer: Self-pay | Admitting: Internal Medicine

## 2019-05-15 MED ORDER — FLECAINIDE ACETATE 50 MG PO TABS: 75.0000 mg | ORAL_TABLET | Freq: Two times a day (BID) | ORAL | 0 refills | Status: DC

## 2019-05-15 NOTE — Telephone Encounter (Signed)
*  STAT* If patient is at the pharmacy, call can be transferred to refill team.   1. Which medications need to be refilled? (please list name of each medication and dose if known) flecianide 50 mg (1.5 tablets bid)  2. Which pharmacy/location (including street and city if local pharmacy) is medication to be sent to? [facility]  3. Do they need a 30 day or 90 day supply? 30

## 2019-06-08 ENCOUNTER — Other Ambulatory Visit: Payer: Self-pay | Admitting: *Deleted

## 2019-06-08 ENCOUNTER — Other Ambulatory Visit: Payer: Self-pay | Admitting: Family Medicine

## 2019-06-08 MED ORDER — LEVOTHYROXINE SODIUM 50 MCG PO TABS: 50.0000 ug | ORAL_TABLET | Freq: Every morning | ORAL | 1 refills | Status: DC

## 2019-06-10 ENCOUNTER — Telehealth: Payer: Self-pay | Admitting: Internal Medicine

## 2019-06-10 NOTE — Telephone Encounter (Signed)
*  STAT* If patient is at the pharmacy, call can be transferred to refill team.   1. Which medications need to be refilled? (please list name of each medication and dose if known) flecianide 50 mg (1.5 tablets bid)  2. Which pharmacy/location (including street and city if local pharmacy) is medication to be sent to? [facility]   3. Do they need a 30 day or 90 day supply? 90 day

## 2019-06-10 NOTE — Telephone Encounter (Signed)
Pt requesting 90 day refill Flecainide 50 mg tablet 1.5 mg bid. Pt hasn't been seen since 03/2018.  Pt has upcoming virtual visit with Dr.Klein 07/02/2019 Please advise if ok to refill for 90 day.

## 2019-06-11 MED ORDER — FLECAINIDE ACETATE 50 MG PO TABS: 75.0000 mg | ORAL_TABLET | Freq: Two times a day (BID) | ORAL | 1 refills | Status: DC

## 2019-06-11 NOTE — Telephone Encounter (Signed)
RX refill sent in to the patient's pharmacy for Flecainide 50 mg- take 1.5 tablets (75 mg) by mouth twice daily.   # 90 w/ 1 refill.   The patient is scheduled for a follow up appointment on 07/02/19 with Dr. Graciela Husbands.

## 2019-06-15 ENCOUNTER — Encounter: Payer: Self-pay | Admitting: Family Medicine

## 2019-06-15 ENCOUNTER — Ambulatory Visit (INDEPENDENT_AMBULATORY_CARE_PROVIDER_SITE_OTHER): Payer: PPO | Admitting: Family Medicine

## 2019-06-15 ENCOUNTER — Other Ambulatory Visit: Payer: Self-pay

## 2019-06-15 DIAGNOSIS — E538 Deficiency of other specified B group vitamins: Secondary | ICD-10-CM

## 2019-06-15 DIAGNOSIS — I4891 Unspecified atrial fibrillation: Secondary | ICD-10-CM

## 2019-06-15 LAB — VITAMIN B12: Vitamin B-12: 1049 pg/mL — ABNORMAL HIGH (ref 211–911)

## 2019-06-15 NOTE — Assessment & Plan Note (Signed)
Recheck pending.   Most recent dose 6 days ago.  Has had 6 IM doses so far.  She has some possible improvement in energy level, her recall is better today.  She still has some short term memory changes reported by family.  We'll get her lab report and go from there.  Pt/daugther agree.

## 2019-06-15 NOTE — Progress Notes (Signed)
This visit occurred during the SARS-CoV-2 public health emergency.  Safety protocols were in place, including screening questions prior to the visit, additional usage of staff PPE, and extensive cleaning of exam room while observing appropriate contact time as indicated for disinfecting solutions.  B12 def with most recent dose 6 days ago.  Has had 6 doses.  Patient didn't report much change in the meantime. She needed help getting checked in at last OV but did so on her own today (familiy is here with her).  Her energy level may be a little better.  Her memory is likely about the same.  She is making notes to help.  She is wearing her fall button.  No recent falls.    She occ gets lightheaded on standing.  Resolves soon thereafter.  No vertigo. She has f/u with cards pending.  I asked her to check with cards about that.   F/u with Dr. Graciela Husbands pending.    Prev covid vaccine x2 done.   Meds, vitals, and allergies reviewed.   ROS: Per HPI unless specifically indicated in ROS section   GEN: nad, alert and oriented to year, month, day of the week.   HEENT: ncat NECK: supple w/o LA CV: sounds to be rrr PULM: ctab, no inc wob ABD: soft, +bs EXT: no edema SKIN: well perfused.   Can do math.   3/3 recall, can read a watch.

## 2019-06-15 NOTE — Patient Instructions (Signed)
Go to the lab on the way out.   Recheck B12 level today.  We'll go from there.  Take care.  Glad to see you.

## 2019-06-15 NOTE — Assessment & Plan Note (Signed)
Will route to cardiology as FYI.  She has reasonable pulse and BP today and sounds to be RRR.  I didn't change her meds. D/w pt about cautions on standing.

## 2019-06-18 ENCOUNTER — Ambulatory Visit: Payer: PPO | Admitting: Family Medicine

## 2019-06-18 NOTE — Telephone Encounter (Signed)
Consented

## 2019-06-21 ENCOUNTER — Other Ambulatory Visit: Payer: Self-pay | Admitting: Family Medicine

## 2019-06-21 DIAGNOSIS — E538 Deficiency of other specified B group vitamins: Secondary | ICD-10-CM

## 2019-06-21 MED ORDER — CYANOCOBALAMIN 1000 MCG/ML IJ SOLN: INTRAMUSCULAR | 0 refills | Status: DC

## 2019-07-02 ENCOUNTER — Telehealth (INDEPENDENT_AMBULATORY_CARE_PROVIDER_SITE_OTHER): Payer: PPO | Admitting: Internal Medicine

## 2019-07-02 ENCOUNTER — Encounter: Payer: Self-pay | Admitting: Internal Medicine

## 2019-07-02 ENCOUNTER — Other Ambulatory Visit: Payer: Self-pay

## 2019-07-02 VITALS — BP 122/64 | HR 76 | Ht 60.0 in | Wt 117.0 lb

## 2019-07-02 DIAGNOSIS — R42 Dizziness and giddiness: Secondary | ICD-10-CM

## 2019-07-02 DIAGNOSIS — I471 Supraventricular tachycardia: Secondary | ICD-10-CM | POA: Diagnosis not present

## 2019-07-02 DIAGNOSIS — Z9181 History of falling: Secondary | ICD-10-CM | POA: Diagnosis not present

## 2019-07-02 DIAGNOSIS — I951 Orthostatic hypotension: Secondary | ICD-10-CM

## 2019-07-02 DIAGNOSIS — R001 Bradycardia, unspecified: Secondary | ICD-10-CM

## 2019-07-02 NOTE — Progress Notes (Signed)
Electrophysiology TeleHealth Note   Due to national recommendations of social distancing due to COVID 19, an audio/video telehealth visit is felt to be most appropriate for this patient at this time.  See MyChart message from today for the patient's consent to telehealth for [facility].   Date:  07/02/2019   ID:  Vanessa [name], DOB 12-Sep-1934, MRN [id number]  Location: patient's home  Provider location: [address], [place]  Evaluation Performed: Follow-up visit  PCP:  Tonia Ghent, MD  Cardiologist:    Electrophysiologist:  SK   Chief Complaint: palpitations  History of Present Illness:    Vanessa [name] is a 85 y.o. female who presents via audio/video conferencing for a telehealth visit today.  Since last being seen in our clinic for atrial tachycardia managed with flecainide the patient reports the palpitations are doing pretty well  Some sob but doesn't do much  Memory a problem and kids are helping  Biggest issue has been dizziness and near falls and falls ( broke pelvis earlier)  Almost always positional, worse in showers--indeed sitting now.  DATE PR interval QRSduration Dose  7/19  148 78 0  8/19 168 82 50 F  12/19 200 90 100          The patient denies symptoms of fevers, chills, cough, or new SOB worrisome for COVID 19.    Past Medical History:  Diagnosis Date  . Anemia    treated ~2009 with iron, resolved  . Arthritis   . Cataract    surgery on R catarct 2012  . Chronic systolic CHF (congestive heart failure) ([place])    a. TTE 2012: EF of 45-50%, mild diffuse HK, trivial AI, mild MR, mildly dilated RV with mild reduction of RVSF, mild to moderate TR, mild to moderate PR, mildly elevated PASP  . COPD (chronic obstructive pulmonary disease) ([place])   . Family history of adverse reaction to anesthesia    Mother - altered mental status (long term)  . Glaucoma   . History of pelvic fracture   . Hyperglycemia    mild inc in  fasting sugar  . Hyperlipidemia   . Hypothyroidism   . Insomnia   . Migraines    without aura, sx since age 13  . Osteopenia    prev with 10 years of fosamax and intolerant of evista  . PAF (paroxysmal atrial fibrillation) ([place])    a. initially noted 2012; b. CHADS2VASc at least 5 (CHF, age x 2, vasacular disease, female)  . Pulmonary hypertension ([place])   . Wears dentures    full upper and lower    Past Surgical History:  Procedure Laterality Date  . CATARACT EXTRACTION    . CATARACT EXTRACTION W/PHACO Left 08/27/2016   Procedure: CATARACT EXTRACTION PHACO AND INTRAOCULAR LENS PLACEMENT ([place])  Left;  Surgeon: Leandrew Koyanagi, MD;  Location: [facility];  Service: Ophthalmology;  Laterality: Left;  . COLONOSCOPY  2012  . REFRACTIVE SURGERY      Current Outpatient Medications  Medication Sig Dispense Refill  . acetaminophen (TYLENOL) 500 MG tablet Take 500-1,000 mg by mouth every 8 (eight) hours as needed.    . Cholecalciferol (VITAMIN D) 50 MCG (2000 UT) CAPS Take 1 capsule (2,000 Units total) by mouth daily.    . Cranberry 1000 MG CAPS Take by mouth daily.    . cyanocobalamin (,VITAMIN B-12,) 1000 MCG/ML injection 1050mcg IM monthly 10 mL 0  . fish oil-omega-3 fatty acids 1000  MG capsule Take 1 g by mouth daily.      . flecainide (TAMBOCOR) 50 MG tablet Take 1.5 tablets (75 mg total) by mouth 2 (two) times daily. Please call to schedule appointment for further refills. Thank you! 90 tablet 1  . Insulin Syringe-Needle U-100 (B-D INSULIN SYRINGE 1CC/25GX1") 25G X 1" 1 ML MISC Use with B12 injection 10 each 1  . latanoprost (XALATAN) 0.005 % ophthalmic solution Place 1 drop into both eyes at bedtime.    Marland Kitchen levothyroxine (SYNTHROID) 50 MCG tablet Take 1 tablet (50 mcg total) by mouth every morning. 90 tablet 1  . Multiple Vitamin (MULTIVITAMIN) tablet Take 1 tablet by mouth daily.    . traMADol (ULTRAM) 50 MG tablet TAKE 1 AND 1/2 TABLETS BY MOUTH THREE TIMES DAILY 405  tablet 1   No current facility-administered medications for this visit.    Allergies:   Codeine, Cortisone, Decongestant [pseudoephedrine hcl], Naproxen, and Simvastatin   Social History:  The patient  reports that she has quit smoking. Her smoking use included cigarettes. She has a 12.50 pack-year smoking history. She has never used smokeless tobacco. She reports that she does not drink alcohol or use drugs.   Family History:  The patient's   family history includes Diabetes in her father; Migraines in her mother; Stroke in her mother.   ROS:  Please see the history of present illness.   All other systems are personally reviewed and negative.    Exam:    Vital Signs:  BP 122/64   Pulse 76   Ht 5' (1.524 m)   Wt 117 lb (53.1 kg)   BMI 22.85 kg/m     Well appearing, alert and conversant, regular work of breathing,  good skin color Eyes- anicteric, neuro- grossly intact, skin- no apparent rash or lesions or cyanosis, mouth- oral mucosa is pink   Labs/Other Tests and Data Reviewed:    Recent Labs: 03/16/2019: ALT 7; BUN 15; Creatinine, Ser 0.87; Hemoglobin 13.6; Platelets 270.0; Potassium 4.9; Sodium 141; TSH 1.64   Wt Readings from Last 3 Encounters:  07/02/19 117 lb (53.1 kg)  06/15/19 117 lb (53.1 kg)  03/16/19 107 lb 3 oz (48.6 kg)     Other studies personally reviewed: Additional studies/ records that were reviewed today include:As above See Below     ASSESSMENT & PLAN:    Atrial tachycardia   Orthostatic dizziness and Hypotension  Falls    Tolerating the flecainide and largely effective for her atrial tachycardia Her dizziness and orthostatic LH are concerning and the 82mm drop in BP concerning ( down to 80's)  Discussed the situation afterwards with her daughter, pharmacological v non pharmacological treatment-- have elected for the latter given the potential for sideeffects  Abdominal binder and thigh sleeves-- may be a challenge in this older woman with  dementia  May end up needing med support and at that point with lowish BP lean towards florinef for simplicity sake    COVID 19 screen The patient denies symptoms of COVID 19 at this time.  The importance of social distancing was discussed today.  Follow-up:  4-6 w telehealth visit      Current medicines are reviewed at length with the patient today.   The patient does not have concerns regarding her medicines.  The following changes were made today:  none  Labs/ tests ordered today include:   No orders of the defined types were placed in this encounter.   Future tests ( post COVID )  Patient Risk:  after full review of this patients clinical status, I feel that they are at moderate*  risk at this time.  Today, I have spent 13+15 minutes with the patient with telehealth technology discussing the above.  Signed, Sherryl Manges, MD  07/02/2019 9:19 AM     [facility] [address] [place] [address] [phone] (office) [phone] (fax)

## 2019-07-04 ENCOUNTER — Other Ambulatory Visit: Payer: Self-pay | Admitting: Family Medicine

## 2019-07-06 NOTE — Telephone Encounter (Signed)
Electronic refill request. Tramadol Last office visit:  06/15/2019 Last Filled:     405 tablet 1 01/02/2019  Please advise.

## 2019-07-07 NOTE — Telephone Encounter (Signed)
Sent. Thanks.   

## 2019-07-08 NOTE — Patient Instructions (Addendum)
Medication Instructions:  - Your physician recommends that you continue on your current medications as directed. Please refer to the Current Medication list given to you today.  *If you need a refill on your cardiac medications before your next appointment, please call your pharmacy*   Lab Work: - none ordered  If you have labs (blood work) drawn today and your tests are completely normal, you will receive your results only by: Marland Kitchen MyChart Message (if you have MyChart) OR . A paper copy in the mail If you have any lab test that is abnormal or we need to change your treatment, we will call you to review the results.   Testing/Procedures: - none ordered   Follow-Up: At [facility], you and your health needs are our priority.  As part of our continuing mission to provide you with exceptional heart care, we have created designated Provider Care Teams.  These Care Teams include your primary Cardiologist (physician) and Advanced Practice Providers (APPs -  Physician Assistants and Nurse Practitioners) who all work together to provide you with the care you need, when you need it.  We recommend signing up for the patient portal called "MyChart".  Sign up information is provided on this After Visit Summary.  MyChart is used to connect with patients for Virtual Visits (Telemedicine).  Patients are able to view lab/test results, encounter notes, upcoming appointments, etc.  Non-urgent messages can be sent to your provider as well.   To learn more about what you can do with MyChart, go to [email or web address].    Your next appointment:   4-5 weeks   The format for your next appointment:   Virtual Visit   Provider:   Sherryl Manges, MD   Other Instructions Abdominal binder and thigh sleeves for compression support

## 2019-07-22 ENCOUNTER — Telehealth: Payer: Self-pay | Admitting: Internal Medicine

## 2019-07-22 NOTE — Telephone Encounter (Signed)
Patient daughter Vanessa [name] calling States that patient has not been compliant with Dr Odessa Fleming insturctions Would like to speak with nurse to see if it will be necessary to keep upcoming virtual appointment  Please call to discuss [phone]

## 2019-07-23 NOTE — Telephone Encounter (Signed)
I spoke with the patient's daughter, Vanessa [name]. She states the patient did try leg wraps for 1 day, but will not wear these. She has worn the abdominal binder a few times and states she feels good when she does, but will not wear this consistently.  Vanessa [name] feels the appointment set for next week may not be of much benefit to the patient.  I advised her I had spoken with Dr. Graciela Husbands earlier today about her call and he was fine with cancelling that and just stepping back to be of service when needed.  I advised Vanessa [name] we will need to at least see the patient once a year to refill her flecainide.  I advised we could try to set an appointment up for 6 months from now if she would like just to touch base. She is aware she can let us know closer to time if they need to push that appointment out further.  Vanessa [name] voices understanding and is agreeable. She is aware we will set this up for an in person visit due to the patient being on flecainide and needing an EKG.  Vanessa [name] will touch base closer to the time of the appointment if the patient's dementia worsens a lot more and it is not convenient to bring her in.

## 2019-07-23 NOTE — Telephone Encounter (Signed)
I attempted to call the patient's daughter. No answer- I left a message to please call back.

## 2019-07-30 ENCOUNTER — Telehealth: Payer: PPO | Admitting: Internal Medicine

## 2019-08-04 ENCOUNTER — Other Ambulatory Visit: Payer: Self-pay | Admitting: Internal Medicine

## 2019-08-04 NOTE — Telephone Encounter (Signed)
*  STAT* If patient is at the pharmacy, call can be transferred to refill team.   1. Which medications need to be refilled? (please list name of each medication and dose if known) flecainide 50 mg (1.5 tablets bid)  2. Which pharmacy/location (including street and city if local pharmacy) is medication to be sent to? [facility]  3. Do they need a 30 day or 90 day supply? 90

## 2019-08-05 MED ORDER — FLECAINIDE ACETATE 50 MG PO TABS: 75.0000 mg | ORAL_TABLET | Freq: Two times a day (BID) | ORAL | 0 refills | Status: DC

## 2019-08-05 NOTE — Telephone Encounter (Signed)
Requested Prescriptions   Signed Prescriptions Disp Refills   flecainide (TAMBOCOR) 50 MG tablet 270 tablet 0    Sig: Take 1.5 tablets (75 mg total) by mouth 2 (two) times daily. Please call to schedule appointment for further refills. Thank you!    Authorizing Provider: Duke Salvia    Ordering User: [facility] Headings, Maris Abascal L

## 2019-08-22 IMAGING — CT CT CHEST W/ CM
2 of 4 series · 15 of 36 positions shown, 18 images · IV contrast (iopamidol)
Comparison: None.

CLINICAL DATA: 82-year-old female losing weight. Dizziness. Smoker.
Initial encounter.

EXAM:
CT CHEST WITH CONTRAST
TECHNIQUE: Multidetector CT imaging of the chest was performed during
intravenous contrast administration.
CONTRAST:  75mL KGWFPW-V88 IOPAMIDOL (KGWFPW-V88) INJECTION 61%

[Series 2: axial chest · axial · 0.54mm/px · z∈[-1170,-892]mm · 12 of 165 slices shown, 15 images]
[im 13/165  mediastinal]
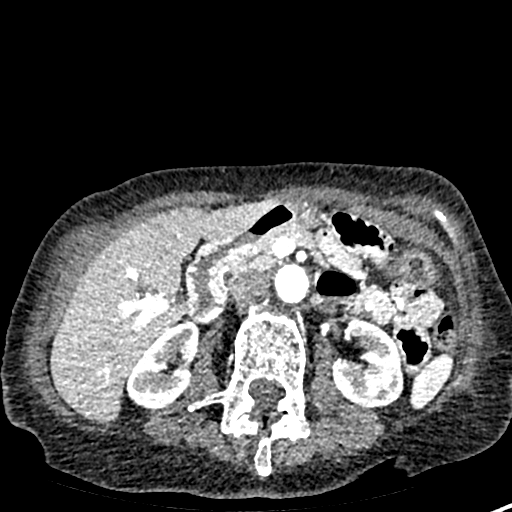
[im 13/165  lung]
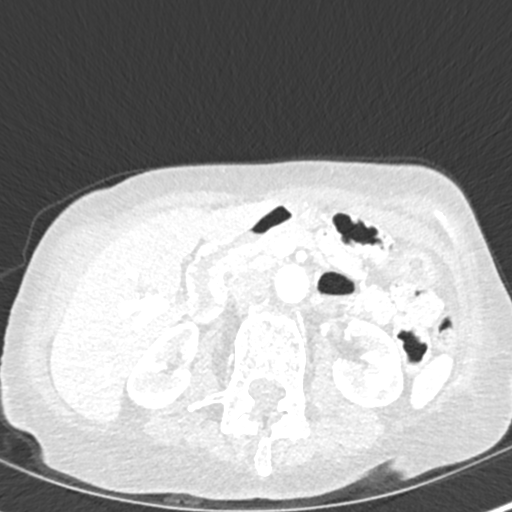
[im 26/165  lung]
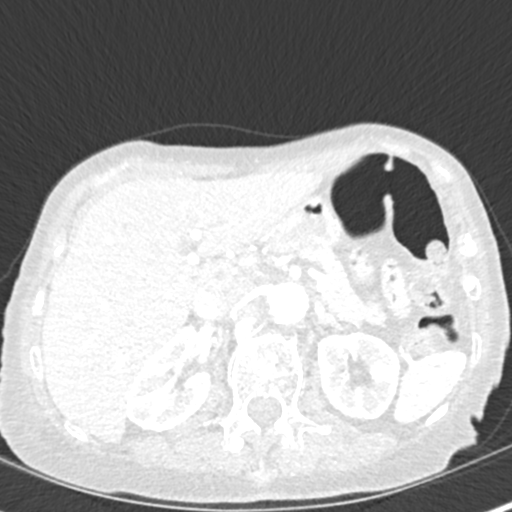
[im 38/165  lung]
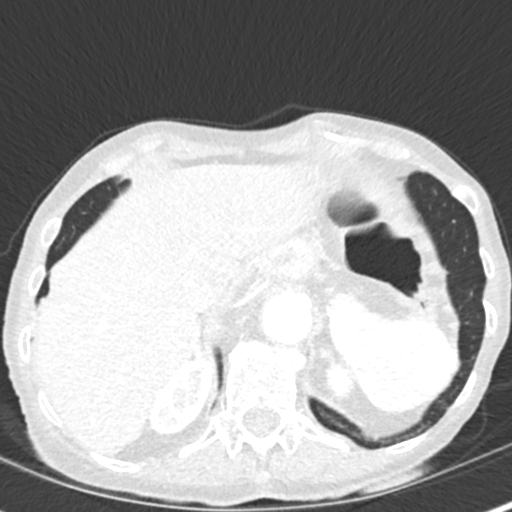
[im 51/165  lung]
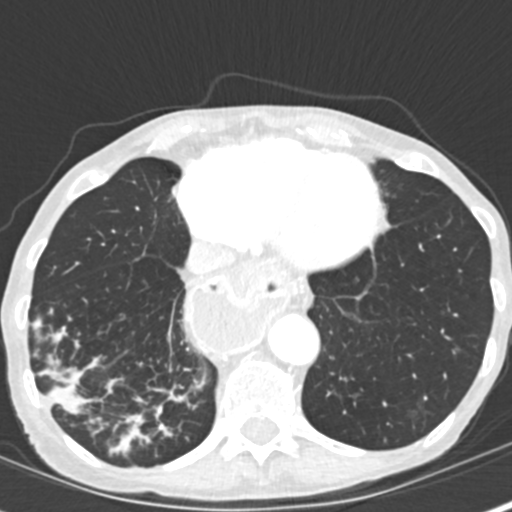
[im 64/165  mediastinal]
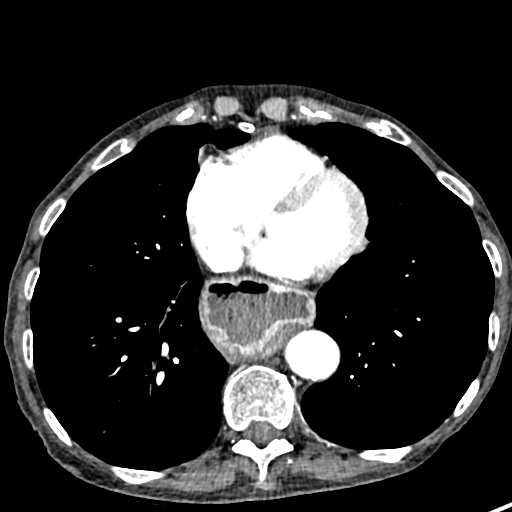
[im 64/165  lung]
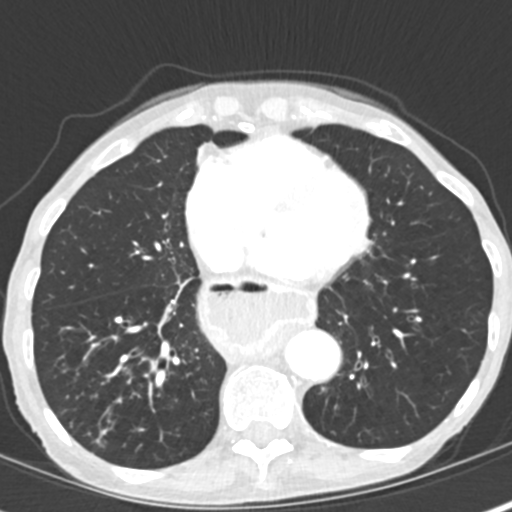
[im 76/165  lung]
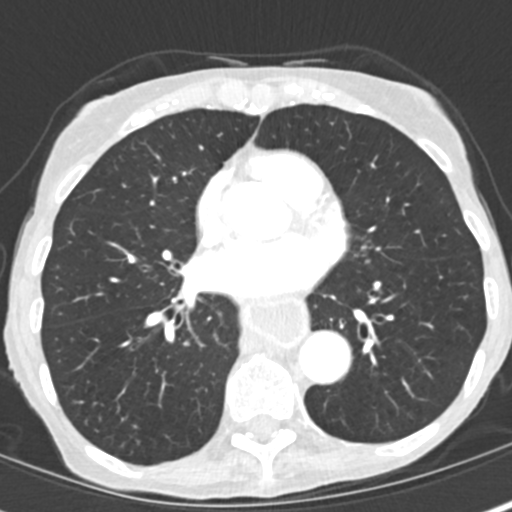
[im 89/165  lung]
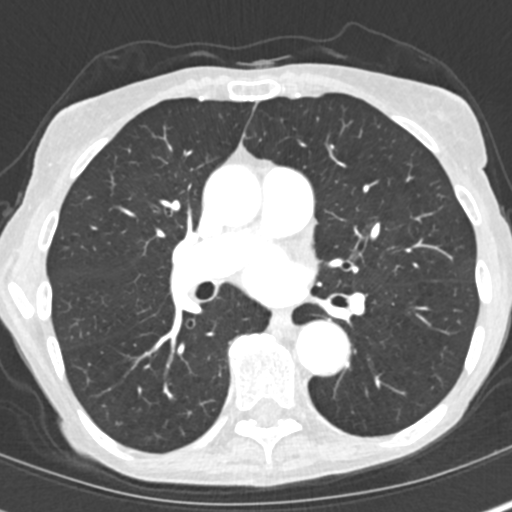
[im 101/165  lung]
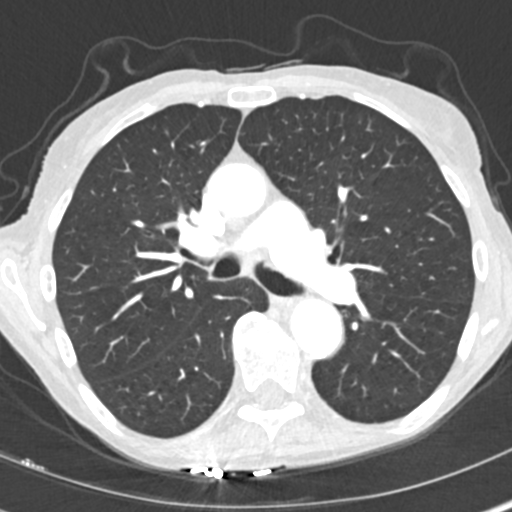
[im 114/165  mediastinal]
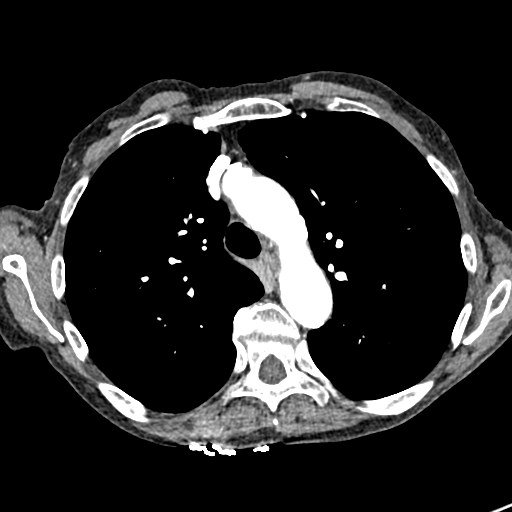
[im 114/165  lung]
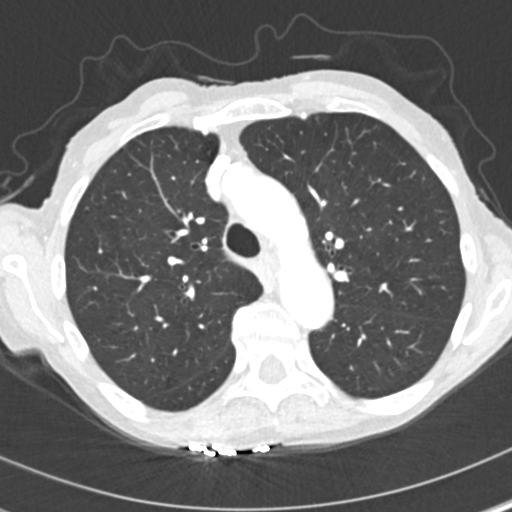
[im 127/165  lung]
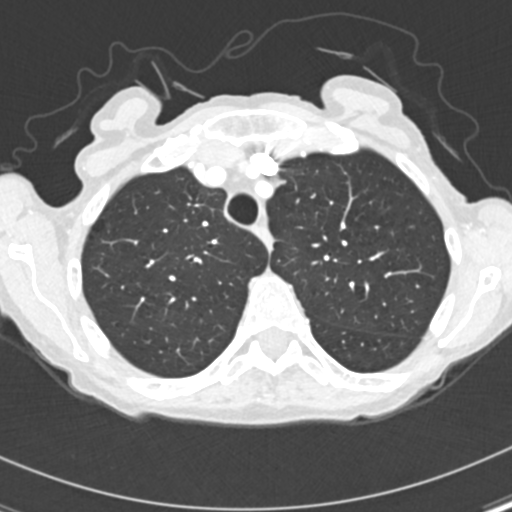
[im 139/165  lung]
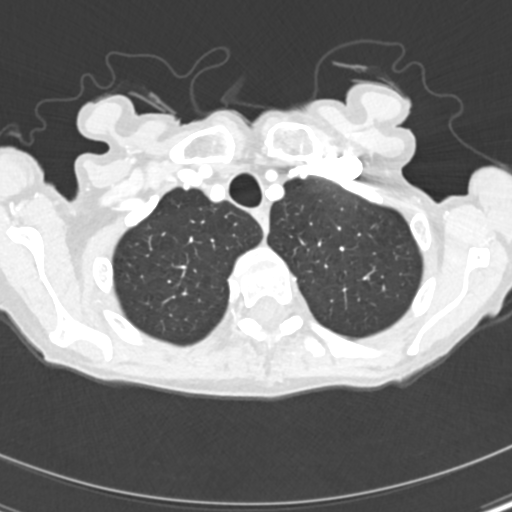
[im 152/165  lung]
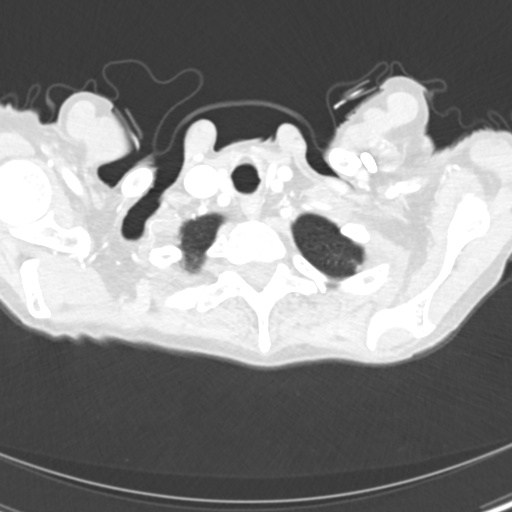

[Series 4: coronal chest · coronal · 0.54mm/px · 3 of 124 slices shown]
[im 25/124  lung]
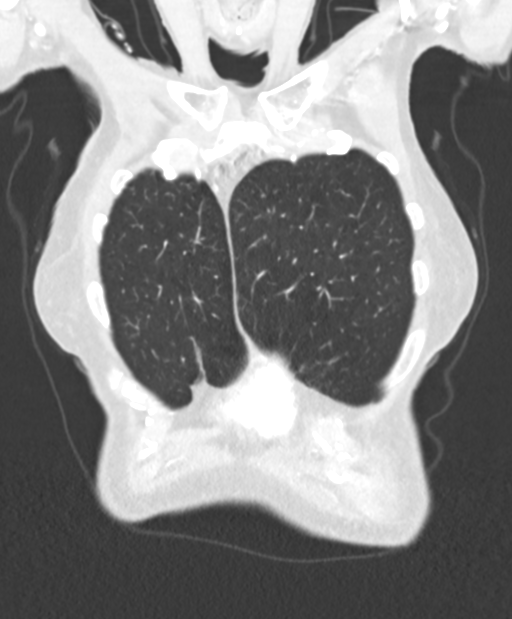
[im 50/124  lung]
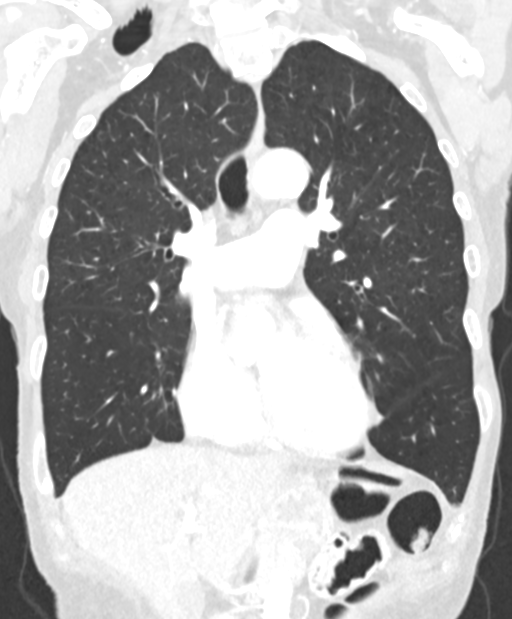
[im 74/124  lung]
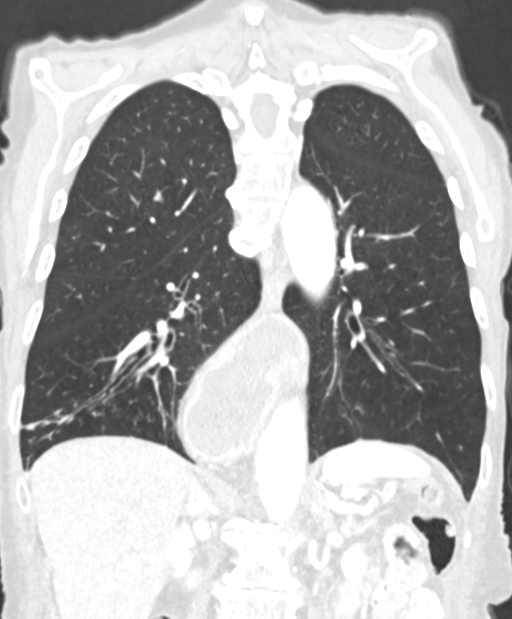

[15 of 36 positions shown; findings below may reference images not displayed]

FINDINGS: Cardiovascular: No pulmonary embolus. Atherosclerotic changes
thoracic aorta great vessels and upper abdominal aorta.

Heart size within normal limits. Coronary artery calcification.
Aortic valve calcification.

Mediastinum/Nodes: Normal size mediastinal lymph nodes. No hilar
adenopathy.

Large hiatal hernia.

Lungs/Pleura: Right lower lobe band like opacities with
peribronchial thickening.

Left lower lobe 3.3 mm nodule (series 3, image 120).

Hyperinflated lungs may reflect changes of underlying emphysema.

Upper Abdomen: 1.2 cm right lobe liver cyst. 1 cm enhancing lesion
right lobe liver may represent transient hepatic attenuation
difference. Focal fatty infiltration adjacent to the fissure for the
falciform ligament.

Musculoskeletal: Mild kyphosis/scoliosis thoracic spine with
superimposed degenerative changes. No osseous destructive lesion.
IMPRESSION: Right lower lobe band like opacities with peribronchial thickening.
This may be related to aspiration in this patient who was a
moderately large hiatal hernia. This can be reassessed on follow-up
non-contrast chest CT in 3 months (at which time high-resolution
images can be obtained to evaluate for emphysema).

Left lower lobe 3.3 mm nodule (series 3, image 120).

Aortic Atherosclerosis (VRIV2-TTN.N).

Hyperinflated lungs suggestive of underlying emphysema.

1 cm enhancing lesion right lobe liver may represent transient
hepatic attenuation difference.

## 2019-11-02 ENCOUNTER — Other Ambulatory Visit: Payer: Self-pay | Admitting: Internal Medicine

## 2019-11-02 MED ORDER — FLECAINIDE ACETATE 50 MG PO TABS: 75.0000 mg | ORAL_TABLET | Freq: Two times a day (BID) | ORAL | 0 refills | Status: DC

## 2019-11-02 NOTE — Telephone Encounter (Signed)
°*  STAT* If patient is at the pharmacy, call can be transferred to refill team.   1. Which medications need to be refilled? (please list name of each medication and dose if known) flecainide 50 MG   2. Which pharmacy/location (including street and city if local pharmacy) is medication to be sent to? [facility]   3. Do they need a 30 day or 90 day supply? 90 day

## 2019-11-02 NOTE — Telephone Encounter (Signed)
Requested Prescriptions   Signed Prescriptions Disp Refills   flecainide (TAMBOCOR) 50 MG tablet 270 tablet 0    Sig: Take 1.5 tablets (75 mg total) by mouth 2 (two) times daily.    Authorizing Provider: Duke Salvia    Ordering User: Thayer Headings, Jiayi Lengacher L

## 2019-11-03 ENCOUNTER — Other Ambulatory Visit: Payer: Self-pay

## 2019-11-03 ENCOUNTER — Other Ambulatory Visit (INDEPENDENT_AMBULATORY_CARE_PROVIDER_SITE_OTHER): Payer: PPO

## 2019-11-03 DIAGNOSIS — E538 Deficiency of other specified B group vitamins: Secondary | ICD-10-CM

## 2019-11-03 LAB — VITAMIN B12: Vitamin B-12: 724 pg/mL (ref 211–911)

## 2019-11-10 ENCOUNTER — Other Ambulatory Visit: Payer: Self-pay | Admitting: Family Medicine

## 2019-11-10 DIAGNOSIS — E538 Deficiency of other specified B group vitamins: Secondary | ICD-10-CM

## 2019-11-10 NOTE — Telephone Encounter (Signed)
Electronic refill request. Cyanocobalamin Last office visit:   06/15/2019 Last Filled:    10 mL 0 06/21/2019  Please advise. Last B12 level 724 on 11/03/2019

## 2019-11-11 NOTE — Telephone Encounter (Signed)
Sent. Thanks.   

## 2019-11-19 ENCOUNTER — Other Ambulatory Visit: Payer: Self-pay

## 2020-01-18 ENCOUNTER — Telehealth: Payer: Self-pay | Admitting: Internal Medicine

## 2020-01-18 NOTE — Telephone Encounter (Signed)
GM2U Once a year is fine and if her dementia is bad then we should have a conversation about whether to continue or not Thanks

## 2020-01-18 NOTE — Telephone Encounter (Signed)
To Dr. Graciela Husbands to review. I spoke with the patient's daughter in late March/ early April. The patient is suffering from dementia. The only reason to really see her a this point would be to fill her flecainide. Will forward to Dr. Graciela Husbands for further recommendations.

## 2020-01-18 NOTE — Telephone Encounter (Signed)
Patient daughter calling to discuss timeframe for rescheduling visit.    Patient has had no change since last visit no issues and no needs to discuss at this time.    She still does not qualify for covid vaccine and given the risks wants to know if appt can be pushed out to 1 year or even as needed.   Please advise

## 2020-01-18 NOTE — Telephone Encounter (Signed)
I called and spoke with the patient's daughter, [name]. I advised her of Dr. Odessa Fleming recommendations as stated below. Per [name], the patient's dementia is slightly worse, but she is still aware of when her heart may not feel quite right.  [name] would like the patient to continue flecainide at this point.   I advised [name] we will need to see the patient in office for an EKG in March 2022. I have also advised her if the patient's dementia worsens over the next 6 months, she may call us prior to her appointment and r/s to a virtual visit if she is wanting to discuss the patient coming off of flecainide.   [name] voices understanding and is agreeable.   Appt made for Thursday 06/30/20 at 9:00 am for an in office appointment.

## 2020-01-28 ENCOUNTER — Ambulatory Visit: Payer: PPO | Admitting: Internal Medicine

## 2020-02-01 ENCOUNTER — Other Ambulatory Visit: Payer: Self-pay | Admitting: Internal Medicine

## 2020-03-01 ENCOUNTER — Other Ambulatory Visit: Payer: Self-pay | Admitting: Family Medicine

## 2020-03-22 ENCOUNTER — Other Ambulatory Visit: Payer: Self-pay

## 2020-03-22 ENCOUNTER — Ambulatory Visit (INDEPENDENT_AMBULATORY_CARE_PROVIDER_SITE_OTHER): Payer: PPO | Admitting: Family Medicine

## 2020-03-22 ENCOUNTER — Encounter: Payer: Self-pay | Admitting: Family Medicine

## 2020-03-22 VITALS — BP 140/80 | HR 110 | Temp 98.0°F | Ht 60.0 in | Wt 128.0 lb

## 2020-03-22 DIAGNOSIS — M1990 Unspecified osteoarthritis, unspecified site: Secondary | ICD-10-CM | POA: Diagnosis not present

## 2020-03-22 DIAGNOSIS — E039 Hypothyroidism, unspecified: Secondary | ICD-10-CM | POA: Diagnosis not present

## 2020-03-22 DIAGNOSIS — E538 Deficiency of other specified B group vitamins: Secondary | ICD-10-CM | POA: Diagnosis not present

## 2020-03-22 DIAGNOSIS — R413 Other amnesia: Secondary | ICD-10-CM | POA: Diagnosis not present

## 2020-03-22 DIAGNOSIS — M8580 Other specified disorders of bone density and structure, unspecified site: Secondary | ICD-10-CM

## 2020-03-22 DIAGNOSIS — Z0000 Encounter for general adult medical examination without abnormal findings: Secondary | ICD-10-CM

## 2020-03-22 DIAGNOSIS — I4891 Unspecified atrial fibrillation: Secondary | ICD-10-CM | POA: Diagnosis not present

## 2020-03-22 DIAGNOSIS — E782 Mixed hyperlipidemia: Secondary | ICD-10-CM | POA: Diagnosis not present

## 2020-03-22 DIAGNOSIS — Z7189 Other specified counseling: Secondary | ICD-10-CM

## 2020-03-22 LAB — COMPREHENSIVE METABOLIC PANEL
ALT: 9 U/L (ref 0–35)
AST: 19 U/L (ref 0–37)
Albumin: 3.7 g/dL (ref 3.5–5.2)
Alkaline Phosphatase: 42 U/L (ref 39–117)
BUN: 11 mg/dL (ref 6–23)
CO2: 33 mEq/L — ABNORMAL HIGH (ref 19–32)
Calcium: 8.8 mg/dL (ref 8.4–10.5)
Chloride: 104 mEq/L (ref 96–112)
Creatinine, Ser: 0.71 mg/dL (ref 0.40–1.20)
GFR: 78.61 mL/min (ref >60.00)
Glucose, Bld: 113 mg/dL — ABNORMAL HIGH (ref 70–99)
Potassium: 3.4 mEq/L — ABNORMAL LOW (ref 3.5–5.1)
Sodium: 144 mEq/L (ref 135–145)
Total Bilirubin: 0.7 mg/dL (ref 0.2–1.2)
Total Protein: 6.3 g/dL (ref 6.0–8.3)

## 2020-03-22 LAB — CBC WITH DIFFERENTIAL/PLATELET
Basophils Absolute: 0 10*3/uL (ref 0.0–0.1)
Basophils Relative: 0.5 % (ref 0.0–3.0)
Eosinophils Absolute: 0.3 10*3/uL (ref 0.0–0.7)
Eosinophils Relative: 3.5 % (ref 0.0–5.0)
HCT: 38.8 % (ref 36.0–46.0)
Hemoglobin: 13.1 g/dL (ref 12.0–15.0)
Lymphocytes Relative: 10.2 % — ABNORMAL LOW (ref 12.0–46.0)
Lymphs Abs: 0.9 10*3/uL (ref 0.7–4.0)
MCHC: 33.6 g/dL (ref 30.0–36.0)
MCV: 93.1 fl (ref 78.0–100.0)
Monocytes Absolute: 0.6 10*3/uL (ref 0.1–1.0)
Monocytes Relative: 6.5 % (ref 3.0–12.0)
Neutro Abs: 6.9 10*3/uL (ref 1.4–7.7)
Neutrophils Relative %: 79.3 % — ABNORMAL HIGH (ref 43.0–77.0)
Platelets: 331 10*3/uL (ref 150.0–400.0)
RBC: 4.17 Mil/uL (ref 3.87–5.11)
RDW: 13.4 % (ref 11.5–15.5)
WBC: 8.6 10*3/uL (ref 4.0–10.5)

## 2020-03-22 LAB — TSH: TSH: 0.07 u[IU]/mL — ABNORMAL LOW (ref 0.35–4.50)

## 2020-03-22 LAB — LIPID PANEL
Cholesterol: 200 mg/dL (ref 0–200)
HDL: 38.9 mg/dL — ABNORMAL LOW (ref >39.00)
LDL Cholesterol: 130 mg/dL — ABNORMAL HIGH (ref 0–99)
NonHDL: 161.52
Total CHOL/HDL Ratio: 5
Triglycerides: 160 mg/dL — ABNORMAL HIGH (ref 0.0–149.0)
VLDL: 32 mg/dL (ref 0.0–40.0)

## 2020-03-22 LAB — VITAMIN D 25 HYDROXY (VIT D DEFICIENCY, FRACTURES): VITD: 48.39 ng/mL (ref 30.00–100.00)

## 2020-03-22 LAB — VITAMIN B12: Vitamin B-12: 1524 pg/mL — ABNORMAL HIGH (ref 211–911)

## 2020-03-22 MED ORDER — TRAMADOL HCL 50 MG PO TABS: ORAL_TABLET | ORAL | 1 refills | Status: DC

## 2020-03-22 NOTE — Patient Instructions (Signed)
Don't change your meds for now.  Go to the lab on the way out.   If you have mychart we'll likely use that to update you.    Let me update cardiology and we'll go from there.  Take care.  Glad to see you.

## 2020-03-22 NOTE — Progress Notes (Signed)
This visit occurred during the SARS-CoV-2 public health emergency.  Safety protocols were in place, including screening questions prior to the visit, additional usage of staff PPE, and extensive cleaning of exam room while observing appropriate contact time as indicated for disinfecting solutions.  I have personally reviewed the Medicare Annual Wellness questionnaire and have noted 1. The patient's medical and social history 2. Their use of alcohol, tobacco or illicit drugs 3. Their current medications and supplements 4. The patient's functional ability including ADL's, fall risks, home safety risks and hearing or visual             impairment. 5. Diet and physical activities 6. Evidence for depression or mood disorders  The patients weight, height, BMI have been recorded in the chart and visual acuity is per eye clinic.  I have made referrals, counseling and provided education to the patient based review of the above and I have provided the pt with a written personalized care plan for preventive services.  Provider list updated- see scanned forms.  Routine anticipatory guidance given to patient.  See health maintenance. The possibility exists that previously documented standard health maintenance information may have been brought forward from a previous encounter into this note.  If needed, that same information has been updated to reflect the current situation based on today's encounter.    Flu 2021 Shingles discussed with patient PNA up-to-date Tetanus 2013 covid 2021 Colon cancer screening not due given her age. Breast cancer screening d/w pt,  She'll consider.  Bone density test declined by patient. Advance directive-daughter Steward Drone designated if patient were incapacitated, patient would like [name] to get input from her other 3 siblings prior to making decisions. Cognitive function addressed- see scanned forms- and if abnormal then additional documentation follows.    Memory  change.  She has help in the AMs with morning meds, help coming into the home and family checking on patient in the afternoons, 7 days a week.  Family and patient were comfortable with the arrangements.  Patient and family noted gradual changes.  She has a fall button.  We talked about curtailing driving.  I would like her to be able to go out and be active in the community but it likely makes sense for her to use the help coming in as a driver.  I asked her to consider this.  Tachycardia noted on intake.  Her pulse rate clearly improved by the time I was examining her with a heart rate in the 70s.  It dipped down into the 50s when we got her EKG done.  She has been occasionally lightheaded.  No syncope.  B12 injection monthly at home.  Due for labs.  See notes on follow-up labs.  Still on tramadol at baseline.  It helped with joint pain and migraines.  No ADE on med.  Compliant.  Hypothyroidism.  No neck mass.  No dysphagia.  TSh pending.  Compliant with levothyroxine.  She had a cold recently, over the last few weeks, better in the meantime.  No sputum.  Some occ wheeze, more with the cold.  Family thought she was a little SOB but patient didn't complain of that today.  Overall she felt like she was getting better.  PMH and SH reviewed  Meds, vitals, and allergies reviewed.   ROS: Per HPI.  Unless specifically indicated otherwise in HPI, the patient denies:  General: fever. Eyes: acute vision changes ENT: sore throat Cardiovascular: chest pain Respiratory: SOB GI: vomiting GU: dysuria  Musculoskeletal: acute back pain Derm: acute rash Neuro: acute motor dysfunction Psych: worsening mood Endocrine: polydipsia Heme: bleeding Allergy: hayfever  GEN: nad, alert and pleasant in conversation but she did not recall the year on memory testing.  She was 3 out of 3 on short-term recall of objects. HEENT: ncat NECK: supple w/o LA CV: rrr. PULM: ctab, no inc wob ABD: soft, +bs EXT: no  edema SKIN: no acute rash

## 2020-03-23 ENCOUNTER — Other Ambulatory Visit: Payer: Self-pay | Admitting: Family Medicine

## 2020-03-23 DIAGNOSIS — E039 Hypothyroidism, unspecified: Secondary | ICD-10-CM

## 2020-03-23 MED ORDER — LEVOTHYROXINE SODIUM 25 MCG PO TABS: 25.0000 ug | ORAL_TABLET | Freq: Every day | ORAL | 3 refills | Status: DC

## 2020-03-23 NOTE — Assessment & Plan Note (Signed)
Memory change noted but we thought it was prudent to check her baseline labs today to look for any reversible causes and address her pulse variation before we added any other medications.  See notes on labs.

## 2020-03-23 NOTE — Assessment & Plan Note (Addendum)
She had a variable heart rate here at the office.  She was not lightheaded here today but has been occasionally lightheaded.  No syncope.  We will check routine labs today and I will update cardiology as FYI.  She sounds regular on my exam.

## 2020-03-23 NOTE — Assessment & Plan Note (Signed)
No neck mass.  No dysphagia.  TSh pending.  Compliant with levothyroxine.

## 2020-03-23 NOTE — Assessment & Plan Note (Signed)
Tramadol helps with joint pain without sedation.  She is used chronically and has been able to manage her prescription without inappropriately dosing the medicine.

## 2020-03-23 NOTE — Assessment & Plan Note (Signed)
Flu 2021 Shingles discussed with patient PNA up-to-date Tetanus 2013 covid 2021 Colon cancer screening not due given her age. Breast cancer screening d/w pt,  She'll consider.  Bone density test declined by patient. Advance directive-daughter Steward Drone designated if patient were incapacitated, patient would like [name] to get input from her other 3 siblings prior to making decisions. Cognitive function addressed- see scanned forms- and if abnormal then additional documentation follows.

## 2020-03-23 NOTE — Assessment & Plan Note (Signed)
Advance directive-daughter Steward Drone designated if patient were incapacitated, patient would like [name] to get input from her other 3 siblings prior to making decisions.

## 2020-03-23 NOTE — Assessment & Plan Note (Signed)
On monthly injection at home and she is approaching her next due dose so this will be a near trough level.

## 2020-03-25 ENCOUNTER — Telehealth: Payer: Self-pay | Admitting: Family Medicine

## 2020-03-25 NOTE — Telephone Encounter (Signed)
Would only make 1 change at a time.  Continue B12, wouldn't start other meds yet.  Thanks.

## 2020-03-25 NOTE — Telephone Encounter (Signed)
Patients daughter called wanting to know if we are continuing her b12 shots and also if  Dr Para March is going to start her on Aricept. Please advise . Patient would like a call back to discuss. EM

## 2020-03-28 ENCOUNTER — Encounter: Payer: Self-pay | Admitting: Family Medicine

## 2020-03-28 ENCOUNTER — Telehealth: Payer: Self-pay

## 2020-03-28 NOTE — Telephone Encounter (Signed)
[place]: [id number] - PA Case ID: [id number] - Rx #: [id number] Need help? Call us at [phone] Status Sent to [facility] traMADol HCl 50MG  tablets Form [employer] Medicare 4-Part Electronic PA Form (2017 NCPDP) Original Claim Info [id number] Provide Beneficiary with CMS Notice of Appeal Rights;ANDA;T1;22.500000 MED/day MORPHINE_EQUIV;MEMBER IS OPIOID NAIVE: SEVEN DAY SUPPLY ALLOWED; CALL FOR OVERRIDE ASSISTANCE OR PA REQUEST.;Dur Reject, interaction lookback;CALL [phone];ENTER CODES IF APPLICABLE - Reason Code = MX, Prof. Code = M0/MR, Result Code = 4C (Hospice)/4D (Cancer)/4J (Not Opioid Naive) For RxLocal Coupon Price of: $144.38 submit to [phone] PCN: CP Group: [facility] --Service provided at no cost and no switch fee to the pharmacy--

## 2020-03-28 NOTE — Telephone Encounter (Signed)
Message sent thru MyChart 

## 2020-03-31 NOTE — Telephone Encounter (Signed)
Fax came in approving tramadol through 04/11/20.  Only number on form is [id number]*CLE

## 2020-05-05 ENCOUNTER — Other Ambulatory Visit: Payer: Self-pay | Admitting: Internal Medicine

## 2020-05-05 NOTE — Telephone Encounter (Signed)
This is a [place] pt 

## 2020-05-05 NOTE — Telephone Encounter (Signed)
Rx request sent to pharmacy.  

## 2020-06-07 ENCOUNTER — Other Ambulatory Visit: Payer: Self-pay

## 2020-06-07 ENCOUNTER — Other Ambulatory Visit (INDEPENDENT_AMBULATORY_CARE_PROVIDER_SITE_OTHER): Payer: PPO

## 2020-06-07 DIAGNOSIS — E039 Hypothyroidism, unspecified: Secondary | ICD-10-CM

## 2020-06-08 LAB — TSH: TSH: 3.42 u[IU]/mL (ref 0.35–4.50)

## 2020-06-29 ENCOUNTER — Other Ambulatory Visit: Payer: Self-pay | Admitting: Family Medicine

## 2020-06-30 ENCOUNTER — Telehealth: Payer: Self-pay | Admitting: Internal Medicine

## 2020-06-30 ENCOUNTER — Ambulatory Visit: Payer: PPO | Admitting: Internal Medicine

## 2020-06-30 ENCOUNTER — Encounter: Payer: Self-pay | Admitting: Internal Medicine

## 2020-06-30 ENCOUNTER — Other Ambulatory Visit: Payer: Self-pay

## 2020-06-30 VITALS — BP 110/70 | HR 76 | Ht 62.0 in | Wt 120.4 lb

## 2020-06-30 DIAGNOSIS — I471 Supraventricular tachycardia: Secondary | ICD-10-CM | POA: Diagnosis not present

## 2020-06-30 DIAGNOSIS — R001 Bradycardia, unspecified: Secondary | ICD-10-CM

## 2020-06-30 DIAGNOSIS — R42 Dizziness and giddiness: Secondary | ICD-10-CM | POA: Diagnosis not present

## 2020-06-30 DIAGNOSIS — H401131 Primary open-angle glaucoma, bilateral, mild stage: Secondary | ICD-10-CM | POA: Diagnosis not present

## 2020-06-30 MED ORDER — FLECAINIDE ACETATE 50 MG PO TABS
50.0000 mg | ORAL_TABLET | Freq: Two times a day (BID) | ORAL | 3 refills | Status: DC
Start: 2020-06-30 — End: 2021-08-21

## 2020-06-30 MED ORDER — FLECAINIDE ACETATE 50 MG PO TABS
75.0000 mg | ORAL_TABLET | Freq: Two times a day (BID) | ORAL | 3 refills | Status: DC
Start: 2020-06-30 — End: 2020-06-30

## 2020-06-30 NOTE — Progress Notes (Signed)
Patient Care Team: Joaquim Nam, MD as PCP - General (Family Medicine) Lockie Mola, MD as Referring Physician (Ophthalmology) Antonieta Iba, MD as Consulting Physician (Cardiology)   HPI  Vanessa [name] is a 86 y.o. female Seen in follow-up for tachycardia.  I saw her 11/12/2017 and failed to write a note.  She had been seen earlier that day by RD-PA.  She had been noted to have "atrial fibrillation "and some degree of bradycardia.  Heart rates in AF were in the 120s and in sinus rhythm were in the 50s.  His zio monitor had demonstrated recurrent episodes of tachycardia up to 170.  We ended up discontinuing her Cardizem and beginning her on flecainide>> review of the strips suggested that it was more of an atrial tachycardia is in atrial fibrillation and we discontinued her Eliquis   She was seen by Dr. Elberta Fortis who discussed with her ablation; she declined.  Has been maintained on flecainide and denies palps  Was having issues of dizziness for orthostatic.  Tried abdominal binder without benefit; however, she ended up stopping as needed tramadol and the symptoms have abated     DATE PR interval QRSduration Dose  7/19  148 78 0  8/19 168 82 50 F  12/19 200 90 75  3/22 240 104 75      Records and Results Reviewed   Past Medical History:  Diagnosis Date  . Anemia    treated ~2009 with iron, resolved  . Arthritis   . Cataract    surgery on R catarct 2012  . Chronic systolic CHF (congestive heart failure) (HCC)    a. TTE 2012: EF of 45-50%, mild diffuse HK, trivial AI, mild MR, mildly dilated RV with mild reduction of RVSF, mild to moderate TR, mild to moderate PR, mildly elevated PASP  . COPD (chronic obstructive pulmonary disease) (HCC)   . Family history of adverse reaction to anesthesia    Mother - altered mental status (long term)  . Glaucoma   . History of pelvic fracture   . Hyperglycemia    mild inc in fasting sugar  . Hyperlipidemia   .  Hypothyroidism   . Insomnia   . Migraines    without aura, sx since age 74  . Osteopenia    prev with 10 years of fosamax and intolerant of evista  . PAF (paroxysmal atrial fibrillation) (HCC)    a. initially noted 2012; b. CHADS2VASc at least 5 (CHF, age x 2, vasacular disease, female)  . Pulmonary hypertension (HCC)   . Wears dentures    full upper and lower    Past Surgical History:  Procedure Laterality Date  . CATARACT EXTRACTION    . CATARACT EXTRACTION W/PHACO Left 08/27/2016   Procedure: CATARACT EXTRACTION PHACO AND INTRAOCULAR LENS PLACEMENT (IOC)  Left;  Surgeon: Lockie Mola, MD;  Location: [facility];  Service: Ophthalmology;  Laterality: Left;  . COLONOSCOPY  2012  . REFRACTIVE SURGERY      Current Meds  Medication Sig  . acetaminophen (TYLENOL) 500 MG tablet Take 500-1,000 mg by mouth every 8 (eight) hours as needed.  Marland Kitchen Apoaequorin (PREVAGEN PO) Take by mouth daily at 2 am.  . Cholecalciferol (VITAMIN D) 50 MCG (2000 UT) CAPS Take 1 capsule (2,000 Units total) by mouth daily.  . Cranberry 1000 MG CAPS Take by mouth daily.  . cyanocobalamin (,VITAMIN B-12,) 1000 MCG/ML injection INJECT 1 ML INTO THE MUSCLE ONCE A MONTH  .  fish oil-omega-3 fatty acids 1000 MG capsule Take 1 g by mouth daily.  . Insulin Syringe-Needle U-100 (B-D INSULIN SYRINGE 1CC/25GX1") 25G X 1" 1 ML MISC Use with B12 injection  . latanoprost (XALATAN) 0.005 % ophthalmic solution Place 1 drop into both eyes at bedtime.  Marland Kitchen levothyroxine (SYNTHROID) 25 MCG tablet Take 1 tablet (25 mcg total) by mouth daily before breakfast.  . Multiple Vitamin (MULTIVITAMIN) tablet Take 1 tablet by mouth daily.  . traMADol (ULTRAM) 50 MG tablet TAKE 1 AND 1/2 TABLETS BY MOUTH THREE TIMES DAILY  . [DISCONTINUED] flecainide (TAMBOCOR) 50 MG tablet TAKE 1 AND 1/2 TABLETS BY MOUTH TWICE DAILY    Allergies  Allergen Reactions  . Codeine     Doesn't remember reaction  . Cortisone     Doesn't remember  reaction  . Decongestant [Pseudoephedrine Hcl] Other (See Comments)    Avoid decongestants due to glaucoma hx.    . Naproxen     Stomach issues  . Simvastatin     aches      Review of Systems negative except from HPI and PMH  Physical Exam BP 110/70 (BP Location: Left Arm, Patient Position: Sitting, Cuff Size: Normal)   Pulse 76   Ht 5\' 2"  (1.575 m)   Wt 120 lb 6 oz (54.6 kg)   SpO2 95%   BMI 22.02 kg/m  Well developed and nourished in no acute distress HENT normal Neck supple with JVP-  flat   Clear Regular rate and rhythm, no murmurs or gallops Abd-soft with active BS No Clubbing cyanosis edema Skin-warm and dry A & Oriented X2  year 2002, cannot name the president but can pick him out grossly normal sensory and motor function  ECG ECG demonstrates sinus rhythm at 76   26/10/45   Assessment and  Plan  Atrial tach  Sinus brady  Dizziness-chronic  Balance concerns  QRS widening on flecainide   Palpitations have been quiescient.  However, QRS widening on flecainide will prompt 28/10/45 to decrease the dose from 75--50 twice daily.  Previously this was associated with a reasonable conduction slowing  Dizziness has abated off the tramadol.  Family is doing heroes work with 4 children alternating night call

## 2020-06-30 NOTE — Telephone Encounter (Signed)
Pharmacy requests refill on: Tramadol 50 mg   LAST REFILL: 03/22/2020 (Q-405, R-1) LAST OV: 03/22/2020 NEXT OV: Not Scheduled  PHARMACY: [facility]

## 2020-06-30 NOTE — Telephone Encounter (Signed)
The patient was seen in clinic today with Dr. Graciela Husbands. He advised, after the patient & her daughter left, that he was reviewing her EKG again from today and determined she needed to decrease the dose of her flecainide.  He would like her to decrease flecainide to 50 mg BID.  I attempted to call the patient's daughter, [name] (ok per DPR).  No answer- I left a message to please call back.  I have notified [facility] of the medication change.

## 2020-06-30 NOTE — Patient Instructions (Addendum)
Medication Instructions:  - Your physician has recommended you make the following change in your medication:   1) DECREASE flecainide 50 mg- take 1 tablet (50 mg)  by mouth TWICE daily    *If you need a refill on your cardiac medications before your next appointment, please call your pharmacy*   Lab Work: - none ordered  If you have labs (blood work) drawn today and your tests are completely normal, you will receive your results only by: Marland Kitchen MyChart Message (if you have MyChart) OR . A paper copy in the mail If you have any lab test that is abnormal or we need to change your treatment, we will call you to review the results.   Testing/Procedures: - none orderd   Follow-Up: At [facility], you and your health needs are our priority.  As part of our continuing mission to provide you with exceptional heart care, we have created designated Provider Care Teams.  These Care Teams include your primary Cardiologist (physician) and Advanced Practice Providers (APPs -  Physician Assistants and Nurse Practitioners) who all work together to provide you with the care you need, when you need it.  We recommend signing up for the patient portal called "MyChart".  Sign up information is provided on this After Visit Summary.  MyChart is used to connect with patients for Virtual Visits (Telemedicine).  Patients are able to view lab/test results, encounter notes, upcoming appointments, etc.  Non-urgent messages can be sent to your provider as well.   To learn more about what you can do with MyChart, go to [email or web address].    Your next appointment:   1 year(s)  The format for your next appointment:   In Person  Provider:   Sherryl Manges, MD   Other Instructions n/a

## 2020-06-30 NOTE — Addendum Note (Signed)
Addended bySherri Rad C on: 06/30/2020 10:05 AM   Modules accepted: Orders

## 2020-07-04 NOTE — Telephone Encounter (Signed)
Late entry: the patient's daughter, [name], did call back to the office on Thursday 06/30/20. I was able to advise her of Dr. Odessa Fleming recommendations to decrease flecainide to 50 mg - take 1 tablet BID.  [name] voiced understanding.  A RX was sent to [facility] with these changes.

## 2020-08-30 ENCOUNTER — Other Ambulatory Visit: Payer: Self-pay | Admitting: Family Medicine

## 2020-08-30 DIAGNOSIS — E538 Deficiency of other specified B group vitamins: Secondary | ICD-10-CM

## 2020-08-31 NOTE — Telephone Encounter (Signed)
Would continue, sent. Thanks.  

## 2020-08-31 NOTE — Telephone Encounter (Signed)
Refill request for B12; okay for patient to continue?

## 2021-01-02 DIAGNOSIS — H401131 Primary open-angle glaucoma, bilateral, mild stage: Secondary | ICD-10-CM | POA: Diagnosis not present

## 2021-03-23 ENCOUNTER — Ambulatory Visit: Payer: PPO

## 2021-03-24 ENCOUNTER — Encounter: Payer: PPO | Admitting: Family Medicine

## 2021-04-04 ENCOUNTER — Encounter: Payer: PPO | Admitting: Family Medicine

## 2021-04-04 ENCOUNTER — Other Ambulatory Visit: Payer: Self-pay | Admitting: Family Medicine

## 2021-04-04 DIAGNOSIS — E039 Hypothyroidism, unspecified: Secondary | ICD-10-CM

## 2021-04-04 NOTE — Telephone Encounter (Signed)
Refill request for tramadol 50 mg tablet  LOV - 03/22/20 Next OV - 05/04/21 Last refill - 03/22/20 #405/1  *Also needs levothyroxine 25 mcg refilled

## 2021-04-05 NOTE — Telephone Encounter (Signed)
Sent. Thanks.   

## 2021-05-04 ENCOUNTER — Other Ambulatory Visit: Payer: Self-pay

## 2021-05-04 ENCOUNTER — Ambulatory Visit (INDEPENDENT_AMBULATORY_CARE_PROVIDER_SITE_OTHER): Payer: PPO | Admitting: Family Medicine

## 2021-05-04 ENCOUNTER — Encounter: Payer: Self-pay | Admitting: Family Medicine

## 2021-05-04 VITALS — BP 118/84 | HR 78 | Temp 97.4°F | Ht 62.0 in | Wt 128.0 lb

## 2021-05-04 DIAGNOSIS — E782 Mixed hyperlipidemia: Secondary | ICD-10-CM

## 2021-05-04 DIAGNOSIS — R413 Other amnesia: Secondary | ICD-10-CM

## 2021-05-04 DIAGNOSIS — E039 Hypothyroidism, unspecified: Secondary | ICD-10-CM

## 2021-05-04 DIAGNOSIS — E538 Deficiency of other specified B group vitamins: Secondary | ICD-10-CM | POA: Diagnosis not present

## 2021-05-04 DIAGNOSIS — G43009 Migraine without aura, not intractable, without status migrainosus: Secondary | ICD-10-CM | POA: Diagnosis not present

## 2021-05-04 DIAGNOSIS — M8580 Other specified disorders of bone density and structure, unspecified site: Secondary | ICD-10-CM

## 2021-05-04 DIAGNOSIS — Z0000 Encounter for general adult medical examination without abnormal findings: Secondary | ICD-10-CM

## 2021-05-04 DIAGNOSIS — Z7189 Other specified counseling: Secondary | ICD-10-CM

## 2021-05-04 LAB — COMPREHENSIVE METABOLIC PANEL
ALT: 9 U/L (ref 0–35)
AST: 20 U/L (ref 0–37)
Albumin: 4.4 g/dL (ref 3.5–5.2)
Alkaline Phosphatase: 41 U/L (ref 39–117)
BUN: 16 mg/dL (ref 6–23)
CO2: 30 mEq/L (ref 19–32)
Calcium: 9.3 mg/dL (ref 8.4–10.5)
Chloride: 101 mEq/L (ref 96–112)
Creatinine, Ser: 0.89 mg/dL (ref 0.40–1.20)
GFR: 59.47 mL/min — ABNORMAL LOW (ref >60.00)
Glucose, Bld: 148 mg/dL — ABNORMAL HIGH (ref 70–99)
Potassium: 3.7 mEq/L (ref 3.5–5.1)
Sodium: 139 mEq/L (ref 135–145)
Total Bilirubin: 0.7 mg/dL (ref 0.2–1.2)
Total Protein: 6.9 g/dL (ref 6.0–8.3)

## 2021-05-04 LAB — CBC WITH DIFFERENTIAL/PLATELET
Basophils Absolute: 0.1 K/uL (ref 0.0–0.1)
Basophils Relative: 0.8 % (ref 0.0–3.0)
Eosinophils Absolute: 0.1 K/uL (ref 0.0–0.7)
Eosinophils Relative: 1.9 % (ref 0.0–5.0)
HCT: 39.5 % (ref 36.0–46.0)
Hemoglobin: 12.8 g/dL (ref 12.0–15.0)
Lymphocytes Relative: 18.3 % (ref 12.0–46.0)
Lymphs Abs: 1.3 K/uL (ref 0.7–4.0)
MCHC: 32.4 g/dL (ref 30.0–36.0)
MCV: 95.5 fl (ref 78.0–100.0)
Monocytes Absolute: 0.5 K/uL (ref 0.1–1.0)
Monocytes Relative: 6.5 % (ref 3.0–12.0)
Neutro Abs: 5.3 K/uL (ref 1.4–7.7)
Neutrophils Relative %: 72.5 % (ref 43.0–77.0)
Platelets: 314 K/uL (ref 150.0–400.0)
RBC: 4.13 Mil/uL (ref 3.87–5.11)
RDW: 14 % (ref 11.5–15.5)
WBC: 7.3 K/uL (ref 4.0–10.5)

## 2021-05-04 LAB — LIPID PANEL
Cholesterol: 245 mg/dL — ABNORMAL HIGH (ref 0–200)
HDL: 68 mg/dL (ref >39.00)
LDL Cholesterol: 154 mg/dL — ABNORMAL HIGH (ref 0–99)
NonHDL: 177.09
Total CHOL/HDL Ratio: 4
Triglycerides: 117 mg/dL (ref 0.0–149.0)
VLDL: 23.4 mg/dL (ref 0.0–40.0)

## 2021-05-04 LAB — VITAMIN B12: Vitamin B-12: >1550 pg/mL — ABNORMAL HIGH (ref 211–911)

## 2021-05-04 LAB — TSH: TSH: 3.62 u[IU]/mL (ref 0.35–5.50)

## 2021-05-04 LAB — VITAMIN D 25 HYDROXY (VIT D DEFICIENCY, FRACTURES): VITD: 71.36 ng/mL (ref 30.00–100.00)

## 2021-05-04 NOTE — Progress Notes (Signed)
This visit occurred during the SARS-CoV-2 public health emergency.  Safety protocols were in place, including screening questions prior to the visit, additional usage of staff PPE, and extensive cleaning of exam room while observing appropriate contact time as indicated for disinfecting solutions.  I have personally reviewed the Medicare Annual Wellness questionnaire and have noted 1. The patient's medical and social history 2. Their use of alcohol, tobacco or illicit drugs 3. Their current medications and supplements 4. The patient's functional ability including ADL's, fall risks, home safety risks and hearing or visual             impairment. 5. Diet and physical activities 6. Evidence for depression or mood disorders  The patients weight, height, BMI have been recorded in the chart and visual acuity is per eye clinic.  I have made referrals, counseling and provided education to the patient based review of the above and I have provided the pt with a written personalized care plan for preventive services.  Provider list updated- see scanned forms.  Routine anticipatory guidance given to patient.  See health maintenance. The possibility exists that previously documented standard health maintenance information may have been brought forward from a previous encounter into this note.  If needed, that same information has been updated to reflect the current situation based on today's encounter.    Flu 2022 Shingles discussed with PNA previously done Tetanus 2013 COVID-vaccine previously done Colon cancer screening deferred given her age. Breast cancer screening discussed, declined by patient. Bone density testing discussed, declined by patient Advance directive- all 4 kids equally designated if patient were incapacitated.  Cognitive function addressed- see scanned forms- and if abnormal then additional documentation follows.   In addition to [facility] Wellness, follow up visit for the below  conditions:  B12 def, trough level today.  Labs pending.  Compliant o/w.    Taking tramadol prn.  She is putting up with some pain at baseline.  She is sleeping more after use, but not drowsy o/w, ie she may rest better with pain control.    Hypothyroidism.  Still on a day.  D/w pt about routine use.  TSH pending.    Memory d/w pt.  "It's not what it used to be."  Family is checking on patient- staying at night.  She has some help during the day also.  Family noted gradual memory change.  Still living at home.    She'll see cardiology in May.    PMH and SH reviewed  Meds, vitals, and allergies reviewed.   ROS: Per HPI.  Unless specifically indicated otherwise in HPI, the patient denies:  General: fever. Eyes: acute vision changes ENT: sore throat Cardiovascular: chest pain Respiratory: SOB GI: vomiting GU: dysuria Musculoskeletal: acute back pain Derm: acute rash Neuro: acute motor dysfunction Psych: worsening mood Endocrine: polydipsia Heme: bleeding Allergy: hayfever  GEN: nad, alert and oriented HEENT: ncat NECK: supple w/o LA CV: rrr. PULM: ctab, no inc wob ABD: soft, +bs EXT: no edema SKIN: no acute rash  Memory testing today with 3 out of 3 on recall but 0 out of 3 for orientation to time.

## 2021-05-04 NOTE — Assessment & Plan Note (Signed)
Advance directive- all 4 kids equally designated if patient were incapacitated.

## 2021-05-04 NOTE — Patient Instructions (Signed)
Go to the lab on the way out.   If you have mychart we'll likely use that to update you.    Take care.  Glad to see you.  I want to recheck your memory in about 2-3 months at a visit at [facility].  Update me as needed in the meantime.

## 2021-05-07 NOTE — Assessment & Plan Note (Signed)
TSH pending.  Continue levothyroxine.  See notes on labs.

## 2021-05-07 NOTE — Assessment & Plan Note (Signed)
Continue replacement.  Trough level today.  See notes on labs.

## 2021-05-07 NOTE — Assessment & Plan Note (Signed)
Continue tramadol as needed.  Still okay for outpatient follow-up.

## 2021-05-07 NOTE — Assessment & Plan Note (Signed)
Recheck labs today looking for reversible causes.  She has extra help from family.  They are checking on her and she has extra help during the day at home.  I want to recheck a full MMSE in about 2-3 months.  See after visit summary.

## 2021-05-07 NOTE — Assessment & Plan Note (Signed)
Flu 2022 Shingles discussed with PNA previously done Tetanus 2013 COVID-vaccine previously done Colon cancer screening deferred given her age. Breast cancer screening discussed, declined by patient. Bone density testing discussed, declined by patient Advance directive- all 4 kids equally designated if patient were incapacitated.  Cognitive function addressed- see scanned forms- and if abnormal then additional documentation follows.

## 2021-05-08 ENCOUNTER — Other Ambulatory Visit: Payer: Self-pay | Admitting: Family Medicine

## 2021-05-08 DIAGNOSIS — E538 Deficiency of other specified B group vitamins: Secondary | ICD-10-CM

## 2021-05-08 MED ORDER — CYANOCOBALAMIN 1000 MCG/ML IJ SOLN: INTRAMUSCULAR | 0 refills | Status: DC

## 2021-05-24 DIAGNOSIS — H401131 Primary open-angle glaucoma, bilateral, mild stage: Secondary | ICD-10-CM | POA: Diagnosis not present

## 2021-05-24 DIAGNOSIS — E538 Deficiency of other specified B group vitamins: Secondary | ICD-10-CM | POA: Diagnosis not present

## 2021-05-24 DIAGNOSIS — G43909 Migraine, unspecified, not intractable, without status migrainosus: Secondary | ICD-10-CM | POA: Diagnosis not present

## 2021-05-24 DIAGNOSIS — J449 Chronic obstructive pulmonary disease, unspecified: Secondary | ICD-10-CM | POA: Diagnosis not present

## 2021-05-24 DIAGNOSIS — E039 Hypothyroidism, unspecified: Secondary | ICD-10-CM | POA: Diagnosis not present

## 2021-05-24 DIAGNOSIS — I700 Atherosclerosis of aorta: Secondary | ICD-10-CM | POA: Diagnosis not present

## 2021-05-24 DIAGNOSIS — E785 Hyperlipidemia, unspecified: Secondary | ICD-10-CM | POA: Diagnosis not present

## 2021-05-24 DIAGNOSIS — F0280 Dementia in other diseases classified elsewhere without behavioral disturbance: Secondary | ICD-10-CM | POA: Diagnosis not present

## 2021-05-24 DIAGNOSIS — D6869 Other thrombophilia: Secondary | ICD-10-CM | POA: Diagnosis not present

## 2021-05-24 DIAGNOSIS — Z87891 Personal history of nicotine dependence: Secondary | ICD-10-CM | POA: Diagnosis not present

## 2021-05-24 DIAGNOSIS — I4891 Unspecified atrial fibrillation: Secondary | ICD-10-CM | POA: Diagnosis not present

## 2021-05-29 ENCOUNTER — Telehealth: Payer: Self-pay

## 2021-05-29 ENCOUNTER — Other Ambulatory Visit: Payer: Self-pay

## 2021-05-29 ENCOUNTER — Encounter: Payer: Self-pay | Admitting: Emergency Medicine

## 2021-05-29 ENCOUNTER — Emergency Department
Admission: EM | Admit: 2021-05-29 | Discharge: 2021-05-29 | Disposition: A | Discharge status: Home / Self Care | Payer: PPO | Attending: Emergency Medicine | Admitting: Emergency Medicine

## 2021-05-29 ENCOUNTER — Emergency Department: Payer: PPO

## 2021-05-29 DIAGNOSIS — E039 Hypothyroidism, unspecified: Secondary | ICD-10-CM | POA: Insufficient documentation

## 2021-05-29 DIAGNOSIS — R400 Somnolence: Secondary | ICD-10-CM

## 2021-05-29 DIAGNOSIS — Z20822 Contact with and (suspected) exposure to covid-19: Secondary | ICD-10-CM | POA: Diagnosis not present

## 2021-05-29 DIAGNOSIS — R7989 Other specified abnormal findings of blood chemistry: Secondary | ICD-10-CM | POA: Insufficient documentation

## 2021-05-29 DIAGNOSIS — I480 Paroxysmal atrial fibrillation: Secondary | ICD-10-CM | POA: Insufficient documentation

## 2021-05-29 DIAGNOSIS — R4182 Altered mental status, unspecified: Secondary | ICD-10-CM | POA: Diagnosis not present

## 2021-05-29 DIAGNOSIS — R079 Chest pain, unspecified: Secondary | ICD-10-CM | POA: Diagnosis not present

## 2021-05-29 DIAGNOSIS — J449 Chronic obstructive pulmonary disease, unspecified: Secondary | ICD-10-CM | POA: Insufficient documentation

## 2021-05-29 DIAGNOSIS — R5383 Other fatigue: Secondary | ICD-10-CM | POA: Insufficient documentation

## 2021-05-29 DIAGNOSIS — R9431 Abnormal electrocardiogram [ECG] [EKG]: Secondary | ICD-10-CM | POA: Diagnosis not present

## 2021-05-29 DIAGNOSIS — J984 Other disorders of lung: Secondary | ICD-10-CM | POA: Diagnosis not present

## 2021-05-29 LAB — COMPREHENSIVE METABOLIC PANEL
ALT: 14 U/L (ref 0–44)
AST: 26 U/L (ref 15–41)
Albumin: 3.8 g/dL (ref 3.5–5.0)
Alkaline Phosphatase: 36 U/L — ABNORMAL LOW (ref 38–126)
Anion gap: 5 (ref 5–15)
BUN: 26 mg/dL — ABNORMAL HIGH (ref 8–23)
CO2: 26 mmol/L (ref 22–32)
Calcium: 9 mg/dL (ref 8.9–10.3)
Chloride: 109 mmol/L (ref 98–111)
Creatinine, Ser: 0.96 mg/dL (ref 0.44–1.00)
GFR, Estimated: 58 mL/min — ABNORMAL LOW (ref >60)
Glucose, Bld: 140 mg/dL — ABNORMAL HIGH (ref 70–99)
Potassium: 3.5 mmol/L (ref 3.5–5.1)
Sodium: 140 mmol/L (ref 135–145)
Total Bilirubin: 0.5 mg/dL (ref 0.3–1.2)
Total Protein: 7.1 g/dL (ref 6.5–8.1)

## 2021-05-29 LAB — CBC WITH DIFFERENTIAL/PLATELET
Abs Immature Granulocytes: 0.03 10*3/uL (ref 0.00–0.07)
Basophils Absolute: 0 10*3/uL (ref 0.0–0.1)
Basophils Relative: 1 %
Eosinophils Absolute: 0.3 10*3/uL (ref 0.0–0.5)
Eosinophils Relative: 5 %
HCT: 40.4 % (ref 36.0–46.0)
Hemoglobin: 12.5 g/dL (ref 12.0–15.0)
Immature Granulocytes: 0 %
Lymphocytes Relative: 23 %
Lymphs Abs: 1.6 10*3/uL (ref 0.7–4.0)
MCH: 31 pg (ref 26.0–34.0)
MCHC: 30.9 g/dL (ref 30.0–36.0)
MCV: 100.2 fL — ABNORMAL HIGH (ref 80.0–100.0)
Monocytes Absolute: 0.7 10*3/uL (ref 0.1–1.0)
Monocytes Relative: 9 %
Neutro Abs: 4.4 10*3/uL (ref 1.7–7.7)
Neutrophils Relative %: 62 %
Platelets: 288 10*3/uL (ref 150–400)
RBC: 4.03 MIL/uL (ref 3.87–5.11)
RDW: 13.9 % (ref 11.5–15.5)
WBC: 7 10*3/uL (ref 4.0–10.5)
nRBC: 0 % (ref 0.0–0.2)

## 2021-05-29 LAB — URINALYSIS, COMPLETE (UACMP) WITH MICROSCOPIC
Bacteria, UA: NONE SEEN
Bilirubin Urine: NEGATIVE
Glucose, UA: NEGATIVE mg/dL
Ketones, ur: NEGATIVE mg/dL
Nitrite: NEGATIVE
Protein, ur: NEGATIVE mg/dL
Specific Gravity, Urine: 1.01 (ref 1.005–1.030)
pH: 5 (ref 5.0–8.0)

## 2021-05-29 LAB — RESP PANEL BY RT-PCR (FLU A&B, COVID) ARPGX2
Influenza A by PCR: NEGATIVE
Influenza B by PCR: NEGATIVE
SARS Coronavirus 2 by RT PCR: NEGATIVE

## 2021-05-29 LAB — TROPONIN I (HIGH SENSITIVITY)
Troponin I (High Sensitivity): 7 ng/L (ref <18)
Troponin I (High Sensitivity): 7 ng/L (ref <18)

## 2021-05-29 LAB — T4, FREE: Free T4: 0.73 ng/dL (ref 0.61–1.12)

## 2021-05-29 LAB — AMMONIA: Ammonia: 17 umol/L (ref 9–35)

## 2021-05-29 LAB — TSH: TSH: 2.178 u[IU]/mL (ref 0.350–4.500)

## 2021-05-29 LAB — LIPASE, BLOOD: Lipase: 61 U/L — ABNORMAL HIGH (ref 11–51)

## 2021-05-29 NOTE — Telephone Encounter (Signed)
[facility] - Client TELEPHONE ADVICE RECORD [facility] Patient Name: Vanessa [name] Gender: Female DOB: 1934-10-15 Age: 87 Y 84 M 23 D Return Phone Number: [phone] (Primary), [phone] (Secondary) Address: City/ State/ Zip: [place]  [address] Client [place] [facility] - Client Client [facility] - Day Provider Renford Dills - MD Contact Type Call Who Is Calling Patient / Member / Family / Caregiver Call Type Triage / Clinical Caller Name kelly Iannacone Relationship To Patient Daughter Return Phone Number [phone] (Primary) Chief Complaint CONFUSION - new onset Reason for Call Symptomatic / Request for [place] states her mom is having irritated behavior for urinary tract infection, confused, walking down street and tearing pictures out of frames. Location confirmed. Additional Comment exhausted search was done. Translation No Nurse Assessment Nurse: [place], RN, Estill Bamberg Date/Time (Eastern Time): 05/29/2021 1:09:13 PM Confirm and document reason for call. If symptomatic, describe symptoms. ---caller states she heard from her mother's caregiver that her mom has been erratic and had tore stuff apart and does not know who the caregiver is. Does the patient have any new or worsening symptoms? ---Yes Will a triage be completed? ---Yes Related visit to physician within the last 2 weeks? ---N/A Does the PT have any chronic conditions? (i.e. diabetes, asthma, this includes High risk factors for pregnancy, etc.) ---Yes List chronic conditions. ---SVT on flecanide and hypothyroidism Is this a behavioral health or substance abuse call? ---No Guidelines Guideline Title Affirmed Question Affirmed Notes Nurse Date/Time (Eastern Time) Confusion - Delirium [1] Difficult to awaken or acting confused (e.g., disoriented, slurred speech) AND [2] Humfleet,  RN, Estill Bamberg 05/29/2021 1:13:29 PM PLEASE NOTE: All timestamps contained within this report are represented as [place] Standard Time. CONFIDENTIALTY NOTICE: This fax transmission is intended only for the addressee. It contains information that is legally privileged, confidential or otherwise protected from use or disclosure. If you are not the intended recipient, you are strictly prohibited from reviewing, disclosing, copying using or disseminating any of this information or taking any action in reliance on or regarding this information. If you have received this fax in error, please notify us immediately by telephone so that we can arrange for its return to [place]. Phone: [phone], Toll-Free: [phone], Fax: [phone] Page: 2 of 2 Call Id: [id number] Guidelines Guideline Title Affirmed Question Affirmed Notes Nurse Date/Time Eilene Ghazi Time) present now AND [3] new-onset Disp. Time Eilene Ghazi Time) Disposition Final User 05/29/2021 1:08:12 PM Send to Urgent Leland Her 05/29/2021 1:16:54 PM 911 Outcome Documentation Humfleet, RN, Estill Bamberg Reason: calling now 05/29/2021 1:14:41 PM Call EMS 911 Now Yes Humfleet, RN, Shelly Coss Disagree/Comply Comply Caller Understands Yes PreDisposition InappropriateToAsk Care Advice Given Per Guideline CALL EMS 911 NOW: CARE ADVICE given per Confusion-Delirium (Adult) guideline. * Immediate medical attention is needed. You need to hang up and call 911 (or an ambulance). Comments User: Rozelle Logan, RN Date/Time Eilene Ghazi Time): 05/29/2021 1:16:16 PM caregiver left the patient at noon. advised dtr to call 911. patient has another family member that lives across the street. advised her to have that family member go to her house and wait on EMS. She stated she does not need a call bac

## 2021-05-29 NOTE — Discharge Instructions (Signed)
Work-up was overall reassuring.  Return to the ER if her mental status is getting worse, developing fevers or any other concerns

## 2021-05-29 NOTE — ED Provider Notes (Signed)
[facility] Provider Note    Event Date/Time   First MD Initiated Contact with Patient 05/29/21 1517     (approximate)   History   Altered Mental Status   HPI  Vanessa [name] is a 87 y.o. female with paroxysmal A-fib, COPD, CHF, hypothyroid who comes in with some confusion.  Patient is present with the son.  Patient lives by herself but she gets some help in the mornings and then a family member comes in the afternoon.  She ambulates without any assistance at home.  She reports ambulating fine family reports that she just seems a little bit more confused and sleepy over the past 5 to 6 days.  They tried to get her into her primary care doctor but they did not have any appointments and they were told to come to the ER to be evaluated.  She did report a little bit of epigastric chest pain while in the waiting room but they think that that was just the way that she was sitting.  She denies any pain right now in her abdomen.  She denies any falls, or other concerns.  Has been taking all of her medications.  Physical Exam   Triage Vital Signs: ED Triage Vitals [05/29/21 1513]  Enc Vitals Group     BP      Pulse      Resp      Temp      Temp src      SpO2      Weight 130 lb (59 kg)     Height 5\' 2"  (1.575 m)     Head Circumference      Peak Flow      Pain Score 8     Pain Loc      Pain Edu?      Excl. in GC?     Most recent vital signs: Vitals:   05/29/21 1530  BP: (!) 148/81  Pulse: 66  Resp: 16  SpO2: 100%     General: Awake, no distress.  Alert and oriented x2 but does not know the year CV:  Good peripheral perfusion.  Resp:  Normal effort.  Abd:  No distention.  Soft and nontender Other:  Equal strength in arms and legs.  Sensation intact.  Cranial nerves intact   ED Results / Procedures / Treatments   Labs (all labs ordered are listed, but only abnormal results are displayed) Labs Reviewed  URINALYSIS, COMPLETE (UACMP) WITH  MICROSCOPIC - Abnormal; Notable for the following components:      Result Value   APPearance CLEAR (*)    Hgb urine dipstick TRACE (*)    Leukocytes,Ua TRACE (*)    All other components within normal limits  CBC WITH DIFFERENTIAL/PLATELET - Abnormal; Notable for the following components:   MCV 100.2 (*)    All other components within normal limits  COMPREHENSIVE METABOLIC PANEL - Abnormal; Notable for the following components:   Glucose, Bld 140 (*)    BUN 26 (*)    Alkaline Phosphatase 36 (*)    GFR, Estimated 58 (*)    All other components within normal limits  LIPASE, BLOOD - Abnormal; Notable for the following components:   Lipase 61 (*)    All other components within normal limits  RESP PANEL BY RT-PCR (FLU A&B, COVID) ARPGX2  URINE CULTURE  AMMONIA  TSH  T4, FREE  TROPONIN I (HIGH SENSITIVITY)  TROPONIN I (HIGH SENSITIVITY)  EKG  My interpretation of EKG:  Atrial fibrillation rate of 69 without any ST elevation or T wave inversions, normal intervals  RADIOLOGY I have reviewed the xray personally and agree with radiology read no PNA but hiatal hernia    PROCEDURES:  Critical Care performed: No  .1-3 Lead EKG Interpretation Performed by: Concha Se, MD Authorized by: Concha Se, MD     Interpretation: abnormal     ECG rate:  60   ECG rate assessment: normal     Rhythm: atrial fibrillation     Ectopy: none     Conduction: normal     MEDICATIONS ORDERED IN ED: Medications - No data to display   IMPRESSION / MDM / ASSESSMENT AND PLAN / ED COURSE  I reviewed the triage vital signs and the nursing notes.  Patient with A-fib, hypothyroidism who comes in with for some confusion and fatigue  At this time patient has reassuring neuro exam but is alert and oriented x2.  She denies any confusion personally but family have noted that.  We will get labs to evaluate for any electrolyte abnormalities, AKI, thyroid issues, UTI.  Given a little bit of  epigastric discomfort earlier we will get EKG, cardiac markers to evaluate for ACS.  Will get CT head to evaluate for any intercranial hemorrhage or other causes of confusion.   Cardiac markers are negative x2 Ammonia is negative COVID, flu was negative Thyroid is normal UA is normal CBC shows slightly elevated MCV.  I did alert patient family advised to Follow-up for folate, B12 testing CMP shows slightly elevated BUN and I encourage some p.o. hydration Lipase slightly elevated but this is nonspecific.  Repeat evaluation she is no abdominal tenderness.  We discussed CT imaging but given she is soft and nontender with normal white count and normal temperature I have low suspicion for acute pathology here.  Son is at bedside states she was not complaining of any abdominal pain until she was sitting awkwardly in the chair in the waiting room but since not being in that chair she has not had any issues.  We have elected to hold off on any CT imaging.  I considered admission for patient given the concern for altered mental status but now family is reporting that she has been back to her normal self since being here and given her work-up is reassuring they feel comfortable with discharge home.  The patient is on the cardiac monitor to evaluate for evidence of arrhythmia and/or significant heart rate changes.   FINAL CLINICAL IMPRESSION(S) / ED DIAGNOSES   Final diagnoses:  Sleepiness     Rx / DC Orders   ED Discharge Orders     None        Note:  This document was prepared using Dragon voice recognition software and may include unintentional dictation errors.   Concha Se, MD 05/29/21 [phone]

## 2021-05-29 NOTE — ED Notes (Signed)
Patient transported to CT 

## 2021-05-29 NOTE — Telephone Encounter (Signed)
I spoke with [name] (DPR signed) [name] said that pts son is taking pt now to [facility] ED. [name] appreciated FU call. Sending note to Dr Para March and Shanda Bumps CMA.

## 2021-05-29 NOTE — Telephone Encounter (Signed)
Noted, will await the ER report.  Thanks.

## 2021-05-29 NOTE — ED Triage Notes (Signed)
Pt via POV from home. Pt is accompanied by son. Son states she has been more confused normally and sleeping more than normal. Son wants her to be check for a UTI. Pt also c/o lower abd pain

## 2021-05-30 ENCOUNTER — Telehealth: Payer: Self-pay | Admitting: Family Medicine

## 2021-05-30 NOTE — Telephone Encounter (Signed)
Noted. Thanks.

## 2021-05-30 NOTE — Telephone Encounter (Signed)
Please check with family about patient's status today.  Thanks.

## 2021-05-30 NOTE — Telephone Encounter (Signed)
Spoke with patients daughter and states patient was seen in the hospital yesterday. States they checked off everything they could when she was there but could not find anything that was causing her sx. Patient was doing well last night and today. We went ahead and scheduled a hospital f/u appt for 06/05/21.

## 2021-06-02 LAB — URINE CULTURE: Culture: 40000 — AB

## 2021-06-03 NOTE — Progress Notes (Signed)
ED Antimicrobial Stewardship Positive Culture Follow Up   Vanessa [name] is an 87 y.o. female who presented to [facility] on 05/29/2021 with a chief complaint of confusion.   Chief Complaint  Patient presents with   Altered Mental Status    Recent Results (from the past 720 hour(s))  Urine Culture     Status: Abnormal   Collection Time: 05/29/21  3:22 PM   Specimen: Urine, Clean Catch  Result Value Ref Range Status   Specimen Description   Final    URINE, CLEAN CATCH Performed at [facility], [address] [place], [place] [address]    Special Requests   Final    NONE Performed at [facility], [address] [place], [place] [address]    Culture 40,000 COLONIES/mL STREPTOCOCCUS GALLOLYTICUS (A)  Final   Report Status 06/02/2021 FINAL  Final   Organism ID, Bacteria STREPTOCOCCUS GALLOLYTICUS (A)  Final      Susceptibility   Streptococcus gallolyticus - MIC*    PENICILLIN <=0.06 SENSITIVE Sensitive     CEFTRIAXONE <=0.12 SENSITIVE Sensitive     ERYTHROMYCIN <=0.12 SENSITIVE Sensitive     LEVOFLOXACIN 2 SENSITIVE Sensitive     VANCOMYCIN 0.25 SENSITIVE Sensitive     * 40,000 COLONIES/mL STREPTOCOCCUS GALLOLYTICUS  Resp Panel by RT-PCR (Flu A&B, Covid) Urine, Clean Catch     Status: None   Collection Time: 05/29/21  3:23 PM   Specimen: Urine, Clean Catch; Nasopharyngeal(NP) swabs in vial transport medium  Result Value Ref Range Status   SARS Coronavirus 2 by RT PCR NEGATIVE NEGATIVE Final    Comment: (NOTE) SARS-CoV-2 target nucleic acids are NOT DETECTED.  The SARS-CoV-2 RNA is generally detectable in upper respiratory specimens during the acute phase of infection. The lowest concentration of SARS-CoV-2 viral copies this assay can detect is 138 copies/mL. A negative result does not preclude SARS-Cov-2 infection and should not be used as the sole basis for treatment or other patient management decisions. A negative result may occur with  improper  specimen collection/handling, submission of specimen other than nasopharyngeal swab, presence of viral mutation(s) within the areas targeted by this assay, and inadequate number of viral copies(<138 copies/mL). A negative result must be combined with clinical observations, patient history, and epidemiological information. The expected result is Negative.  Fact Sheet for Patients:  BloggerCourse.comhttps://www.fda.gov/media/152166/download  Fact Sheet for Healthcare Providers:  SeriousBroker.ithttps://www.fda.gov/media/152162/download  This test is no t yet approved or cleared by the [place] FDA and  has been authorized for detection and/or diagnosis of SARS-CoV-2 by FDA under an Emergency Use Authorization (EUA). This EUA will remain  in effect (meaning this test can be used) for the duration of the COVID-19 declaration under Section 564(b)(1) of the Act, 21 U.S.C.section 360bbb-3(b)(1), unless the authorization is terminated  or revoked sooner.       Influenza A by PCR NEGATIVE NEGATIVE Final   Influenza B by PCR NEGATIVE NEGATIVE Final    Comment: (NOTE) The Xpert Xpress SARS-CoV-2/FLU/RSV plus assay is intended as an aid in the diagnosis of influenza from Nasopharyngeal swab specimens and should not be used as a sole basis for treatment. Nasal washings and aspirates are unacceptable for Xpert Xpress SARS-CoV-2/FLU/RSV testing.  Fact Sheet for Patients: BloggerCourse.comhttps://www.fda.gov/media/152166/download  Fact Sheet for Healthcare Providers: SeriousBroker.ithttps://www.fda.gov/media/152162/download  This test is not yet approved or cleared by the [place] FDA and has been authorized for detection and/or diagnosis of SARS-CoV-2 by FDA under an Emergency Use Authorization (EUA). This EUA will  remain in effect (meaning this test can be used) for the duration of the COVID-19 declaration under Section 564(b)(1) of the Act, 21 U.S.C. section 360bbb-3(b)(1), unless the authorization is terminated or revoked.  Performed at  [facility], [address] [place], [place] [address]    Symptoms of confusion, however no mention of urinary symptoms. UA not supportive of UTI (no pyuria). Urine culture S. Gallolyticus with only 40,000 colonies.   [x]  Patient discharged originally without antimicrobial agent   Treatment is not indicated.   ED Provider: Dr. Elouise Munroe, PharmD Pharmacy Resident  06/03/2021 3:15 PM

## 2021-06-05 ENCOUNTER — Ambulatory Visit (INDEPENDENT_AMBULATORY_CARE_PROVIDER_SITE_OTHER): Payer: PPO | Admitting: Family Medicine

## 2021-06-05 ENCOUNTER — Other Ambulatory Visit: Payer: Self-pay

## 2021-06-05 ENCOUNTER — Encounter: Payer: Self-pay | Admitting: Family Medicine

## 2021-06-05 DIAGNOSIS — R413 Other amnesia: Secondary | ICD-10-CM

## 2021-06-05 MED ORDER — AMOXICILLIN 875 MG PO TABS: 875.0000 mg | ORAL_TABLET | Freq: Two times a day (BID) | ORAL | 0 refills | Status: AC

## 2021-06-05 NOTE — Patient Instructions (Addendum)
It can be difficult to evaluate memory changes when there is a possible concurrent infection, especially a UTI.  I would take amoxil twice a day for 1 week.  Please update me if you notice changes in the next few days (fevers, chills, abdominal pain, memory or behavior changes).   I think we should recheck your memory as planned but we may need to move that appointment sooner.   Take care.  Glad to see you.

## 2021-06-05 NOTE — Progress Notes (Signed)
This visit occurred during the SARS-CoV-2 public health emergency.  Safety protocols were in place, including screening questions prior to the visit, additional usage of staff PPE, and extensive cleaning of exam room while observing appropriate contact time as indicated for disinfecting solutions.  She was acting abnormally and didn't recall details/people, was searching her house.  Her this was atypical and she went to ER.  She didn't recall the ER episode.    Urine cx.  40,000 COLONIES/mL STREPTOCOCCUS GALLOLYTICUS Abnormal    In the meantime, no dysuria.  No fevers, no chills.  No CP, not SOB.    She is having a better day than the day of ER eval.  Family noted short term memory changes prior to ER eval.    Ct recently done:  Brain: No evidence of acute infarction, hemorrhage, hydrocephalus, extra-axial collection or mass lesion/mass effect. Stable diffuse atrophy.  She has had recent B12 testing that was not low.  Meds, vitals, and allergies reviewed.   ROS: Per HPI unless specifically indicated in ROS section   GEN: nad, alert and pleasant in conversation but she does not recall recent events.   She needs to be redirected during the conversation. HEENT:ncat NECK: supple w/o LA CV: rrr.   PULM: ctab, no inc wob ABD: soft, +bs EXT: no edema SKIN: Well-perfused.  30 minutes were devoted to patient care in this encounter (this includes time spent reviewing the patient's file/history, interviewing and examining the patient, counseling/reviewing plan with patient).

## 2021-06-11 NOTE — Assessment & Plan Note (Signed)
Discussed recent events and the fact that it can be difficult to evaluate memory changes when there is a possible concurrent infection, especially a UTI.  I would take amoxil twice a day for 1 week.  I asked patient and daughter to please update me if they notice changes in the next few days (fevers, chills, abdominal pain, memory or behavior changes).   I think we should recheck her memory as planned but we may need to move that appointment sooner, depending on her clinical course.  It could be that she has underlying memory changes that were exacerbated by recent UTI.  At this point still okay for outpatient follow-up with family help at home.

## 2021-06-29 ENCOUNTER — Ambulatory Visit: Payer: PPO | Admitting: Internal Medicine

## 2021-06-29 ENCOUNTER — Other Ambulatory Visit: Payer: Self-pay

## 2021-06-29 ENCOUNTER — Encounter: Payer: Self-pay | Admitting: Internal Medicine

## 2021-06-29 VITALS — BP 140/72 | HR 71 | Ht 62.0 in | Wt 133.0 lb

## 2021-06-29 DIAGNOSIS — I471 Supraventricular tachycardia: Secondary | ICD-10-CM

## 2021-06-29 DIAGNOSIS — R001 Bradycardia, unspecified: Secondary | ICD-10-CM | POA: Diagnosis not present

## 2021-06-29 NOTE — Progress Notes (Signed)
? ? ? ? ?Patient Care Team: ?Tonia Ghent, MD as PCP - General (Family Medicine) ?Leandrew Koyanagi, MD as Referring Physician (Ophthalmology) ?Minna Merritts, MD as Consulting Physician (Cardiology) ? ? ?HPI ? ?Vanessa [name] is a 87 y.o. female Seen in follow-up for tachycardia.  I saw her 11/12/2017 and failed to write a note.  She had been seen earlier that day by RD-PA.  She had been noted to have "atrial fibrillation "and some degree of bradycardia.  Heart rates in AF were in the 120s and in sinus rhythm were in the 50s.  Zio monitor had demonstrated recurrent episodes of tachycardia up to 170. ? ?We ended up discontinuing her Cardizem and beginning her on flecainide>> review of the strips suggested that it was more of an atrial tachycardia than atrial fibrillation and we discontinued her Eliquis ? ?Saw Dr. Curt Bears she declined ablation.  Has been maintained on flecainide and denies palps ? ?Was having issues of dizziness for orthostatic.  Tried abdominal binder without benefit; however, she ended up stopping as needed tramadol and the symptoms have abated ?Seen in the emergency room for acute confusion.  No precipitating factor was identified; confusion persists i.e. she has not returned to baseline but is better ? ?Denies chest pain or shortness of breath.  No nocturnal dyspnea orthopnea or peripheral edema ?DATE PR interval QRSduration Dose  ?7/19  148 78 0  ?8/19 168 82 50 F  ?12/19 200 90 75  ?3/22 240 104 75  ?3/23 224 92 50  ? ?  ?Date Cr K Hgb TSH  ?2/23 0.96 3.5 12.2 2.178  ?       ? ? ? ?Records and Results Reviewed ? ? ?Past Medical History:  ?Diagnosis Date  ? Anemia   ? treated ~2009 with iron, resolved  ? Arthritis   ? Cataract   ? surgery on R catarct 2012  ? Chronic systolic CHF (congestive heart failure) ([place])   ? a. TTE 2012: EF of 45-50%, mild diffuse HK, trivial AI, mild MR, mildly dilated RV with mild reduction of RVSF, mild to moderate TR, mild to moderate PR, mildly elevated  PASP  ? COPD (chronic obstructive pulmonary disease) ([name])   ? Family history of adverse reaction to anesthesia   ? Mother - altered mental status (long term)  ? Glaucoma   ? History of pelvic fracture   ? Hyperglycemia   ? mild inc in fasting sugar  ? Hyperlipidemia   ? Hypothyroidism   ? Insomnia   ? Migraines   ? without aura, sx since age 62  ? Osteopenia   ? prev with 10 years of fosamax and intolerant of evista  ? PAF (paroxysmal atrial fibrillation) ([place])   ? a. initially noted 2012; b. CHADS2VASc at least 5 (CHF, age x 2, vasacular disease, female)  ? Pulmonary hypertension ([place])   ? Wears dentures   ? full upper and lower  ? ? ?Past Surgical History:  ?Procedure Laterality Date  ? CATARACT EXTRACTION    ? CATARACT EXTRACTION W/PHACO Left 08/27/2016  ? Procedure: CATARACT EXTRACTION PHACO AND INTRAOCULAR LENS PLACEMENT ([place])  Left;  Surgeon: Leandrew Koyanagi, MD;  Location: [facility];  Service: Ophthalmology;  Laterality: Left;  ? COLONOSCOPY  2012  ? REFRACTIVE SURGERY    ? ? ?Current Meds  ?Medication Sig  ? acetaminophen (TYLENOL) 500 MG tablet Take 500-1,000 mg by mouth every 8 (eight) hours as needed.  ? Apoaequorin (PREVAGEN PO) Take  by mouth daily at 2 am.  ? Cholecalciferol (VITAMIN D) 50 MCG (2000 UT) CAPS Take 1 capsule (2,000 Units total) by mouth daily.  ? Cranberry 1000 MG CAPS Take by mouth daily.  ? cyanocobalamin (,VITAMIN B-12,) 1000 MCG/ML injection Inject 1 mL into the muscle every 2 months.  ? fish oil-omega-3 fatty acids 1000 MG capsule Take 1 g by mouth daily.  ? flecainide (TAMBOCOR) 50 MG tablet Take 1 tablet (50 mg total) by mouth 2 (two) times daily.  ? Insulin Syringe-Needle U-100 (B-D INSULIN SYRINGE 1CC/25GX1") 25G X 1" 1 ML MISC Use with B12 injection  ? latanoprost (XALATAN) 0.005 % ophthalmic solution Place 1 drop into both eyes at bedtime.  ? levothyroxine (SYNTHROID) 25 MCG tablet TAKE ONE TABLET BY MOUTH EVERY MORNING BEFORE BREAKFAST  ? traMADol (ULTRAM) 50 MG  tablet TAKE 1 AND 1/2 TABLETS BY MOUTH THREE TIMES DAILY  ? ? ?Allergies  ?Allergen Reactions  ? Codeine   ?  Doesn't remember reaction  ? Cortisone   ?  Doesn't remember reaction  ? Decongestant [Pseudoephedrine Hcl] Other (See Comments)  ?  Avoid decongestants due to glaucoma hx.    ? Naproxen   ?  Stomach issues  ? Simvastatin   ?  aches  ? ? ? ? ?Review of Systems negative except from HPI and PMH ? ?Physical Exam ?BP 140/72 (BP Location: Left Arm, Patient Position: Sitting, Cuff Size: Normal)   Pulse 71   Ht 5\' 2"  (1.575 m)   Wt 133 lb (60.3 kg)   SpO2 96%   BMI 24.33 kg/m?  ?Well developed and nourished in no acute distress ?HENT normal ?Neck supple with JVP-  flat    ?Clear ?Regular rate and rhythm, no murmurs or gallops ?Abd-soft with active BS ?No Clubbing cyanosis edema ?Skin-warm and dry ?A & Oriented  Grossly normal sensory and motor function ? ?ECG sinus at 71 ?Intervals 22/09/24 ?Axis left -56  ? ?Assessment and  Plan ? ?Atrial tach ? ?Sinus brady ? ?Dizziness-chronic resolved ? ?Balance concerns ? ?QRS widening on flecainide ? ?Coronary Calcification ? ?End-of-life discussion ? ?Palpitations have been quiescient.  QRS is much better on 50 mg twice daily of flecainide.  We will continue at 50 mg twice daily. ? ?She is euvolemic.  Daughter was concerned about heart failure; ? ?Dizziness is quiescient. ?  ?She has coronary artery calcification.  Given her age, I am not sure there is a role for primary prevention statins.  We will hold off for now. ? ?Discussed end-of-life decision-making.  Her daughter who is her healthcare POA was there.  Clarified the desire to be resuscitated but not prolonged on supportive equipment. ?

## 2021-06-29 NOTE — Patient Instructions (Signed)
Medication Instructions:  ?- Your physician recommends that you continue on your current medications as directed. Please refer to the Current Medication list given to you today. ? ?*If you need a refill on your cardiac medications before your next appointment, please call your pharmacy* ? ? ?Lab Work: ?- none ordered ? ?If you have labs (blood work) drawn today and your tests are completely normal, you will receive your results only by: ?MyChart Message (if you have MyChart) OR ?A paper copy in the mail ?If you have any lab test that is abnormal or we need to change your treatment, we will call you to review the results. ? ? ?Testing/Procedures: ?- none ordered ? ? ?Follow-Up: ?At [facility], you and your health needs are our priority.  As part of our continuing mission to provide you with exceptional heart care, we have created designated Provider Care Teams.  These Care Teams include your primary Cardiologist (physician) and Advanced Practice Providers (APPs -  Physician Assistants and Nurse Practitioners) who all work together to provide you with the care you need, when you need it. ? ?We recommend signing up for the patient portal called "MyChart".  Sign up information is provided on this After Visit Summary.  MyChart is used to connect with patients for Virtual Visits (Telemedicine).  Patients are able to view lab/test results, encounter notes, upcoming appointments, etc.  Non-urgent messages can be sent to your provider as well.   ?To learn more about what you can do with MyChart, go to [email or web address].   ? ?Your next appointment:   ?6 month(s) ? ?The format for your next appointment:   ?In Person ? ?Provider:   ?Steven Klein, MD  ? ? ?Other Instructions ?N/a ? ?

## 2021-07-04 ENCOUNTER — Encounter: Payer: Self-pay | Admitting: Family Medicine

## 2021-07-04 ENCOUNTER — Other Ambulatory Visit: Payer: Self-pay

## 2021-07-04 ENCOUNTER — Ambulatory Visit (INDEPENDENT_AMBULATORY_CARE_PROVIDER_SITE_OTHER): Payer: PPO | Admitting: Family Medicine

## 2021-07-04 DIAGNOSIS — R413 Other amnesia: Secondary | ICD-10-CM | POA: Diagnosis not present

## 2021-07-04 MED ORDER — DONEPEZIL HCL 10 MG PO TABS: ORAL_TABLET | ORAL | 1 refills | Status: DC

## 2021-07-04 NOTE — Progress Notes (Signed)
This visit occurred during the SARS-CoV-2 public health emergency.  Safety protocols were in place, including screening questions prior to the visit, additional usage of staff PPE, and extensive cleaning of exam room while observing appropriate contact time as indicated for disinfecting solutions. ? ?Prev ucx pos, treated in the meantime.  Pt didn't recall the prev details and family didn't notice any change with abx treatment.   ? ?Here today with her daughter.  Daughter provides extra history.  Her daughter has noted increased confusion over the past interval.  She is having trouble remembering loved ones, and forgetting that some head passed away.  She forgets recent conversations.  She still seems happy in general per daughter's report.  She had cardiology evaluation done last week.  Patient is pleasant conversation but she does not recall recent details and she repeats herself ? ?Previous imaging discussed.  Brain: No evidence of acute infarction, hemorrhage, hydrocephalus, extra-axial collection or mass lesion/mass effect. Stable diffuse atrophy. ? ? Meds, vitals, and allergies reviewed.  ? ?ROS: Per HPI unless specifically indicated in ROS section  ? ?GEN: nad, alert pleasant conversation  ?HEENT: ncat ?NECK: supple w/o LA ?CV: rrr.   ?PULM: ctab, no inc wob ?ABD: soft, +bs ?EXT: no edema ?SKIN: Well-perfused ? ?MMSE 20/30.  -5 for orientation to time.  -1 for attention, -3 for recall -1 for copying pentagons. ? ?45 minutes were devoted to patient care in this encounter (this includes time spent reviewing the patient's file/history, interviewing and examining the patient, counseling/reviewing plan with patient).  ? ?

## 2021-07-04 NOTE — Patient Instructions (Signed)
I would start donepezil.  1/2 tab at night for 1 month, then 1 tab at night thereafter.  ?Update me if concerns, GI upset, or nightmares.  ?Plan on recheck in about 3 months.   ?Update me as needed in the meantime.  ?Take care.  Glad to see you. ?

## 2021-07-06 ENCOUNTER — Ambulatory Visit: Payer: PPO | Admitting: Family Medicine

## 2021-07-06 NOTE — Assessment & Plan Note (Addendum)
Memory loss noted. ? ?MMSE 20/30.  -5 for orientation to time.  -1 for attention, -3 for recall -1 for copying pentagons. ? ?Previous lab work-up and imaging discussed with patient and daughter.  Presumed dementia.  Family is helping at home.  They have already intervened to unplug her stove and make her house as safe as possible.  Safety considerations discussed with patient and daughter. ? ?Varying forms of memory loss/dementia discussed with patient and daughter.  She does not have typical parkinsonian features. ? ?I would start donepezil.  1/2 tab at night for 1 month, then 1 tab at night thereafter.  ?I asked them to update me if concerns, GI upset, or nightmares.  ?Plan on recheck in about 3 months.   ?Update me as needed in the meantime.  ?Plan agreed. ?

## 2021-07-10 ENCOUNTER — Encounter: Payer: Self-pay | Admitting: Family Medicine

## 2021-07-11 DIAGNOSIS — H401131 Primary open-angle glaucoma, bilateral, mild stage: Secondary | ICD-10-CM | POA: Diagnosis not present

## 2021-07-25 ENCOUNTER — Other Ambulatory Visit: Payer: Self-pay | Admitting: Family Medicine

## 2021-07-25 DIAGNOSIS — E538 Deficiency of other specified B group vitamins: Secondary | ICD-10-CM

## 2021-08-21 ENCOUNTER — Encounter: Payer: Self-pay | Admitting: Internal Medicine

## 2021-08-21 ENCOUNTER — Other Ambulatory Visit: Payer: Self-pay | Admitting: *Deleted

## 2021-08-21 MED ORDER — FLECAINIDE ACETATE 50 MG PO TABS: 50.0000 mg | ORAL_TABLET | Freq: Two times a day (BID) | ORAL | 3 refills | Status: DC

## 2021-09-20 ENCOUNTER — Encounter: Payer: Self-pay | Admitting: Family Medicine

## 2021-09-21 ENCOUNTER — Ambulatory Visit (INDEPENDENT_AMBULATORY_CARE_PROVIDER_SITE_OTHER): Payer: PPO | Admitting: Family Medicine

## 2021-09-21 ENCOUNTER — Telehealth: Payer: Self-pay | Admitting: Family Medicine

## 2021-09-21 ENCOUNTER — Encounter: Payer: Self-pay | Admitting: Family Medicine

## 2021-09-21 VITALS — BP 110/72 | HR 82 | Temp 97.2°F | Ht 62.0 in | Wt 126.0 lb

## 2021-09-21 DIAGNOSIS — R300 Dysuria: Secondary | ICD-10-CM

## 2021-09-21 DIAGNOSIS — R5383 Other fatigue: Secondary | ICD-10-CM

## 2021-09-21 DIAGNOSIS — R42 Dizziness and giddiness: Secondary | ICD-10-CM | POA: Insufficient documentation

## 2021-09-21 LAB — POC URINALSYSI DIPSTICK (AUTOMATED)
Bilirubin, UA: NEGATIVE
Blood, UA: NEGATIVE
Glucose, UA: NEGATIVE
Ketones, UA: NEGATIVE
Nitrite, UA: NEGATIVE
Protein, UA: POSITIVE — AB
Spec Grav, UA: 1.01 (ref 1.010–1.025)
Urobilinogen, UA: >=8 E.U./dL — AB
pH, UA: 6 (ref 5.0–8.0)

## 2021-09-21 MED ORDER — DONEPEZIL HCL 10 MG PO TABS: ORAL_TABLET | ORAL | Status: DC

## 2021-09-21 MED ORDER — SULFAMETHOXAZOLE-TRIMETHOPRIM 800-160 MG PO TABS: 1.0000 | ORAL_TABLET | Freq: Two times a day (BID) | ORAL | 0 refills | Status: DC

## 2021-09-21 MED ORDER — TRAMADOL HCL 50 MG PO TABS: 50.0000 mg | ORAL_TABLET | Freq: Three times a day (TID) | ORAL | Status: DC | PRN

## 2021-09-21 NOTE — Telephone Encounter (Signed)
See mychart message, triage patient, and see if patient needs to be seen sooner.  Thanks.

## 2021-09-21 NOTE — Patient Instructions (Addendum)
Go to the lab on the way out.   If you have mychart we'll likely use that to update you.    Take care.  Glad to see you. Start septra and let me know if you are not feeling better soon.

## 2021-09-21 NOTE — Telephone Encounter (Signed)
Noted. Thanks.

## 2021-09-21 NOTE — Progress Notes (Signed)
She is taking tramadol prn, not daily.  Med list updated.   She is taking donepezil 10mg  a day.  No ADE on med.    Sx started in the last week or so, sleeping more.  Dec in appetite.  She is able to sleep at night, getting up to go to BR but then getting back in bed.  Frequently napping in the day.  No vomiting.  No abd pain.  Urinary incontinence.  No burning with urination recently.  No change in short term memory loss.  Long term intact.    Meds, vitals, and allergies reviewed.   ROS: Per HPI unless specifically indicated in ROS section   GEN: nad, alert and pleasant in conversation. HEENT: mucous membranes moist NECK: supple w/o LA CV: rrr.   PULM: ctab, no inc wob ABD: soft, +bs EXT: no edema SKIN: Well-perfused.

## 2021-09-21 NOTE — Telephone Encounter (Signed)
I spoke with Kelly(DPR signed) and she said pt has had problem with incontinence of urine, confusion on and off and for couple of days pt is sleeping more. No abd pain, no fever, no blood in urine. Pt scheduled appt with Dr Para March 09/21/21 at 3 PM. UC & ED precautions given and [name] voiced understanding. (Offered earlier appt with different provider but only wants to see Dr Para March.)

## 2021-09-22 LAB — COMPREHENSIVE METABOLIC PANEL
ALT: 8 U/L (ref 0–35)
AST: 19 U/L (ref 0–37)
Albumin: 4.2 g/dL (ref 3.5–5.2)
Alkaline Phosphatase: 39 U/L (ref 39–117)
BUN: 14 mg/dL (ref 6–23)
CO2: 32 mEq/L (ref 19–32)
Calcium: 9.4 mg/dL (ref 8.4–10.5)
Chloride: 100 mEq/L (ref 96–112)
Creatinine, Ser: 1.15 mg/dL (ref 0.40–1.20)
GFR: 43.6 mL/min — ABNORMAL LOW (ref >60.00)
Glucose, Bld: 112 mg/dL — ABNORMAL HIGH (ref 70–99)
Potassium: 3 mEq/L — ABNORMAL LOW (ref 3.5–5.1)
Sodium: 141 mEq/L (ref 135–145)
Total Bilirubin: 0.5 mg/dL (ref 0.2–1.2)
Total Protein: 6.9 g/dL (ref 6.0–8.3)

## 2021-09-22 LAB — CBC WITH DIFFERENTIAL/PLATELET
Basophils Absolute: 0.1 10*3/uL (ref 0.0–0.1)
Basophils Relative: 0.7 % (ref 0.0–3.0)
Eosinophils Absolute: 0.1 10*3/uL (ref 0.0–0.7)
Eosinophils Relative: 1 % (ref 0.0–5.0)
HCT: 42 % (ref 36.0–46.0)
Hemoglobin: 13.6 g/dL (ref 12.0–15.0)
Lymphocytes Relative: 12.1 % (ref 12.0–46.0)
Lymphs Abs: 1 10*3/uL (ref 0.7–4.0)
MCHC: 32.3 g/dL (ref 30.0–36.0)
MCV: 89.9 fl (ref 78.0–100.0)
Monocytes Absolute: 0.5 10*3/uL (ref 0.1–1.0)
Monocytes Relative: 6.4 % (ref 3.0–12.0)
Neutro Abs: 6.6 10*3/uL (ref 1.4–7.7)
Neutrophils Relative %: 79.8 % — ABNORMAL HIGH (ref 43.0–77.0)
Platelets: 273 10*3/uL (ref 150.0–400.0)
RBC: 4.68 Mil/uL (ref 3.87–5.11)
RDW: 14.8 % (ref 11.5–15.5)
WBC: 8.3 10*3/uL (ref 4.0–10.5)

## 2021-09-22 LAB — URINE CULTURE
MICRO NUMBER:: 13470578
SPECIMEN QUALITY:: ADEQUATE

## 2021-09-24 DIAGNOSIS — R300 Dysuria: Secondary | ICD-10-CM | POA: Insufficient documentation

## 2021-09-24 NOTE — Assessment & Plan Note (Signed)
Presumed cystitis causing new urinary incontinence.  Okay for outpatient follow-up.  See notes on labs Start septra and family can let me know if she is not feeling better soon.

## 2021-09-27 ENCOUNTER — Encounter: Payer: Self-pay | Admitting: Family Medicine

## 2021-10-03 ENCOUNTER — Ambulatory Visit (INDEPENDENT_AMBULATORY_CARE_PROVIDER_SITE_OTHER): Payer: PPO | Admitting: Family Medicine

## 2021-10-03 ENCOUNTER — Encounter: Payer: Self-pay | Admitting: Family Medicine

## 2021-10-03 DIAGNOSIS — R413 Other amnesia: Secondary | ICD-10-CM | POA: Diagnosis not present

## 2021-10-03 MED ORDER — DONEPEZIL HCL 10 MG PO TABS: ORAL_TABLET | ORAL | Status: DC

## 2021-10-03 MED ORDER — DONEPEZIL HCL 10 MG PO TABS: 5.0000 mg | ORAL_TABLET | Freq: Every day | ORAL | 3 refills | Status: DC

## 2021-10-03 NOTE — Patient Instructions (Addendum)
Try cutting donepezil back to 5mg  at night for about 14 days and let me know how that goes, especially about your fatigue.   Take care.  Glad to see you.

## 2021-10-03 NOTE — Progress Notes (Unsigned)
Done with septra in the meantime.  Still with some trouble with urinary urgency, but daughter thought it was some better.  Agreed to defer other meds for now.    Still with fatigue noted.  Sleeping late and napping.  Daughter came to check on patient today and she was still in bed at the time.  "My memory is nothing to brag about, I still remember my children."  Family has noted some longer term memory changes in addition to short term changes.    Family isn't going to renew driver's license.    Meds, vitals, and allergies reviewed.   ROS: Per HPI unless specifically indicated in ROS section

## 2021-10-04 NOTE — Assessment & Plan Note (Signed)
Decrease donepezil to 5 mg temporarily and she will see if she notices any improvement with fatigue or any other changes.  At this point still okay for outpatient follow-up.  Family is supportive and helping her manage her medications/monitoring her.  See after visit summary.

## 2021-12-28 ENCOUNTER — Encounter: Payer: Self-pay | Admitting: Internal Medicine

## 2021-12-28 ENCOUNTER — Ambulatory Visit: Payer: PPO | Attending: Internal Medicine | Admitting: Internal Medicine

## 2021-12-28 VITALS — BP 100/60 | HR 72 | Ht 64.0 in | Wt 119.5 lb

## 2021-12-28 DIAGNOSIS — I471 Supraventricular tachycardia: Secondary | ICD-10-CM

## 2021-12-28 DIAGNOSIS — R42 Dizziness and giddiness: Secondary | ICD-10-CM

## 2021-12-28 DIAGNOSIS — R001 Bradycardia, unspecified: Secondary | ICD-10-CM | POA: Diagnosis not present

## 2021-12-28 NOTE — Progress Notes (Signed)
Patient Care Team: Joaquim Nam, MD as PCP - General (Family Medicine) Lockie Mola, MD as Referring Physician (Ophthalmology) Antonieta Iba, MD as Consulting Physician (Cardiology)   HPI  Vanessa [name] is a 87 y.o. female Seen in follow-up for tachycardia.  I saw her 11/12/2017 and failed to write a note.  She had been seen earlier that day by RD-PA.  She had been noted to have "atrial fibrillation "and some degree of bradycardia.  Heart rates in AF were in the 120s and in sinus rhythm were in the 50s.  Zio monitor had demonstrated recurrent episodes of tachycardia up to 170.  We ended up discontinuing her Cardizem and beginning her on flecainide>> review of the strips suggested that it was more of an atrial tachycardia than atrial fibrillation and we discontinued her Eliquis  Saw Dr. Elberta Fortis she declined ablation.  Has been maintained on flecainide and denies palps   Ongoing issues with orthostatic dizziness.  Diet is salt deplete. No palpitations.  Minimal shortness of breath.  No edema  DATE PR interval QRSduration Dose  7/19  148 78 0  8/19 168 82 50 F  12/19 200 90 75  3/22 240 104 75  3/3 224 92 50  9/23 200 (low amplitude but I think it is right) 86 50     Date Cr K Hgb TSH  2/23 0.96 3.5 12.2 2.178            Records and Results Reviewed   Past Medical History:  Diagnosis Date   Anemia    treated ~2009 with iron, resolved   Arthritis    Cataract    surgery on R catarct 2012   Chronic systolic CHF (congestive heart failure) ([facility])    a. TTE 2012: EF of 45-50%, mild diffuse HK, trivial AI, mild MR, mildly dilated RV with mild reduction of RVSF, mild to moderate TR, mild to moderate PR, mildly elevated PASP   COPD (chronic obstructive pulmonary disease) ([facility])    Family history of adverse reaction to anesthesia    Mother - altered mental status (long term)   Glaucoma    History of pelvic fracture    Hyperglycemia    mild inc in fasting  sugar   Hyperlipidemia    Hypothyroidism    Insomnia    Migraines    without aura, sx since age 59   Osteopenia    prev with 10 years of fosamax and intolerant of evista   PAF (paroxysmal atrial fibrillation) ([facility])    a. initially noted 2012; b. CHADS2VASc at least 5 (CHF, age x 2, vasacular disease, female)   Pulmonary hypertension ([facility])    Wears dentures    full upper and lower    Past Surgical History:  Procedure Laterality Date   CATARACT EXTRACTION     CATARACT EXTRACTION W/PHACO Left 08/27/2016   Procedure: CATARACT EXTRACTION PHACO AND INTRAOCULAR LENS PLACEMENT ([facility])  Left;  Surgeon: Lockie Mola, MD;  Location: [facility];  Service: Ophthalmology;  Laterality: Left;   COLONOSCOPY  2012   REFRACTIVE SURGERY      Current Meds  Medication Sig   Cholecalciferol (VITAMIN D) 50 MCG (2000 UT) CAPS Take 1 capsule (2,000 Units total) by mouth daily.   Cranberry 1000 MG CAPS Take by mouth daily.   cyanocobalamin (,VITAMIN B-12,) 1000 MCG/ML injection Inject 1 mL into the muscle every 2 months.   donepezil (ARICEPT) 10 MG tablet Take 0.5-1 tablets (5-10  mg total) by mouth at bedtime.   flecainide (TAMBOCOR) 50 MG tablet Take 1 tablet (50 mg total) by mouth 2 (two) times daily.   Insulin Syringe-Needle U-100 (B-D INSULIN SYRINGE 1CC/25GX1") 25G X 1" 1 ML MISC Use with B12 injection   latanoprost (XALATAN) 0.005 % ophthalmic solution Place 1 drop into both eyes at bedtime.   levothyroxine (SYNTHROID) 25 MCG tablet TAKE ONE TABLET BY MOUTH EVERY MORNING BEFORE BREAKFAST   traMADol (ULTRAM) 50 MG tablet Take 1 tablet (50 mg total) by mouth 3 (three) times daily as needed.    Allergies  Allergen Reactions   Codeine     Doesn't remember reaction   Cortisone     Doesn't remember reaction   Decongestant [Pseudoephedrine Hcl] Other (See Comments)    Avoid decongestants due to glaucoma hx.     Naproxen     Stomach issues   Simvastatin     aches      Review of  Systems negative except from HPI and PMH  Physical Exam BP 100/60 (BP Location: Left Arm, Patient Position: Sitting, Cuff Size: Normal)   Pulse 72   Ht 5\' 4"  (1.626 m)   Wt 119 lb 8 oz (54.2 kg)   SpO2 94%   BMI 20.51 kg/m  Well developed and well nourished in no acute distress HENT normal Neck supple  Bibasilar crackles Device pocket well healed; without hematoma or erythema.  There is no tethering  Regular rate and rhythm, no  gallop No  murmur Abd-soft   No Clubbing cyanosis  edema Skin-warm and dry A & Oriented  Grossly normal sensory and motor function  ECG sinus at 72 Intervals 18/09/46  Assessment and  Plan  Atrial tach  Sinus brady  Dizziness-chronic resolved  Low blood pressure  Balance concerns  QRS widening on flecainide dose limited to 50 mg twice daily  Coronary Calcification  Pulmonary scarring by chest x-ray   Palpitations quiescient;, low amplitude P waves but conduction parameters are normal so we will continue 50 mg twice daily  Blood pressure is low, orthostatic lightheadedness. We discussed the physiology of orthostatic intolerance including gravitational fluid shifts and the impact of hypertensive vascular disease on orthostasis and treatment options.  We discussed pharmacological options in the abstract  . We emphasized the importance of recognizing the prodrome and sitting prior to falling, safety in the shower and in the bathroom and the avoidance of dehydration   We will begin with a non pharmacological approach with increasing fluid intake and becoming aggressive about salt.

## 2021-12-28 NOTE — Patient Instructions (Signed)
Medication Instructions:  - Your physician recommends that you continue on your current medications as directed. Please refer to the Current Medication list given to you today.  *If you need a refill on your cardiac medications before your next appointment, please call your pharmacy*   Lab Work: - none ordered  If you have labs (blood work) drawn today and your tests are completely normal, you will receive your results only by: MyChart Message (if you have MyChart) OR A paper copy in the mail If you have any lab test that is abnormal or we need to change your treatment, we will call you to review the results.   Testing/Procedures: - none ordered   Follow-Up: At  [facility], you and your health needs are our priority.  As part of our continuing mission to provide you with exceptional heart care, we have created designated Provider Care Teams.  These Care Teams include your primary Cardiologist (physician) and Advanced Practice Providers (APPs -  Physician Assistants and Nurse Practitioners) who all work together to provide you with the care you need, when you need it.  We recommend signing up for the patient portal called "MyChart".  Sign up information is provided on this After Visit Summary.  MyChart is used to connect with patients for Virtual Visits (Telemedicine).  Patients are able to view lab/test results, encounter notes, upcoming appointments, etc.  Non-urgent messages can be sent to your provider as well.   To learn more about what you can do with MyChart, go to [email or web address].    Your next appointment:   6 month(s)  The format for your next appointment:   In Person  Provider:   Steven Klein, MD    Other Instructions N/a  Important Information About Sugar       

## 2022-01-22 ENCOUNTER — Encounter: Payer: Self-pay | Admitting: Family Medicine

## 2022-01-22 ENCOUNTER — Other Ambulatory Visit: Payer: Self-pay | Admitting: Family Medicine

## 2022-02-15 ENCOUNTER — Telehealth: Payer: Self-pay | Admitting: Family Medicine

## 2022-02-15 ENCOUNTER — Ambulatory Visit
Admission: RE | Admit: 2022-02-15 | Discharge: 2022-02-15 | Disposition: A | Discharge status: Home / Self Care | Payer: PPO | Source: Ambulatory Visit | Attending: Emergency Medicine | Admitting: Emergency Medicine

## 2022-02-15 VITALS — BP 128/77 | HR 58 | Temp 98.4°F | Resp 18 | Ht 64.0 in

## 2022-02-15 DIAGNOSIS — R300 Dysuria: Secondary | ICD-10-CM | POA: Diagnosis not present

## 2022-02-15 DIAGNOSIS — R350 Frequency of micturition: Secondary | ICD-10-CM | POA: Insufficient documentation

## 2022-02-15 LAB — POCT URINALYSIS DIP (MANUAL ENTRY)
Bilirubin, UA: NEGATIVE
Glucose, UA: NEGATIVE mg/dL
Nitrite, UA: NEGATIVE
Protein Ur, POC: 100 mg/dL — AB
Spec Grav, UA: >=1.03 — AB (ref 1.010–1.025)
Urobilinogen, UA: 1 E.U./dL
pH, UA: 5.5 (ref 5.0–8.0)

## 2022-02-15 MED ORDER — CEPHALEXIN 500 MG PO CAPS: 500.0000 mg | ORAL_CAPSULE | Freq: Two times a day (BID) | ORAL | 0 refills | Status: AC

## 2022-02-15 NOTE — Telephone Encounter (Signed)
Patient was scheduled to see Dr. Darnell Level tomorrow at 11 am

## 2022-02-15 NOTE — Discharge Instructions (Addendum)
Take the antibiotic as directed.  The urine culture is pending.  We will call you if it shows the need to change or discontinue your antibiotic.    Follow up with your primary care provider if your symptoms are not improving.    

## 2022-02-15 NOTE — Telephone Encounter (Signed)
Patient daughter called and stated patient is having blood in her urine and having frequent urination. Patient was sent to access nurse.

## 2022-02-15 NOTE — ED Triage Notes (Signed)
Patient to Urgent Care with daughter, complaints of urinary frequency and some bladder pressure tha started yesterday. Caregiver reports that she noticed some blood splash on the toilet seat this morning (only occurrence). Patient has some dementia, reports caregiver states patient had some confusion yesterday but daughter reports she seems at baseline.

## 2022-02-15 NOTE — Telephone Encounter (Signed)
Pt scheduled to see Dr. Damita Dunnings 02/15/22 @ 11am. Routed to Dr. Damita Dunnings for review.    [facility] - Client TELEPHONE ADVICE RECORD [facility] Patient Name: Vanessa [name] Gender: Female DOB: March 13, 1935 Age: 87 Y 7 M 12 D Return Phone Number: [phone] (Primary), [phone] (Secondary) Address: City/ State/ Zip: [place] [address] Client [facility] - Client Client Site [facility] - Day Provider Renford Dills - MD Contact Type Call Who Is Calling Patient / Member / Family / Caregiver Call Type Triage / Clinical Caller Name Homer Pfeifer Relationship To Patient Daughter Return Phone Number [phone] (Primary) Chief Complaint Urine, Blood In Reason for Call Symptomatic / Request for [place] states her mother has frequent urination and blood in urine. Additional Comment call from office. no appt. available. Translation No Nurse Assessment Nurse: Altamease Oiler, RN, Adriana Date/Time (Eastern Time): 02/15/2022 10:31:45 AM Confirm and document reason for call. If symptomatic, describe symptoms. ---caregiver states pt seemed very confused yesterday, today she had frequency with urination that pt reported started last night. she reports a lot of pressure, pain with urination, blood on toilet seat. pt has history of dementia, does not seem as confused today as yesterday, she is very quiet today which is not her norm Does the patient have any new or worsening symptoms? ---Yes Will a triage be completed? ---Yes Related visit to physician within the last 2 weeks? ---No Does the PT have any chronic conditions? (i.e. diabetes, asthma, this includes High risk factors for pregnancy, etc.) ---Yes List chronic conditions. ---dementia heart disease hypothyroid Is this a behavioral health or substance abuse call? ---No Guidelines Guideline Title Affirmed Question  Affirmed Notes Nurse Date/Time (Eastern Time) Urination Pain - Female Blood in urine (red, pink, or tea-colored) Altamease Oiler, RN, Adriana 02/15/2022 10:34:53 AM PLEASE NOTE: All timestamps contained within this report are represented as [place] Standard Time. CONFIDENTIALTY NOTICE: This fax transmission is intended only for the addressee. It contains information that is legally privileged, confidential or otherwise protected from use or disclosure. If you are not the intended recipient, you are strictly prohibited from reviewing, disclosing, copying using or disseminating any of this information or taking any action in reliance on or regarding this information. If you have received this fax in error, please notify us immediately by telephone so that we can arrange for its return to [place]. Phone: [phone], Toll-Free: [phone], Fax: [phone] Page: 2 of 2 Call Id: [id number] [place]. Time Eilene Ghazi Time) Disposition Final User 02/15/2022 10:42:06 AM Go to ED Now (or PCP triage) Yes Altamease Oiler, RN, Adriana Final Disposition 02/15/2022 10:42:06 AM Go to ED Now (or PCP triage) Yes Altamease Oiler, RN, Adriana Disposition Overriden: See PCP within 24 Hours Override Reason: Patient's symptoms need a higher level of care Caller Disagree/Comply Comply Caller Understands Yes PreDisposition Call Doctor Care Advice Given Per Guideline GO TO ED NOW (OR PCP TRIAGE): * It is better and safer if another adult drives instead of you. ANOTHER ADULT SHOULD DRIVE: CARE ADVICE given per Urination Pain - Female (Adult) guideline. Comments User: Kizzie Fantasia, RN Date/Time Eilene Ghazi Time): 02/15/2022 10:28:09 AM caller not with pt. pt is with caregiver Referrals GO TO [facility] UNDECIDED

## 2022-02-15 NOTE — ED Provider Notes (Signed)
[facility]    CSN: [id number] Arrival date & time: 02/15/22  1309      History   Chief Complaint Chief Complaint  Patient presents with   Urinary Frequency    Urinary Frequency, pressure when urinating and blood in urine - Entered by patient    HPI Vanessa [name] is a 87 y.o. female.  Accompanied by her daughter, patient presents with urinary frequency and bladder pressure x1 day.  She noted some hematuria this morning.  No fever, chills, abdominal pain, flank pain, or other symptoms.  No treatments at home.  Her medical history includes pulmonary hypertension, atrial fibrillation, heart failure, COPD.  The history is provided by the patient, a relative and medical records.    Past Medical History:  Diagnosis Date   Anemia    treated ~2009 with iron, resolved   Arthritis    Cataract    surgery on R catarct 2012   Chronic systolic CHF (congestive heart failure) (HCC)    a. TTE 2012: EF of 45-50%, mild diffuse HK, trivial AI, mild MR, mildly dilated RV with mild reduction of RVSF, mild to moderate TR, mild to moderate PR, mildly elevated PASP   COPD (chronic obstructive pulmonary disease) (HCC)    Family history of adverse reaction to anesthesia    Mother - altered mental status (long term)   Glaucoma    History of pelvic fracture    Hyperglycemia    mild inc in fasting sugar   Hyperlipidemia    Hypothyroidism    Insomnia    Migraines    without aura, sx since age 89   Osteopenia    prev with 10 years of fosamax and intolerant of evista   PAF (paroxysmal atrial fibrillation) (HCC)    a. initially noted 2012; b. CHADS2VASc at least 5 (CHF, age x 2, vasacular disease, female)   Pulmonary hypertension (HCC)    Wears dentures    full upper and lower    Patient Active Problem List   Diagnosis Date Noted   Dysuria 09/24/2021   Dizziness of unknown cause 09/21/2021   B12 deficiency 03/22/2019   Memory change 03/18/2019   Pelvic fracture (HCC) 10/05/2018    Abnormal weight loss 08/19/2017   Hypotension 01/18/2016   Fatigue 07/12/2015   Hyperglycemia 05/25/2014   OA (osteoarthritis) 05/25/2014   Adjustment disorder 01/07/2014   Environmental allergies 12/13/2013   Arthritis 06/27/2013   Carotid stenosis 11/04/2012   Migraine 11/12/2011   Medicare annual wellness visit, subsequent 05/15/2011   Osteopenia 01/25/2011   Insomnia 01/25/2011   Hypothyroid 01/25/2011   Advance directive discussed with patient 01/24/2011   Atrial fibrillation (HCC) 08/17/2010   Sick sinus syndrome (HCC) 08/17/2010   Smoking 08/17/2010   Hyperlipidemia 08/17/2010    Past Surgical History:  Procedure Laterality Date   CATARACT EXTRACTION     CATARACT EXTRACTION W/PHACO Left 08/27/2016   Procedure: CATARACT EXTRACTION PHACO AND INTRAOCULAR LENS PLACEMENT (IOC)  Left;  Surgeon: Lockie Mola, MD;  Location: [facility];  Service: Ophthalmology;  Laterality: Left;   COLONOSCOPY  2012   REFRACTIVE SURGERY      OB History   No obstetric history on file.      Home Medications    Prior to Admission medications   Medication Sig Start Date End Date Taking? Authorizing Provider  cephALEXin (KEFLEX) 500 MG capsule Take 1 capsule (500 mg total) by mouth 2 (two) times daily for 5 days. 02/15/22 02/20/22 Yes Mickie Bail,  NP  Cholecalciferol (VITAMIN D) 50 MCG (2000 UT) CAPS Take 1 capsule (2,000 Units total) by mouth daily. 10/03/18   Tonia Ghent, MD  Cranberry 1000 MG CAPS Take by mouth daily.    [provider]  cyanocobalamin (,VITAMIN B-12,) 1000 MCG/ML injection Inject 1 mL into the muscle every 2 months. 05/08/21   Tonia Ghent, MD  donepezil (ARICEPT) 10 MG tablet TAKE 1/2 TABLET BY MOUTH AT NIGHT FOR 1 MONTH, THEN 1 TABLET AT NIGHT THEREAFTER 01/22/22   Tonia Ghent, MD  flecainide (TAMBOCOR) 50 MG tablet Take 1 tablet (50 mg total) by mouth 2 (two) times daily. 08/21/21   Deboraha Sprang, MD  Insulin Syringe-Needle U-100  (B-D INSULIN SYRINGE 1CC/25GX1") 25G X 1" 1 ML MISC Use with B12 injection 03/22/19   Tonia Ghent, MD  latanoprost (XALATAN) 0.005 % ophthalmic solution Place 1 drop into both eyes at bedtime.    [provider]  levothyroxine (SYNTHROID) 25 MCG tablet TAKE ONE TABLET BY MOUTH EVERY MORNING BEFORE BREAKFAST 04/05/21   Tonia Ghent, MD  traMADol (ULTRAM) 50 MG tablet Take 1 tablet (50 mg total) by mouth 3 (three) times daily as needed. 09/21/21   Tonia Ghent, MD    Family History Family History  Problem Relation Age of Onset   Diabetes Father    Migraines Mother    Stroke Mother        TIAs   Breast cancer Neg Hx    Colon cancer Neg Hx     Social History Social History   Tobacco Use   Smoking status: Former    Packs/day: 0.25    Years: 50.00    Total pack years: 12.50    Types: Cigarettes   Smokeless tobacco: Never   Tobacco comments:    started smoking in 1958- stopped 2020 after a fall  Vaping Use   Vaping Use: Never used  Substance Use Topics   Alcohol use: No   Drug use: No     Allergies   Codeine, Cortisone, Decongestant [pseudoephedrine hcl], Naproxen, and Simvastatin   Review of Systems Review of Systems  Constitutional:  Negative for chills and fever.  Gastrointestinal:  Negative for abdominal pain, constipation, diarrhea, nausea and vomiting.  Genitourinary:  Positive for dysuria, frequency and hematuria.  All other systems reviewed and are negative.    Physical Exam Triage Vital Signs ED Triage Vitals  Enc Vitals Group     BP 02/15/22 1324 128/77     Pulse Rate 02/15/22 1324 (!) 58     Resp 02/15/22 1324 18     Temp 02/15/22 1324 98.4 F (36.9 C)     Temp src --      SpO2 02/15/22 1324 95 %     Weight --      Height 02/15/22 1321 5\' 4"  (1.626 m)     Head Circumference --      Peak Flow --      Pain Score 02/15/22 1320 0     Pain Loc --      Pain Edu? --      Excl. in [place]? --    No data found.  Updated Vital Signs BP  128/77   Pulse (!) 58   Temp 98.4 F (36.9 C)   Resp 18   Ht 5\' 4"  (1.626 m)   SpO2 95%   BMI 20.51 kg/m   Visual Acuity Right Eye Distance:   Left Eye Distance:  Bilateral Distance:    Right Eye Near:   Left Eye Near:    Bilateral Near:     Physical Exam Vitals and nursing note reviewed.  Constitutional:      General: She is not in acute distress.    Appearance: She is well-developed. She is not ill-appearing.  HENT:     Mouth/Throat:     Mouth: Mucous membranes are moist.  Cardiovascular:     Rate and Rhythm: Normal rate and regular rhythm.     Heart sounds: Normal heart sounds.  Pulmonary:     Effort: Pulmonary effort is normal. No respiratory distress.     Breath sounds: Normal breath sounds.  Abdominal:     General: Bowel sounds are normal.     Palpations: Abdomen is soft.     Tenderness: There is no abdominal tenderness. There is no right CVA tenderness, left CVA tenderness, guarding or rebound.  Musculoskeletal:     Cervical back: Neck supple.  Skin:    General: Skin is warm and dry.  Neurological:     Mental Status: She is alert.  Psychiatric:        Mood and Affect: Mood normal.        Behavior: Behavior normal.      UC Treatments / Results  Labs (all labs ordered are listed, but only abnormal results are displayed) Labs Reviewed  POCT URINALYSIS DIP (MANUAL ENTRY) - Abnormal; Notable for the following components:      Result Value   Clarity, UA cloudy (*)    Ketones, POC UA trace (5) (*)    Spec Grav, UA >=1.030 (*)    Blood, UA large (*)    Protein Ur, POC =100 (*)    Leukocytes, UA Small (1+) (*)    All other components within normal limits  URINE CULTURE    EKG   Radiology No results found.  Procedures Procedures (including critical care time)  Medications Ordered in UC Medications - No data to display  Initial Impression / Assessment and Plan / UC Course  I have reviewed the triage vital signs and the nursing  notes.  Pertinent labs & imaging results that were available during my care of the patient were reviewed by me and considered in my medical decision making (see chart for details).   Dysuria, urinary frequency.  Treating with Keflex. Urine culture pending. Discussed with patient that we will call her if the urine culture shows the need to change or discontinue the antibiotic. Instructed her to follow-up with her PCP if her symptoms are not improving. Patient agrees to plan of care.      Final Clinical Impressions(s) / UC Diagnoses   Final diagnoses:  Dysuria  Urinary frequency     Discharge Instructions      Take the antibiotic as directed.  The urine culture is pending.  We will call you if it shows the need to change or discontinue your antibiotic.    Follow up with your primary care provider if your symptoms are not improving.         ED Prescriptions     Medication Sig Dispense Auth. Provider   cephALEXin (KEFLEX) 500 MG capsule Take 1 capsule (500 mg total) by mouth 2 (two) times daily for 5 days. 10 capsule Mickie Bail, NP      PDMP not reviewed this encounter.   Mickie Bail, NP 02/15/22 1352

## 2022-02-16 ENCOUNTER — Ambulatory Visit: Payer: PPO | Admitting: Family Medicine

## 2022-02-16 LAB — URINE CULTURE: Culture: NO GROWTH

## 2022-02-16 NOTE — Telephone Encounter (Signed)
Noted. Thanks.

## 2022-02-23 ENCOUNTER — Encounter: Payer: Self-pay | Admitting: Family Medicine

## 2022-02-23 NOTE — Telephone Encounter (Signed)
Called and spoke with Vanessa [name], pt's daughter listed on DPR.  Pt was seen at Urgent Care for possible UTI on 02/15/2022.  POC UA showed possible UTI but urine culture showed no growth.  When culture results came back clean office called to d/c Keflex 500 mg BID x 5 days.  Vanessa [name] reported that they did not d/c antibiotic and symptoms returned after completing 5 day course. Pt's complaints are some frequency and "tinge" of blood when she wipes.  No fever or complaints of pain with urination or LOC changes. Vanessa [name] has already made an appointment with Dr. Damita Dunnings 02/27/2022 3p. Encouraged Vanessa [name] to call if symptoms get worse or patient has a change in mentation. Note routed to Dr. Damita Dunnings and Janett Billow, [place]  .

## 2022-02-27 ENCOUNTER — Encounter: Payer: Self-pay | Admitting: Family Medicine

## 2022-02-27 ENCOUNTER — Ambulatory Visit (INDEPENDENT_AMBULATORY_CARE_PROVIDER_SITE_OTHER): Payer: PPO | Admitting: Family Medicine

## 2022-02-27 VITALS — BP 116/62 | HR 70 | Temp 96.7°F | Ht 64.0 in | Wt 121.0 lb

## 2022-02-27 DIAGNOSIS — R300 Dysuria: Secondary | ICD-10-CM | POA: Diagnosis not present

## 2022-02-27 LAB — POC URINALSYSI DIPSTICK (AUTOMATED)
Bilirubin, UA: NEGATIVE
Blood, UA: NEGATIVE
Glucose, UA: NEGATIVE
Ketones, UA: POSITIVE
Leukocytes, UA: NEGATIVE
Nitrite, UA: NEGATIVE
Protein, UA: POSITIVE — AB
Spec Grav, UA: 1.02 (ref 1.010–1.025)
Urobilinogen, UA: 1 E.U./dL
pH, UA: 6 (ref 5.0–8.0)

## 2022-02-27 MED ORDER — DONEPEZIL HCL 10 MG PO TABS: 5.0000 mg | ORAL_TABLET | Freq: Every day | ORAL | Status: DC

## 2022-02-27 NOTE — Patient Instructions (Signed)
Try to drink enough water to keep your urine clear or light colored.  We'll update you about the urine culture.   If you have more troubles then let me know.  Take care.  Glad to see you.

## 2022-02-27 NOTE — Progress Notes (Unsigned)
S/p UC eval and took 5 days of keflex.  Prev ucx neg.  Prev u/a abnormal.  Discussed with patient and daughter.  She had isolated sx on 02/23/22, with blood/urgency but no pain.  No abd pain.  No fevers.  No blood seen in urine today or in the meantime.  She rolled off sofa last night, while asleep.  Wasn't a fall when waking.  No injury.    U/a from today d/w pt, ucx pending.    Meds, vitals, and allergies reviewed.   ROS: Per HPI unless specifically indicated in ROS section   GEN: nad, alert and pleasant in conversation. HEENT: ncat NECK: supple w/o LA CV: rrr.  PULM: ctab, no inc wob ABD: soft, +bs, nontender to palpation.  No CVA pain. EXT: no edema SKIN: Well-perfused.

## 2022-02-28 LAB — URINE CULTURE
MICRO NUMBER:: 14155044
SPECIMEN QUALITY:: ADEQUATE

## 2022-02-28 NOTE — Assessment & Plan Note (Signed)
Several issues to consider.  Her urinalysis is improved today.  It is not completely normal but she does not have urgency and she has no blood in her urine today.  She has not seen blood in her urine for the past few days.  I think it makes sense to get her urine culture resulted and address that if positive.  If her urine culture is negative and she has further symptoms, then that would direct further work-up.  If her urine culture is negative and she continues without symptoms then I think it makes sense to defer extra intervention at this point.  Discussed with patient and daughter and they agree.  Will await report back from patient if she has new symptoms in the interval and will await her urine culture.

## 2022-04-04 ENCOUNTER — Encounter: Payer: Self-pay | Admitting: Family Medicine

## 2022-04-04 DIAGNOSIS — E039 Hypothyroidism, unspecified: Secondary | ICD-10-CM

## 2022-04-05 MED ORDER — LEVOTHYROXINE SODIUM 25 MCG PO TABS: 25.0000 ug | ORAL_TABLET | Freq: Every day | ORAL | 1 refills | Status: DC

## 2022-05-08 ENCOUNTER — Encounter: Payer: Self-pay | Admitting: Family Medicine

## 2022-05-08 ENCOUNTER — Ambulatory Visit: Payer: Medicare PPO | Admitting: Family Medicine

## 2022-05-08 VITALS — BP 122/78 | HR 70 | Temp 97.2°F | Ht 64.0 in | Wt 123.0 lb

## 2022-05-08 DIAGNOSIS — R0602 Shortness of breath: Secondary | ICD-10-CM | POA: Diagnosis not present

## 2022-05-08 DIAGNOSIS — R413 Other amnesia: Secondary | ICD-10-CM | POA: Diagnosis not present

## 2022-05-08 DIAGNOSIS — M1990 Unspecified osteoarthritis, unspecified site: Secondary | ICD-10-CM

## 2022-05-08 DIAGNOSIS — E538 Deficiency of other specified B group vitamins: Secondary | ICD-10-CM

## 2022-05-08 DIAGNOSIS — M8580 Other specified disorders of bone density and structure, unspecified site: Secondary | ICD-10-CM

## 2022-05-08 DIAGNOSIS — E782 Mixed hyperlipidemia: Secondary | ICD-10-CM

## 2022-05-08 DIAGNOSIS — E039 Hypothyroidism, unspecified: Secondary | ICD-10-CM

## 2022-05-08 LAB — CBC WITH DIFFERENTIAL/PLATELET
Basophils Absolute: 0 10*3/uL (ref 0.0–0.1)
Basophils Relative: 0.6 % (ref 0.0–3.0)
Eosinophils Absolute: 0.2 10*3/uL (ref 0.0–0.7)
Eosinophils Relative: 3 % (ref 0.0–5.0)
HCT: 40.3 % (ref 36.0–46.0)
Hemoglobin: 13.1 g/dL (ref 12.0–15.0)
Lymphocytes Relative: 20.8 % (ref 12.0–46.0)
Lymphs Abs: 1.6 10*3/uL (ref 0.7–4.0)
MCHC: 32.6 g/dL (ref 30.0–36.0)
MCV: 93.5 fl (ref 78.0–100.0)
Monocytes Absolute: 0.7 10*3/uL (ref 0.1–1.0)
Monocytes Relative: 8.3 % (ref 3.0–12.0)
Neutro Abs: 5.3 10*3/uL (ref 1.4–7.7)
Neutrophils Relative %: 67.3 % (ref 43.0–77.0)
Platelets: 340 10*3/uL (ref 150.0–400.0)
RBC: 4.31 Mil/uL (ref 3.87–5.11)
RDW: 13.7 % (ref 11.5–15.5)
WBC: 7.9 10*3/uL (ref 4.0–10.5)

## 2022-05-08 LAB — LIPID PANEL
Cholesterol: 226 mg/dL — ABNORMAL HIGH (ref 0–200)
HDL: 62.3 mg/dL (ref >39.00)
LDL Cholesterol: 137 mg/dL — ABNORMAL HIGH (ref 0–99)
NonHDL: 163.95
Total CHOL/HDL Ratio: 4
Triglycerides: 133 mg/dL (ref 0.0–149.0)
VLDL: 26.6 mg/dL (ref 0.0–40.0)

## 2022-05-08 LAB — COMPREHENSIVE METABOLIC PANEL
ALT: 11 U/L (ref 0–35)
AST: 22 U/L (ref 0–37)
Albumin: 4.2 g/dL (ref 3.5–5.2)
Alkaline Phosphatase: 53 U/L (ref 39–117)
BUN: 19 mg/dL (ref 6–23)
CO2: 30 mEq/L (ref 19–32)
Calcium: 9.6 mg/dL (ref 8.4–10.5)
Chloride: 100 mEq/L (ref 96–112)
Creatinine, Ser: 0.95 mg/dL (ref 0.40–1.20)
GFR: 54.6 mL/min — ABNORMAL LOW (ref >60.00)
Glucose, Bld: 108 mg/dL — ABNORMAL HIGH (ref 70–99)
Potassium: 4.3 mEq/L (ref 3.5–5.1)
Sodium: 138 mEq/L (ref 135–145)
Total Bilirubin: 0.4 mg/dL (ref 0.2–1.2)
Total Protein: 6.4 g/dL (ref 6.0–8.3)

## 2022-05-08 LAB — VITAMIN D 25 HYDROXY (VIT D DEFICIENCY, FRACTURES): VITD: 68.44 ng/mL (ref 30.00–100.00)

## 2022-05-08 LAB — VITAMIN B12: Vitamin B-12: 448 pg/mL (ref 211–911)

## 2022-05-08 LAB — TSH: TSH: 4.52 u[IU]/mL (ref 0.35–5.50)

## 2022-05-08 LAB — BRAIN NATRIURETIC PEPTIDE: Pro B Natriuretic peptide (BNP): 158 pg/mL — ABNORMAL HIGH (ref 0.0–100.0)

## 2022-05-08 MED ORDER — TRAMADOL HCL 50 MG PO TABS: 50.0000 mg | ORAL_TABLET | Freq: Three times a day (TID) | ORAL | 1 refills | Status: DC | PRN

## 2022-05-08 MED ORDER — LEVOTHYROXINE SODIUM 25 MCG PO TABS: 25.0000 ug | ORAL_TABLET | Freq: Every day | ORAL | 1 refills | Status: DC

## 2022-05-08 NOTE — Patient Instructions (Signed)
Go to the lab on the way out.   If you have mychart we'll likely use that to update you.    If your labs are abnormal, then we'll address that.  If your labs are normal, then you will likely need more help at home as you go along.   Take care.  Glad to see you.

## 2022-05-08 NOTE — Progress Notes (Signed)
Taking tramadol for joint pain and migraines.  It helps.  No ADE on med.    Hypothyroidism.  Pliant with replacement.  Due for labs.  No dysphagia.  Memory loss. On aricept at baseline.  Family is helping out, she has caregivers in the house.  "Memory about the same" per patient, family noted more trouble with operational issues, ie following mult commands or working a remote command.  Discussed her goals.  She wants to stay at home we talked about making sure she had extra help at home, likely with the need for escalation of care at home.  She has a life alert button.  Family has worked to make the home environment safer in the meantime.  H/o palpitations on flecainide per cards.   SOBOE.  No CP.    H/o B12 def.  Off replacement.  Follow-up labs pending.  Meds, vitals, and allergies reviewed.   ROS: Per HPI unless specifically indicated in ROS section   GEN: nad, alert and pleasant in conversation. HEENT: mucous membranes moist NECK: supple w/o LA CV: rrr.  PULM: ctab, no inc wob ABD: soft, +bs EXT: no edema SKIN: no acute rash   She thought the year was 1985 Doesn't know the month.  3/3 initially on word repeat.   Can do basic math Can read a watch 0/3 recall.    30 minutes were devoted to patient care in this encounter (this includes time spent reviewing the patient's file/history, interviewing and examining the patient, counseling/reviewing plan with patient).

## 2022-05-09 NOTE — Assessment & Plan Note (Signed)
Continue Aricept.  She thought the year was [birth year] Doesn't know the month.  3/3 initially on word repeat.   Can do basic math Can read a watch 0/3 recall.    She will stay at home.  Discussed with the daughter about escalating care at home to provide for patient's safety.

## 2022-05-09 NOTE — Assessment & Plan Note (Signed)
Continue levothyroxine.  See notes on follow-up labs.

## 2022-05-09 NOTE — Assessment & Plan Note (Signed)
See notes on follow-up labs. 

## 2022-05-09 NOTE — Assessment & Plan Note (Addendum)
Continue as needed tramadol.  This helped some.  She is not taking it every day.  It does not appear that this is affecting her memory, as she does not have significant memory changes noted when she does or does not take the medication.

## 2022-05-13 ENCOUNTER — Other Ambulatory Visit: Payer: Self-pay | Admitting: Family Medicine

## 2022-05-13 MED ORDER — FUROSEMIDE 20 MG PO TABS: 20.0000 mg | ORAL_TABLET | Freq: Every day | ORAL | 0 refills | Status: DC | PRN

## 2022-05-22 ENCOUNTER — Other Ambulatory Visit: Payer: Self-pay | Admitting: Family Medicine

## 2022-05-23 ENCOUNTER — Encounter: Payer: Self-pay | Admitting: Family Medicine

## 2022-05-25 ENCOUNTER — Other Ambulatory Visit: Payer: Self-pay | Admitting: Family Medicine

## 2022-05-25 DIAGNOSIS — R0602 Shortness of breath: Secondary | ICD-10-CM

## 2022-05-25 MED ORDER — FUROSEMIDE 20 MG PO TABS: ORAL_TABLET | ORAL | 0 refills | Status: DC

## 2022-05-28 ENCOUNTER — Other Ambulatory Visit (INDEPENDENT_AMBULATORY_CARE_PROVIDER_SITE_OTHER): Payer: PPO

## 2022-05-28 DIAGNOSIS — R0602 Shortness of breath: Secondary | ICD-10-CM

## 2022-05-28 LAB — BASIC METABOLIC PANEL
BUN: 18 mg/dL (ref 6–23)
CO2: 32 mEq/L (ref 19–32)
Calcium: 9 mg/dL (ref 8.4–10.5)
Chloride: 99 mEq/L (ref 96–112)
Creatinine, Ser: 1.15 mg/dL (ref 0.40–1.20)
GFR: 43.4 mL/min — ABNORMAL LOW (ref >60.00)
Glucose, Bld: 196 mg/dL — ABNORMAL HIGH (ref 70–99)
Potassium: 3.5 mEq/L (ref 3.5–5.1)
Sodium: 139 mEq/L (ref 135–145)

## 2022-05-28 LAB — BRAIN NATRIURETIC PEPTIDE: Pro B Natriuretic peptide (BNP): 76 pg/mL (ref 0.0–100.0)

## 2022-05-30 ENCOUNTER — Other Ambulatory Visit: Payer: Self-pay | Admitting: Family Medicine

## 2022-06-01 ENCOUNTER — Telehealth: Payer: Self-pay | Admitting: Family Medicine

## 2022-06-01 NOTE — Telephone Encounter (Signed)
Patient daughter called in returning a call she received. Thank you!

## 2022-06-01 NOTE — Telephone Encounter (Signed)
Spoke with daughter about results.

## 2022-06-13 ENCOUNTER — Ambulatory Visit (INDEPENDENT_AMBULATORY_CARE_PROVIDER_SITE_OTHER): Payer: PPO

## 2022-06-13 VITALS — Ht 64.0 in | Wt 123.0 lb

## 2022-06-13 DIAGNOSIS — Z0000 Encounter for general adult medical examination without abnormal findings: Secondary | ICD-10-CM | POA: Diagnosis not present

## 2022-06-13 NOTE — Patient Instructions (Signed)
[name] , Thank you for taking time to come for your Medicare Wellness Visit. I appreciate your ongoing commitment to your health goals. Please review the following plan we discussed and let me know if I can assist you in the future.   These are the goals we discussed:  Goals      Increase physical activity     When weather permits, I will continue to walk at least 60 min daily.         This is a list of the screening recommended for you and due dates:  Health Maintenance  Topic Date Due   Zoster (Shingles) Vaccine (1 of 2) Never done   COVID-19 Vaccine (4 - 2023-24 season) 12/22/2021   DTaP/Tdap/Td vaccine (2 - Td or Tdap) 01/06/2022   Mammogram  07/10/2025*   Medicare Annual Wellness Visit  06/14/2023   Pneumonia Vaccine  Completed   Flu Shot  Completed   DEXA scan (bone density measurement)  Completed   HPV Vaccine  Aged Out  *Topic was postponed. The date shown is not the original due date.    Advanced directives: yes  Conditions/risks identified: low falls risk  Next appointment: Follow up in one year for your annual wellness visit 06/20/2023 @2$ :30pm telephone   Preventive Care 65 Years and Older, Female Preventive care refers to lifestyle choices and visits with your health care provider that can promote health and wellness. What does preventive care include? A yearly physical exam. This is also called an annual well check. Dental exams once or twice a year. Routine eye exams. Ask your health care provider how often you should have your eyes checked. Personal lifestyle choices, including: Daily care of your teeth and gums. Regular physical activity. Eating a healthy diet. Avoiding tobacco and drug use. Limiting alcohol use. Practicing safe sex. Taking low-dose aspirin every day. Taking vitamin and mineral supplements as recommended by your health care provider. What happens during an annual well check? The services and screenings done by your health care  provider during your annual well check will depend on your age, overall health, lifestyle risk factors, and family history of disease. Counseling  Your health care provider may ask you questions about your: Alcohol use. Tobacco use. Drug use. Emotional well-being. Home and relationship well-being. Sexual activity. Eating habits. History of falls. Memory and ability to understand (cognition). Work and work Statistician. Reproductive health. Screening  You may have the following tests or measurements: Height, weight, and BMI. Blood pressure. Lipid and cholesterol levels. These may be checked every 5 years, or more frequently if you are over 71 years old. Skin check. Lung cancer screening. You may have this screening every year starting at age 70 if you have a 30-pack-year history of smoking and currently smoke or have quit within the past 15 years. Fecal occult blood test (FOBT) of the stool. You may have this test every year starting at age 3. Flexible sigmoidoscopy or colonoscopy. You may have a sigmoidoscopy every 5 years or a colonoscopy every 10 years starting at age 85. Hepatitis C blood test. Hepatitis B blood test. Sexually transmitted disease (STD) testing. Diabetes screening. This is done by checking your blood sugar (glucose) after you have not eaten for a while (fasting). You may have this done every 1-3 years. Bone density scan. This is done to screen for osteoporosis. You may have this done starting at age 67. Mammogram. This may be done every 1-2 years. Talk to your health care provider about  how often you should have regular mammograms. Talk with your health care provider about your test results, treatment options, and if necessary, the need for more tests. Vaccines  Your health care provider may recommend certain vaccines, such as: Influenza vaccine. This is recommended every year. Tetanus, diphtheria, and acellular pertussis (Tdap, Td) vaccine. You may need a Td  booster every 10 years. Zoster vaccine. You may need this after age 98. Pneumococcal 13-valent conjugate (PCV13) vaccine. One dose is recommended after age 72. Pneumococcal polysaccharide (PPSV23) vaccine. One dose is recommended after age 59. Talk to your health care provider about which screenings and vaccines you need and how often you need them. This information is not intended to replace advice given to you by your health care provider. Make sure you discuss any questions you have with your health care provider. Document Released: 05/06/2015 Document Revised: 12/28/2015 Document Reviewed: 02/08/2015 Elsevier Interactive Patient Education  2017 [place] Prevention in the Home Falls can cause injuries. They can happen to people of all ages. There are many things you can do to make your home safe and to help prevent falls. What can I do on the outside of my home? Regularly fix the edges of walkways and driveways and fix any cracks. Remove anything that might make you trip as you walk through a door, such as a raised step or threshold. Trim any bushes or trees on the path to your home. Use bright outdoor lighting. Clear any walking paths of anything that might make someone trip, such as rocks or tools. Regularly check to see if handrails are loose or broken. Make sure that both sides of any steps have handrails. Any raised decks and porches should have guardrails on the edges. Have any leaves, snow, or ice cleared regularly. Use sand or salt on walking paths during winter. Clean up any spills in your garage right away. This includes oil or grease spills. What can I do in the bathroom? Use night lights. Install grab bars by the toilet and in the tub and shower. Do not use towel bars as grab bars. Use non-skid mats or decals in the tub or shower. If you need to sit down in the shower, use a plastic, non-slip stool. Keep the floor dry. Clean up any water that spills on the floor  as soon as it happens. Remove soap buildup in the tub or shower regularly. Attach bath mats securely with double-sided non-slip rug tape. Do not have throw rugs and other things on the floor that can make you trip. What can I do in the bedroom? Use night lights. Make sure that you have a light by your bed that is easy to reach. Do not use any sheets or blankets that are too big for your bed. They should not hang down onto the floor. Have a firm chair that has side arms. You can use this for support while you get dressed. Do not have throw rugs and other things on the floor that can make you trip. What can I do in the kitchen? Clean up any spills right away. Avoid walking on wet floors. Keep items that you use a lot in easy-to-reach places. If you need to reach something above you, use a strong step stool that has a grab bar. Keep electrical cords out of the way. Do not use floor polish or wax that makes floors slippery. If you must use wax, use non-skid floor wax. Do not have throw rugs and other  things on the floor that can make you trip. What can I do with my stairs? Do not leave any items on the stairs. Make sure that there are handrails on both sides of the stairs and use them. Fix handrails that are broken or loose. Make sure that handrails are as long as the stairways. Check any carpeting to make sure that it is firmly attached to the stairs. Fix any carpet that is loose or worn. Avoid having throw rugs at the top or bottom of the stairs. If you do have throw rugs, attach them to the floor with carpet tape. Make sure that you have a light switch at the top of the stairs and the bottom of the stairs. If you do not have them, ask someone to add them for you. What else can I do to help prevent falls? Wear shoes that: Do not have high heels. Have rubber bottoms. Are comfortable and fit you well. Are closed at the toe. Do not wear sandals. If you use a stepladder: Make sure that it is  fully opened. Do not climb a closed stepladder. Make sure that both sides of the stepladder are locked into place. Ask someone to hold it for you, if possible. Clearly mark and make sure that you can see: Any grab bars or handrails. First and last steps. Where the edge of each step is. Use tools that help you move around (mobility aids) if they are needed. These include: Canes. Walkers. Scooters. Crutches. Turn on the lights when you go into a dark area. Replace any light bulbs as soon as they burn out. Set up your furniture so you have a clear path. Avoid moving your furniture around. If any of your floors are uneven, fix them. If there are any pets around you, be aware of where they are. Review your medicines with your doctor. Some medicines can make you feel dizzy. This can increase your chance of falling. Ask your doctor what other things that you can do to help prevent falls. This information is not intended to replace advice given to you by your health care provider. Make sure you discuss any questions you have with your health care provider. Document Released: 02/03/2009 Document Revised: 09/15/2015 Document Reviewed: 05/14/2014 Elsevier Interactive Patient Education  2017 [employer].

## 2022-06-13 NOTE — Progress Notes (Signed)
I connected with  Vanessa [name] on 06/13/22 by a audio enabled telemedicine application and verified that I am speaking with the correct person using two identifiers.  Patient Location: Home  Provider Location: Office/Clinic  I discussed the limitations of evaluation and management by telemedicine. The patient expressed understanding and agreed to proceed.  Subjective:   Vanessa [name] is a 88 y.o. female who presents for Medicare Annual (Subsequent) preventive examination.  Review of Systems    Cardiac Risk Factors include: advanced age (>93mn, >>11women);dyslipidemia;sedentary lifestyle    Objective:    Today's Vitals   06/13/22 1538  Weight: 123 lb (55.8 kg)  Height: 5' 4"$  (1.626 m)   Body mass index is 21.11 kg/m.     06/13/2022    3:47 PM 02/15/2022    1:21 PM 05/29/2021    3:14 PM 09/30/2018    2:50 PM 08/27/2016    7:56 AM 07/10/2016   11:56 AM 07/07/2015    1:45 PM  Advanced Directives  Does Patient Have a Medical Advance Directive? Yes Yes No No Yes Yes Yes  Type of [place]porate [place] will   [place] will  Does patient want to make changes to medical advance directive?       No - Patient declined  Copy of [place] Chart?     No - copy requested No - copy requested No - copy requested    Current Medications (verified) Outpatient Encounter Medications as of 06/13/2022  Medication Sig   Cholecalciferol (VITAMIN D) 50 MCG (2000 UT) CAPS Take 1 capsule (2,000 Units total) by mouth daily.   Cranberry 1000 MG CAPS Take by mouth daily.   donepezil (ARICEPT) 10 MG tablet Take 0.5 tablets (5 mg total) by mouth at bedtime.   flecainide (TAMBOCOR) 50 MG tablet Take 1 tablet (50 mg total) by mouth 2 (two) times daily.   furosemide (LASIX) 20 MG tablet TAKE 1 TABLET(20 MG) BY MOUTH DAILY AS NEEDED   latanoprost  (XALATAN) 0.005 % ophthalmic solution Place 1 drop into both eyes at bedtime.   levothyroxine (SYNTHROID) 25 MCG tablet Take 1 tablet (25 mcg total) by mouth daily with breakfast.   traMADol (ULTRAM) 50 MG tablet Take 1 tablet (50 mg total) by mouth 3 (three) times daily as needed.   No facility-administered encounter medications on file as of 06/13/2022.    Allergies (verified) Codeine, Cortisone, Decongestant [pseudoephedrine hcl], Naproxen, and Simvastatin   History: Past Medical History:  Diagnosis Date   Anemia    treated ~2009 with iron, resolved   Arthritis    Cataract    surgery on R catarct 20000000  Chronic systolic CHF (congestive heart failure) ([facility]    a. TTE 2012: EF of 45-50%, mild diffuse HK, trivial AI, mild MR, mildly dilated RV with mild reduction of RVSF, mild to moderate TR, mild to moderate PR, mildly elevated PASP   COPD (chronic obstructive pulmonary disease) (HCC)    Family history of adverse reaction to anesthesia    Mother - altered mental status (long term)   Glaucoma    History of pelvic fracture    Hyperglycemia    mild inc in fasting sugar   Hyperlipidemia    Hypothyroidism    Insomnia    Migraines    without aura, sx since age 88  Osteopenia    prev with 10  years of fosamax and intolerant of evista   PAF (paroxysmal atrial fibrillation) ([place])    a. initially noted 2012; b. CHADS2VASc at least 5 (CHF, age x 2, vasacular disease, female)   Pulmonary hypertension ([place])    Wears dentures    full upper and lower   Past Surgical History:  Procedure Laterality Date   CATARACT EXTRACTION     CATARACT EXTRACTION W/PHACO Left 08/27/2016   Procedure: CATARACT EXTRACTION PHACO AND INTRAOCULAR LENS PLACEMENT ([place])  Left;  Surgeon: Leandrew Koyanagi, MD;  Location: [facility];  Service: Ophthalmology;  Laterality: Left;   COLONOSCOPY  2012   REFRACTIVE SURGERY     Family History  Problem Relation Age of Onset   Diabetes Father    Migraines  Mother    Stroke Mother        TIAs   Breast cancer Neg Hx    Colon cancer Neg Hx    Social History   Socioeconomic History   Marital status: Widowed    Spouse name: Not on file   Number of children: Not on file   Years of education: Not on file   Highest education level: Not on file  Occupational History   Not on file  Tobacco Use   Smoking status: Former    Packs/day: 0.25    Years: 50.00    Total pack years: 12.50    Types: Cigarettes   Smokeless tobacco: Never   Tobacco comments:    started smoking in 1958- stopped 2020 after a fall  Vaping Use   Vaping Use: Never used  Substance and Sexual Activity   Alcohol use: No   Drug use: No   Sexual activity: Never  Other Topics Concern   Not on file  Social History Narrative   From [place].     Worked for town of [place]   Widow, 2006.  Was married for 47 years.     Active in church and senior groups.     Likes gospel singing.     Lives alone (with her dog Cloyde Reams)   4 kids, all are nearby.     Social Determinants of Health   Financial Resource Strain: Low Risk  (06/13/2022)   Overall Financial Resource Strain (CARDIA)    Difficulty of Paying Living Expenses: Not hard at all  Food Insecurity: No Food Insecurity (06/13/2022)   Hunger Vital Sign    Worried About Running Out of Food in the Last Year: Never true    Ran Out of Food in the Last Year: Never true  Transportation Needs: No Transportation Needs (06/13/2022)   PRAPARE - Hydrologist (Medical): No    Lack of Transportation (Non-Medical): No  Physical Activity: Inactive (06/13/2022)   Exercise Vital Sign    Days of Exercise per Week: 0 days    Minutes of Exercise per Session: 0 min  Stress: No Stress Concern Present (06/13/2022)   [place]    Feeling of Stress : Not at all  Social Connections: Moderately Isolated (06/13/2022)   Social Connection and Isolation  Panel [NHANES]    Frequency of Communication with Friends and Family: More than three times a week    Frequency of Social Gatherings with Friends and Family: More than three times a week    Attends Religious Services: More than 4 times per year    Active Member of Clubs or Organizations: No    Attends  Club or Organization Meetings: Never    Marital Status: Widowed    Tobacco Counseling Counseling given: Not Answered Tobacco comments: started smoking in 1958- stopped 2020 after a fall   Clinical Intake:  Pre-visit preparation completed: Yes  Pain : No/denies pain     BMI - recorded: 21.11 Nutritional Status: BMI of 19-24  Normal Nutritional Risks: None Diabetes: No  How often do you need to have someone help you when you read instructions, pamphlets, or other written materials from your doctor or pharmacy?: 1 - Never  Diabetic?no     Information entered by :: B.Wesly Whisenant,LPN   Activities of Daily Living    06/13/2022    3:52 PM  In your present state of health, do you have any difficulty performing the following activities:  Hearing? 0  Vision? 0  Difficulty concentrating or making decisions? 1  Comment some  Walking or climbing stairs? 1  Dressing or bathing? 0  Doing errands, shopping? 1  Comment daughter helps  Preparing Food and eating ? N  Using the Toilet? N  In the past six months, have you accidently leaked urine? N  Do you have problems with loss of bowel control? N  Managing your Medications? N  Managing your Finances? N  Housekeeping or managing your Housekeeping? N    Patient Care Team: Tonia Ghent, MD as PCP - General (Family Medicine) Leandrew Koyanagi, MD as Referring Physician (Ophthalmology) Minna Merritts, MD as Consulting Physician (Cardiology)  Indicate any recent Medical Services you may have received from other than [facility] providers in the past year (date may be approximate).     Assessment:   This is a routine wellness  examination for [place].  Hearing/Vision screen Hearing Screening - Comments:: Adequate hearing Vision Screening - Comments:: Adequate vision;readers only Dr [facility]  Dietary issues and exercise activities discussed: Current Exercise Habits: The patient does not participate in regular exercise at present, Exercise limited by: orthopedic condition(s)   Goals Addressed             This Visit's Progress    Increase physical activity   Not on track    When weather permits, I will continue to walk at least 60 min daily.        Depression Screen    06/13/2022    3:42 PM 05/08/2022   12:30 PM 05/04/2021   12:50 PM 03/22/2020    8:31 AM 03/16/2019    8:58 AM 07/22/2017    4:52 PM 07/19/2017   10:38 AM  PHQ 2/9 Scores  PHQ - 2 Score 0 3 0 0 0 0 0  PHQ- 9 Score 0 15         Fall Risk    06/13/2022    3:40 PM 05/08/2022   12:30 PM 05/04/2021   12:50 PM 03/22/2020    8:31 AM 11/19/2019   10:22 AM  Fall Risk   Falls in the past year? 0 1 1 0 1  Comment     Emmi Telephone Survey: data to providers prior to load  Number falls in past yr: 0 1 1 0 1  Comment     Emmi Telephone Survey Actual Response = 1  Injury with Fall? 0 0 0 0 1  Risk for fall due to : No Fall Risks History of fall(s);Impaired balance/gait History of fall(s);Impaired balance/gait    Follow up Education provided;Falls prevention discussed Falls evaluation completed Falls evaluation completed Falls evaluation completed  FALL RISK PREVENTION PERTAINING TO THE HOME:  Any stairs in or around the home? Yes  If so, are there any without handrails? Yes  Home free of loose throw rugs in walkways, pet beds, electrical cords, etc? Yes  Adequate lighting in your home to reduce risk of falls? Yes   ASSISTIVE DEVICES UTILIZED TO PREVENT FALLS:  Life alert? Yes  Use of a cane, walker or w/c? Yes w/c for far walking; walker at home if needed Grab bars in the bathroom? Yes  Shower chair or bench in shower?  Yes  Elevated toilet seat or a handicapped toilet? Yes    Cognitive Function:    07/10/2016   11:19 AM 07/07/2015    2:11 PM  MMSE - Mini Mental State Exam  Orientation to time 5 5  Orientation to Place 5 5  Registration 3 3  Attention/ Calculation 0 5  Recall 3 3  Language- name 2 objects 0 0  Language- repeat 1 1  Language- follow 3 step command 3 3  Language- read & follow direction 0 1  Write a sentence 0 0  Copy design 0 0  Total score 20 26        06/13/2022    3:47 PM  6CIT Screen  What Year? 0 points  What month? 0 points  What time? 0 points  Count back from 20 0 points  Months in reverse 0 points  Repeat phrase 0 points  Total Score 0 points    Immunizations Immunization History  Administered Date(s) Administered   Influenza Inj Mdck Quad Pf 01/28/2019   Influenza Split 01/25/2011, 01/24/2012, 01/21/2013   Influenza, High Dose Seasonal PF 03/15/2021, 01/08/2022   Influenza,inj,Quad PF,6+ Mos 01/14/2017   Influenza-Unspecified 01/20/2014, 01/19/2016, 01/17/2018, 01/22/2020   Moderna Sars-Covid-2 Vaccination 05/06/2019, 06/03/2019, 03/07/2020   Pneumococcal Conjugate-13 05/24/2014   Pneumococcal Polysaccharide-23 05/14/2011   Tdap 01/07/2012    TDAP status: Up to date  Flu Vaccine status: Up to date  Pneumococcal vaccine status: Up to date  Covid-19 vaccine status: Completed vaccines  Qualifies for Shingles Vaccine? Yes   Zostavax completed Yes   Shingrix Completed?: No.    Education has been provided regarding the importance of this vaccine. Patient has been advised to call insurance company to determine out of pocket expense if they have not yet received this vaccine. Advised may also receive vaccine at local pharmacy or Health Dept. Verbalized acceptance and understanding.  Screening Tests Health Maintenance  Topic Date Due   Zoster Vaccines- Shingrix (1 of 2) Never done   COVID-19 Vaccine (4 - 2023-24 season) 12/22/2021   DTaP/Tdap/Td (2 -  Td or Tdap) 01/06/2022   MAMMOGRAM  07/10/2025 (Originally 08/13/2014)   Medicare Annual Wellness (AWV)  06/14/2023   Pneumonia Vaccine 60+ Years old  Completed   INFLUENZA VACCINE  Completed   DEXA SCAN  Completed   HPV VACCINES  Aged Out    Health Maintenance  Health Maintenance Due  Topic Date Due   Zoster Vaccines- Shingrix (1 of 2) Never done   COVID-19 Vaccine (4 - 2023-24 season) 12/22/2021   DTaP/Tdap/Td (2 - Td or Tdap) 01/06/2022    Colorectal cancer screening: No longer required.   Mammogram status: No longer required due to age.  Lung Cancer Screening: (Low Dose CT Chest recommended if Age 83-80 years, 30 pack-year currently smoking OR have quit w/in 15years.) does not qualify.   Lung Cancer Screening Referral: no  Additional Screening:  Hepatitis C Screening: does not  qualify; Completed yes  Vision Screening: Recommended annual ophthalmology exams for early detection of glaucoma and other disorders of the eye. Is the patient up to date with their annual eye exam?  Yes  Who is the provider or what is the name of the office in which the patient attends annual eye exams? [facility] Dr Mordecai Rasmussen If pt is not established with a provider, would they like to be referred to a provider to establish care? No .   Dental Screening: Recommended annual dental exams for proper oral hygiene  Community Resource Referral / Chronic Care Management: CRR required this visit?  No   CCM required this visit?  No      Plan:     I have personally reviewed and noted the following in the patient's chart:   Medical and social history Use of alcohol, tobacco or illicit drugs  Current medications and supplements including opioid prescriptions. Patient is currently taking opioid prescriptions. Information provided to patient regarding non-opioid alternatives. Patient advised to discuss non-opioid treatment plan with their provider. Functional ability and status Nutritional  status Physical activity Advanced directives List of other physicians Hospitalizations, surgeries, and ER visits in previous 12 months Vitals Screenings to include cognitive, depression, and falls Referrals and appointments  In addition, I have reviewed and discussed with patient certain preventive protocols, quality metrics, and best practice recommendations. A written personalized care plan for preventive services as well as general preventive health recommendations were provided to patient.     Roger Shelter, LPN   [id number]   Nurse Notes: pt states she is doing well: had no concerns or questions during this visit.

## 2022-06-26 ENCOUNTER — Other Ambulatory Visit: Payer: Self-pay | Admitting: Family Medicine

## 2022-07-26 ENCOUNTER — Ambulatory Visit: Payer: PPO | Attending: Internal Medicine | Admitting: Internal Medicine

## 2022-07-26 ENCOUNTER — Encounter: Payer: Self-pay | Admitting: Internal Medicine

## 2022-07-26 VITALS — BP 112/74 | HR 70 | Ht 64.0 in | Wt 121.0 lb

## 2022-07-26 DIAGNOSIS — Z79899 Other long term (current) drug therapy: Secondary | ICD-10-CM | POA: Diagnosis not present

## 2022-07-26 DIAGNOSIS — J8410 Pulmonary fibrosis, unspecified: Secondary | ICD-10-CM | POA: Diagnosis not present

## 2022-07-26 DIAGNOSIS — R001 Bradycardia, unspecified: Secondary | ICD-10-CM | POA: Diagnosis not present

## 2022-07-26 DIAGNOSIS — I4719 Other supraventricular tachycardia: Secondary | ICD-10-CM | POA: Diagnosis not present

## 2022-07-26 DIAGNOSIS — R42 Dizziness and giddiness: Secondary | ICD-10-CM | POA: Diagnosis not present

## 2022-07-26 MED ORDER — FLECAINIDE ACETATE 50 MG PO TABS: 50.0000 mg | ORAL_TABLET | Freq: Two times a day (BID) | ORAL | 3 refills | Status: DC

## 2022-07-26 NOTE — Progress Notes (Signed)
Patient Care Team: Tonia Ghent, MD as PCP - General (Family Medicine) Leandrew Koyanagi, MD as Referring Physician (Ophthalmology) Minna Merritts, MD as Consulting Physician (Cardiology)   HPI  Vanessa [name] is a 88 y.o. female Seen in follow-up for atrial tachycardia.  I saw her 11/12/2017 and failed to write a note.  She had been seen earlier that day by RD-PA.  She had been noted to have "atrial fibrillation" and some degree of bradycardia.  Heart rates in AF were in the 120s and in sinus rhythm were in the 50s.  Zio monitor had demonstrated recurrent episodes of tachycardia up to 170.  We ended up discontinuing her Cardizem and beginning her on flecainide>> review of the strips suggested that it was more of an atrial tachycardia than atrial fibrillation and we discontinued her Eliquis  Saw Dr. Curt Bears she declined ablation.  Has been maintained on flecainide   The patient denies chest pain, shortness of breath, nocturnal dyspnea, orthopnea or peripheral edema.  There have been no palpitations, lightheadedness or syncope.   But her daugther notes that she has moderate DOE and improved modestly when she received a diuretic trial by Dr. Donley Redder following the mildly elevated BNP.  Has not had edema.  Reviewing her imaging studies, she had a chest x-ray with right lower lung scarring in 2023 which was also noted on the CT scan from 2020, the latter also referred to emphysema in this lady with longstanding smoking  DATE PR interval QRSduration Dose  7/19  148 78 0  8/19 168 82 50 F  12/19 200 90 75  3/22 240 104 75  3/3 224 92 50  9/23 200 (low amplitude but I think it is right) 86 50  4/24 208 88 50     Date Cr K Hgb TSH  2/23 0.96 3.5 12.2 2.178   2/24 1.15 3.5 13.1 4.5     Records and Results Reviewed   Past Medical History:  Diagnosis Date   Anemia    treated ~2009 with iron, resolved   Arthritis    Cataract    surgery on R catarct 2012   Chronic  systolic CHF (congestive heart failure)    a. TTE 2012: EF of 45-50%, mild diffuse HK, trivial AI, mild MR, mildly dilated RV with mild reduction of RVSF, mild to moderate TR, mild to moderate PR, mildly elevated PASP   COPD (chronic obstructive pulmonary disease)    Family history of adverse reaction to anesthesia    Mother - altered mental status (long term)   Glaucoma    History of pelvic fracture    Hyperglycemia    mild inc in fasting sugar   Hyperlipidemia    Hypothyroidism    Insomnia    Migraines    without aura, sx since age 73   Osteopenia    prev with 10 years of fosamax and intolerant of evista   PAF (paroxysmal atrial fibrillation)    a. initially noted 2012; b. CHADS2VASc at least 5 (CHF, age x 2, vasacular disease, female)   Pulmonary hypertension    Wears dentures    full upper and lower    Past Surgical History:  Procedure Laterality Date   CATARACT EXTRACTION     CATARACT EXTRACTION W/PHACO Left 08/27/2016   Procedure: CATARACT EXTRACTION PHACO AND INTRAOCULAR LENS PLACEMENT ([place])  Left;  Surgeon: Leandrew Koyanagi, MD;  Location: [facility];  Service: Ophthalmology;  Laterality: Left;  COLONOSCOPY  2012   REFRACTIVE SURGERY      Current Meds  Medication Sig   Cholecalciferol (VITAMIN D) 50 MCG (2000 UT) CAPS Take 1 capsule (2,000 Units total) by mouth daily.   Cranberry 1000 MG CAPS Take by mouth daily.   donepezil (ARICEPT) 10 MG tablet Take 0.5 tablets (5 mg total) by mouth at bedtime.   flecainide (TAMBOCOR) 50 MG tablet Take 1 tablet (50 mg total) by mouth 2 (two) times daily.   furosemide (LASIX) 20 MG tablet TAKE 1 TABLET(20 MG) BY MOUTH DAILY AS NEEDED   latanoprost (XALATAN) 0.005 % ophthalmic solution Place 1 drop into both eyes at bedtime.   levothyroxine (SYNTHROID) 25 MCG tablet Take 1 tablet (25 mcg total) by mouth daily with breakfast.   traMADol (ULTRAM) 50 MG tablet Take 1 tablet (50 mg total) by mouth 3 (three) times daily as  needed.    Allergies  Allergen Reactions   Codeine     Doesn't remember reaction   Cortisone     Doesn't remember reaction   Decongestant [Pseudoephedrine Hcl] Other (See Comments)    Avoid decongestants due to glaucoma hx.     Naproxen     Stomach issues   Simvastatin     aches      Review of Systems negative except from HPI and PMH  Physical Exam BP 112/74   Pulse 70   Ht 5\' 4"  (1.626 m)   Wt 121 lb (54.9 kg)   SpO2 94%   BMI 20.77 kg/m  Well developed and nourished in no acute distress HENT normal Neck supple with JVP 10 cm+ Diffuse crackles right and left top to bottom Regular rate and rhythm, no murmurs or gallops Abd-soft with active BS No Clubbing cyanosis edema Skin-warm and dry A & Oriented x1   Grossly normal sensory and motor function  ECG sinus at 70 Interval 21/09/47 Occasional PAC    Assessment and  Plan  Atrial tach  Sinus brady  Dizziness-chronic resolved  Low blood pressure  Balance concerns  QRS widening on flecainide dose limited to 50 mg twice daily  Coronary Calcification  Pulmonary scarring by chest x-ray  Hyperlipidemia  Thankfully her daughter (same last name) was here noting that she has had increasing shortness of breath that there is some modest improvement with the diuretics.  BNP was borderline elevated, we will repeat the effort to diurese as she has diffuse lung noises and an elevated JVP.  I suspect that this represents primarily however lung disease with her prior history of scarring.  We will reassess by chest CT with the idea that if something significant is noted she would see pulmonary and then discussions could be had as to how aggressive to be in treating such.  Has had some lightheadedness.  Did not seem to worsen with the addition of the diuretic.  Has had potassiums that have gone from low to normal back to borderline low, we will check a metabolic profile in 2 weeks  With her coronary artery calcification,  it would be reasonable to consider statin therapy but I think the incremental improvement in outcome in this 88 year old lady is hard to anticipate being significant.  She does take aspirin we will try and have her reduce it from 325--81 (takes it most days of the week for discomfort)

## 2022-07-26 NOTE — Patient Instructions (Signed)
Medication Instructions:  Your physician has recommended you make the following change in your medication:   ** Increase Furosemide 20mg  to 2 tablets (40mg ) x 5 days then return to normal dosing  *If you need a refill on your cardiac medications before your next appointment, please call your pharmacy*   Lab Work: BMET 2 weeks If you have labs (blood work) drawn today and your tests are completely normal, you will receive your results only by: [place] (if you have MyChart) OR A paper copy in the mail If you have any lab test that is abnormal or we need to change your treatment, we will call you to review the results.   Testing/Procedures:  Please call [phone] to schedule this test. Non-Cardiac CT scanning, (CAT scanning), is a noninvasive, special x-ray that produces cross-sectional images of the body using x-rays and a computer. CT scans help physicians diagnose and treat medical conditions. For some CT exams, a contrast material is used to enhance visibility in the area of the body being studied. CT scans provide greater clarity and reveal more details than regular x-ray exams.    Follow-Up: At [facility], you and your health needs are our priority.  As part of our continuing mission to provide you with exceptional heart care, we have created designated Provider Care Teams.  These Care Teams include your primary Cardiologist (physician) and Advanced Practice Providers (APPs -  Physician Assistants and Nurse Practitioners) who all work together to provide you with the care you need, when you need it.  We recommend signing up for the patient portal called "MyChart".  Sign up information is provided on this After Visit Summary.  MyChart is used to connect with patients for Virtual Visits (Telemedicine).  Patients are able to view lab/test results, encounter notes, upcoming appointments, etc.  Non-urgent messages can be sent to your provider as well.   To learn more about  what you can do with MyChart, go to [email or web address].    Your next appointment:   6 months with Dr Caryl Comes

## 2022-07-27 ENCOUNTER — Encounter: Payer: Self-pay | Admitting: Internal Medicine

## 2022-07-30 ENCOUNTER — Other Ambulatory Visit: Payer: Self-pay | Admitting: *Deleted

## 2022-07-30 DIAGNOSIS — R0602 Shortness of breath: Secondary | ICD-10-CM

## 2022-07-30 NOTE — Telephone Encounter (Signed)
That would be good.  Thanks.

## 2022-08-02 ENCOUNTER — Ambulatory Visit
Admission: RE | Admit: 2022-08-02 | Discharge: 2022-08-02 | Disposition: A | Discharge status: Home / Self Care | Payer: PPO | Source: Ambulatory Visit | Attending: Internal Medicine | Admitting: Internal Medicine

## 2022-08-02 DIAGNOSIS — J9601 Acute respiratory failure with hypoxia: Secondary | ICD-10-CM | POA: Diagnosis not present

## 2022-08-02 DIAGNOSIS — I2510 Atherosclerotic heart disease of native coronary artery without angina pectoris: Secondary | ICD-10-CM | POA: Diagnosis not present

## 2022-08-02 DIAGNOSIS — N179 Acute kidney failure, unspecified: Secondary | ICD-10-CM | POA: Diagnosis not present

## 2022-08-02 DIAGNOSIS — E039 Hypothyroidism, unspecified: Secondary | ICD-10-CM | POA: Diagnosis not present

## 2022-08-02 DIAGNOSIS — J9621 Acute and chronic respiratory failure with hypoxia: Secondary | ICD-10-CM | POA: Diagnosis not present

## 2022-08-02 DIAGNOSIS — J8410 Pulmonary fibrosis, unspecified: Secondary | ICD-10-CM | POA: Insufficient documentation

## 2022-08-02 DIAGNOSIS — J189 Pneumonia, unspecified organism: Secondary | ICD-10-CM | POA: Diagnosis not present

## 2022-08-02 DIAGNOSIS — G319 Degenerative disease of nervous system, unspecified: Secondary | ICD-10-CM | POA: Diagnosis not present

## 2022-08-02 DIAGNOSIS — J440 Chronic obstructive pulmonary disease with acute lower respiratory infection: Secondary | ICD-10-CM | POA: Diagnosis not present

## 2022-08-02 DIAGNOSIS — K449 Diaphragmatic hernia without obstruction or gangrene: Secondary | ICD-10-CM | POA: Diagnosis not present

## 2022-08-02 DIAGNOSIS — Z823 Family history of stroke: Secondary | ICD-10-CM | POA: Diagnosis not present

## 2022-08-02 DIAGNOSIS — I5043 Acute on chronic combined systolic (congestive) and diastolic (congestive) heart failure: Secondary | ICD-10-CM | POA: Diagnosis not present

## 2022-08-02 DIAGNOSIS — F0390 Unspecified dementia without behavioral disturbance: Secondary | ICD-10-CM | POA: Diagnosis not present

## 2022-08-02 DIAGNOSIS — I272 Pulmonary hypertension, unspecified: Secondary | ICD-10-CM | POA: Diagnosis not present

## 2022-08-02 DIAGNOSIS — K219 Gastro-esophageal reflux disease without esophagitis: Secondary | ICD-10-CM | POA: Diagnosis not present

## 2022-08-02 DIAGNOSIS — N390 Urinary tract infection, site not specified: Secondary | ICD-10-CM | POA: Diagnosis not present

## 2022-08-02 DIAGNOSIS — Z833 Family history of diabetes mellitus: Secondary | ICD-10-CM | POA: Diagnosis not present

## 2022-08-02 DIAGNOSIS — Z79899 Other long term (current) drug therapy: Secondary | ICD-10-CM | POA: Diagnosis not present

## 2022-08-02 DIAGNOSIS — E785 Hyperlipidemia, unspecified: Secondary | ICD-10-CM | POA: Diagnosis not present

## 2022-08-02 DIAGNOSIS — I480 Paroxysmal atrial fibrillation: Secondary | ICD-10-CM | POA: Diagnosis not present

## 2022-08-02 DIAGNOSIS — R0602 Shortness of breath: Secondary | ICD-10-CM | POA: Diagnosis not present

## 2022-08-02 DIAGNOSIS — R55 Syncope and collapse: Secondary | ICD-10-CM | POA: Diagnosis not present

## 2022-08-02 DIAGNOSIS — J441 Chronic obstructive pulmonary disease with (acute) exacerbation: Secondary | ICD-10-CM | POA: Diagnosis not present

## 2022-08-02 DIAGNOSIS — I5022 Chronic systolic (congestive) heart failure: Secondary | ICD-10-CM | POA: Diagnosis not present

## 2022-08-02 DIAGNOSIS — J432 Centrilobular emphysema: Secondary | ICD-10-CM | POA: Diagnosis not present

## 2022-08-02 DIAGNOSIS — E876 Hypokalemia: Secondary | ICD-10-CM | POA: Diagnosis not present

## 2022-08-02 DIAGNOSIS — R9431 Abnormal electrocardiogram [ECG] [EKG]: Secondary | ICD-10-CM | POA: Diagnosis not present

## 2022-08-02 DIAGNOSIS — Z043 Encounter for examination and observation following other accident: Secondary | ICD-10-CM | POA: Diagnosis not present

## 2022-08-02 DIAGNOSIS — Z87891 Personal history of nicotine dependence: Secondary | ICD-10-CM | POA: Diagnosis not present

## 2022-08-02 DIAGNOSIS — Z1152 Encounter for screening for COVID-19: Secondary | ICD-10-CM | POA: Diagnosis not present

## 2022-08-02 DIAGNOSIS — R7303 Prediabetes: Secondary | ICD-10-CM | POA: Diagnosis not present

## 2022-08-02 DIAGNOSIS — G4700 Insomnia, unspecified: Secondary | ICD-10-CM | POA: Diagnosis not present

## 2022-08-02 DIAGNOSIS — F03918 Unspecified dementia, unspecified severity, with other behavioral disturbance: Secondary | ICD-10-CM | POA: Diagnosis not present

## 2022-08-02 DIAGNOSIS — Z66 Do not resuscitate: Secondary | ICD-10-CM | POA: Diagnosis not present

## 2022-08-03 ENCOUNTER — Emergency Department: Payer: PPO

## 2022-08-03 ENCOUNTER — Other Ambulatory Visit: Payer: Self-pay

## 2022-08-03 ENCOUNTER — Encounter: Payer: Self-pay | Admitting: Family Medicine

## 2022-08-03 ENCOUNTER — Inpatient Hospital Stay
Admission: EM | Admit: 2022-08-03 | Discharge: 2022-08-05 | DRG: 189 | Disposition: A | Discharge status: Home Health | Payer: PPO | Attending: Internal Medicine | Admitting: Internal Medicine

## 2022-08-03 ENCOUNTER — Encounter: Payer: Self-pay | Admitting: Intensive Care

## 2022-08-03 DIAGNOSIS — Z885 Allergy status to narcotic agent status: Secondary | ICD-10-CM

## 2022-08-03 DIAGNOSIS — R5381 Other malaise: Secondary | ICD-10-CM

## 2022-08-03 DIAGNOSIS — N179 Acute kidney failure, unspecified: Secondary | ICD-10-CM | POA: Diagnosis present

## 2022-08-03 DIAGNOSIS — Z888 Allergy status to other drugs, medicaments and biological substances status: Secondary | ICD-10-CM

## 2022-08-03 DIAGNOSIS — G4700 Insomnia, unspecified: Secondary | ICD-10-CM | POA: Diagnosis present

## 2022-08-03 DIAGNOSIS — J189 Pneumonia, unspecified organism: Secondary | ICD-10-CM | POA: Diagnosis present

## 2022-08-03 DIAGNOSIS — J432 Centrilobular emphysema: Secondary | ICD-10-CM | POA: Diagnosis present

## 2022-08-03 DIAGNOSIS — F0390 Unspecified dementia without behavioral disturbance: Secondary | ICD-10-CM | POA: Insufficient documentation

## 2022-08-03 DIAGNOSIS — J9621 Acute and chronic respiratory failure with hypoxia: Secondary | ICD-10-CM | POA: Diagnosis present

## 2022-08-03 DIAGNOSIS — E039 Hypothyroidism, unspecified: Secondary | ICD-10-CM | POA: Diagnosis present

## 2022-08-03 DIAGNOSIS — Z7951 Long term (current) use of inhaled steroids: Secondary | ICD-10-CM

## 2022-08-03 DIAGNOSIS — I5043 Acute on chronic combined systolic (congestive) and diastolic (congestive) heart failure: Secondary | ICD-10-CM | POA: Diagnosis present

## 2022-08-03 DIAGNOSIS — I4891 Unspecified atrial fibrillation: Secondary | ICD-10-CM | POA: Diagnosis present

## 2022-08-03 DIAGNOSIS — I480 Paroxysmal atrial fibrillation: Secondary | ICD-10-CM | POA: Diagnosis present

## 2022-08-03 DIAGNOSIS — Z79899 Other long term (current) drug therapy: Secondary | ICD-10-CM | POA: Diagnosis not present

## 2022-08-03 DIAGNOSIS — E876 Hypokalemia: Secondary | ICD-10-CM | POA: Diagnosis present

## 2022-08-03 DIAGNOSIS — Z1152 Encounter for screening for COVID-19: Secondary | ICD-10-CM

## 2022-08-03 DIAGNOSIS — E785 Hyperlipidemia, unspecified: Secondary | ICD-10-CM | POA: Diagnosis present

## 2022-08-03 DIAGNOSIS — R7303 Prediabetes: Secondary | ICD-10-CM | POA: Diagnosis present

## 2022-08-03 DIAGNOSIS — J440 Chronic obstructive pulmonary disease with acute lower respiratory infection: Secondary | ICD-10-CM | POA: Diagnosis present

## 2022-08-03 DIAGNOSIS — R9431 Abnormal electrocardiogram [ECG] [EKG]: Secondary | ICD-10-CM

## 2022-08-03 DIAGNOSIS — R55 Syncope and collapse: Secondary | ICD-10-CM | POA: Diagnosis not present

## 2022-08-03 DIAGNOSIS — I2510 Atherosclerotic heart disease of native coronary artery without angina pectoris: Secondary | ICD-10-CM | POA: Diagnosis present

## 2022-08-03 DIAGNOSIS — K219 Gastro-esophageal reflux disease without esophagitis: Secondary | ICD-10-CM | POA: Diagnosis present

## 2022-08-03 DIAGNOSIS — J441 Chronic obstructive pulmonary disease with (acute) exacerbation: Secondary | ICD-10-CM | POA: Diagnosis present

## 2022-08-03 DIAGNOSIS — Z823 Family history of stroke: Secondary | ICD-10-CM

## 2022-08-03 DIAGNOSIS — I471 Supraventricular tachycardia, unspecified: Secondary | ICD-10-CM | POA: Diagnosis present

## 2022-08-03 DIAGNOSIS — I272 Pulmonary hypertension, unspecified: Secondary | ICD-10-CM | POA: Diagnosis present

## 2022-08-03 DIAGNOSIS — Z961 Presence of intraocular lens: Secondary | ICD-10-CM | POA: Diagnosis present

## 2022-08-03 DIAGNOSIS — J9601 Acute respiratory failure with hypoxia: Secondary | ICD-10-CM | POA: Diagnosis present

## 2022-08-03 DIAGNOSIS — Z66 Do not resuscitate: Secondary | ICD-10-CM | POA: Diagnosis present

## 2022-08-03 DIAGNOSIS — Z9842 Cataract extraction status, left eye: Secondary | ICD-10-CM

## 2022-08-03 DIAGNOSIS — Z833 Family history of diabetes mellitus: Secondary | ICD-10-CM

## 2022-08-03 DIAGNOSIS — N390 Urinary tract infection, site not specified: Secondary | ICD-10-CM | POA: Diagnosis present

## 2022-08-03 DIAGNOSIS — Z87891 Personal history of nicotine dependence: Secondary | ICD-10-CM

## 2022-08-03 DIAGNOSIS — I5022 Chronic systolic (congestive) heart failure: Secondary | ICD-10-CM | POA: Diagnosis not present

## 2022-08-03 DIAGNOSIS — F03918 Unspecified dementia, unspecified severity, with other behavioral disturbance: Secondary | ICD-10-CM | POA: Diagnosis present

## 2022-08-03 DIAGNOSIS — J449 Chronic obstructive pulmonary disease, unspecified: Secondary | ICD-10-CM

## 2022-08-03 DIAGNOSIS — I5042 Chronic combined systolic (congestive) and diastolic (congestive) heart failure: Secondary | ICD-10-CM | POA: Insufficient documentation

## 2022-08-03 DIAGNOSIS — M85852 Other specified disorders of bone density and structure, left thigh: Secondary | ICD-10-CM | POA: Diagnosis present

## 2022-08-03 DIAGNOSIS — I5023 Acute on chronic systolic (congestive) heart failure: Secondary | ICD-10-CM | POA: Insufficient documentation

## 2022-08-03 LAB — BASIC METABOLIC PANEL
Anion gap: 17 — ABNORMAL HIGH (ref 5–15)
Anion gap: 14 (ref 5–15)
BUN: 23 mg/dL (ref 8–23)
BUN: 20 mg/dL (ref 8–23)
CO2: 33 mmol/L — ABNORMAL HIGH (ref 22–32)
CO2: 32 mmol/L (ref 22–32)
Calcium: 9.3 mg/dL (ref 8.9–10.3)
Calcium: 8.7 mg/dL — ABNORMAL LOW (ref 8.9–10.3)
Chloride: 85 mmol/L — ABNORMAL LOW (ref 98–111)
Chloride: 87 mmol/L — ABNORMAL LOW (ref 98–111)
Creatinine, Ser: 1.16 mg/dL — ABNORMAL HIGH (ref 0.44–1.00)
Creatinine, Ser: 0.91 mg/dL (ref 0.44–1.00)
GFR, Estimated: 46 mL/min — ABNORMAL LOW (ref >60)
GFR, Estimated: >60 mL/min (ref >60)
Glucose, Bld: 156 mg/dL — ABNORMAL HIGH (ref 70–99)
Glucose, Bld: 238 mg/dL — ABNORMAL HIGH (ref 70–99)
Potassium: 2.6 mmol/L — CL (ref 3.5–5.1)
Potassium: 2.5 mmol/L — CL (ref 3.5–5.1)
Sodium: 135 mmol/L (ref 135–145)
Sodium: 133 mmol/L — ABNORMAL LOW (ref 135–145)

## 2022-08-03 LAB — CBC
HCT: 43.8 % (ref 36.0–46.0)
Hemoglobin: 14.4 g/dL (ref 12.0–15.0)
MCH: 29.6 pg (ref 26.0–34.0)
MCHC: 32.9 g/dL (ref 30.0–36.0)
MCV: 90.1 fL (ref 80.0–100.0)
Platelets: 287 10*3/uL (ref 150–400)
RBC: 4.86 MIL/uL (ref 3.87–5.11)
RDW: 13.1 % (ref 11.5–15.5)
WBC: 11.9 10*3/uL — ABNORMAL HIGH (ref 4.0–10.5)
nRBC: 0 % (ref 0.0–0.2)

## 2022-08-03 LAB — SARS CORONAVIRUS 2 BY RT PCR: SARS Coronavirus 2 by RT PCR: NEGATIVE

## 2022-08-03 LAB — URINALYSIS, ROUTINE W REFLEX MICROSCOPIC
Bilirubin Urine: NEGATIVE
Glucose, UA: NEGATIVE mg/dL
Hgb urine dipstick: NEGATIVE
Ketones, ur: NEGATIVE mg/dL
Nitrite: NEGATIVE
Protein, ur: NEGATIVE mg/dL
Specific Gravity, Urine: 1.008 (ref 1.005–1.030)
WBC, UA: >50 WBC/hpf (ref 0–5)
pH: 6 (ref 5.0–8.0)

## 2022-08-03 LAB — BRAIN NATRIURETIC PEPTIDE: B Natriuretic Peptide: 82.2 pg/mL (ref 0.0–100.0)

## 2022-08-03 LAB — GLUCOSE, CAPILLARY: Glucose-Capillary: 147 mg/dL — ABNORMAL HIGH (ref 70–99)

## 2022-08-03 LAB — CBG MONITORING, ED: Glucose-Capillary: 256 mg/dL — ABNORMAL HIGH (ref 70–99)

## 2022-08-03 LAB — TROPONIN I (HIGH SENSITIVITY)
Troponin I (High Sensitivity): 15 ng/L (ref <18)
Troponin I (High Sensitivity): 14 ng/L (ref <18)

## 2022-08-03 LAB — MAGNESIUM: Magnesium: 2 mg/dL (ref 1.7–2.4)

## 2022-08-03 MED ORDER — INSULIN ASPART 100 UNIT/ML IJ SOLN
0.0000 [IU] | Freq: Three times a day (TID) | INTRAMUSCULAR | Status: DC
  Administered 2022-08-03: 8 [IU] via SUBCUTANEOUS
  Administered 2022-08-04: 3 [IU] via SUBCUTANEOUS
  Administered 2022-08-04: 2 [IU] via SUBCUTANEOUS
  Administered 2022-08-05: 3 [IU] via SUBCUTANEOUS
  Filled 2022-08-03 (×5): qty 1

## 2022-08-03 MED ORDER — POTASSIUM CHLORIDE 10 MEQ/100ML IV SOLN
10.0000 meq | INTRAVENOUS | Status: AC
  Administered 2022-08-03 (×2): 10 meq via INTRAVENOUS
  Filled 2022-08-03 (×2): qty 100

## 2022-08-03 MED ORDER — INSULIN ASPART 100 UNIT/ML IJ SOLN: 0.0000 [IU] | Freq: Every day | INTRAMUSCULAR | Status: DC

## 2022-08-03 MED ORDER — POTASSIUM CHLORIDE CRYS ER 20 MEQ PO TBCR
40.0000 meq | EXTENDED_RELEASE_TABLET | Freq: Once | ORAL | Status: AC
  Administered 2022-08-03: 40 meq via ORAL
  Filled 2022-08-03: qty 2

## 2022-08-03 MED ORDER — AZITHROMYCIN 500 MG PO TABS: 500.0000 mg | ORAL_TABLET | Freq: Every day | ORAL | Status: DC

## 2022-08-03 MED ORDER — ALBUTEROL SULFATE (2.5 MG/3ML) 0.083% IN NEBU
2.5000 mg | INHALATION_SOLUTION | Freq: Once | RESPIRATORY_TRACT | Status: AC
  Administered 2022-08-03: 2.5 mg via RESPIRATORY_TRACT
  Filled 2022-08-03: qty 3

## 2022-08-03 MED ORDER — PREDNISONE 20 MG PO TABS
40.0000 mg | ORAL_TABLET | Freq: Every day | ORAL | Status: DC
  Administered 2022-08-04 – 2022-08-05 (×2): 40 mg via ORAL
  Filled 2022-08-03 (×2): qty 2

## 2022-08-03 MED ORDER — LEVOTHYROXINE SODIUM 50 MCG PO TABS
25.0000 ug | ORAL_TABLET | Freq: Every day | ORAL | Status: DC
  Administered 2022-08-04 – 2022-08-05 (×2): 25 ug via ORAL
  Filled 2022-08-03 (×2): qty 1

## 2022-08-03 MED ORDER — POTASSIUM CHLORIDE 10 MEQ/100ML IV SOLN
10.0000 meq | Freq: Once | INTRAVENOUS | Status: AC
  Administered 2022-08-03: 10 meq via INTRAVENOUS
  Filled 2022-08-03: qty 100

## 2022-08-03 MED ORDER — FUROSEMIDE 40 MG PO TABS: 20.0000 mg | ORAL_TABLET | Freq: Every day | ORAL | Status: DC

## 2022-08-03 MED ORDER — ENOXAPARIN SODIUM 40 MG/0.4ML IJ SOSY
40.0000 mg | PREFILLED_SYRINGE | INTRAMUSCULAR | Status: DC
  Administered 2022-08-03 – 2022-08-04 (×2): 40 mg via SUBCUTANEOUS
  Filled 2022-08-03 (×2): qty 0.4

## 2022-08-03 MED ORDER — DONEPEZIL HCL 5 MG PO TABS
5.0000 mg | ORAL_TABLET | Freq: Every day | ORAL | Status: DC
  Administered 2022-08-03 – 2022-08-04 (×2): 5 mg via ORAL
  Filled 2022-08-03 (×2): qty 1

## 2022-08-03 MED ORDER — SODIUM CHLORIDE 0.9 % IV SOLN
1.0000 g | Freq: Every day | INTRAVENOUS | Status: DC
  Filled 2022-08-03: qty 10

## 2022-08-03 MED ORDER — TRAZODONE HCL 50 MG PO TABS
25.0000 mg | ORAL_TABLET | Freq: Once | ORAL | Status: AC
  Administered 2022-08-03: 25 mg via ORAL
  Filled 2022-08-03: qty 1

## 2022-08-03 MED ORDER — METHYLPREDNISOLONE SODIUM SUCC 125 MG IJ SOLR
125.0000 mg | Freq: Once | INTRAMUSCULAR | Status: AC
  Administered 2022-08-03: 125 mg via INTRAVENOUS
  Filled 2022-08-03: qty 2

## 2022-08-03 MED ORDER — INSULIN ASPART 100 UNIT/ML IJ SOLN
4.0000 [IU] | Freq: Three times a day (TID) | INTRAMUSCULAR | Status: DC
  Administered 2022-08-04 – 2022-08-05 (×5): 4 [IU] via SUBCUTANEOUS
  Filled 2022-08-03 (×6): qty 1

## 2022-08-03 MED ORDER — FLECAINIDE ACETATE 50 MG PO TABS
50.0000 mg | ORAL_TABLET | Freq: Two times a day (BID) | ORAL | Status: DC
  Administered 2022-08-03 – 2022-08-05 (×4): 50 mg via ORAL
  Filled 2022-08-03 (×5): qty 1

## 2022-08-03 MED ORDER — SODIUM CHLORIDE 0.9 % IV SOLN: 500.0000 mg | INTRAVENOUS | Status: DC

## 2022-08-03 MED ORDER — SODIUM CHLORIDE 0.9 % IV BOLUS
500.0000 mL | Freq: Once | INTRAVENOUS | Status: AC
  Administered 2022-08-03: 500 mL via INTRAVENOUS

## 2022-08-03 MED ORDER — POTASSIUM CHLORIDE 10 MEQ/100ML IV SOLN: 10.0000 meq | INTRAVENOUS | Status: DC

## 2022-08-03 MED ORDER — SODIUM CHLORIDE 0.9% FLUSH
3.0000 mL | Freq: Two times a day (BID) | INTRAVENOUS | Status: DC
  Administered 2022-08-03 – 2022-08-05 (×5): 3 mL via INTRAVENOUS

## 2022-08-03 MED ORDER — SODIUM CHLORIDE 0.9% FLUSH: 3.0000 mL | INTRAVENOUS | Status: DC | PRN

## 2022-08-03 MED ORDER — ONDANSETRON HCL 4 MG/2ML IJ SOLN: 4.0000 mg | Freq: Four times a day (QID) | INTRAMUSCULAR | Status: DC | PRN

## 2022-08-03 MED ORDER — TRAMADOL HCL 50 MG PO TABS
50.0000 mg | ORAL_TABLET | Freq: Three times a day (TID) | ORAL | Status: DC | PRN
  Administered 2022-08-03: 50 mg via ORAL
  Filled 2022-08-03: qty 1

## 2022-08-03 MED ORDER — SODIUM CHLORIDE 0.9 % IV SOLN: 250.0000 mL | INTRAVENOUS | Status: DC | PRN

## 2022-08-03 MED ORDER — ONDANSETRON HCL 4 MG PO TABS: 4.0000 mg | ORAL_TABLET | Freq: Four times a day (QID) | ORAL | Status: DC | PRN

## 2022-08-03 NOTE — Assessment & Plan Note (Addendum)
Cr 1.16 on presentation w/ GFR in the 40s  Also clinically dry w/ possible aspect of contraction alkalosis  Will give small NS bolus  Hold diuresis and offending meds for now  Follow volume status closely

## 2022-08-03 NOTE — Assessment & Plan Note (Addendum)
+   wheezing and new onset hypoxia on 2L [place]  Euvolemic- s/p diuresis in setting of CHF  CT chest 4/11: Mild centrilobular emphysema and diffuse bilateral bronchial wall thickening. IV solumedrol  Duonebs  IV azithromycin  Supplemental O2 prn

## 2022-08-03 NOTE — Assessment & Plan Note (Signed)
Syncopal/presyncopal episode x 2 assd w/ going from sitting to standing in setting of recent diuresis regimen for CHF flare  LLN BP on presentation  Suspect vasovagal events in setting of recent diuresis regimen  Orthostatics pending  2D ECHO  Gentle hydration as appropriate  Fall precautions  PT/OT eval

## 2022-08-03 NOTE — Telephone Encounter (Signed)
Called [name] and advised her to take patient to the ER. She agreed to take her this morning.

## 2022-08-03 NOTE — H&P (Addendum)
History and Physical    Patient: Vanessa [name] ZOX:[id number] DOB: 07-02-1934 DOA: 08/03/2022 DOS: the patient was seen and examined on 08/03/2022 PCP: Joaquim Nam, MD  Patient coming from: Home  Chief Complaint:  Chief Complaint  Patient presents with   Shortness of Breath   Weakness   HPI: Vanessa [name] is a 88 y.o. female with medical history significant of COPD, systolic CHF, dementia, hyperlipidemia, hypothyroidism, atrial fibrillation on flecainide, pulmonary hypertension presenting with acute respiratory failure with hypoxia, syncope, COPD exacerbation, hypokalemia, prolonged QT.  History primarily from patient's daughter in the setting of end-stage dementia.  Per report, patient with increased work of breathing over the past several days.  Was initially evaluated at cardiology clinic on April 8.  Some concern for CHF exacerbation.  Patient was given increased Lasix regimen.  Per the daughter, appropriate urine output with regimen.  Patient still with increased work of breathing as well as wheezing.  No fevers or chills.  Mild orthopnea.  No cough or sputum production.  Patient also with worsening weakness with syncopal and presyncopal episode over the past 24 hours.  Per report, patient usually ambulates with a walker.  Attempted to go from the bedroom to the bathroom with syncopal event and route.  Was helped down by her daughter with no reported head trauma.  Patient also had another episode going from the car to the door where patient became weak and began to go to the ground.  There was no loss consciousness or head trauma during this event.  No reported hemiparesis or slurred speech. Presented to the ER afebrile, heart rate in the 60s, blood pressure 110s over 70s, 88% on room air and transition to 2 L nasal cannula to keep O2 sats greater than 98%.  White count 11.9, hemoglobin 14, COVID flu and RSV negative, urinalysis grossly stable though some leukocytes, potassium 2.6,  creatinine 1.16, BNP 82.2.  CT head and chest x-ray grossly stable.  CT chest ordered yesterday with mild centrilobular emphysema and diffuse bilateral bronchial wall thickening. EKG w/ pulmonary disease pattern and prolongedQT at 595 msec. Review of Systems: As mentioned in the history of present illness. All other systems reviewed and are negative. Past Medical History:  Diagnosis Date   Anemia    treated ~2009 with iron, resolved   Arthritis    Cataract    surgery on R catarct 2012   Chronic systolic CHF (congestive heart failure)    a. TTE 2012: EF of 45-50%, mild diffuse HK, trivial AI, mild MR, mildly dilated RV with mild reduction of RVSF, mild to moderate TR, mild to moderate PR, mildly elevated PASP   COPD (chronic obstructive pulmonary disease)    Family history of adverse reaction to anesthesia    Mother - altered mental status (long term)   Glaucoma    History of pelvic fracture    Hyperglycemia    mild inc in fasting sugar   Hyperlipidemia    Hypothyroidism    Insomnia    Migraines    without aura, sx since age 68   Osteopenia    prev with 10 years of fosamax and intolerant of evista   PAF (paroxysmal atrial fibrillation)    a. initially noted 2012; b. CHADS2VASc at least 5 (CHF, age x 2, vasacular disease, female)   Pulmonary hypertension    Wears dentures    full upper and lower   Past Surgical History:  Procedure Laterality Date   CATARACT EXTRACTION  CATARACT EXTRACTION W/PHACO Left 08/27/2016   Procedure: CATARACT EXTRACTION PHACO AND INTRAOCULAR LENS PLACEMENT ([facility])  Left;  Surgeon: Lockie Mola, MD;  Location: [facility];  Service: Ophthalmology;  Laterality: Left;   COLONOSCOPY  2012   REFRACTIVE SURGERY     Social History:  reports that she has quit smoking. Her smoking use included cigarettes. She has a 12.50 pack-year smoking history. She has never used smokeless tobacco. She reports that she does not drink alcohol and does not use  drugs.  Allergies  Allergen Reactions   Codeine     Doesn't remember reaction   Cortisone     Doesn't remember reaction   Decongestant [Pseudoephedrine Hcl] Other (See Comments)    Avoid decongestants due to glaucoma hx.     Naproxen     Stomach issues   Simvastatin     aches    Family History  Problem Relation Age of Onset   Diabetes Father    Migraines Mother    Stroke Mother        TIAs   Breast cancer Neg Hx    Colon cancer Neg Hx     Prior to Admission medications   Medication Sig Start Date End Date Taking? Authorizing Provider  Apoaequorin (PREVAGEN EXTRA STRENGTH) 20 MG CAPS Take 20 mg by mouth in the morning.   Yes [provider]  Cholecalciferol (VITAMIN D) 50 MCG (2000 UT) CAPS Take 1 capsule (2,000 Units total) by mouth daily. 10/03/18  Yes Joaquim Nam, MD  Cranberry 1000 MG CAPS Take by mouth daily.   Yes [provider]  donepezil (ARICEPT) 10 MG tablet Take 0.5 tablets (5 mg total) by mouth at bedtime. 02/27/22  Yes Joaquim Nam, MD  flecainide (TAMBOCOR) 50 MG tablet Take 1 tablet (50 mg total) by mouth 2 (two) times daily. 07/26/22  Yes Duke Salvia, MD  furosemide (LASIX) 20 MG tablet TAKE 1 TABLET(20 MG) BY MOUTH DAILY AS NEEDED 06/27/22  Yes Joaquim Nam, MD  levothyroxine (SYNTHROID) 25 MCG tablet Take 1 tablet (25 mcg total) by mouth daily with breakfast. 05/08/22  Yes Joaquim Nam, MD  traMADol (ULTRAM) 50 MG tablet Take 1 tablet (50 mg total) by mouth 3 (three) times daily as needed. 05/08/22  Yes Joaquim Nam, MD  latanoprost (XALATAN) 0.005 % ophthalmic solution Place 1 drop into both eyes at bedtime. Patient not taking: Reported on 08/03/2022    [provider]    Physical Exam: Vitals:   08/03/22 1037 08/03/22 1042 08/03/22 1108 08/03/22 1435  BP:      Pulse:      Resp:      Temp:    98.1 F (36.7 C)  TempSrc:      SpO2: (!) 88%  98%   Weight:  54.9 kg    Height:  5\' 4"  (1.626 m)     Physical  Exam Constitutional:      Appearance: She is normal weight.  HENT:     Head: Normocephalic.     Mouth/Throat:     Mouth: Mucous membranes are dry.  Eyes:     Pupils: Pupils are equal, round, and reactive to light.  Cardiovascular:     Rate and Rhythm: Normal rate and regular rhythm.  Pulmonary:     Effort: Pulmonary effort is normal.  Abdominal:     General: Bowel sounds are normal.  Musculoskeletal:        General: Normal range of motion.  Cervical back: Normal range of motion.  Skin:    General: Skin is dry.  Neurological:     General: No focal deficit present.  Psychiatric:        Mood and Affect: Mood normal.     Data Reviewed:  There are no new results to review at this time. CT CHEST HIGH RESOLUTION CLINICAL DATA:  Pulmonary fibrosis, increasing shortness of breath  EXAM: CT CHEST WITHOUT CONTRAST  TECHNIQUE: Multidetector CT imaging of the chest was performed following the standard protocol without intravenous contrast. High resolution imaging of the lungs, as well as inspiratory and expiratory imaging, was performed.  RADIATION DOSE REDUCTION: This exam was performed according to the departmental dose-optimization program which includes automated exposure control, adjustment of the mA and/or kV according to patient size and/or use of iterative reconstruction technique.  COMPARISON:  08/26/2017  FINDINGS: Cardiovascular: Aortic atherosclerosis. Normal heart size. Three-vessel coronary artery calcifications. No pericardial effusion.  Mediastinum/Nodes: No enlarged mediastinal, hilar, or axillary lymph nodes. Large hiatal hernia with complete intrathoracic position of the stomach. Thyroid gland, trachea, and esophagus demonstrate no significant findings.  Lungs/Pleura: No evidence of fibrotic interstitial lung disease. No significant air trapping on expiratory phase imaging. Compressive atelectasis of the lung bases secondary to large hiatal  hernia. Mild centrilobular emphysema and diffuse bilateral bronchial wall thickening. No pleural effusion or pneumothorax.  Upper Abdomen: No acute abnormality.  Musculoskeletal: No chest wall abnormality. No acute osseous findings.  IMPRESSION: 1. No evidence of fibrotic interstitial lung disease. 2. Mild centrilobular emphysema and diffuse bilateral bronchial wall thickening. 3. Large hiatal hernia with complete intrathoracic position of the stomach. Compressive atelectasis of the lung bases secondary to hernia. 4. Coronary artery disease.  Aortic Atherosclerosis (ICD10-I70.0) and Emphysema (ICD10-J43.9).  Electronically Signed   By: Jearld Lesch M.D.   On: 08/03/2022 13:37 CT HEAD WO CONTRAST CLINICAL DATA:  Provided history: Syncope/presyncope, cerebrovascular cause suspected.  EXAM: CT HEAD WITHOUT CONTRAST  TECHNIQUE: Contiguous axial images were obtained from the base of the skull through the vertex without intravenous contrast.  RADIATION DOSE REDUCTION: This exam was performed according to the departmental dose-optimization program which includes automated exposure control, adjustment of the mA and/or kV according to patient size and/or use of iterative reconstruction technique.  COMPARISON:  Prior head CT examinations 05/29/2021 and earlier.  FINDINGS: Brain:  Generalized cerebral atrophy.  Partially empty sella turcica.  There is no acute intracranial hemorrhage.  No demarcated cortical infarct.  No extra-axial fluid collection.  No evidence of an intracranial mass.  No midline shift.  Vascular: No hyperdense vessel. Atherosclerotic calcifications.  Skull: No fracture or aggressive osseous lesion.  Sinuses/Orbits: No mass or acute finding within the imaged orbits. No significant paranasal sinus disease at the imaged levels.  IMPRESSION: 1.  No evidence of an acute intracranial abnormality. 2. Generalized cerebral  atrophy.  Electronically Signed   By: Jackey Loge D.O.   On: 08/03/2022 11:57 DG Chest Port 1 View CLINICAL DATA:  Shortness of breath  EXAM: PORTABLE CHEST 1 VIEW  COMPARISON:  Chest x-ray dated 05/29/2021 and chest CT dated 08/02/2022.  FINDINGS: Heart size and mediastinal contours are stable. Lungs are clear. No pleural effusion or pneumothorax is seen. Large hiatal hernia. Osseous structures about the chest are unremarkable.  IMPRESSION: 1. No active disease. No evidence of pneumonia or pulmonary edema. 2. Large hiatal hernia.  Electronically Signed   By: Bary Richard M.D.   On: 08/03/2022 11:44 DG HIP UNILAT WITH PELVIS  2-3 VIEWS LEFT CLINICAL DATA:  Fall  EXAM: DG HIP (WITH OR WITHOUT PELVIS) 3V LEFT  COMPARISON:  None Available.  FINDINGS: Osseous structures are osteopenic. Lumbosacral degenerative changes are noted. There is no acute fracture, dislocation or subluxation identified.  IMPRESSION: Osteopenia. Lumbosacral degenerative changes. No acute osseous abnormalities.  Electronically Signed   By: Layla Maw M.D.   On: 08/03/2022 11:09  Lab Results  Component Value Date   WBC 11.9 (H) 08/03/2022   HGB 14.4 08/03/2022   HCT 43.8 08/03/2022   MCV 90.1 08/03/2022   PLT 287 08/03/2022   Last metabolic panel Lab Results  Component Value Date   GLUCOSE 156 (H) 08/03/2022   NA 135 08/03/2022   K 2.6 (LL) 08/03/2022   CL 85 (L) 08/03/2022   CO2 33 (H) 08/03/2022   BUN 23 08/03/2022   CREATININE 1.16 (H) 08/03/2022   GFRNONAA 46 (L) 08/03/2022   CALCIUM 9.3 08/03/2022   PROT 6.4 05/08/2022   ALBUMIN 4.2 05/08/2022   BILITOT 0.4 05/08/2022   ALKPHOS 53 05/08/2022   AST 22 05/08/2022   ALT 11 05/08/2022   ANIONGAP 17 (H) 08/03/2022    Assessment and Plan: * Acute respiratory failure with hypoxia Worsening SOB and wheezing in setting of baseline CHF and COPD  BNP WNL, CXR grossly stable w/o cardiomegaly  Noted recent CHF flare  treatment by outpatient cardiology  New onset hypoxia- requiring 2L[place]  Suspect predominant COPD component + mild wheezing on exam  IV solumedrol, duonebs, azithromycin  Supplemental O2  Follow    Syncope Syncopal/presyncopal episode x 2 assd w/ going from sitting to standing in setting of recent diuresis regimen for CHF flare  LLN BP on presentation  Suspect vasovagal events in setting of recent diuresis regimen  CT head WNL Orthostatics pending  2D ECHO  Gentle hydration as appropriate  Fall precautions  PT/OT eval   COPD with acute exacerbation + wheezing and new onset hypoxia on 2L [place]  Euvolemic- s/p diuresis in setting of CHF  CT chest 4/11: Mild centrilobular emphysema and diffuse bilateral bronchial wall thickening. IV solumedrol  Duonebs  IV azithromycin  Supplemental O2 prn  Monitor blood sugars with steroids   Prolonged QT interval qTC 595 in setting of hypokalemia w/ K .6  Does not appear to be chronic issue in review of recent EKGs  Will replete and recheck  Ca 9.3  Mg level pending  Minimize offending medications  Consider cardiology consult if prolonged QT persists despite correction  Follow   Chronic combined systolic and diastolic CHF (congestive heart failure) 2D echo June 2019 with a EF of 40-45% and grade 2 diastolic dysfunction Status post increased diuresis regimen by outpatient cardiology Does appear to be euvolemic to borderline dry Will hold diuretics for now in setting of borderline AKI and hypokalemia with some element of possible contraction alkalosis Pending repeat 2D ECHO in setting of syncope  Reassess home Lasix use pending hydration and  volume status in the morning Follow closely  AKI (acute kidney injury) Cr 1.16 on presentation w/ GFR in the 40s  Also clinically dry w/ possible aspect of contraction alkalosis  Will give small NS bolus  Hold diuresis and offending meds for now  Follow volume status closely   Dementia End  stage  Cont aricept   Hypokalemia K 2.6 in setting of recent aggressive diuresis  Replete  Mag level  Follow    Hypothyroid Cont synthroid   Atrial fibrillation On flecainide  Advance Care Planning:   Code Status: Full Code   Consults: None   Family Communication: Daughter at the bedside   Severity of Illness: The appropriate patient status for this patient is INPATIENT. Inpatient status is judged to be reasonable and necessary in order to provide the required intensity of service to ensure the patient's safety. The patient's presenting symptoms, physical exam findings, and initial radiographic and laboratory data in the context of their chronic comorbidities is felt to place them at high risk for further clinical deterioration. Furthermore, it is not anticipated that the patient will be medically stable for discharge from the hospital within 2 midnights of admission.   * I certify that at the point of admission it is my clinical judgment that the patient will require inpatient hospital care spanning beyond 2 midnights from the point of admission due to high intensity of service, high risk for further deterioration and high frequency of surveillance required.*  Author: Floydene Flock, MD 08/03/2022 2:38 PM  For on call review [email or web address].

## 2022-08-03 NOTE — ED Triage Notes (Signed)
Patient presents with sob, weakness, and fatigue. Daughter reports rapid decline the past two days and patient unable ambulate without assistance or complete tasks without help.   Patient also had fall yesterday afternoon and c/o left hip pain now  Baseline ambulatory without assistance. Baseline does not wear oxygen.  Oxygen sats 88% RA and placed on 2L O2 with increase to 98%

## 2022-08-03 NOTE — ED Provider Notes (Signed)
[facility] Provider Note    Event Date/Time   First MD Initiated Contact with Patient 08/03/22 1123     (approximate)   History   Shortness of Breath and Weakness   HPI  Vanessa [name] is a 88 y.o. female with a history of COPD, CHF, hyperlipidemia, hypothyroidism, paroxysmal atrial fibrillation, and pulmonary hypertension who presents with increased shortness of breath, generalized weakness, and syncope yesterday.  Per the daughter, the patient was seen by her cardiologist last week who noted crackling on her lung exam.  Her furosemide was increased from 20 to 40 mg daily for 5 days, which ended several days ago.  However over the last few days the patient has been increasingly short of breath and weak.  She has had O2 saturations measured in the 80s at home.  She has not had any cough or fever.  In addition the patient has had very low energy and has been weak.  Yesterday she suddenly got weak while trying to go to the bathroom and passed out.  She also fell and hit her left hip and head.  I reviewed the past medical records.  The patient's most recent outpatient encounter was on 4/4 with Dr. Graciela Husbands from electrophysiology.  The patient reported increased dyspnea at that time.  He ordered a CT chest which was obtained yesterday but is not yet read.   Physical Exam   Triage Vital Signs: ED Triage Vitals  Enc Vitals Group     BP 08/03/22 1021 114/70     Pulse Rate 08/03/22 1021 61     Resp 08/03/22 1021 18     Temp 08/03/22 1021 98 F (36.7 C)     Temp Source 08/03/22 1021 Oral     SpO2 08/03/22 1021 98 %     Weight 08/03/22 1042 121 lb (54.9 kg)     Height 08/03/22 1042  (1.626 m)     Head Circumference --      Peak Flow --      Pain Score 08/03/22 1040 7     Pain Loc --      Pain Edu? --      Excl. in GC? --     Most recent vital signs: Vitals:   08/03/22 1108 08/03/22 1435  BP:    Pulse:    Resp:    Temp:  98.1 F (36.7 C)  SpO2:  98%      General: Alert, comfortable appearing, no distress. CV:  Good peripheral perfusion.  Resp:  Normal effort.  Rales to bases bilaterally. Abd:  Soft and nontender.  No distention.  Other:  1+ bilateral lower extremity edema.  Dry mucous membranes.   ED Results / Procedures / Treatments   Labs (all labs ordered are listed, but only abnormal results are displayed) Labs Reviewed  BASIC METABOLIC PANEL - Abnormal; Notable for the following components:      Result Value   Potassium 2.6 (*)    Chloride 85 (*)    CO2 33 (*)    Glucose, Bld 156 (*)    Creatinine, Ser 1.16 (*)    GFR, Estimated 46 (*)    Anion gap 17 (*)    All other components within normal limits  CBC - Abnormal; Notable for the following components:   WBC 11.9 (*)    All other components within normal limits  URINALYSIS, ROUTINE W REFLEX MICROSCOPIC - Abnormal; Notable for the following components:   Color, Urine  YELLOW (*)    APPearance HAZY (*)    Leukocytes,Ua LARGE (*)    Bacteria, UA RARE (*)    All other components within normal limits  SARS CORONAVIRUS 2 BY RT PCR  BRAIN NATRIURETIC PEPTIDE  BASIC METABOLIC PANEL  HEMOGLOBIN A1C  MAGNESIUM  TROPONIN I (HIGH SENSITIVITY)  TROPONIN I (HIGH SENSITIVITY)     EKG  ED ECG REPORT I, Dionne Bucy, the attending physician, personally viewed and interpreted this ECG.  Date: 08/03/2022 EKG Time: 1030 Rate: 55 Rhythm: normal sinus rhythm QRS Axis: normal Intervals: Prolonged QTc ST/T Wave abnormalities: Nonspecific ST abnormalities laterally Narrative Interpretation: no evidence of acute ischemia; no significant change when compared to EKG of 12/28/2021    RADIOLOGY    XR L hip: I independently viewed and interpreted the images; there is no acute fracture  Chest x-ray: There is no focal consolidation or edema  CT head: No acute abnormality  PROCEDURES:  Critical Care performed: No  Procedures   MEDICATIONS ORDERED IN  ED: Medications  albuterol (PROVENTIL) (2.5 MG/3ML) 0.083% nebulizer solution 2.5 mg (has no administration in time range)  albuterol (PROVENTIL) (2.5 MG/3ML) 0.083% nebulizer solution 2.5 mg (has no administration in time range)  methylPREDNISolone sodium succinate (SOLU-MEDROL) 125 mg/2 mL injection 125 mg (has no administration in time range)  flecainide (TAMBOCOR) tablet 50 mg (has no administration in time range)  donepezil (ARICEPT) tablet 5 mg (has no administration in time range)  levothyroxine (SYNTHROID) tablet 25 mcg (has no administration in time range)  traMADol (ULTRAM) tablet 50 mg (has no administration in time range)  enoxaparin (LOVENOX) injection 40 mg (has no administration in time range)  sodium chloride flush (NS) 0.9 % injection 3 mL (has no administration in time range)  sodium chloride flush (NS) 0.9 % injection 3 mL (has no administration in time range)  0.9 %  sodium chloride infusion (has no administration in time range)  ondansetron (ZOFRAN) tablet 4 mg (has no administration in time range)    Or  ondansetron (ZOFRAN) injection 4 mg (has no administration in time range)  azithromycin (ZITHROMAX) 500 mg in sodium chloride 0.9 % 250 mL IVPB (has no administration in time range)    Followed by  azithromycin (ZITHROMAX) tablet 500 mg (has no administration in time range)  predniSONE (DELTASONE) tablet 40 mg (has no administration in time range)  potassium chloride 10 mEq in 100 mL IVPB (has no administration in time range)  sodium chloride 0.9 % bolus 500 mL (has no administration in time range)  insulin aspart (novoLOG) injection 0-15 Units (has no administration in time range)  insulin aspart (novoLOG) injection 0-5 Units (has no administration in time range)  insulin aspart (novoLOG) injection 4 Units (has no administration in time range)  potassium chloride 10 mEq in 100 mL IVPB (0 mEq Intravenous Stopped 08/03/22 1447)  potassium chloride 10 mEq in 100 mL IVPB  (0 mEq Intravenous Stopped 08/03/22 1323)     IMPRESSION / MDM / ASSESSMENT AND PLAN / ED COURSE  I reviewed the triage vital signs and the nursing notes.  88 year old female with PMH as noted above presents with increased shortness of breath, hypoxia, generalized weakness, and an episode of syncope and fall yesterday.  Exam reveals Rales to bilateral bases.  O2 saturation was in the high 80s on room air.  She is now on O2 by nasal cannula.  Differential diagnosis includes, but is not limited to, COPD exacerbation, new onset CHF, other cardiac etiology,  AKI, electrolyte abnormality, other metabolic disturbance, pneumonia, COVID, or other infectious etiology.  I do not suspect PE given lack of chest pain or tachycardia.  We will obtain chest x-ray, lab workup, and reassess.  Patient's presentation is most consistent with acute presentation with potential threat to life or bodily function.  The patient is on the cardiac monitor to evaluate for evidence of arrhythmia and/or significant heart rate changes.  ----------------------------------------- 1:35 PM on 08/03/2022 -----------------------------------------  Labs reveal hypokalemia, but no other acute abnormalities.  COVID swab is negative.  BNP and troponin are normal.  There is minimal leukocytosis.  Chest x-ray does not show any evidence of edema or consolidation.  The patient had a CT chest obtained as an outpatient yesterday.  I called transfer radiology to try to obtain a more prompt read on this CT.  Given her hypoxia and the hypokalemia, the patient will need admission for further workup and management.  I consulted Dr. Alvester Morin; based our discussion he agreed to evaluate the patient for admission.  FINAL CLINICAL IMPRESSION(S) / ED DIAGNOSES   Final diagnoses:  Acute on chronic respiratory failure with hypoxia  Syncope, unspecified syncope type  Hypokalemia     Rx / DC Orders   ED Discharge Orders     None         Note:  This document was prepared using Dragon voice recognition software and may include unintentional dictation errors.    Dionne Bucy, MD 08/03/22 1511

## 2022-08-03 NOTE — Assessment & Plan Note (Signed)
Cont synthroid 

## 2022-08-03 NOTE — Assessment & Plan Note (Signed)
End stage  Cont aricept

## 2022-08-03 NOTE — Assessment & Plan Note (Signed)
+   wheezing and new onset hypoxia on 2L [place]  Euvolemic- s/p diuresis in setting of CHF  CT chest 4/11: Mild centrilobular emphysema and diffuse bilateral bronchial wall thickening. IV solumedrol  Duonebs  IV azithromycin  Supplemental O2 prn  Monitor blood sugars with steroids

## 2022-08-03 NOTE — Assessment & Plan Note (Addendum)
2D echo June 2019 with a EF of 40-45% and grade 2 diastolic dysfunction Status post increased diuresis regimen by outpatient cardiology Does appear to be euvolemic to borderline dry Will hold diuretics for now in setting of borderline AKI and hypokalemia with some element of possible contraction alkalosis Pending repeat 2D ECHO in setting of syncope  Reassess home Lasix use pending hydration and  volume status in the morning Follow closely

## 2022-08-03 NOTE — ED Notes (Signed)
Pt O2 at 88% on RA. 2L of O2 [place] applied. O2 stats now at 98%.

## 2022-08-03 NOTE — Telephone Encounter (Signed)
Please call and advise ER eval given the situation.  I don't have the CT report yet but given her symptoms, I would go to ER. Thanks.

## 2022-08-03 NOTE — Assessment & Plan Note (Signed)
On flecainide

## 2022-08-03 NOTE — Telephone Encounter (Signed)
Vanessa [name] called this morning about her mychart message and wanted to be sure Dr. Para March see's it and asked about the CT results. I let her know they are not finalized yet.

## 2022-08-03 NOTE — Assessment & Plan Note (Addendum)
qTC 595 in setting of hypokalemia w/ K @2 .6  Does not appear to be chronic issue in review of recent EKGs  Will replete and recheck  Ca 9.3  Mg level pending  Minimize offending medications  Consider cardiology consult if prolonged QT persists despite correction  Follow

## 2022-08-03 NOTE — Assessment & Plan Note (Signed)
Worsening SOB and wheezing in setting of baseline CHF and COPD  BNP WNL, CXR grossly stable w/o cardiomegaly  Noted recent CHF flare treatment by outpatient cardiology  New onset hypoxia- requiring 2LNC  Suspect predominant COPD component + mild wheezing on exam  IV solumedrol, duonebs, azithromycin  Supplemental O2  Follow

## 2022-08-03 NOTE — Assessment & Plan Note (Signed)
K 2.6 in setting of recent aggressive diuresis  Replete  Mag level  Follow

## 2022-08-03 NOTE — Assessment & Plan Note (Signed)
Syncopal/presyncopal episode x 2 assd w/ going from sitting to standing in setting of recent diuresis regimen for CHF flare  LLN BP on presentation  Suspect vasovagal events in setting of recent diuresis regimen  CT head WNL Orthostatics pending  2D ECHO  Gentle hydration as appropriate  Fall precautions  PT/OT eval

## 2022-08-03 NOTE — ED Notes (Signed)
Placed on 2L O2 and sats rose to 98%

## 2022-08-04 DIAGNOSIS — J9601 Acute respiratory failure with hypoxia: Secondary | ICD-10-CM | POA: Diagnosis not present

## 2022-08-04 LAB — COMPREHENSIVE METABOLIC PANEL
ALT: 11 U/L (ref 0–44)
AST: 19 U/L (ref 15–41)
Albumin: 3.3 g/dL — ABNORMAL LOW (ref 3.5–5.0)
Alkaline Phosphatase: 57 U/L (ref 38–126)
Anion gap: 12 (ref 5–15)
BUN: 20 mg/dL (ref 8–23)
CO2: 30 mmol/L (ref 22–32)
Calcium: 8.9 mg/dL (ref 8.9–10.3)
Chloride: 94 mmol/L — ABNORMAL LOW (ref 98–111)
Creatinine, Ser: 0.87 mg/dL (ref 0.44–1.00)
GFR, Estimated: >60 mL/min (ref >60)
Glucose, Bld: 148 mg/dL — ABNORMAL HIGH (ref 70–99)
Potassium: 3.9 mmol/L (ref 3.5–5.1)
Sodium: 136 mmol/L (ref 135–145)
Total Bilirubin: 1 mg/dL (ref 0.3–1.2)
Total Protein: 6 g/dL — ABNORMAL LOW (ref 6.5–8.1)

## 2022-08-04 LAB — BASIC METABOLIC PANEL
Anion gap: 14 (ref 5–15)
BUN: 22 mg/dL (ref 8–23)
CO2: 29 mmol/L (ref 22–32)
Calcium: 8.7 mg/dL — ABNORMAL LOW (ref 8.9–10.3)
Chloride: 91 mmol/L — ABNORMAL LOW (ref 98–111)
Creatinine, Ser: 0.95 mg/dL (ref 0.44–1.00)
GFR, Estimated: 59 mL/min — ABNORMAL LOW (ref >60)
Glucose, Bld: 155 mg/dL — ABNORMAL HIGH (ref 70–99)
Potassium: 3.5 mmol/L (ref 3.5–5.1)
Sodium: 134 mmol/L — ABNORMAL LOW (ref 135–145)

## 2022-08-04 LAB — GLUCOSE, CAPILLARY
Glucose-Capillary: 117 mg/dL — ABNORMAL HIGH (ref 70–99)
Glucose-Capillary: 187 mg/dL — ABNORMAL HIGH (ref 70–99)
Glucose-Capillary: 148 mg/dL — ABNORMAL HIGH (ref 70–99)
Glucose-Capillary: 133 mg/dL — ABNORMAL HIGH (ref 70–99)

## 2022-08-04 LAB — HEMOGLOBIN A1C
Hgb A1c MFr Bld: 6.5 % — ABNORMAL HIGH (ref 4.8–5.6)
Mean Plasma Glucose: 139.85 mg/dL

## 2022-08-04 LAB — CBC
HCT: 38.9 % (ref 36.0–46.0)
Hemoglobin: 13 g/dL (ref 12.0–15.0)
MCH: 29.9 pg (ref 26.0–34.0)
MCHC: 33.4 g/dL (ref 30.0–36.0)
MCV: 89.4 fL (ref 80.0–100.0)
Platelets: 246 10*3/uL (ref 150–400)
RBC: 4.35 MIL/uL (ref 3.87–5.11)
RDW: 13 % (ref 11.5–15.5)
WBC: 10.5 10*3/uL (ref 4.0–10.5)
nRBC: 0 % (ref 0.0–0.2)

## 2022-08-04 MED ORDER — DOXYCYCLINE HYCLATE 100 MG PO TABS
100.0000 mg | ORAL_TABLET | Freq: Two times a day (BID) | ORAL | Status: DC
  Administered 2022-08-04 – 2022-08-05 (×3): 100 mg via ORAL
  Filled 2022-08-04 (×3): qty 1

## 2022-08-04 MED ORDER — PANTOPRAZOLE SODIUM 40 MG PO TBEC
40.0000 mg | DELAYED_RELEASE_TABLET | Freq: Every day | ORAL | Status: DC
  Administered 2022-08-04 – 2022-08-05 (×2): 40 mg via ORAL
  Filled 2022-08-04 (×2): qty 1

## 2022-08-04 MED ORDER — FUROSEMIDE 20 MG PO TABS
20.0000 mg | ORAL_TABLET | Freq: Every day | ORAL | Status: DC
  Administered 2022-08-04 – 2022-08-05 (×2): 20 mg via ORAL
  Filled 2022-08-04 (×2): qty 1

## 2022-08-04 MED ORDER — ASPIRIN 81 MG PO CHEW
81.0000 mg | CHEWABLE_TABLET | Freq: Every day | ORAL | Status: DC
  Administered 2022-08-05: 81 mg via ORAL
  Filled 2022-08-04: qty 1

## 2022-08-04 MED ORDER — IPRATROPIUM-ALBUTEROL 0.5-2.5 (3) MG/3ML IN SOLN
3.0000 mL | Freq: Four times a day (QID) | RESPIRATORY_TRACT | Status: DC
  Administered 2022-08-04 (×2): 3 mL via RESPIRATORY_TRACT
  Filled 2022-08-04 (×2): qty 3

## 2022-08-04 MED ORDER — SODIUM CHLORIDE 0.9 % IV SOLN
1.0000 g | Freq: Every day | INTRAVENOUS | Status: DC
  Administered 2022-08-04 – 2022-08-05 (×2): 1 g via INTRAVENOUS
  Filled 2022-08-04 (×2): qty 10

## 2022-08-04 MED ORDER — TRAZODONE HCL 50 MG PO TABS
25.0000 mg | ORAL_TABLET | Freq: Every day | ORAL | Status: DC
  Administered 2022-08-04: 25 mg via ORAL
  Filled 2022-08-04: qty 1

## 2022-08-04 NOTE — Progress Notes (Addendum)
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  Assessment and Plan: Chronic Dementia with Diffuse Cerebral Atrophy: Brain imaging was reviewed with the family.  Her chronic dizziness and lightheadedness may be triggered by minor insults resulting in dizziness and passing out due to very poor cerebral reserve.  This hospitalization she had a minor fall likely due to acute infection and COPD exacerbation. - Continue Aricept.  Urinary Tract Infection: UA shows large leukocytes and >50 WBCs. - This is Day 1 of 3 of antibiotics.  Acute Hypoxic Respiratory Failure, resolved: - Perform walk test in am.  Acute COPD Exacerbation: - This is Day 2 out of 5 of steroids. - Continue Duo-Nebs q6hrs.  Community Acquired Pneumonia: Given cough and acute hypoxia, we will treat as mild pneumonia. - This is Day 1 out of 7 of IV Ceftriaxone and Doxycycline.  Acute on Chronic Diastolic and Systolic Heart Failure with EF 45-50%:  There are no pleural effusions on imaging.  She does have mild JVD. - Echo is pending. - Continue Lasix 20 mg daily.  Creatinine is stable.  Add on Kdur with Lasix on discharge.  Atrial Tachycardia: On 07/26/22, Cardiology documents that this is not Atrial Fibrillation.  Eliquis was discontinued. - Continue Flecainide 50 mg BID. - Monitor QTC. - Follow up with Cardiology outpatient.  Hypokalemia, resolved  Hypotension: - Monitor.  Coronary Artery Calcifications: - Aspirin.  Patient has intolerance to Statin.  Can consider PCSK9 if desires - discuss with Cardiology.  Hyperlipidemia: - Patient has intolerance to  Statin.  Can consider PCSK9 if desires - discuss with Cardiology.  Pre-Diabetes: - A1C is 6.5%.  We will manage conservatively through diet.  Hypothyroidism: - Synthroid.  GERD: - Protonix 40 mg daily.  Insomnia: - Trazodone 25 mg nightly.  Physical Deconditioning: - Referral was placed for outpatient physical therapy.  S/P Fall with Left Hip Pain / Osteopenia: - Xray shows no acute abnormality. - Patient worked well with physical therapy.     Subjective:  [name] is doing well today. Mental status is intact. She denies any chest pain or shortness of breath. She worked well with physical therapy.   Patient has a rolling walker at home. She will benefit from outpatient physical therapy a few times a week.  This was discussed with daughter who is in agreement.  Physical Exam: Vitals:   08/04/22 0817 08/04/22 1329 08/04/22 1601 08/04/22 1605  BP: 119/67  (!) 107/58   Pulse: 66  75   Resp:   16   Temp: 97.8 F (36.6 C)  98.3 F (36.8 C)   TempSrc: Oral  Oral   SpO2: 95% 97% 100% 92%  Weight:      Height:       Physical Exam Constitutional:      General: She is not in acute distress. HENT:     Head: Normocephalic and atraumatic.  Eyes:     Extraocular Movements: Extraocular movements intact.     Pupils: Pupils are equal, round, and reactive to light.  Neck:     Vascular: JVD present.  Cardiovascular:  Rate and Rhythm: Normal rate. Rhythm irregular.     Heart sounds: Murmur heard.  Pulmonary:     Effort: Pulmonary effort is normal. No respiratory distress.     Breath sounds: Normal breath sounds.  Chest:     Chest wall: No tenderness.  Skin:    General: Skin is warm and dry.     Capillary Refill: Capillary refill takes less than 2 seconds.  Neurological:     General: No focal deficit present.     Comments: Mild intention tremor  Psychiatric:        Mood and Affect: Mood normal.     Data Reviewed: Lab Results  Component Value Date   WBC  10.5 08/04/2022   HGB 13.0 08/04/2022   HCT 38.9 08/04/2022   MCV 89.4 08/04/2022   PLT 246 08/04/2022   Last metabolic panel Lab Results  Component Value Date   GLUCOSE 148 (H) 08/04/2022   NA 136 08/04/2022   K 3.9 08/04/2022   CL 94 (L) 08/04/2022   CO2 30 08/04/2022   BUN 20 08/04/2022   CREATININE 0.87 08/04/2022   GFRNONAA >60 08/04/2022   CALCIUM 8.9 08/04/2022   PROT 6.0 (L) 08/04/2022   ALBUMIN 3.3 (L) 08/04/2022   BILITOT 1.0 08/04/2022   ALKPHOS 57 08/04/2022   AST 19 08/04/2022   ALT 11 08/04/2022   ANIONGAP 12 08/04/2022   CT CHEST HIGH RESOLUTION CLINICAL DATA:  Pulmonary fibrosis, increasing shortness of breath  EXAM: CT CHEST WITHOUT CONTRAST  TECHNIQUE: Multidetector CT imaging of the chest was performed following the standard protocol without intravenous contrast. High resolution imaging of the lungs, as well as inspiratory and expiratory imaging, was performed.  RADIATION DOSE REDUCTION: This exam was performed according to the departmental dose-optimization program which includes automated exposure control, adjustment of the mA and/or kV according to patient size and/or use of iterative reconstruction technique.  COMPARISON:  08/26/2017  FINDINGS: Cardiovascular: Aortic atherosclerosis. Normal heart size. Three-vessel coronary artery calcifications. No pericardial effusion.  Mediastinum/Nodes: No enlarged mediastinal, hilar, or axillary lymph nodes. Large hiatal hernia with complete intrathoracic position of the stomach. Thyroid gland, trachea, and esophagus demonstrate no significant findings.  Lungs/Pleura: No evidence of fibrotic interstitial lung disease. No significant air trapping on expiratory phase imaging. Compressive atelectasis of the lung bases secondary to large hiatal hernia. Mild centrilobular emphysema and diffuse bilateral bronchial wall thickening. No pleural effusion or pneumothorax.  Upper Abdomen: No acute  abnormality.  Musculoskeletal: No chest wall abnormality. No acute osseous findings.  IMPRESSION: 1. No evidence of fibrotic interstitial lung disease. 2. Mild centrilobular emphysema and diffuse bilateral bronchial wall thickening. 3. Large hiatal hernia with complete intrathoracic position of the stomach. Compressive atelectasis of the lung bases secondary to hernia. 4. Coronary artery disease.  Aortic Atherosclerosis (ICD10-I70.0) and Emphysema (ICD10-J43.9).  Electronically Signed   By: Jearld Lesch M.D.   On: 08/03/2022 13:37 CT HEAD WO CONTRAST CLINICAL DATA:  Provided history: Syncope/presyncope, cerebrovascular cause suspected.  EXAM: CT HEAD WITHOUT CONTRAST  TECHNIQUE: Contiguous axial images were obtained from the base of the skull through the vertex without intravenous contrast.  RADIATION DOSE REDUCTION: This exam was performed according to the departmental dose-optimization program which includes automated exposure control, adjustment of the mA and/or kV according to patient size and/or use of iterative reconstruction technique.  COMPARISON:  Prior head CT examinations 05/29/2021 and earlier.  FINDINGS: Brain:  Generalized cerebral atrophy.  Partially empty sella turcica.  There is no acute intracranial  hemorrhage.  No demarcated cortical infarct.  No extra-axial fluid collection.  No evidence of an intracranial mass.  No midline shift.  Vascular: No hyperdense vessel. Atherosclerotic calcifications.  Skull: No fracture or aggressive osseous lesion.  Sinuses/Orbits: No mass or acute finding within the imaged orbits. No significant paranasal sinus disease at the imaged levels.  IMPRESSION: 1.  No evidence of an acute intracranial abnormality. 2. Generalized cerebral atrophy.  Electronically Signed   By: Jackey Loge D.O.   On: 08/03/2022 11:57 DG Chest Port 1 View CLINICAL DATA:  Shortness of breath  EXAM: PORTABLE CHEST 1  VIEW  COMPARISON:  Chest x-ray dated 05/29/2021 and chest CT dated 08/02/2022.  FINDINGS: Heart size and mediastinal contours are stable. Lungs are clear. No pleural effusion or pneumothorax is seen. Large hiatal hernia. Osseous structures about the chest are unremarkable.  IMPRESSION: 1. No active disease. No evidence of pneumonia or pulmonary edema. 2. Large hiatal hernia.  Electronically Signed   By: Bary Richard M.D.   On: 08/03/2022 11:44 DG HIP UNILAT WITH PELVIS 2-3 VIEWS LEFT CLINICAL DATA:  Fall  EXAM: DG HIP (WITH OR WITHOUT PELVIS) 3V LEFT  COMPARISON:  None Available.  FINDINGS: Osseous structures are osteopenic. Lumbosacral degenerative changes are noted. There is no acute fracture, dislocation or subluxation identified.  IMPRESSION: Osteopenia. Lumbosacral degenerative changes. No acute osseous abnormalities.  Electronically Signed   By: Layla Maw M.D.   On: 08/03/2022 11:09    Family Communication: I spoke with the two daughters at bedside.  Patient has assistance and a rolling walker at home.  She will be fine to discharge home with continued outpatient physical therapy.  Disposition: Status is: Inpatient Remains inpatient appropriate because: requires oxygen and has not clinically improved  Planned Discharge Destination: Home    Time spent: 60 minutes  Author: Baldwin Jamaica, MD 08/04/2022 7:11 PM  For on call review [email or web address].

## 2022-08-04 NOTE — Evaluation (Signed)
Physical Therapy Evaluation Patient Details Name: Vanessa [name] MRN: [id number] DOB: 04-28-1934 Today's Date: 08/04/2022  History of Present Illness  Pt. is an 88 y.o. female who was admitted to [facility]  fololowing increased SOB with a syncopal episode. PMHx includes: COPD, SystolicCHF, End stage Dementia, Hyperlipidemia, Hypothyroidism, A-Fib, Pulmonary HTN, Acute Respiratory failur Hypoxia, Syncope, COPD exacerbation, Hypokalemia, Prolonged QT.   Clinical Impression  Pt received in Semi-Fowler's position and agreeable to therapy.  Pt pleasantly confused at times during the session, but is able to carry on a conversation throughout the session and is at times tangential.  Pt performed well with bed mobility and ambulation, demonstrating some weakness noted in the LE's at times, however able to ambulate around the nursing station prior to returning to the bed.  Pt left with all needs met and call bell within reach.       Recommendations for follow up therapy are one component of a multi-disciplinary discharge planning process, led by the attending physician.  Recommendations may be updated based on patient status, additional functional criteria and insurance authorization.  Follow Up Recommendations       Assistance Recommended at Discharge Frequent or constant Supervision/Assistance  Patient can return home with the following  A little help with walking and/or transfers;A little help with bathing/dressing/bathroom;Assist for transportation;Help with stairs or ramp for entrance    Equipment Recommendations None recommended by PT  Recommendations for Other Services       Functional Status Assessment Patient has had a recent decline in their functional status and demonstrates the ability to make significant improvements in function in a reasonable and predictable amount of time.     Precautions / Restrictions Precautions Precautions: None Restrictions Weight Bearing Restrictions: No       Mobility  Bed Mobility Overal bed mobility: Needs Assistance Bed Mobility: Supine to Sit, Sit to Supine     Supine to sit: Independent Sit to supine: Independent        Transfers Overall transfer level: Needs assistance Equipment used: Rolling walker (2 wheels) Transfers: Sit to/from Stand Sit to Stand: Min guard                Ambulation/Gait Ambulation/Gait assistance: Min guard Gait Distance (Feet): 160 Feet Assistive device: Rolling walker (2 wheels) Gait Pattern/deviations: Step-through pattern Gait velocity: decreased     General Gait Details: slow and steady gait pattern throughout.  Stairs            Wheelchair Mobility    Modified Rankin (Stroke Patients Only)       Balance Overall balance assessment: Needs assistance Sitting-balance support: Feet supported, No upper extremity supported Sitting balance-Leahy Scale: Good     Standing balance support: Bilateral upper extremity supported, During functional activity, Reliant on assistive device for balance Standing balance-Leahy Scale: Fair                               Pertinent Vitals/Pain Pain Assessment Pain Assessment: No/denies pain    Home Living Family/patient expects to be discharged to:: Private residence Living Arrangements: Children Available Help at Discharge: Family;Personal care attendant Type of Home: House Home Access: Stairs to enter   Secretary/administrator of Steps: 2   Home Layout: One level Home Equipment: Tub bench;Grab bars - tub/shower;Hand held shower head;BSC/3in1      Prior Function               Mobility Comments: Dtr.  reports Pt. wostly walked without a walker prior to admission. ADLs Comments: Resides at home alone, has supportive family, and caregivers daily for 3 hours a day to assist with showers, home management, medication management with set-up into a pillbox. Pt. does not drive.     Hand Dominance   Dominant Hand:  Right    Extremity/Trunk Assessment   Upper Extremity Assessment Upper Extremity Assessment: Generalized weakness    Lower Extremity Assessment Lower Extremity Assessment: Generalized weakness       Communication   Communication: No difficulties  Cognition Arousal/Alertness: Awake/alert Behavior During Therapy: WFL for tasks assessed/performed Overall Cognitive Status: History of cognitive impairments - at baseline                                 General Comments: Pt.'s son able to verify all functional history obtained by OT.        General Comments      Exercises     Assessment/Plan    PT Assessment Patient needs continued PT services  PT Problem List Decreased strength;Decreased activity tolerance;Decreased balance;Decreased mobility;Decreased cognition;Decreased knowledge of use of DME;Decreased safety awareness       PT Treatment Interventions DME instruction;Gait training;Stair training;Functional mobility training;Therapeutic activities;Therapeutic exercise;Balance training;Neuromuscular re-education    PT Goals (Current goals can be found in the Care Plan section)  Acute Rehab PT Goals Patient Stated Goal: to get back home. PT Goal Formulation: With patient/family Time For Goal Achievement: 08/18/22 Potential to Achieve Goals: Good    Frequency Min 2X/week     Co-evaluation               AM-PAC PT "6 Clicks" Mobility  Outcome Measure Help needed turning from your back to your side while in a flat bed without using bedrails?: A Little Help needed moving from lying on your back to sitting on the side of a flat bed without using bedrails?: A Little Help needed moving to and from a bed to a chair (including a wheelchair)?: A Little Help needed standing up from a chair using your arms (e.g., wheelchair or bedside chair)?: A Little Help needed to walk in hospital room?: A Little Help needed climbing 3-5 steps with a railing? : A Lot 6  Click Score: 17    End of Session Equipment Utilized During Treatment: Gait belt Activity Tolerance: Patient tolerated treatment well Patient left: in bed;with call bell/phone within reach;with family/visitor present Nurse Communication: Mobility status PT Visit Diagnosis: Unsteadiness on feet (R26.81);Other abnormalities of gait and mobility (R26.89);Muscle weakness (generalized) (M62.81);Difficulty in walking, not elsewhere classified (R26.2)    Time: 9381-8299 PT Time Calculation (min) (ACUTE ONLY): 24 min   Charges:   PT Evaluation $PT Eval Low Complexity: 1 Low PT Treatments $Therapeutic Activity: 8-22 mins        Nolon Bussing, PT, DPT Physical Therapist - [place]  [facility]  08/04/22, 4:16 PM

## 2022-08-04 NOTE — Evaluation (Addendum)
Occupational Therapy Evaluation Patient Details Name: Vanessa [name] MRN: [id number] DOB: [birth year] Today's Date: 08/04/2022   History of Present Illness Pt. is an 88 y.o. female who was admitted to [facility]  following increased SOB with a syncopal episode. PMHx includes: COPD, SystolicCHF, End stage Dementia, Hyperlipidemia, Hypothyroidism, A-Fib, Pulmonary HTN, Acute Respiratory failur Hypoxia, Syncope, COPD exacerbation, Hypokalemia, Prolonged QT.   Clinical Impression   Pt. presents with weakness, limited activity tolerance,  history of cognitive impairment, and limited functional mobility which limits her ability to complete basic ADL and IADL functioning. Pt. has supportive family. Pt. has a personal care aide daily for 3 hours a day, and required assist with showers, personal care, home management/meal prep, and  medication management set-up of a pillbox. Pt. Performed supine to sit to and from the EOB independently.  Pt. Required CGA sit to stand. Pt. requires MinA with ADLs with cognitive cues.  Pt.'s O2 was removed, and running beside her upon arrival.  On RA Pt. SpO2 92% HR 72 bpms, after moving to the side of the bed SpO2 88% on RA, SpO2 94% HR 88 bpms after standing with 2LO2 in place. Nursing was notified of Pt.'s SPO2. Pt. Will benefit from OT services for ADL training, and pt. education about energy conservation, work simplification techniques for ADLs/IADLs.  Recommend Post Acute care follow-up OT services per physician, and interdisciplinary team Recommendations.     Recommendations for follow up therapy are one component of a multi-disciplinary discharge planning process, led by the attending physician.  Recommendations may be updated based on patient status, additional functional criteria and insurance authorization.   Assistance Recommended at Discharge  Home Health OT  Patient can return home with the following A little help with bathing/dressing/bathroom;A little help with  walking and/or transfers;Assistance with cooking/housework;Direct supervision/assist for medications management    Functional Status Assessment  Patient has had a recent decline in their functional status and demonstrates the ability to make significant improvements in function in a reasonable and predictable amount of time.  Equipment Recommendations       Recommendations for Other Services       Precautions / Restrictions Precautions Precautions: None Restrictions Weight Bearing Restrictions: No      Mobility Bed Mobility Overal bed mobility: Needs Assistance Bed Mobility: Supine to Sit, Sit to Supine     Supine to sit: Independent Sit to supine: Independent        Transfers Overall transfer level: Needs assistance Equipment used: Rolling walker (2 wheels) Transfers: Sit to/from Stand Sit to Stand: Min guard                  Balance  Good unsupported sitting balance Fair standing balance                                         ADL either performed or assessed with clinical judgement   ADL Overall ADL's : Needs assistance/impaired Eating/Feeding: Independent;Set up   Grooming: Set up;Independent            Upper Body Dressing: Set up; Minimal assistance Lower Body Dressing: Set up;Minimal assistance                       Vision Baseline Vision/History: 1 Wears glasses Patient Visual Report: No change from baseline       Perception     Praxis  Pertinent Vitals/Pain Pain Assessment Pain Assessment: No/denies pain     Hand Dominance Right   Extremity/Trunk Assessment Upper Extremity Assessment Upper Extremity Assessment: Generalized weakness           Communication Communication Communication: No difficulties   Cognition Arousal/Alertness: Awake/alert Behavior During Therapy: WFL for tasks assessed/performed Overall Cognitive Status: History of cognitive impairments - at baseline                                  General Comments: Pt.'s daughter provided functional history interview     General Comments       Exercises     Shoulder Instructions      Home Living Family/patient expects to be discharged to:: Private residence Living Arrangements: Children Available Help at Discharge: Family;Personal care attendant Type of Home: House Home Access: Stairs to enter Secretary/administrator of Steps: 2   Home Layout: One level               Home Equipment: Tub bench;Grab bars - tub/shower;Hand held shower head;BSC/3in1          Prior Functioning/Environment               Mobility Comments: Dtr. reports Pt. wostly walked without a walker prior to admission. ADLs Comments: Resides at home alone, has supportive family, and caregivers daily for 3 hours a day to assist with showers, home management, medication management with set-up into a pillbox. Pt. does not drive.        OT Problem List: Decreased activity tolerance;Decreased safety awareness;Decreased knowledge of use of DME or AE      OT Treatment/Interventions: Self-care/ADL training;Therapeutic exercise;DME and/or AE instruction;Patient/family education;Neuromuscular education;Therapeutic activities    OT Goals(Current goals can be found in the care plan section) Acute Rehab OT Goals Patient Stated Goal: To return home OT Goal Formulation: With patient Time For Goal Achievement: 08/18/22 Potential to Achieve Goals: Good  OT Frequency: Min 2X/week    Co-evaluation              AM-PAC OT "6 Clicks" Daily Activity     Outcome Measure Help from another person eating meals?: None Help from another person taking care of personal grooming?: None Help from another person toileting, which includes using toliet, bedpan, or urinal?: A Little Help from another person bathing (including washing, rinsing, drying)?: A Little Help from another person to put on and taking off regular upper body  clothing?: A Little Help from another person to put on and taking off regular lower body clothing?: A Little 6 Click Score: 20   End of Session Equipment Utilized During Treatment: Gait belt  Activity Tolerance: Patient tolerated treatment well Patient left: in bed;with call bell/phone within reach;with bed alarm set  OT Visit Diagnosis: Muscle weakness (generalized) (M62.81)                Time: 1100-1137 OT Time Calculation (min): 37 min Charges:  OT General Charges $OT Visit: 1 Visit OT Evaluation $OT Eval Moderate Complexity: 1 Mod Olegario Messier, MS, OTR/L  Olegario Messier 08/04/2022, 11:58 AM

## 2022-08-05 DIAGNOSIS — J9601 Acute respiratory failure with hypoxia: Secondary | ICD-10-CM | POA: Diagnosis not present

## 2022-08-05 LAB — GLUCOSE, CAPILLARY
Glucose-Capillary: 95 mg/dL (ref 70–99)
Glucose-Capillary: 194 mg/dL — ABNORMAL HIGH (ref 70–99)

## 2022-08-05 LAB — BASIC METABOLIC PANEL
Anion gap: 9 (ref 5–15)
BUN: 25 mg/dL — ABNORMAL HIGH (ref 8–23)
CO2: 29 mmol/L (ref 22–32)
Calcium: 8.9 mg/dL (ref 8.9–10.3)
Chloride: 99 mmol/L (ref 98–111)
Creatinine, Ser: 0.99 mg/dL (ref 0.44–1.00)
GFR, Estimated: 56 mL/min — ABNORMAL LOW (ref >60)
Glucose, Bld: 121 mg/dL — ABNORMAL HIGH (ref 70–99)
Potassium: 3.4 mmol/L — ABNORMAL LOW (ref 3.5–5.1)
Sodium: 137 mmol/L (ref 135–145)

## 2022-08-05 LAB — MAGNESIUM: Magnesium: 2.1 mg/dL (ref 1.7–2.4)

## 2022-08-05 MED ORDER — IPRATROPIUM-ALBUTEROL 0.5-2.5 (3) MG/3ML IN SOLN: 3.0000 mL | Freq: Four times a day (QID) | RESPIRATORY_TRACT | Status: DC | PRN

## 2022-08-05 MED ORDER — TRAZODONE HCL 50 MG PO TABS: 25.0000 mg | ORAL_TABLET | Freq: Every day | ORAL | 0 refills | Status: DC

## 2022-08-05 MED ORDER — CEFDINIR 300 MG PO CAPS: 300.0000 mg | ORAL_CAPSULE | Freq: Two times a day (BID) | ORAL | 0 refills | Status: AC

## 2022-08-05 MED ORDER — POTASSIUM CHLORIDE CRYS ER 20 MEQ PO TBCR
40.0000 meq | EXTENDED_RELEASE_TABLET | Freq: Once | ORAL | Status: AC
  Administered 2022-08-05: 40 meq via ORAL
  Filled 2022-08-05: qty 2

## 2022-08-05 MED ORDER — PREDNISONE 20 MG PO TABS: 40.0000 mg | ORAL_TABLET | Freq: Every day | ORAL | 0 refills | Status: AC

## 2022-08-05 MED ORDER — FUROSEMIDE 20 MG PO TABS: 20.0000 mg | ORAL_TABLET | Freq: Every day | ORAL | 0 refills | Status: DC

## 2022-08-05 MED ORDER — ASPIRIN 81 MG PO CHEW: 81.0000 mg | CHEWABLE_TABLET | Freq: Every day | ORAL | 0 refills | Status: AC

## 2022-08-05 MED ORDER — DOXYCYCLINE HYCLATE 100 MG PO TABS: 100.0000 mg | ORAL_TABLET | Freq: Two times a day (BID) | ORAL | 0 refills | Status: AC

## 2022-08-05 NOTE — TOC Transition Note (Addendum)
Transition of Care [facility]) - CM/SW Discharge Note   Patient Details  Name: Vanessa [name] MRN: [id number] Date of Birth: [birth year]  Transition of Care [facility]) CM/SW Contact:  Bing Quarry, RN Phone Number: 08/05/2022, 11:13 AM   Clinical Narrative: 08/05/22: Discharge orders are in. PT/OT evaluations recommending [facility] PT/OT post discharge. Contacted provider and daughter regarding clarification. Daughter wished to have [facility] PT but does not need HH aide as the have private care services 3 hours/day. They have all needed DME in place already.   Updated provider requesting order as [facility] accepted per [facility]. Start of service will be 08/07/22, and they are to contact daughter, Vanessa [name]. Contact information given. Updated provider via secure chat. Pending updated orders. Gabriel Cirri RN CM    [name],Vanessa (Daughter)  [phone] (Home Phone)   Update:Orders received.  PT and OT ordered, Updated [facility]. DME home oxygen ordered after ambulation saturation test performed by Unit RN. Will need 2L/[place] contnuous and portable. Contacting [employer]. Gabriel Cirri RN CM   1245 pm. [employer] will deliver portable condenser to room for transport and will daughter, Vanessa [name], to arrange home set up Monday or Tuesday. Gabriel Cirri RN CM    Final next level of care: Home w Home Health Services Barriers to Discharge: Barriers Resolved   Patient Goals and CMS Choice   Choice offered to / list presented to : Adult Children  Discharge Placement                         Discharge Plan and Services Additional resources added to the After Visit Summary for                  DME Arranged: N/A DME Agency: NA       HH Arranged: PT HH Agency: [facility] Date HH Agency Contacted: 08/05/22 Time HH Agency Contacted: 1113 Representative spoke with at [facility] Agency: Adelina Mings and [employer]  Social Determinants of Health (SDOH) Interventions SDOH Screenings   Food Insecurity: No Food Insecurity (08/03/2022)   Housing: Low Risk  (08/03/2022)  Transportation Needs: No Transportation Needs (08/03/2022)  Utilities: Not At Risk (08/03/2022)  Alcohol Screen: Low Risk  (06/13/2022)  Depression (PHQ2-9): Low Risk  (06/13/2022)  Recent Concern: Depression (PHQ2-9) - High Risk (05/08/2022)  Financial Resource Strain: Low Risk  (06/13/2022)  Physical Activity: Inactive (06/13/2022)  Social Connections: Moderately Isolated (06/13/2022)  Stress: No Stress Concern Present (06/13/2022)  Tobacco Use: Medium Risk (08/03/2022)     Readmission Risk Interventions     No data to display

## 2022-08-05 NOTE — Progress Notes (Addendum)
Mobility Specialist - Progress Note    08/05/22 1300  Mobility  Activity Transferred to/from BSC;Dangled on edge of bed  Level of Assistance Minimal assist, patient does 75% or more  Assistive Device Front wheel walker  Distance Ambulated (ft) 5 ft  Range of Motion/Exercises Active  Activity Response Tolerated well  Mobility Referral Yes  $Mobility charge 1 Mobility    [employer] Mobility Specialist 08/05/22, 2:00 PM

## 2022-08-05 NOTE — Plan of Care (Signed)
  Problem: Respiratory: Goal: Ability to maintain a clear airway will improve Outcome: Adequate for Discharge   Problem: Respiratory: Goal: Levels of oxygenation will improve Outcome: Adequate for Discharge   Problem: Respiratory: Goal: Ability to maintain adequate ventilation will improve Outcome: Adequate for Discharge   Problem: Metabolic: Goal: Ability to maintain appropriate glucose levels will improve Outcome: Adequate for Discharge   Problem: Nutritional: Goal: Maintenance of adequate nutrition will improve Outcome: Adequate for Discharge Goal: Progress toward achieving an optimal weight will improve Outcome: Adequate for Discharge   Problem: Skin Integrity: Goal: Risk for impaired skin integrity will decrease Outcome: Adequate for Discharge

## 2022-08-05 NOTE — Discharge Summary (Signed)
Physician Discharge Summary   Patient: Vanessa [name] MRN: [id number] DOB: [birth year]  Admit date:     08/03/2022  Discharge date: 08/05/22  Discharge Physician: Baldwin Jamaica   PCP: Joaquim Nam, MD   Recommendations at discharge:   Please take antibiotics x 5 days to complete 7 day course to treat pneumonia. Please take Prednisone 40 mg daily x 3 days complete course of treatment for Acute COPD Exacerbation. Echo is pending on day of discharge.  Follow up on results with your cardiologist. Please follow up with your regular Cardiologist in one week to ensure resolution of symptoms. Home oxygen was provided PRN for patient to use at home.  Discharge Diagnoses: Principal Problem:   Acute respiratory failure with hypoxia Active Problems:   COPD with acute exacerbation   Syncope   Prolonged QT interval   Atrial fibrillation   Hypothyroid   Hypokalemia   Dementia   AKI (acute kidney injury)   Chronic combined systolic and diastolic CHF (congestive heart failure)  Resolved Problems:   * No resolved hospital problems. *  Brief hospital course: 88 year old female with PMH of Chronic Grade 2 Diastolic and Systolic Heart Failure with EF 45-50% and Chronic COPD who presented to the ED after experiencing dizziness and a fall.  She had acute dyspnea and wheezing and was  found to have a COPD exacerbation and mild acute CHF exacerbation.   Assessment and Plan: Chronic Dementia with Diffuse Cerebral Atrophy: Brain imaging was reviewed with the family.  Her chronic dizziness and lightheadedness may be triggered by minor insults resulting in dizziness and passing out due to very poor cerebral reserve.  Her recent episode was due to her acute infection and COPD exacerbation. - Continue Aricept.   Urinary Tract Infection: UA shows large leukocytes and >50 WBCs. - Patient received IV Ceftriaxone x 2 days and was discharged on Cefdinir to complete course of treatment.   Acute Hypoxic  Respiratory Failure, resolved: - Patient initially required 4L [place] and this weaned to room air with treatment. - On day of discharge, however, patient's oxygen saturation was <88% on her walk test. - Patient was discharged with home oxygen.   Acute COPD Exacerbation: - Patient was discharged on Prednisone 40 mg daily to complete 5 day course of treatment. - Continue Duo-Nebs q6hrs.   Community Acquired Pneumonia: Given cough and acute hypoxia, we will treat as mild pneumonia. - Patient received 2 days of IV Ceftriaxone and Doxycycline and clinically improved. - She was discharged on Cefdinir and Doxycycline x 5 days to complete 7 day course of treatment.   Acute on Chronic Diastolic and Systolic Heart Failure with EF 45-50%:  There are no pleural effusions on imaging.  She does have mild JVD. - Echo is pending.  Follow up on results outpatient. - Continue Lasix 20 mg daily.   Atrial Tachycardia: On 07/26/22, Cardiology documents that this is not Atrial Fibrillation.  Eliquis was discontinued. - Continue Flecainide 50 mg BID.  QTC is 524 ms.  Monitor outpatient.   Hypokalemia, resolved   Hypotension: - Monitor.   Coronary Artery Calcifications: - Aspirin.  Patient has intolerance to Statin.  Can consider PCSK9 if desires - discuss with Cardiology.   Hyperlipidemia: - Patient has intolerance to Statin.   Pre-Diabetes: - A1C is 6.5%.  We will manage conservatively through diet.   Hypothyroidism: - Synthroid.   GERD: - Protonix 40 mg daily.   Insomnia: - Trazodone 25 mg nightly.   Physical  Deconditioning: - Referral was placed for outpatient physical therapy.   S/P Fall with Left Hip Pain / Osteopenia: - Xray shows no acute abnormality. - Patient worked well with physical therapy.       Consultants: Cardiology Procedures performed: None Disposition: Home Diet recommendation:  Discharge Diet Orders (From admission, onward)     Start     Ordered   08/05/22 0000   Diet - low sodium heart healthy        08/05/22 0857   08/05/22 0000  Diet - low sodium heart healthy        08/05/22 1149           Cardiac diet DISCHARGE MEDICATION: Allergies as of 08/05/2022       Reactions   Codeine    Doesn't remember reaction   Cortisone    Doesn't remember reaction   Decongestant [pseudoephedrine Hcl] Other (See Comments)   Avoid decongestants due to glaucoma hx.     Naproxen    Stomach issues   Simvastatin    aches        Medication List     STOP taking these medications    traMADol 50 MG tablet Commonly known as: ULTRAM       TAKE these medications    aspirin 81 MG chewable tablet Chew 1 tablet (81 mg total) by mouth daily.   cefdinir 300 MG capsule Commonly known as: OMNICEF Take 1 capsule (300 mg total) by mouth 2 (two) times daily for 5 days.   Cranberry 1000 MG Caps Take by mouth daily.   donepezil 10 MG tablet Commonly known as: ARICEPT Take 0.5 tablets (5 mg total) by mouth at bedtime.   doxycycline 100 MG tablet Commonly known as: VIBRA-TABS Take 1 tablet (100 mg total) by mouth every 12 (twelve) hours for 5 days.   flecainide 50 MG tablet Commonly known as: TAMBOCOR Take 1 tablet (50 mg total) by mouth 2 (two) times daily.   furosemide 20 MG tablet Commonly known as: LASIX Take 1 tablet (20 mg total) by mouth daily. What changed: See the new instructions.   latanoprost 0.005 % ophthalmic solution Commonly known as: XALATAN Place 1 drop into both eyes at bedtime.   levothyroxine 25 MCG tablet Commonly known as: SYNTHROID Take 1 tablet (25 mcg total) by mouth daily with breakfast.   predniSONE 20 MG tablet Commonly known as: DELTASONE Take 2 tablets (40 mg total) by mouth daily with breakfast for 3 days.   Prevagen Extra Strength 20 MG Caps Generic drug: Apoaequorin Take 20 mg by mouth in the morning.   traZODone 50 MG tablet Commonly known as: DESYREL Take 0.5 tablets (25 mg total) by mouth at  bedtime.   Vitamin D 50 MCG (2000 UT) Caps Take 1 capsule (2,000 Units total) by mouth daily.               Durable Medical Equipment  (From admission, onward)           Start     Ordered   08/05/22 1150  DME Oxygen  Once       Question Answer Comment  Length of Need 12 Months   Mode or (Route) Nasal cannula   Liters per Minute 2   Frequency Continuous (stationary and portable oxygen unit needed)   Oxygen delivery system Gas      08/05/22 1149            Discharge Exam: Filed Weights   08/03/22 1042  Weight: 54.9 kg   Physical Exam Constitutional:      General: She is not in acute distress. HENT:     Head: Normocephalic and atraumatic.  Eyes:     Extraocular Movements: Extraocular movements intact.     Pupils: Pupils are equal, round, and reactive to light.  Cardiovascular:     Rate and Rhythm: Normal rate. Rhythm irregular.  Pulmonary:     Effort: Pulmonary effort is normal.     Breath sounds: Normal breath sounds.  Musculoskeletal:     Cervical back: Normal range of motion and neck supple.  Skin:    General: Skin is warm and dry.     Capillary Refill: Capillary refill takes less than 2 seconds.  Neurological:     General: No focal deficit present.  Psychiatric:        Mood and Affect: Mood normal.      Condition at discharge: stable  The results of significant diagnostics from this hospitalization (including imaging, microbiology, ancillary and laboratory) are listed below for reference.   Imaging Studies: CT CHEST HIGH RESOLUTION  Result Date: 08/03/2022 CLINICAL DATA:  Pulmonary fibrosis, increasing shortness of breath EXAM: CT CHEST WITHOUT CONTRAST TECHNIQUE: Multidetector CT imaging of the chest was performed following the standard protocol without intravenous contrast. High resolution imaging of the lungs, as well as inspiratory and expiratory imaging, was performed. RADIATION DOSE REDUCTION: This exam was performed according to the  departmental dose-optimization program which includes automated exposure control, adjustment of the mA and/or kV according to patient size and/or use of iterative reconstruction technique. COMPARISON:  08/26/2017 FINDINGS: Cardiovascular: Aortic atherosclerosis. Normal heart size. Three-vessel coronary artery calcifications. No pericardial effusion. Mediastinum/Nodes: No enlarged mediastinal, hilar, or axillary lymph nodes. Large hiatal hernia with complete intrathoracic position of the stomach. Thyroid gland, trachea, and esophagus demonstrate no significant findings. Lungs/Pleura: No evidence of fibrotic interstitial lung disease. No significant air trapping on expiratory phase imaging. Compressive atelectasis of the lung bases secondary to large hiatal hernia. Mild centrilobular emphysema and diffuse bilateral bronchial wall thickening. No pleural effusion or pneumothorax. Upper Abdomen: No acute abnormality. Musculoskeletal: No chest wall abnormality. No acute osseous findings. IMPRESSION: 1. No evidence of fibrotic interstitial lung disease. 2. Mild centrilobular emphysema and diffuse bilateral bronchial wall thickening. 3. Large hiatal hernia with complete intrathoracic position of the stomach. Compressive atelectasis of the lung bases secondary to hernia. 4. Coronary artery disease. Aortic Atherosclerosis (ICD10-I70.0) and Emphysema (ICD10-J43.9). Electronically Signed   By: Jearld Lesch M.D.   On: 08/03/2022 13:37   CT HEAD WO CONTRAST  Result Date: 08/03/2022 CLINICAL DATA:  Provided history: Syncope/presyncope, cerebrovascular cause suspected. EXAM: CT HEAD WITHOUT CONTRAST TECHNIQUE: Contiguous axial images were obtained from the base of the skull through the vertex without intravenous contrast. RADIATION DOSE REDUCTION: This exam was performed according to the departmental dose-optimization program which includes automated exposure control, adjustment of the mA and/or kV according to patient size  and/or use of iterative reconstruction technique. COMPARISON:  Prior head CT examinations 05/29/2021 and earlier. FINDINGS: Brain: Generalized cerebral atrophy. Partially empty sella turcica. There is no acute intracranial hemorrhage. No demarcated cortical infarct. No extra-axial fluid collection. No evidence of an intracranial mass. No midline shift. Vascular: No hyperdense vessel. Atherosclerotic calcifications. Skull: No fracture or aggressive osseous lesion. Sinuses/Orbits: No mass or acute finding within the imaged orbits. No significant paranasal sinus disease at the imaged levels. IMPRESSION: 1.  No evidence of an acute intracranial abnormality. 2. Generalized cerebral atrophy. Electronically Signed  By: Jackey Loge D.O.   On: 08/03/2022 11:57   DG Chest Port 1 View  Result Date: 08/03/2022 CLINICAL DATA:  Shortness of breath EXAM: PORTABLE CHEST 1 VIEW COMPARISON:  Chest x-ray dated 05/29/2021 and chest CT dated 08/02/2022. FINDINGS: Heart size and mediastinal contours are stable. Lungs are clear. No pleural effusion or pneumothorax is seen. Large hiatal hernia. Osseous structures about the chest are unremarkable. IMPRESSION: 1. No active disease. No evidence of pneumonia or pulmonary edema. 2. Large hiatal hernia. Electronically Signed   By: Bary Richard M.D.   On: 08/03/2022 11:44   DG HIP UNILAT WITH PELVIS 2-3 VIEWS LEFT  Result Date: 08/03/2022 CLINICAL DATA:  Fall EXAM: DG HIP (WITH OR WITHOUT PELVIS) 3V LEFT COMPARISON:  None Available. FINDINGS: Osseous structures are osteopenic. Lumbosacral degenerative changes are noted. There is no acute fracture, dislocation or subluxation identified. IMPRESSION: Osteopenia. Lumbosacral degenerative changes. No acute osseous abnormalities. Electronically Signed   By: Layla Maw M.D.   On: 08/03/2022 11:09    Microbiology: Results for orders placed or performed during the hospital encounter of 08/03/22  SARS Coronavirus 2 by RT PCR (hospital  order, performed in [facility] hospital lab) *cepheid single result test* Anterior Nasal Swab     Status: None   Collection Time: 08/03/22 11:50 AM   Specimen: Anterior Nasal Swab  Result Value Ref Range Status   SARS Coronavirus 2 by RT PCR NEGATIVE NEGATIVE Final    Comment: (NOTE) SARS-CoV-2 target nucleic acids are NOT DETECTED.  The SARS-CoV-2 RNA is generally detectable in upper and lower respiratory specimens during the acute phase of infection. The lowest concentration of SARS-CoV-2 viral copies this assay can detect is 250 copies / mL. A negative result does not preclude SARS-CoV-2 infection and should not be used as the sole basis for treatment or other patient management decisions.  A negative result may occur with improper specimen collection / handling, submission of specimen other than nasopharyngeal swab, presence of viral mutation(s) within the areas targeted by this assay, and inadequate number of viral copies (<250 copies / mL). A negative result must be combined with clinical observations, patient history, and epidemiological information.  Fact Sheet for Patients:   RoadLapTop.co.za  Fact Sheet for Healthcare Providers: http://kim-miller.com/  This test is not yet approved or  cleared by the [place] FDA and has been authorized for detection and/or diagnosis of SARS-CoV-2 by FDA under an Emergency Use Authorization (EUA).  This EUA will remain in effect (meaning this test can be used) for the duration of the COVID-19 declaration under Section 564(b)(1) of the Act, 21 U.S.C. section 360bbb-3(b)(1), unless the authorization is terminated or revoked sooner.  Performed at [facility], [address] [place], [place] [address]     Labs: CBC: Recent Labs  Lab 08/03/22 1037 08/04/22 0502  WBC 11.9* 10.5  HGB 14.4 13.0  HCT 43.8 38.9  MCV 90.1 89.4  PLT 287 246   Basic Metabolic Panel: Recent  Labs  Lab 08/03/22 1037 08/03/22 1849 08/04/22 0027 08/04/22 0502 08/05/22 0507  NA 135 133* 134* 136 137  K 2.6* 2.5* 3.5 3.9 3.4*  CL 85* 87* 91* 94* 99  CO2 33* 32 29 30 29   GLUCOSE 156* 238* 155* 148* 121*  BUN 23 20 22 20  25*  CREATININE 1.16* 0.91 0.95 0.87 0.99  CALCIUM 9.3 8.7* 8.7* 8.9 8.9  MG  --  2.0  --   --  2.1   Liver Function  Tests: Recent Labs  Lab 08/04/22 0502  AST 19  ALT 11  ALKPHOS 57  BILITOT 1.0  PROT 6.0*  ALBUMIN 3.3*   CBG: Recent Labs  Lab 08/04/22 1138 08/04/22 1604 08/04/22 2130 08/05/22 0741 08/05/22 1203  GLUCAP 187* 148* 133* 95 194*    Discharge time spent: greater than 30 minutes.  Signed: Baldwin Jamaica, MD [facility] 08/05/2022

## 2022-08-05 NOTE — Progress Notes (Addendum)
   Mobility Specialist - Progress Note     08/05/22 1143  Mobility  Activity Ambulated with assistance in hallway;Stood at bedside;Dangled on edge of bed  Level of Assistance Minimal assist, patient does 75% or more  Assistive Device Front wheel walker  Distance Ambulated (ft) 160 ft  Range of Motion/Exercises Active  Activity Response Tolerated well  Mobility Referral Yes  $Mobility charge 1 Mobility    Nurse requested Mobility Specialist to perform oxygen saturation test with pt which includes removing pt from oxygen both at rest and while ambulating.  Below are the results from that testing.      Patient Saturations on Room Air at Rest = spO2 96%   Patient Saturations on Room Air while Ambulating = sp02 79% after 41ft .  Rested and performed pursed lip breathing for 1 minute with sp02 at 82%. Pt took immediate seated rest break before resuming ambulation on 2L for remainder of mobility session. Pt O2 recovered to 98%.   Patient Saturations on 2 Liters of oxygen while Ambulating = sp02 98%.    At end of testing pt left in room on RA with needs in reach. Daughter present at bedside.   Reported results to nurse.       Johnathan Hausen Mobility Specialist 08/05/22, 11:50 AM

## 2022-08-05 NOTE — Progress Notes (Signed)
Patient's daughter Vanessa [name] provided with discharge instructions, including medications, follow up appointments and signs and symptoms to seek emergent care. Daughter verbalized understanding of instructions, IV removed. Oxygen delivered to room.

## 2022-08-06 ENCOUNTER — Telehealth: Payer: Self-pay | Admitting: *Deleted

## 2022-08-06 DIAGNOSIS — I5042 Chronic combined systolic (congestive) and diastolic (congestive) heart failure: Secondary | ICD-10-CM

## 2022-08-06 NOTE — Telephone Encounter (Signed)
I spoke with [name]; [name] said pt was discharged from hospital on 08/05/22 with UTI and pneumonia; [name] said that pt is about the same with getting up very frequently to urinate. [name] said initially was trying to assist pt with walker to bathroom but that did not work; pt had several accidents and pt was extremely weak trying to ambulate; now pt gets up from sofa with 2 people assisting and the pt sits on bedside commode and then is assisted by 2 people back to the sofa. Pts is on 2 L oxygen round the clock. [name] said when pt stands and turns to sit on bedside commode pts oxygen level goes between 80 and mid 80s. After pt gets back on sofa and takes some deep breaths the  oxygen level will go back up to high 80s or low 90s. Pts legs are very weak and pt having a lot of trouble motivating. [name] then said after that pt weakness about same as yesterday; pt does not have CP but is coughing on and off; when sleeping pt may appear like some distress with breathing. Pt is taking omnicef and doxycyline as instructed. Pt does not have good appetite but pt is eating and drinking per [name]. [name] said pt has urinated at least 12 or more times today. [name] is not sure if lasix 20 mg is causing pt to urinate so much or if the UTI is causing the frequency. Pt is not complaining with pain upon urination. Pt does not have fever. Dr Para March is concerned with pt being on oxygen and still oxygen levels being in low to mid 80s. Dr Para March said pt needs to go to ED for evaluation. [name] said pt was sent home like this yesterday except oxygen level is worse today and wants to know what will be done if goes back to ED. Pt would be reevaluated with probably more testing. [name] wants me to ask Dr Para March what would be done different today. Dr Para March said pt needs evaluation to see what can be done to assist pt to breath better and get more oxygen in her system. Dr Para March did say if [name] still declines taking pt to ED he is sympathetic and  could try when exerting pt to use oxygen at 4 L to see if that helps to keep oxygen level from going into low to mid eighties.if pts oxygen level recovers quickly then decrease oxygen to 2 L upon rest. And pt needs FU appt at [facility], [name] said she will speak with her family members and will decide if pt is going back to ED or not. Scheduled FU appt with Dr Para March on 08/10/22 at 3 PM if pt does not get hospitalized with UC & ED precautions and [name] voiced understanding; [name] said her sister has not gotten there yet so [name] does not know if taking pt to ED at this point but [name] will cb with family decision. Sending note to Dr Para March and Para March pool.  [name] also said Dr Alberteen Spindle stopped ASA but [facility] restarted ASA 81 mg daily. Pt had been told to take thyroid med on empty stomach and now hospital advised to take with food. [name] wants to know what Dr Para March would advise. Sending note to Dr Para March and Para March pool.

## 2022-08-06 NOTE — Telephone Encounter (Signed)
Please check on patient/family.  If lower O2 level with exertion, then okay to inc to 3-4 L O2 with exertion.  If O2 sats improve with rest, then can decrease to 2L O2.    Goal O2 sats 92% or higher.    However, if more SOB/actively declining, then needs recheck ASAP.    Needs OV when possible/soon.    Thanks.

## 2022-08-06 NOTE — Telephone Encounter (Signed)
Please get update on patient on Tuesday.  I put in a referral for cardiology.  Thanks.

## 2022-08-06 NOTE — Addendum Note (Signed)
Addended by: Joaquim Nam on: 08/06/2022 10:47 PM   Modules accepted: Orders

## 2022-08-06 NOTE — Transitions of Care (Post Inpatient/ED Visit) (Signed)
   08/06/2022  Name: Vanessa [name] MRN: [id number] DOB: [birth year]  Today's TOC FU Call Status: Today's TOC FU Call Status:: Successful TOC FU Call Competed TOC FU Call Complete Date: 08/06/22  Transition Care Management Follow-up Telephone Call Date of Discharge: 08/05/22 Discharge Facility: [facility]) Type of Discharge: Inpatient Admission Primary Inpatient Discharge Diagnosis:: Acute respiratory failure with hypoxia How have you been since you were released from the hospital?: Better (Patient is having frequency to void every 15 min) Any questions or concerns?: Yes Patient Questions/Concerns:: patient frequency to void. Pt daughter wants a referral to another cardiologist Patient Questions/Concerns Addressed: Notified Provider of Patient Questions/Concerns  Items Reviewed: Did you receive and understand the discharge instructions provided?: Yes Medications obtained and verified?: Yes (Medications Reviewed) Dietary orders reviewed?: No Do you have support at home?: Yes People in Home: child(ren), adult Name of Support/Comfort Primary Source: [facility] and Equipment/Supplies: Were Home Health Services Ordered?: Yes Name of Home Health Agency:: [facility] Has Agency set up a time to come to your home?: Yes First Home Health Visit Date: 08/06/22 Any new equipment or medical supplies ordered?: Yes Name of Medical supply agency?: [employer] Were you able to get the equipment/medical supplies?: Yes Do you have any questions related to the use of the equipment/supplies?: No  Functional Questionnaire: Do you need assistance with bathing/showering or dressing?: Yes Do you need assistance with meal preparation?: Yes Do you need assistance with eating?: No Do you have difficulty maintaining continence: Yes Do you need assistance with getting out of bed/getting out of a chair/moving?: No Do you have difficulty managing or taking your medications?:  Yes  Follow up appointments reviewed: PCP Follow-up appointment confirmed?: NA Specialist Hospital Follow-up appointment confirmed?: No Reason Specialist Follow-Up Not Confirmed: Patient has Specialist Provider Number and will Call for Appointment (patient daughter is calling PCP for referral to cardiologist) Do you need transportation to your follow-up appointment?: No Do you understand care options if your condition(s) worsen?: Yes-patient verbalized understanding  SDOH Interventions Today    Flowsheet Row Most Recent Value  SDOH Interventions   Food Insecurity Interventions Intervention Not Indicated  Housing Interventions Intervention Not Indicated  Transportation Interventions Intervention Not Indicated      Interventions Today    Flowsheet Row Most Recent Value  General Interventions   General Interventions Discussed/Reviewed General Interventions Discussed, General Interventions Reviewed, Doctor Visits  Doctor Visits Discussed/Reviewed Doctor Visits Discussed, Doctor Visits Reviewed  [patien tdaughter  had called PCP. She wants a video visit and a referral to another cardiologist.]      TOC Interventions Today    Flowsheet Row Most Recent Value  TOC Interventions   TOC Interventions Discussed/Reviewed TOC Interventions Discussed, TOC Interventions Reviewed        Gean Maidens BSN RN [facility] [phone]

## 2022-08-07 ENCOUNTER — Telehealth: Payer: Self-pay | Admitting: *Deleted

## 2022-08-07 ENCOUNTER — Telehealth: Payer: Self-pay | Admitting: Family Medicine

## 2022-08-07 DIAGNOSIS — H409 Unspecified glaucoma: Secondary | ICD-10-CM | POA: Diagnosis not present

## 2022-08-07 DIAGNOSIS — J440 Chronic obstructive pulmonary disease with acute lower respiratory infection: Secondary | ICD-10-CM | POA: Diagnosis not present

## 2022-08-07 DIAGNOSIS — F0280 Dementia in other diseases classified elsewhere without behavioral disturbance: Secondary | ICD-10-CM | POA: Diagnosis not present

## 2022-08-07 DIAGNOSIS — G4700 Insomnia, unspecified: Secondary | ICD-10-CM | POA: Diagnosis not present

## 2022-08-07 DIAGNOSIS — I2510 Atherosclerotic heart disease of native coronary artery without angina pectoris: Secondary | ICD-10-CM | POA: Diagnosis not present

## 2022-08-07 DIAGNOSIS — K219 Gastro-esophageal reflux disease without esophagitis: Secondary | ICD-10-CM | POA: Diagnosis not present

## 2022-08-07 DIAGNOSIS — N179 Acute kidney failure, unspecified: Secondary | ICD-10-CM | POA: Diagnosis not present

## 2022-08-07 DIAGNOSIS — E039 Hypothyroidism, unspecified: Secondary | ICD-10-CM | POA: Diagnosis not present

## 2022-08-07 DIAGNOSIS — N390 Urinary tract infection, site not specified: Secondary | ICD-10-CM | POA: Diagnosis not present

## 2022-08-07 DIAGNOSIS — M1990 Unspecified osteoarthritis, unspecified site: Secondary | ICD-10-CM | POA: Diagnosis not present

## 2022-08-07 DIAGNOSIS — Z7982 Long term (current) use of aspirin: Secondary | ICD-10-CM | POA: Diagnosis not present

## 2022-08-07 DIAGNOSIS — D649 Anemia, unspecified: Secondary | ICD-10-CM | POA: Diagnosis not present

## 2022-08-07 DIAGNOSIS — I480 Paroxysmal atrial fibrillation: Secondary | ICD-10-CM | POA: Diagnosis not present

## 2022-08-07 DIAGNOSIS — I5043 Acute on chronic combined systolic (congestive) and diastolic (congestive) heart failure: Secondary | ICD-10-CM | POA: Diagnosis not present

## 2022-08-07 DIAGNOSIS — I110 Hypertensive heart disease with heart failure: Secondary | ICD-10-CM | POA: Diagnosis not present

## 2022-08-07 DIAGNOSIS — G43909 Migraine, unspecified, not intractable, without status migrainosus: Secondary | ICD-10-CM | POA: Diagnosis not present

## 2022-08-07 DIAGNOSIS — Z9981 Dependence on supplemental oxygen: Secondary | ICD-10-CM | POA: Diagnosis not present

## 2022-08-07 DIAGNOSIS — I272 Pulmonary hypertension, unspecified: Secondary | ICD-10-CM | POA: Diagnosis not present

## 2022-08-07 DIAGNOSIS — M5137 Other intervertebral disc degeneration, lumbosacral region: Secondary | ICD-10-CM | POA: Diagnosis not present

## 2022-08-07 DIAGNOSIS — J432 Centrilobular emphysema: Secondary | ICD-10-CM | POA: Diagnosis not present

## 2022-08-07 DIAGNOSIS — J189 Pneumonia, unspecified organism: Secondary | ICD-10-CM | POA: Diagnosis not present

## 2022-08-07 DIAGNOSIS — J441 Chronic obstructive pulmonary disease with (acute) exacerbation: Secondary | ICD-10-CM | POA: Diagnosis not present

## 2022-08-07 DIAGNOSIS — R7303 Prediabetes: Secondary | ICD-10-CM | POA: Diagnosis not present

## 2022-08-07 DIAGNOSIS — E785 Hyperlipidemia, unspecified: Secondary | ICD-10-CM | POA: Diagnosis not present

## 2022-08-07 NOTE — Telephone Encounter (Signed)
Spoke with [name] and patient is doing much much better today. She said she's been able to get up and walk around with her walker and did PT with no problems today. She only got up one time last night to go to the bathroom. O2 is at 100% right now. [name] wants to know if her o2 is up does she still need to be on the oxygen; currently on 2L?

## 2022-08-07 NOTE — Telephone Encounter (Signed)
Home Health verbal orders Caller Name: Romilda Garret, Physical Therapist  Agency Name: [facility]  Callback number: [phone]  Requesting OT/PT/Skilled nursing/Social Work/Speech: PT   Reason: Lower extremity weakness, risk of fall, gait instability   Frequency: 1 wk 7  Please forward to [facility] pool or providers CMA

## 2022-08-07 NOTE — Patient Outreach (Signed)
  Care Coordination   Follow Up Visit Note   08/07/2022 Name: Vanessa [name] MRN: [id number] DOB: 12-12-1934  Vanessa [name] is a 88 y.o. year old female who sees Joaquim Nam, MD for primary care. I spoke with Steward Drone daughter by phone today.  What matters to the patients health and wellness today?  She wanted to know how she could tell if the medication is working and her mother UTI is getting better. RN explained that since the patient was having an urgency every 15 minutes and now it has decreased a lot that is an indication with the urgency decreasing. Decreasing in cloudiness of urine. Decrease in the urine odor. The Dr can do a urine test and check also.    Care Coordination Interventions:  Yes, provided   Follow up plan: No further intervention required. Patient daughter will follow up with provider if symptoms worsen.  Encounter Outcome:  Pt. Visit Completed   Gean Maidens BSN RN [facility] [phone]

## 2022-08-08 ENCOUNTER — Telehealth: Payer: Self-pay | Admitting: Family Medicine

## 2022-08-08 NOTE — Telephone Encounter (Signed)
LMTCB

## 2022-08-08 NOTE — Telephone Encounter (Signed)
Home Health verbal orders Caller Name:STEPHANIE Agency Name: [facility] number: [phone]  Requesting OT/PT/Skilled nursing/Social Work/Speech: OT  Reason: Vincente Poli ABILITY TO CARE FOR SELF  Frequency:1 KW 6  Please forward to [facility] pool or providers CMA

## 2022-08-08 NOTE — Telephone Encounter (Signed)
LM for kelly to call back

## 2022-08-08 NOTE — Telephone Encounter (Signed)
Spoke with [name] and advised her about the O2. She verbalized understanding and stated she was going to lower her o2 now and monitor how she does.

## 2022-08-08 NOTE — Telephone Encounter (Signed)
Verbal orders given to Stephanie at [facility] 

## 2022-08-08 NOTE — Telephone Encounter (Signed)
Verbal orders given to Soni. 

## 2022-08-08 NOTE — Telephone Encounter (Signed)
Please give the order.  Thanks.   

## 2022-08-08 NOTE — Telephone Encounter (Signed)
Glad she is better.  Goal O2 is 92% or above.  She can cut back by 1L at a time to see how she does.  She may end up needing more O2 with activity but less or none at rest.  Please let me know how it goes.  I thank all involved.

## 2022-08-10 ENCOUNTER — Ambulatory Visit (INDEPENDENT_AMBULATORY_CARE_PROVIDER_SITE_OTHER): Payer: PPO | Admitting: Family Medicine

## 2022-08-10 VITALS — BP 110/68 | HR 67 | Temp 97.3°F | Ht 64.0 in

## 2022-08-10 DIAGNOSIS — I5042 Chronic combined systolic (congestive) and diastolic (congestive) heart failure: Secondary | ICD-10-CM

## 2022-08-10 DIAGNOSIS — R0602 Shortness of breath: Secondary | ICD-10-CM | POA: Diagnosis not present

## 2022-08-10 DIAGNOSIS — F0390 Unspecified dementia without behavioral disturbance: Secondary | ICD-10-CM

## 2022-08-10 DIAGNOSIS — E119 Type 2 diabetes mellitus without complications: Secondary | ICD-10-CM | POA: Diagnosis not present

## 2022-08-10 DIAGNOSIS — J9601 Acute respiratory failure with hypoxia: Secondary | ICD-10-CM

## 2022-08-10 DIAGNOSIS — R9431 Abnormal electrocardiogram [ECG] [EKG]: Secondary | ICD-10-CM | POA: Diagnosis not present

## 2022-08-10 LAB — CBC WITH DIFFERENTIAL/PLATELET
Absolute Monocytes: 755 cells/uL (ref 200–950)
HCT: 43.8 % (ref 35.0–45.0)
MCH: 30 pg (ref 27.0–33.0)
MCV: 90.5 fL (ref 80.0–100.0)
Monocytes Relative: 7.4 %
Neutrophils Relative %: 72.3 %
RBC: 4.84 10*6/uL (ref 3.80–5.10)
RDW: 12.7 % (ref 11.0–15.0)
Total Lymphocyte: 18 %

## 2022-08-10 MED ORDER — POTASSIUM 99 MG PO TABS: 2.0000 | ORAL_TABLET | Freq: Every day | ORAL | Status: DC

## 2022-08-10 NOTE — Patient Instructions (Addendum)
Let me know if you don't hear about seeing cardiology.  Take care.  Glad to see you. Go to the lab on the way out.   If you have mychart we'll likely use that to update you.    Goal O2 above 92%.   If AM weight is above 123 lbs, let us know.   Finish the antibiotics as planned.

## 2022-08-10 NOTE — Progress Notes (Unsigned)
O2 usually 97% or higher, now down to 1L.  Walked back from bathroom off O2 with sats down to 88%.     Check on echo report.  I don't see report yet.  Update patient/family.    She has some urinary urgency and frequency.   No burning.    Not SOB.  No cough.  No fevers.    Weight now at home 121 lbs.     Needs FL2 for home care.  Family will send me papers.

## 2022-08-11 LAB — CBC WITH DIFFERENTIAL/PLATELET
Basophils Absolute: 31 cells/uL (ref 0–200)
Basophils Relative: 0.3 %
Eosinophils Absolute: 204 cells/uL (ref 15–500)
Eosinophils Relative: 2 %
Hemoglobin: 14.5 g/dL (ref 11.7–15.5)
Lymphs Abs: 1836 cells/uL (ref 850–3900)
MCHC: 33.1 g/dL (ref 32.0–36.0)
MPV: 11.1 fL (ref 7.5–12.5)
Neutro Abs: 7375 cells/uL (ref 1500–7800)
Platelets: 321 10*3/uL (ref 140–400)
WBC: 10.2 10*3/uL (ref 3.8–10.8)

## 2022-08-11 LAB — BASIC METABOLIC PANEL
BUN/Creatinine Ratio: 25 (calc) — ABNORMAL HIGH (ref 6–22)
BUN: 30 mg/dL — ABNORMAL HIGH (ref 7–25)
CO2: 31 mmol/L (ref 20–32)
Calcium: 9.5 mg/dL (ref 8.6–10.4)
Chloride: 97 mmol/L — ABNORMAL LOW (ref 98–110)
Creat: 1.18 mg/dL — ABNORMAL HIGH (ref 0.60–0.95)
Glucose, Bld: 158 mg/dL — ABNORMAL HIGH (ref 65–99)
Potassium: 4.9 mmol/L (ref 3.5–5.3)
Sodium: 138 mmol/L (ref 135–146)

## 2022-08-11 LAB — BRAIN NATRIURETIC PEPTIDE: Brain Natriuretic Peptide: 144 pg/mL — ABNORMAL HIGH (ref <100)

## 2022-08-12 ENCOUNTER — Telehealth: Payer: Self-pay | Admitting: Family Medicine

## 2022-08-12 DIAGNOSIS — E119 Type 2 diabetes mellitus without complications: Secondary | ICD-10-CM | POA: Insufficient documentation

## 2022-08-12 NOTE — Assessment & Plan Note (Signed)
Would expect more memory difficulty in the setting of acute illness.  See following phone note regarding Aricept and QT interval.  Family is supportive and they may end up needing forms filled out for home care.  I can work on those if needed.

## 2022-08-12 NOTE — Assessment & Plan Note (Addendum)
Family wanted to reestablish with a new cardiologist/get second opinion.  Referral placed.  I need cardiology input regarding flecainide management.  See following phone note.

## 2022-08-12 NOTE — Telephone Encounter (Signed)
Please update family.  I realized this after visit but given her situation with her QT interval it would make sense to stop donepezil in the meantime, at least until she can follow-up with cardiology.  I took it off her med list.  Please have them remove this from her daily medications for now.    Please update family about a note I got from Dr. Graciela Husbands.  He was offering referral to pulmonary.  She does have a hiatal hernia which can compress the lower portions of the lungs but my concern is that trying to do anything about this would involve surgery and my concern is that she would not be a good candidate for that right now.  But please check with family let me know if they want me to put in the pulmonary referral.  Her blood counts are stable.  Her creatinine is only slightly worse than prior and her BNP is minimally elevated at 144. Her potassium is normal.I would continue as planned with Lasix for now.  Please let me know how she is feeling over the next few days.    Thanks.

## 2022-08-12 NOTE — Assessment & Plan Note (Signed)
Recent diagnosis with A1c 6.5.  No medications right now.  I do not want to induce hypoglycemia.  We can follow this periodically.

## 2022-08-12 NOTE — Assessment & Plan Note (Addendum)
Appears euvolemic.  Overall her supplemental O2 requirement has decreased.  See notes on labs.  Will check on echo report.  Continue furosemide 20 mg daily for now.  Family was more focused on reestablishing with cardiology instead of following with pulmonary right now.  I think that is a reasonable consideration for now.

## 2022-08-12 NOTE — Assessment & Plan Note (Signed)
Would continue with supplemental O2 in the meantime with the expectation that she may be able to taper down gradually.  She may end up needing along with exertion and then hopefully not at all.  She is going to finish her antibiotics in the near future.  At this point okay for outpatient follow-up.  Lungs are clear.

## 2022-08-13 DIAGNOSIS — J432 Centrilobular emphysema: Secondary | ICD-10-CM | POA: Diagnosis not present

## 2022-08-13 DIAGNOSIS — J9601 Acute respiratory failure with hypoxia: Secondary | ICD-10-CM | POA: Diagnosis not present

## 2022-08-13 DIAGNOSIS — I2510 Atherosclerotic heart disease of native coronary artery without angina pectoris: Secondary | ICD-10-CM | POA: Diagnosis not present

## 2022-08-13 DIAGNOSIS — M1990 Unspecified osteoarthritis, unspecified site: Secondary | ICD-10-CM | POA: Diagnosis not present

## 2022-08-13 DIAGNOSIS — N390 Urinary tract infection, site not specified: Secondary | ICD-10-CM | POA: Diagnosis not present

## 2022-08-13 DIAGNOSIS — I110 Hypertensive heart disease with heart failure: Secondary | ICD-10-CM | POA: Diagnosis not present

## 2022-08-13 DIAGNOSIS — R7303 Prediabetes: Secondary | ICD-10-CM | POA: Diagnosis not present

## 2022-08-13 DIAGNOSIS — I5043 Acute on chronic combined systolic (congestive) and diastolic (congestive) heart failure: Secondary | ICD-10-CM | POA: Diagnosis not present

## 2022-08-13 DIAGNOSIS — N179 Acute kidney failure, unspecified: Secondary | ICD-10-CM | POA: Diagnosis not present

## 2022-08-13 DIAGNOSIS — Z7982 Long term (current) use of aspirin: Secondary | ICD-10-CM | POA: Diagnosis not present

## 2022-08-13 DIAGNOSIS — I480 Paroxysmal atrial fibrillation: Secondary | ICD-10-CM | POA: Diagnosis not present

## 2022-08-13 DIAGNOSIS — D649 Anemia, unspecified: Secondary | ICD-10-CM | POA: Diagnosis not present

## 2022-08-13 DIAGNOSIS — E785 Hyperlipidemia, unspecified: Secondary | ICD-10-CM | POA: Diagnosis not present

## 2022-08-13 DIAGNOSIS — G4700 Insomnia, unspecified: Secondary | ICD-10-CM | POA: Diagnosis not present

## 2022-08-13 DIAGNOSIS — J189 Pneumonia, unspecified organism: Secondary | ICD-10-CM | POA: Diagnosis not present

## 2022-08-13 DIAGNOSIS — Z9981 Dependence on supplemental oxygen: Secondary | ICD-10-CM | POA: Diagnosis not present

## 2022-08-13 DIAGNOSIS — H409 Unspecified glaucoma: Secondary | ICD-10-CM | POA: Diagnosis not present

## 2022-08-13 DIAGNOSIS — F0280 Dementia in other diseases classified elsewhere without behavioral disturbance: Secondary | ICD-10-CM | POA: Diagnosis not present

## 2022-08-13 DIAGNOSIS — M5137 Other intervertebral disc degeneration, lumbosacral region: Secondary | ICD-10-CM | POA: Diagnosis not present

## 2022-08-13 DIAGNOSIS — G43909 Migraine, unspecified, not intractable, without status migrainosus: Secondary | ICD-10-CM | POA: Diagnosis not present

## 2022-08-13 DIAGNOSIS — J441 Chronic obstructive pulmonary disease with (acute) exacerbation: Secondary | ICD-10-CM | POA: Diagnosis not present

## 2022-08-13 DIAGNOSIS — J440 Chronic obstructive pulmonary disease with acute lower respiratory infection: Secondary | ICD-10-CM | POA: Diagnosis not present

## 2022-08-13 DIAGNOSIS — I272 Pulmonary hypertension, unspecified: Secondary | ICD-10-CM | POA: Diagnosis not present

## 2022-08-13 DIAGNOSIS — E039 Hypothyroidism, unspecified: Secondary | ICD-10-CM | POA: Diagnosis not present

## 2022-08-13 DIAGNOSIS — K219 Gastro-esophageal reflux disease without esophagitis: Secondary | ICD-10-CM | POA: Diagnosis not present

## 2022-08-13 NOTE — Telephone Encounter (Signed)
Spoke with patients daughter [name]; okay per DPR. Give results and discussed everything below from Dr. Para March. They do not want to move forward with the pulmonary referral. They are going to pull her donepezil for now until otherwise advised. Advised to let us know how she's feeling over the next few days.

## 2022-08-14 DIAGNOSIS — J432 Centrilobular emphysema: Secondary | ICD-10-CM | POA: Diagnosis not present

## 2022-08-14 DIAGNOSIS — N179 Acute kidney failure, unspecified: Secondary | ICD-10-CM | POA: Diagnosis not present

## 2022-08-14 DIAGNOSIS — D649 Anemia, unspecified: Secondary | ICD-10-CM | POA: Diagnosis not present

## 2022-08-14 DIAGNOSIS — J440 Chronic obstructive pulmonary disease with acute lower respiratory infection: Secondary | ICD-10-CM | POA: Diagnosis not present

## 2022-08-14 DIAGNOSIS — F0280 Dementia in other diseases classified elsewhere without behavioral disturbance: Secondary | ICD-10-CM | POA: Diagnosis not present

## 2022-08-14 DIAGNOSIS — M5137 Other intervertebral disc degeneration, lumbosacral region: Secondary | ICD-10-CM | POA: Diagnosis not present

## 2022-08-14 DIAGNOSIS — H409 Unspecified glaucoma: Secondary | ICD-10-CM | POA: Diagnosis not present

## 2022-08-14 DIAGNOSIS — I5043 Acute on chronic combined systolic (congestive) and diastolic (congestive) heart failure: Secondary | ICD-10-CM | POA: Diagnosis not present

## 2022-08-14 DIAGNOSIS — I272 Pulmonary hypertension, unspecified: Secondary | ICD-10-CM | POA: Diagnosis not present

## 2022-08-14 DIAGNOSIS — K219 Gastro-esophageal reflux disease without esophagitis: Secondary | ICD-10-CM | POA: Diagnosis not present

## 2022-08-14 DIAGNOSIS — J441 Chronic obstructive pulmonary disease with (acute) exacerbation: Secondary | ICD-10-CM | POA: Diagnosis not present

## 2022-08-14 DIAGNOSIS — I110 Hypertensive heart disease with heart failure: Secondary | ICD-10-CM | POA: Diagnosis not present

## 2022-08-14 DIAGNOSIS — J189 Pneumonia, unspecified organism: Secondary | ICD-10-CM | POA: Diagnosis not present

## 2022-08-14 DIAGNOSIS — E785 Hyperlipidemia, unspecified: Secondary | ICD-10-CM | POA: Diagnosis not present

## 2022-08-14 DIAGNOSIS — G4700 Insomnia, unspecified: Secondary | ICD-10-CM | POA: Diagnosis not present

## 2022-08-14 DIAGNOSIS — I2510 Atherosclerotic heart disease of native coronary artery without angina pectoris: Secondary | ICD-10-CM | POA: Diagnosis not present

## 2022-08-14 DIAGNOSIS — Z9981 Dependence on supplemental oxygen: Secondary | ICD-10-CM | POA: Diagnosis not present

## 2022-08-14 DIAGNOSIS — I480 Paroxysmal atrial fibrillation: Secondary | ICD-10-CM | POA: Diagnosis not present

## 2022-08-14 DIAGNOSIS — G43909 Migraine, unspecified, not intractable, without status migrainosus: Secondary | ICD-10-CM | POA: Diagnosis not present

## 2022-08-14 DIAGNOSIS — E039 Hypothyroidism, unspecified: Secondary | ICD-10-CM | POA: Diagnosis not present

## 2022-08-14 DIAGNOSIS — N390 Urinary tract infection, site not specified: Secondary | ICD-10-CM | POA: Diagnosis not present

## 2022-08-14 DIAGNOSIS — Z7982 Long term (current) use of aspirin: Secondary | ICD-10-CM | POA: Diagnosis not present

## 2022-08-14 DIAGNOSIS — M1990 Unspecified osteoarthritis, unspecified site: Secondary | ICD-10-CM | POA: Diagnosis not present

## 2022-08-14 DIAGNOSIS — R7303 Prediabetes: Secondary | ICD-10-CM | POA: Diagnosis not present

## 2022-08-18 ENCOUNTER — Other Ambulatory Visit: Payer: Self-pay | Admitting: Internal Medicine

## 2022-08-20 ENCOUNTER — Telehealth: Payer: Self-pay

## 2022-08-20 DIAGNOSIS — N179 Acute kidney failure, unspecified: Secondary | ICD-10-CM | POA: Diagnosis not present

## 2022-08-20 DIAGNOSIS — J441 Chronic obstructive pulmonary disease with (acute) exacerbation: Secondary | ICD-10-CM | POA: Diagnosis not present

## 2022-08-20 DIAGNOSIS — H409 Unspecified glaucoma: Secondary | ICD-10-CM

## 2022-08-20 DIAGNOSIS — F0280 Dementia in other diseases classified elsewhere without behavioral disturbance: Secondary | ICD-10-CM | POA: Diagnosis not present

## 2022-08-20 DIAGNOSIS — Z8781 Personal history of (healed) traumatic fracture: Secondary | ICD-10-CM

## 2022-08-20 DIAGNOSIS — K219 Gastro-esophageal reflux disease without esophagitis: Secondary | ICD-10-CM

## 2022-08-20 DIAGNOSIS — N390 Urinary tract infection, site not specified: Secondary | ICD-10-CM | POA: Diagnosis not present

## 2022-08-20 DIAGNOSIS — M1990 Unspecified osteoarthritis, unspecified site: Secondary | ICD-10-CM

## 2022-08-20 DIAGNOSIS — I480 Paroxysmal atrial fibrillation: Secondary | ICD-10-CM

## 2022-08-20 DIAGNOSIS — E785 Hyperlipidemia, unspecified: Secondary | ICD-10-CM

## 2022-08-20 DIAGNOSIS — Z7982 Long term (current) use of aspirin: Secondary | ICD-10-CM

## 2022-08-20 DIAGNOSIS — E039 Hypothyroidism, unspecified: Secondary | ICD-10-CM | POA: Diagnosis not present

## 2022-08-20 DIAGNOSIS — D649 Anemia, unspecified: Secondary | ICD-10-CM

## 2022-08-20 DIAGNOSIS — Z961 Presence of intraocular lens: Secondary | ICD-10-CM

## 2022-08-20 DIAGNOSIS — I5043 Acute on chronic combined systolic (congestive) and diastolic (congestive) heart failure: Secondary | ICD-10-CM | POA: Diagnosis not present

## 2022-08-20 DIAGNOSIS — J440 Chronic obstructive pulmonary disease with acute lower respiratory infection: Secondary | ICD-10-CM | POA: Diagnosis not present

## 2022-08-20 DIAGNOSIS — Z87891 Personal history of nicotine dependence: Secondary | ICD-10-CM

## 2022-08-20 DIAGNOSIS — Z9981 Dependence on supplemental oxygen: Secondary | ICD-10-CM

## 2022-08-20 DIAGNOSIS — Z9181 History of falling: Secondary | ICD-10-CM

## 2022-08-20 DIAGNOSIS — Z9842 Cataract extraction status, left eye: Secondary | ICD-10-CM

## 2022-08-20 DIAGNOSIS — Z5982 Transportation insecurity: Secondary | ICD-10-CM

## 2022-08-20 DIAGNOSIS — R7303 Prediabetes: Secondary | ICD-10-CM

## 2022-08-20 DIAGNOSIS — I110 Hypertensive heart disease with heart failure: Secondary | ICD-10-CM | POA: Diagnosis not present

## 2022-08-20 DIAGNOSIS — I2510 Atherosclerotic heart disease of native coronary artery without angina pectoris: Secondary | ICD-10-CM | POA: Diagnosis not present

## 2022-08-20 DIAGNOSIS — G4700 Insomnia, unspecified: Secondary | ICD-10-CM

## 2022-08-20 DIAGNOSIS — Z792 Long term (current) use of antibiotics: Secondary | ICD-10-CM

## 2022-08-20 DIAGNOSIS — G43909 Migraine, unspecified, not intractable, without status migrainosus: Secondary | ICD-10-CM

## 2022-08-20 DIAGNOSIS — J432 Centrilobular emphysema: Secondary | ICD-10-CM | POA: Diagnosis not present

## 2022-08-20 DIAGNOSIS — J439 Emphysema, unspecified: Secondary | ICD-10-CM

## 2022-08-20 DIAGNOSIS — I272 Pulmonary hypertension, unspecified: Secondary | ICD-10-CM | POA: Diagnosis not present

## 2022-08-20 DIAGNOSIS — J189 Pneumonia, unspecified organism: Secondary | ICD-10-CM | POA: Diagnosis not present

## 2022-08-20 DIAGNOSIS — J449 Chronic obstructive pulmonary disease, unspecified: Secondary | ICD-10-CM

## 2022-08-20 DIAGNOSIS — M5137 Other intervertebral disc degeneration, lumbosacral region: Secondary | ICD-10-CM

## 2022-08-20 NOTE — Telephone Encounter (Signed)
-----   Message from Duke Salvia, MD sent at 08/10/2022  3:29 PM EDT ----- Please Inform Patient that CT scan is abnormal demonstrating emphysema but also hiatal hernia with a comment of 4/14 "complete intrathoracic position of the stomach.  Compressive atelectasis of the lung bases secondary to hernia."   To my read this may be more prominent than CT scan from 2019.  I think is probably worth making a referral to pulmonary for their input regarding this as well as any things that can be done for the emphysema  Thanks

## 2022-08-20 NOTE — Telephone Encounter (Signed)
Attempted phone call to [phone], daughter DPR.  OK per EPIC to leave detailed voicemail message.  Advised of Dr Odessa Fleming comments as below.  Advised to contact [phone] for any further questions or concerns. Referral placed to pulmonary.

## 2022-08-21 ENCOUNTER — Encounter: Payer: Self-pay | Admitting: *Deleted

## 2022-08-23 DIAGNOSIS — K219 Gastro-esophageal reflux disease without esophagitis: Secondary | ICD-10-CM | POA: Diagnosis not present

## 2022-08-23 DIAGNOSIS — E039 Hypothyroidism, unspecified: Secondary | ICD-10-CM | POA: Diagnosis not present

## 2022-08-23 DIAGNOSIS — R7303 Prediabetes: Secondary | ICD-10-CM | POA: Diagnosis not present

## 2022-08-23 DIAGNOSIS — M5137 Other intervertebral disc degeneration, lumbosacral region: Secondary | ICD-10-CM | POA: Diagnosis not present

## 2022-08-23 DIAGNOSIS — I110 Hypertensive heart disease with heart failure: Secondary | ICD-10-CM | POA: Diagnosis not present

## 2022-08-23 DIAGNOSIS — H409 Unspecified glaucoma: Secondary | ICD-10-CM | POA: Diagnosis not present

## 2022-08-23 DIAGNOSIS — G43909 Migraine, unspecified, not intractable, without status migrainosus: Secondary | ICD-10-CM | POA: Diagnosis not present

## 2022-08-23 DIAGNOSIS — N179 Acute kidney failure, unspecified: Secondary | ICD-10-CM | POA: Diagnosis not present

## 2022-08-23 DIAGNOSIS — G4700 Insomnia, unspecified: Secondary | ICD-10-CM | POA: Diagnosis not present

## 2022-08-23 DIAGNOSIS — I272 Pulmonary hypertension, unspecified: Secondary | ICD-10-CM | POA: Diagnosis not present

## 2022-08-23 DIAGNOSIS — J440 Chronic obstructive pulmonary disease with acute lower respiratory infection: Secondary | ICD-10-CM | POA: Diagnosis not present

## 2022-08-23 DIAGNOSIS — Z7982 Long term (current) use of aspirin: Secondary | ICD-10-CM | POA: Diagnosis not present

## 2022-08-23 DIAGNOSIS — I2510 Atherosclerotic heart disease of native coronary artery without angina pectoris: Secondary | ICD-10-CM | POA: Diagnosis not present

## 2022-08-23 DIAGNOSIS — N390 Urinary tract infection, site not specified: Secondary | ICD-10-CM | POA: Diagnosis not present

## 2022-08-23 DIAGNOSIS — J189 Pneumonia, unspecified organism: Secondary | ICD-10-CM | POA: Diagnosis not present

## 2022-08-23 DIAGNOSIS — Z9981 Dependence on supplemental oxygen: Secondary | ICD-10-CM | POA: Diagnosis not present

## 2022-08-23 DIAGNOSIS — I480 Paroxysmal atrial fibrillation: Secondary | ICD-10-CM | POA: Diagnosis not present

## 2022-08-23 DIAGNOSIS — E785 Hyperlipidemia, unspecified: Secondary | ICD-10-CM | POA: Diagnosis not present

## 2022-08-23 DIAGNOSIS — M1990 Unspecified osteoarthritis, unspecified site: Secondary | ICD-10-CM | POA: Diagnosis not present

## 2022-08-23 DIAGNOSIS — D649 Anemia, unspecified: Secondary | ICD-10-CM | POA: Diagnosis not present

## 2022-08-23 DIAGNOSIS — F0280 Dementia in other diseases classified elsewhere without behavioral disturbance: Secondary | ICD-10-CM | POA: Diagnosis not present

## 2022-08-23 DIAGNOSIS — J432 Centrilobular emphysema: Secondary | ICD-10-CM | POA: Diagnosis not present

## 2022-08-23 DIAGNOSIS — I5043 Acute on chronic combined systolic (congestive) and diastolic (congestive) heart failure: Secondary | ICD-10-CM | POA: Diagnosis not present

## 2022-08-23 DIAGNOSIS — J441 Chronic obstructive pulmonary disease with (acute) exacerbation: Secondary | ICD-10-CM | POA: Diagnosis not present

## 2022-08-27 DIAGNOSIS — M1990 Unspecified osteoarthritis, unspecified site: Secondary | ICD-10-CM | POA: Diagnosis not present

## 2022-08-27 DIAGNOSIS — I2510 Atherosclerotic heart disease of native coronary artery without angina pectoris: Secondary | ICD-10-CM | POA: Diagnosis not present

## 2022-08-27 DIAGNOSIS — G43909 Migraine, unspecified, not intractable, without status migrainosus: Secondary | ICD-10-CM | POA: Diagnosis not present

## 2022-08-27 DIAGNOSIS — G4700 Insomnia, unspecified: Secondary | ICD-10-CM | POA: Diagnosis not present

## 2022-08-27 DIAGNOSIS — Z7982 Long term (current) use of aspirin: Secondary | ICD-10-CM | POA: Diagnosis not present

## 2022-08-27 DIAGNOSIS — E785 Hyperlipidemia, unspecified: Secondary | ICD-10-CM | POA: Diagnosis not present

## 2022-08-27 DIAGNOSIS — E039 Hypothyroidism, unspecified: Secondary | ICD-10-CM | POA: Diagnosis not present

## 2022-08-27 DIAGNOSIS — J440 Chronic obstructive pulmonary disease with acute lower respiratory infection: Secondary | ICD-10-CM | POA: Diagnosis not present

## 2022-08-27 DIAGNOSIS — F0280 Dementia in other diseases classified elsewhere without behavioral disturbance: Secondary | ICD-10-CM | POA: Diagnosis not present

## 2022-08-27 DIAGNOSIS — I110 Hypertensive heart disease with heart failure: Secondary | ICD-10-CM | POA: Diagnosis not present

## 2022-08-27 DIAGNOSIS — I480 Paroxysmal atrial fibrillation: Secondary | ICD-10-CM | POA: Diagnosis not present

## 2022-08-27 DIAGNOSIS — H409 Unspecified glaucoma: Secondary | ICD-10-CM | POA: Diagnosis not present

## 2022-08-27 DIAGNOSIS — D649 Anemia, unspecified: Secondary | ICD-10-CM | POA: Diagnosis not present

## 2022-08-27 DIAGNOSIS — I272 Pulmonary hypertension, unspecified: Secondary | ICD-10-CM | POA: Diagnosis not present

## 2022-08-27 DIAGNOSIS — Z9981 Dependence on supplemental oxygen: Secondary | ICD-10-CM | POA: Diagnosis not present

## 2022-08-27 DIAGNOSIS — K219 Gastro-esophageal reflux disease without esophagitis: Secondary | ICD-10-CM | POA: Diagnosis not present

## 2022-08-27 DIAGNOSIS — N390 Urinary tract infection, site not specified: Secondary | ICD-10-CM | POA: Diagnosis not present

## 2022-08-27 DIAGNOSIS — I5043 Acute on chronic combined systolic (congestive) and diastolic (congestive) heart failure: Secondary | ICD-10-CM | POA: Diagnosis not present

## 2022-08-27 DIAGNOSIS — N179 Acute kidney failure, unspecified: Secondary | ICD-10-CM | POA: Diagnosis not present

## 2022-08-27 DIAGNOSIS — J432 Centrilobular emphysema: Secondary | ICD-10-CM | POA: Diagnosis not present

## 2022-08-27 DIAGNOSIS — R7303 Prediabetes: Secondary | ICD-10-CM | POA: Diagnosis not present

## 2022-08-27 DIAGNOSIS — J189 Pneumonia, unspecified organism: Secondary | ICD-10-CM | POA: Diagnosis not present

## 2022-08-27 DIAGNOSIS — J441 Chronic obstructive pulmonary disease with (acute) exacerbation: Secondary | ICD-10-CM | POA: Diagnosis not present

## 2022-08-27 DIAGNOSIS — M5137 Other intervertebral disc degeneration, lumbosacral region: Secondary | ICD-10-CM | POA: Diagnosis not present

## 2022-09-07 ENCOUNTER — Telehealth: Payer: Self-pay | Admitting: Family Medicine

## 2022-09-07 ENCOUNTER — Encounter: Payer: Self-pay | Admitting: Family Medicine

## 2022-09-07 NOTE — Telephone Encounter (Signed)
Patient daughter [name] called in and had some questions regarding long term healthcare for Vanessa [name]. She can be reached at [phone]. Thank you!

## 2022-09-07 NOTE — Telephone Encounter (Signed)
Replied to mychart message.

## 2022-09-12 DIAGNOSIS — Z8709 Personal history of other diseases of the respiratory system: Secondary | ICD-10-CM | POA: Diagnosis not present

## 2022-09-12 DIAGNOSIS — J9601 Acute respiratory failure with hypoxia: Secondary | ICD-10-CM | POA: Diagnosis not present

## 2022-09-12 DIAGNOSIS — J441 Chronic obstructive pulmonary disease with (acute) exacerbation: Secondary | ICD-10-CM | POA: Diagnosis not present

## 2022-09-12 DIAGNOSIS — J449 Chronic obstructive pulmonary disease, unspecified: Secondary | ICD-10-CM | POA: Diagnosis not present

## 2022-09-12 DIAGNOSIS — I5042 Chronic combined systolic (congestive) and diastolic (congestive) heart failure: Secondary | ICD-10-CM | POA: Diagnosis not present

## 2022-09-12 DIAGNOSIS — R42 Dizziness and giddiness: Secondary | ICD-10-CM | POA: Diagnosis not present

## 2022-09-12 DIAGNOSIS — Z87898 Personal history of other specified conditions: Secondary | ICD-10-CM | POA: Diagnosis not present

## 2022-09-12 DIAGNOSIS — E876 Hypokalemia: Secondary | ICD-10-CM | POA: Diagnosis not present

## 2022-10-13 DIAGNOSIS — J9601 Acute respiratory failure with hypoxia: Secondary | ICD-10-CM | POA: Diagnosis not present

## 2022-10-13 DIAGNOSIS — J441 Chronic obstructive pulmonary disease with (acute) exacerbation: Secondary | ICD-10-CM | POA: Diagnosis not present

## 2022-10-18 ENCOUNTER — Ambulatory Visit: Payer: Self-pay

## 2022-10-18 ENCOUNTER — Ambulatory Visit
Admission: EM | Admit: 2022-10-18 | Discharge: 2022-10-18 | Disposition: A | Discharge status: Home / Self Care | Payer: PPO | Attending: Urgent Care | Admitting: Urgent Care

## 2022-10-18 DIAGNOSIS — N3001 Acute cystitis with hematuria: Secondary | ICD-10-CM | POA: Diagnosis not present

## 2022-10-18 LAB — POCT URINALYSIS DIP (MANUAL ENTRY)
Bilirubin, UA: NEGATIVE
Glucose, UA: NEGATIVE mg/dL
Ketones, POC UA: NEGATIVE mg/dL
Nitrite, UA: NEGATIVE
Protein Ur, POC: NEGATIVE mg/dL
Spec Grav, UA: 1.02 (ref 1.010–1.025)
Urobilinogen, UA: 1 E.U./dL
pH, UA: 6.5 (ref 5.0–8.0)

## 2022-10-18 MED ORDER — CEPHALEXIN 500 MG PO CAPS
500.0000 mg | ORAL_CAPSULE | Freq: Four times a day (QID) | ORAL | 0 refills | Status: AC
Start: 2022-10-18 — End: 2022-10-23

## 2022-10-18 NOTE — Discharge Instructions (Signed)
Follow up here or with your primary care provider if your symptoms are worsening or not improving with treatment.     

## 2022-10-18 NOTE — ED Provider Notes (Signed)
Renaldo Fiddler    CSN: [id number] Arrival date & time: 10/18/22  1342      History   Chief Complaint Chief Complaint  Patient presents with   Urinary Frequency    HPI DANENE MONTIJO is a 88 y.o. female.    Urinary Frequency    Presents to urgent care with concern for urinary frequency x 2 days.  PMH includes AKA, systolic/diastolic heart failure, DM 2, atrial fibrillation, COPD, ARF, AKI.  09/12/2022 initial consult with cardiology.  ARF was found to be stable and patient would follow-up with pulmonology.  Daily Lasix was continued at 20 mg once daily  Past Medical History:  Diagnosis Date   Anemia    treated ~2009 with iron, resolved   Arthritis    Cataract    surgery on R catarct 2012   Chronic systolic CHF (congestive heart failure) (HCC)    a. TTE 2012: EF of 45-50%, mild diffuse HK, trivial AI, mild MR, mildly dilated RV with mild reduction of RVSF, mild to moderate TR, mild to moderate PR, mildly elevated PASP   COPD (chronic obstructive pulmonary disease) (HCC)    Family history of adverse reaction to anesthesia    Mother - altered mental status (long term)   Glaucoma    History of pelvic fracture    Hyperglycemia    mild inc in fasting sugar   Hyperlipidemia    Hypothyroidism    Insomnia    Migraines    without aura, sx since age 37   Osteopenia    prev with 10 years of fosamax and intolerant of evista   PAF (paroxysmal atrial fibrillation) (HCC)    a. initially noted 2012; b. CHADS2VASc at least 5 (CHF, age x 2, vasacular disease, female)   Pulmonary hypertension (HCC)    Wears dentures    full upper and lower    Patient Active Problem List   Diagnosis Date Noted   Diabetes mellitus without complication (HCC) 08/12/2022   Acute respiratory failure with hypoxia (HCC) 08/03/2022   COPD with acute exacerbation (HCC) 08/03/2022   Hypokalemia 08/03/2022   Syncope 08/03/2022   Dementia (HCC) 08/03/2022   Prolonged QT interval 08/03/2022    AKI (acute kidney injury) (HCC) 08/03/2022   Chronic combined systolic and diastolic CHF (congestive heart failure) (HCC) 08/03/2022   Dysuria 09/24/2021   Dizziness of unknown cause 09/21/2021   B12 deficiency 03/22/2019   Memory change 03/18/2019   Pelvic fracture (HCC) 10/05/2018   Abnormal weight loss 08/19/2017   Hypotension 01/18/2016   Fatigue 07/12/2015   Hyperglycemia 05/25/2014   OA (osteoarthritis) 05/25/2014   Adjustment disorder 01/07/2014   Environmental allergies 12/13/2013   Arthritis 06/27/2013   Carotid stenosis 11/04/2012   Migraine 11/12/2011   Medicare annual wellness visit, subsequent 05/15/2011   Osteopenia 01/25/2011   Hypothyroid 01/25/2011   Advance directive discussed with patient 01/24/2011   Atrial fibrillation (HCC) 08/17/2010   Sick sinus syndrome (HCC) 08/17/2010   Hyperlipidemia 08/17/2010    Past Surgical History:  Procedure Laterality Date   CATARACT EXTRACTION     CATARACT EXTRACTION W/PHACO Left 08/27/2016   Procedure: CATARACT EXTRACTION PHACO AND INTRAOCULAR LENS PLACEMENT (IOC)  Left;  Surgeon: Lockie Mola, MD;  Location: [facility];  Service: Ophthalmology;  Laterality: Left;   COLONOSCOPY  2012   REFRACTIVE SURGERY      OB History   No obstetric history on file.      Home Medications    Prior to Admission  medications   Medication Sig Start Date End Date Taking? Authorizing Provider  Apoaequorin (PREVAGEN EXTRA STRENGTH) 20 MG CAPS Take 20 mg by mouth in the morning.    [provider]  Cholecalciferol (VITAMIN D) 50 MCG (2000 UT) CAPS Take 1 capsule (2,000 Units total) by mouth daily. 10/03/18   Joaquim Nam, MD  Cranberry 1000 MG CAPS Take by mouth daily.    [provider]  flecainide (TAMBOCOR) 50 MG tablet Take 1 tablet (50 mg total) by mouth 2 (two) times daily. 07/26/22   Duke Salvia, MD  furosemide (LASIX) 20 MG tablet Take 1 tablet (20 mg total) by mouth daily. 08/05/22 09/04/22   Baldwin Jamaica, MD  latanoprost (XALATAN) 0.005 % ophthalmic solution Place 1 drop into both eyes at bedtime.    [provider]  levothyroxine (SYNTHROID) 25 MCG tablet Take 1 tablet (25 mcg total) by mouth daily with breakfast. 05/08/22   Joaquim Nam, MD  Potassium 99 MG TABS Take 2 tablets (198 mg total) by mouth daily. 08/10/22   Joaquim Nam, MD    Family History Family History  Problem Relation Age of Onset   Diabetes Father    Migraines Mother    Stroke Mother        TIAs   Breast cancer Neg Hx    Colon cancer Neg Hx     Social History Social History   Tobacco Use   Smoking status: Former    Packs/day: 0.25    Years: 50.00    Additional pack years: 0.00    Total pack years: 12.50    Types: Cigarettes   Smokeless tobacco: Never   Tobacco comments:    started smoking in 1958- stopped 2020 after a fall  Vaping Use   Vaping Use: Never used  Substance Use Topics   Alcohol use: No   Drug use: No     Allergies   Codeine, Cortisone, Decongestant [pseudoephedrine hcl], Naproxen, and Simvastatin   Review of Systems Review of Systems  Genitourinary:  Positive for frequency.     Physical Exam Triage Vital Signs ED Triage Vitals [10/18/22 1424]  Enc Vitals Group     BP      Pulse      Resp      Temp      Temp src      SpO2      Weight      Height      Head Circumference      Peak Flow      Pain Score 0     Pain Loc      Pain Edu?      Excl. in GC?    No data found.  Updated Vital Signs There were no vitals taken for this visit.  Visual Acuity Right Eye Distance:   Left Eye Distance:   Bilateral Distance:    Right Eye Near:   Left Eye Near:    Bilateral Near:     Physical Exam Vitals reviewed.  Constitutional:      Appearance: Normal appearance.  Skin:    General: Skin is warm and dry.  Neurological:     General: No focal deficit present.     Mental Status: She is alert and oriented to person, place, and time.   Psychiatric:        Mood and Affect: Mood normal.        Behavior: Behavior normal.      UC Treatments /  Results  Labs (all labs ordered are listed, but only abnormal results are displayed) Labs Reviewed  POCT URINALYSIS DIP (MANUAL ENTRY) - Abnormal; Notable for the following components:      Result Value   Clarity, UA cloudy (*)    Blood, UA trace-intact (*)    Leukocytes, UA Small (1+) (*)    All other components within normal limits    EKG   Radiology No results found.  Procedures Procedures (including critical care time)  Medications Ordered in UC Medications - No data to display  Initial Impression / Assessment and Plan / UC Course  I have reviewed the triage vital signs and the nursing notes.  Pertinent labs & imaging results that were available during my care of the patient were reviewed by me and considered in my medical decision making (see chart for details).   UA result is suggestive of urinary tract infection.  Small leukocytes are present.  Urine is cloudy with trace blood present.  Will treat patient with cephalexin per protocol.  Urine culture will be sent to confirm susceptibility.  Counseled patient on potential for adverse effects with medications prescribed/recommended today, ER and return-to-clinic precautions discussed, patient verbalized understanding and agreement with care plan.  Final Clinical Impressions(s) / UC Diagnoses   Final diagnoses:  None   Discharge Instructions   None    ED Prescriptions   None    PDMP not reviewed this encounter.   Charma Igo, [place] 10/18/22 1436

## 2022-10-18 NOTE — ED Triage Notes (Signed)
Patient presents to [facility] for urinary freq x 2 days. Hx of UTIs. No OTC meds.

## 2022-10-19 DIAGNOSIS — Z87898 Personal history of other specified conditions: Secondary | ICD-10-CM | POA: Diagnosis not present

## 2022-10-19 DIAGNOSIS — I5042 Chronic combined systolic (congestive) and diastolic (congestive) heart failure: Secondary | ICD-10-CM | POA: Diagnosis not present

## 2022-10-19 DIAGNOSIS — R42 Dizziness and giddiness: Secondary | ICD-10-CM | POA: Diagnosis not present

## 2022-10-19 DIAGNOSIS — J449 Chronic obstructive pulmonary disease, unspecified: Secondary | ICD-10-CM | POA: Diagnosis not present

## 2022-10-19 DIAGNOSIS — Z8709 Personal history of other diseases of the respiratory system: Secondary | ICD-10-CM | POA: Diagnosis not present

## 2022-10-19 DIAGNOSIS — I471 Supraventricular tachycardia, unspecified: Secondary | ICD-10-CM | POA: Diagnosis not present

## 2022-10-19 DIAGNOSIS — R0602 Shortness of breath: Secondary | ICD-10-CM | POA: Diagnosis not present

## 2022-10-20 LAB — URINE CULTURE: Culture: 30000 — AB

## 2022-10-21 LAB — URINE CULTURE

## 2022-10-31 DIAGNOSIS — I5042 Chronic combined systolic (congestive) and diastolic (congestive) heart failure: Secondary | ICD-10-CM | POA: Diagnosis not present

## 2022-10-31 DIAGNOSIS — R0602 Shortness of breath: Secondary | ICD-10-CM | POA: Diagnosis not present

## 2022-11-01 ENCOUNTER — Other Ambulatory Visit: Payer: Self-pay | Admitting: Family Medicine

## 2022-11-07 ENCOUNTER — Ambulatory Visit
Admission: RE | Admit: 2022-11-07 | Discharge: 2022-11-07 | Disposition: A | Discharge status: Home / Self Care | Payer: PPO | Source: Ambulatory Visit | Attending: Emergency Medicine | Admitting: Emergency Medicine

## 2022-11-07 VITALS — BP 107/70 | HR 61 | Temp 98.8°F | Resp 18 | Wt 115.0 lb

## 2022-11-07 DIAGNOSIS — R350 Frequency of micturition: Secondary | ICD-10-CM

## 2022-11-07 DIAGNOSIS — N3001 Acute cystitis with hematuria: Secondary | ICD-10-CM

## 2022-11-07 LAB — POCT URINALYSIS DIP (MANUAL ENTRY)
Bilirubin, UA: NEGATIVE
Glucose, UA: NEGATIVE mg/dL
Ketones, POC UA: NEGATIVE mg/dL
Nitrite, UA: NEGATIVE
Protein Ur, POC: 100 mg/dL — AB
Spec Grav, UA: 1.02 (ref 1.010–1.025)
Urobilinogen, UA: 0.2 E.U./dL
pH, UA: 6.5 (ref 5.0–8.0)

## 2022-11-07 MED ORDER — CIPROFLOXACIN HCL 500 MG PO TABS: 500.0000 mg | ORAL_TABLET | Freq: Two times a day (BID) | ORAL | 0 refills | Status: AC

## 2022-11-07 NOTE — ED Provider Notes (Signed)
Renaldo Fiddler    CSN: [id number] Arrival date & time: 11/07/22  1333      History   Chief Complaint Chief Complaint  Patient presents with   Urinary Frequency    HPI Vanessa [name] is a 88 y.o. female.  Accompanied by her daughter, patient presents with urinary frequency, dysuria, bladder pressure, low back pain x 3 days.  Her daughter reports she has been slightly more confused than usual this morning.  No fever, abdominal pain, or other symptoms.  Patient was seen at this urgent care on 10/18/2022; diagnosed with acute cystitis with hematuria; treated with cephalexin; urine culture had 30,000 colonies of E. coli.  The history is provided by a relative, the patient and medical records.    Past Medical History:  Diagnosis Date   Anemia    treated ~2009 with iron, resolved   Arthritis    Cataract    surgery on R catarct 2012   Chronic systolic CHF (congestive heart failure) (HCC)    a. TTE 2012: EF of 45-50%, mild diffuse HK, trivial AI, mild MR, mildly dilated RV with mild reduction of RVSF, mild to moderate TR, mild to moderate PR, mildly elevated PASP   COPD (chronic obstructive pulmonary disease) (HCC)    Family history of adverse reaction to anesthesia    Mother - altered mental status (long term)   Glaucoma    History of pelvic fracture    Hyperglycemia    mild inc in fasting sugar   Hyperlipidemia    Hypothyroidism    Insomnia    Migraines    without aura, sx since age 45   Osteopenia    prev with 10 years of fosamax and intolerant of evista   PAF (paroxysmal atrial fibrillation) (HCC)    a. initially noted 2012; b. CHADS2VASc at least 5 (CHF, age x 2, vasacular disease, female)   Pulmonary hypertension (HCC)    Wears dentures    full upper and lower    Patient Active Problem List   Diagnosis Date Noted   Diabetes mellitus without complication (HCC) 08/12/2022   Acute respiratory failure with hypoxia (HCC) 08/03/2022   COPD with acute  exacerbation (HCC) 08/03/2022   Hypokalemia 08/03/2022   Syncope 08/03/2022   Dementia (HCC) 08/03/2022   Prolonged QT interval 08/03/2022   AKI (acute kidney injury) (HCC) 08/03/2022   Chronic combined systolic and diastolic CHF (congestive heart failure) (HCC) 08/03/2022   Dysuria 09/24/2021   Dizziness of unknown cause 09/21/2021   B12 deficiency 03/22/2019   Memory change 03/18/2019   Pelvic fracture (HCC) 10/05/2018   Abnormal weight loss 08/19/2017   Hypotension 01/18/2016   Fatigue 07/12/2015   Hyperglycemia 05/25/2014   OA (osteoarthritis) 05/25/2014   Adjustment disorder 01/07/2014   Environmental allergies 12/13/2013   Arthritis 06/27/2013   Carotid stenosis 11/04/2012   Migraine 11/12/2011   Medicare annual wellness visit, subsequent 05/15/2011   Osteopenia 01/25/2011   Hypothyroid 01/25/2011   Advance directive discussed with patient 01/24/2011   Atrial fibrillation (HCC) 08/17/2010   Sick sinus syndrome (HCC) 08/17/2010   Hyperlipidemia 08/17/2010    Past Surgical History:  Procedure Laterality Date   CATARACT EXTRACTION     CATARACT EXTRACTION W/PHACO Left 08/27/2016   Procedure: CATARACT EXTRACTION PHACO AND INTRAOCULAR LENS PLACEMENT (IOC)  Left;  Surgeon: Lockie Mola, MD;  Location: [facility];  Service: Ophthalmology;  Laterality: Left;   COLONOSCOPY  2012   REFRACTIVE SURGERY      OB History  No obstetric history on file.      Home Medications    Prior to Admission medications   Medication Sig Start Date End Date Taking? Authorizing Provider  Apoaequorin (PREVAGEN EXTRA STRENGTH) 20 MG CAPS Take 20 mg by mouth in the morning.   Yes [provider]  aspirin EC 81 MG tablet Take by mouth.   Yes [provider]  Cholecalciferol (VITAMIN D) 50 MCG (2000 UT) CAPS Take 1 capsule (2,000 Units total) by mouth daily. 10/03/18  Yes Joaquim Nam, MD  ciprofloxacin (CIPRO) 500 MG tablet Take 1 tablet (500 mg total) by  mouth 2 (two) times daily for 5 days. 11/07/22 11/12/22 Yes Mickie Bail, NP  Cranberry 1000 MG CAPS Take by mouth daily.   Yes [provider]  furosemide (LASIX) 20 MG tablet Take 1 tablet (20 mg total) by mouth daily. 08/05/22 11/07/22 Yes Masoud, Dahlia Client, MD  latanoprost (XALATAN) 0.005 % ophthalmic solution Place 1 drop into both eyes at bedtime.   Yes [provider]  levothyroxine (SYNTHROID) 25 MCG tablet TAKE 1 TABLET(25 MCG) BY MOUTH DAILY WITH BREAKFAST 11/02/22  Yes Joaquim Nam, MD  Potassium 99 MG TABS Take 2 tablets (198 mg total) by mouth daily. 08/10/22  Yes Joaquim Nam, MD  carvedilol (COREG) 3.125 MG tablet Take by mouth. 09/12/22 09/12/23  [provider]  donepezil (ARICEPT) 10 MG tablet Take 10 mg by mouth daily. 09/05/22   [provider]  flecainide (TAMBOCOR) 50 MG tablet Take 1 tablet (50 mg total) by mouth 2 (two) times daily. 07/26/22   Duke Salvia, MD  losartan (COZAAR) 25 MG tablet Take 1 tablet by mouth daily. 09/12/22 09/12/23  [provider]  potassium gluconate (RA POTASSIUM GLUCONATE) 595 (99 K) MG TABS tablet Take by mouth. 08/10/22   [provider]    Family History Family History  Problem Relation Age of Onset   Diabetes Father    Migraines Mother    Stroke Mother        TIAs   Breast cancer Neg Hx    Colon cancer Neg Hx     Social History Social History   Tobacco Use   Smoking status: Former    Current packs/day: 0.25    Average packs/day: 0.3 packs/day for 50.0 years (12.5 ttl pk-yrs)    Types: Cigarettes   Smokeless tobacco: Never   Tobacco comments:    started smoking in 1958- stopped 2020 after a fall  Vaping Use   Vaping status: Never Used  Substance Use Topics   Alcohol use: No   Drug use: No     Allergies   Codeine, Cortisone, Decongestant [pseudoephedrine hcl], Naproxen, and Simvastatin   Review of Systems Review of Systems  Constitutional:  Negative for chills and  fever.  Gastrointestinal:  Negative for abdominal pain, nausea and vomiting.  Genitourinary:  Positive for dysuria and frequency. Negative for pelvic pain.  Musculoskeletal:  Positive for back pain.     Physical Exam Triage Vital Signs ED Triage Vitals  Encounter Vitals Group     BP      Systolic BP Percentile      Diastolic BP Percentile      Pulse      Resp      Temp      Temp src      SpO2      Weight      Height      Head Circumference  Peak Flow      Pain Score      Pain Loc      Pain Education      Exclude from Growth Chart    No data found.  Updated Vital Signs BP 107/70 (BP Location: Right Arm)   Pulse 61   Temp 98.8 F (37.1 C) (Oral)   Resp 18   Wt 115 lb (52.2 kg)   SpO2 95%   BMI 19.74 kg/m   Visual Acuity Right Eye Distance:   Left Eye Distance:   Bilateral Distance:    Right Eye Near:   Left Eye Near:    Bilateral Near:     Physical Exam Vitals and nursing note reviewed.  Constitutional:      General: She is not in acute distress.    Appearance: She is well-developed.  HENT:     Mouth/Throat:     Mouth: Mucous membranes are moist.  Cardiovascular:     Rate and Rhythm: Normal rate and regular rhythm.     Heart sounds: Normal heart sounds.  Pulmonary:     Effort: Pulmonary effort is normal. No respiratory distress.     Breath sounds: Normal breath sounds.  Abdominal:     General: Bowel sounds are normal.     Palpations: Abdomen is soft.     Tenderness: There is no abdominal tenderness. There is no right CVA tenderness, left CVA tenderness, guarding or rebound.  Musculoskeletal:     Cervical back: Neck supple.  Skin:    General: Skin is warm and dry.  Neurological:     Mental Status: She is alert.  Psychiatric:        Mood and Affect: Mood normal.        Behavior: Behavior normal.      UC Treatments / Results  Labs (all labs ordered are listed, but only abnormal results are displayed) Labs Reviewed  POCT URINALYSIS  DIP (MANUAL ENTRY) - Abnormal; Notable for the following components:      Result Value   Color, UA light yellow (*)    Clarity, UA cloudy (*)    Blood, UA large (*)    Protein Ur, POC =100 (*)    Leukocytes, UA Large (3+) (*)    All other components within normal limits  URINE CULTURE    EKG   Radiology No results found.  Procedures Procedures (including critical care time)  Medications Ordered in UC Medications - No data to display  Initial Impression / Assessment and Plan / UC Course  I have reviewed the triage vital signs and the nursing notes.  Pertinent labs & imaging results that were available during my care of the patient were reviewed by me and considered in my medical decision making (see chart for details).   Acute cystitis with hematuria, urinary frequency.  Patient just completed cephalexin.  Her current symptoms started 3 days ago and are getting worse.  Treating today with Cipro.  Urine culture pending.  Instructed her and her daughter to follow-up with her PCP for recheck as she has had recurrent symptoms.  Education provided on UTI.  They agree to plan of care.   Final Clinical Impressions(s) / UC Diagnoses   Final diagnoses:  Urinary frequency  Acute cystitis with hematuria     Discharge Instructions      Take the antibiotic as directed.  The urine culture is pending.  We will call you if it shows the need to change or discontinue your antibiotic.  Follow up with your primary care provider.        ED Prescriptions     Medication Sig Dispense Auth. Provider   ciprofloxacin (CIPRO) 500 MG tablet Take 1 tablet (500 mg total) by mouth 2 (two) times daily for 5 days. 10 tablet Mickie Bail, NP      PDMP not reviewed this encounter.   Mickie Bail, NP 11/07/22 1416

## 2022-11-07 NOTE — Discharge Instructions (Addendum)
Take the antibiotic as directed.  The urine culture is pending.  We will call you if it shows the need to change or discontinue your antibiotic.    Follow up with your primary care provider.    

## 2022-11-07 NOTE — ED Triage Notes (Signed)
diagnosed with UTI on 10/18/22  urgency has returned - Entered by patient

## 2022-11-09 ENCOUNTER — Encounter: Payer: Self-pay | Admitting: Family Medicine

## 2022-11-09 LAB — URINE CULTURE: Culture: 80000 — AB

## 2022-11-12 DIAGNOSIS — J441 Chronic obstructive pulmonary disease with (acute) exacerbation: Secondary | ICD-10-CM | POA: Diagnosis not present

## 2022-11-12 DIAGNOSIS — J9601 Acute respiratory failure with hypoxia: Secondary | ICD-10-CM | POA: Diagnosis not present

## 2022-11-26 ENCOUNTER — Encounter: Payer: Self-pay | Admitting: Family Medicine

## 2022-11-26 ENCOUNTER — Ambulatory Visit (INDEPENDENT_AMBULATORY_CARE_PROVIDER_SITE_OTHER): Payer: PPO | Admitting: Family Medicine

## 2022-11-26 VITALS — BP 130/72 | HR 80 | Temp 98.0°F | Ht 64.0 in | Wt 120.0 lb

## 2022-11-26 DIAGNOSIS — Z87440 Personal history of urinary (tract) infections: Secondary | ICD-10-CM

## 2022-11-26 DIAGNOSIS — R0602 Shortness of breath: Secondary | ICD-10-CM

## 2022-11-26 DIAGNOSIS — I5042 Chronic combined systolic (congestive) and diastolic (congestive) heart failure: Secondary | ICD-10-CM

## 2022-11-26 LAB — URINALYSIS, ROUTINE W REFLEX MICROSCOPIC
Bilirubin Urine: NEGATIVE
Hgb urine dipstick: NEGATIVE
Ketones, ur: NEGATIVE
Leukocytes,Ua: NEGATIVE
Nitrite: NEGATIVE
RBC / HPF: NONE SEEN (ref 0–?)
Specific Gravity, Urine: <=1.005 — AB (ref 1.000–1.030)
Total Protein, Urine: NEGATIVE
Urine Glucose: NEGATIVE
Urobilinogen, UA: 0.2 (ref 0.0–1.0)
WBC, UA: NONE SEEN (ref 0–?)
pH: 6 (ref 5.0–8.0)

## 2022-11-26 MED ORDER — FUROSEMIDE 20 MG PO TABS: ORAL_TABLET | ORAL | Status: DC

## 2022-11-26 NOTE — Patient Instructions (Signed)
Go to the lab on the way out.   If you have mychart we'll likely use that to update you.     Usually take 1 lasix a day.   I would take 2 tabs of lasix if your weight is above 118 lbs.   If weight isn't coming down after 2 days of double dose then let me know.

## 2022-11-26 NOTE — Progress Notes (Unsigned)
H/o UTI.  Previous culture with E coli sens to cipro.  Done with abx.  No dysuria.  She didn't have pain prev but previously she had urinary frequency.  No frequency now.    She had lower BP and had to stop carvedilol and losartan.  Med list updated.  Still taking lasix 20mg  a day.  Family noted some occ wheeze.  No fevers or chills.  Family noted some occ SOBOE.  Discussed occ prn inc dose of lasix.    Discussed colonization vs UTI considerations.  She had h/o prolonged QT and discussed not restarting aricept.  Discussed possible namenda use.    Typical weight is ~115 lbs.    Meds, vitals, and allergies reviewed.   ROS: Per HPI unless specifically indicated in ROS section   Nad Ncat Neck supple, no LA Trace BLE edema.  Faint dec in BS at the B bases.  RRR Skin well-perfused.  30 minutes were devoted to patient care in this encounter (this includes time spent reviewing the patient's file/history, interviewing and examining the patient, counseling/reviewing plan with patient).

## 2022-11-27 DIAGNOSIS — R0602 Shortness of breath: Secondary | ICD-10-CM | POA: Diagnosis not present

## 2022-11-28 DIAGNOSIS — Z87440 Personal history of urinary (tract) infections: Secondary | ICD-10-CM | POA: Insufficient documentation

## 2022-11-28 NOTE — Assessment & Plan Note (Signed)
Discussed goal dry weight of 118 pounds. Under normal circumstances, would take 20mg  lasix a day.   I would take 40 mg of lasix if AM weight is above 118 lbs.   If weight isn't coming down after 2 days of double dose then contact clinic.  See notes on labs.

## 2022-11-28 NOTE — Assessment & Plan Note (Signed)
Discussed colonization vs UTI considerations. See notes on labs.  She had dysuria with urinary frequency at time of infection but not now.

## 2022-12-13 DIAGNOSIS — J9601 Acute respiratory failure with hypoxia: Secondary | ICD-10-CM | POA: Diagnosis not present

## 2022-12-13 DIAGNOSIS — J441 Chronic obstructive pulmonary disease with (acute) exacerbation: Secondary | ICD-10-CM | POA: Diagnosis not present

## 2022-12-31 DIAGNOSIS — Z87898 Personal history of other specified conditions: Secondary | ICD-10-CM | POA: Diagnosis not present

## 2022-12-31 DIAGNOSIS — R42 Dizziness and giddiness: Secondary | ICD-10-CM | POA: Diagnosis not present

## 2022-12-31 DIAGNOSIS — J449 Chronic obstructive pulmonary disease, unspecified: Secondary | ICD-10-CM | POA: Diagnosis not present

## 2022-12-31 DIAGNOSIS — I471 Supraventricular tachycardia, unspecified: Secondary | ICD-10-CM | POA: Diagnosis not present

## 2022-12-31 DIAGNOSIS — I5042 Chronic combined systolic (congestive) and diastolic (congestive) heart failure: Secondary | ICD-10-CM | POA: Diagnosis not present

## 2022-12-31 DIAGNOSIS — R0602 Shortness of breath: Secondary | ICD-10-CM | POA: Diagnosis not present

## 2023-01-04 ENCOUNTER — Other Ambulatory Visit: Payer: Self-pay | Admitting: Family Medicine

## 2023-01-08 ENCOUNTER — Encounter: Payer: Self-pay | Admitting: Family Medicine

## 2023-01-09 ENCOUNTER — Other Ambulatory Visit: Payer: Self-pay | Admitting: Family Medicine

## 2023-01-09 DIAGNOSIS — R5381 Other malaise: Secondary | ICD-10-CM

## 2023-01-17 ENCOUNTER — Encounter: Payer: Self-pay | Admitting: Family Medicine

## 2023-02-07 ENCOUNTER — Inpatient Hospital Stay
Admission: EM | Admit: 2023-02-07 | Discharge: 2023-02-15 | DRG: 853 | Disposition: A | Discharge status: Skilled Nursing Facility | Payer: PPO | Attending: Internal Medicine | Admitting: Internal Medicine

## 2023-02-07 ENCOUNTER — Other Ambulatory Visit: Payer: Self-pay

## 2023-02-07 ENCOUNTER — Inpatient Hospital Stay: Payer: PPO

## 2023-02-07 ENCOUNTER — Inpatient Hospital Stay: Payer: PPO | Admitting: General Practice

## 2023-02-07 ENCOUNTER — Emergency Department: Payer: PPO

## 2023-02-07 ENCOUNTER — Encounter
Admission: EM | Disposition: A | Discharge status: Skilled Nursing Facility | Payer: Self-pay | Source: Home / Self Care | Attending: Internal Medicine

## 2023-02-07 DIAGNOSIS — I110 Hypertensive heart disease with heart failure: Secondary | ICD-10-CM | POA: Diagnosis not present

## 2023-02-07 DIAGNOSIS — I6529 Occlusion and stenosis of unspecified carotid artery: Secondary | ICD-10-CM | POA: Diagnosis not present

## 2023-02-07 DIAGNOSIS — R4189 Other symptoms and signs involving cognitive functions and awareness: Secondary | ICD-10-CM | POA: Diagnosis not present

## 2023-02-07 DIAGNOSIS — E876 Hypokalemia: Secondary | ICD-10-CM | POA: Diagnosis present

## 2023-02-07 DIAGNOSIS — E1165 Type 2 diabetes mellitus with hyperglycemia: Secondary | ICD-10-CM | POA: Diagnosis not present

## 2023-02-07 DIAGNOSIS — F32A Depression, unspecified: Secondary | ICD-10-CM | POA: Diagnosis present

## 2023-02-07 DIAGNOSIS — I700 Atherosclerosis of aorta: Secondary | ICD-10-CM | POA: Diagnosis not present

## 2023-02-07 DIAGNOSIS — Z23 Encounter for immunization: Secondary | ICD-10-CM

## 2023-02-07 DIAGNOSIS — M25511 Pain in right shoulder: Secondary | ICD-10-CM | POA: Diagnosis not present

## 2023-02-07 DIAGNOSIS — S72141A Displaced intertrochanteric fracture of right femur, initial encounter for closed fracture: Principal | ICD-10-CM | POA: Diagnosis present

## 2023-02-07 DIAGNOSIS — E1151 Type 2 diabetes mellitus with diabetic peripheral angiopathy without gangrene: Secondary | ICD-10-CM | POA: Diagnosis not present

## 2023-02-07 DIAGNOSIS — F03A0 Unspecified dementia, mild, without behavioral disturbance, psychotic disturbance, mood disturbance, and anxiety: Secondary | ICD-10-CM | POA: Diagnosis not present

## 2023-02-07 DIAGNOSIS — Z1152 Encounter for screening for COVID-19: Secondary | ICD-10-CM | POA: Diagnosis not present

## 2023-02-07 DIAGNOSIS — Z7982 Long term (current) use of aspirin: Secondary | ICD-10-CM

## 2023-02-07 DIAGNOSIS — S72142D Displaced intertrochanteric fracture of left femur, subsequent encounter for closed fracture with routine healing: Secondary | ICD-10-CM | POA: Diagnosis not present

## 2023-02-07 DIAGNOSIS — I272 Pulmonary hypertension, unspecified: Secondary | ICD-10-CM | POA: Diagnosis not present

## 2023-02-07 DIAGNOSIS — F0390 Unspecified dementia without behavioral disturbance: Secondary | ICD-10-CM | POA: Diagnosis not present

## 2023-02-07 DIAGNOSIS — D62 Acute posthemorrhagic anemia: Secondary | ICD-10-CM | POA: Diagnosis not present

## 2023-02-07 DIAGNOSIS — J690 Pneumonitis due to inhalation of food and vomit: Secondary | ICD-10-CM | POA: Diagnosis present

## 2023-02-07 DIAGNOSIS — Z515 Encounter for palliative care: Secondary | ICD-10-CM | POA: Diagnosis not present

## 2023-02-07 DIAGNOSIS — I480 Paroxysmal atrial fibrillation: Secondary | ICD-10-CM | POA: Diagnosis not present

## 2023-02-07 DIAGNOSIS — S72121A Displaced fracture of lesser trochanter of right femur, initial encounter for closed fracture: Secondary | ICD-10-CM | POA: Diagnosis not present

## 2023-02-07 DIAGNOSIS — R579 Shock, unspecified: Secondary | ICD-10-CM

## 2023-02-07 DIAGNOSIS — J440 Chronic obstructive pulmonary disease with acute lower respiratory infection: Secondary | ICD-10-CM | POA: Diagnosis not present

## 2023-02-07 DIAGNOSIS — E872 Acidosis, unspecified: Secondary | ICD-10-CM | POA: Diagnosis present

## 2023-02-07 DIAGNOSIS — G43909 Migraine, unspecified, not intractable, without status migrainosus: Secondary | ICD-10-CM | POA: Diagnosis present

## 2023-02-07 DIAGNOSIS — J189 Pneumonia, unspecified organism: Secondary | ICD-10-CM | POA: Diagnosis not present

## 2023-02-07 DIAGNOSIS — I471 Supraventricular tachycardia, unspecified: Secondary | ICD-10-CM | POA: Diagnosis present

## 2023-02-07 DIAGNOSIS — Z833 Family history of diabetes mellitus: Secondary | ICD-10-CM

## 2023-02-07 DIAGNOSIS — I959 Hypotension, unspecified: Secondary | ICD-10-CM | POA: Diagnosis not present

## 2023-02-07 DIAGNOSIS — J439 Emphysema, unspecified: Secondary | ICD-10-CM | POA: Diagnosis present

## 2023-02-07 DIAGNOSIS — S72141D Displaced intertrochanteric fracture of right femur, subsequent encounter for closed fracture with routine healing: Secondary | ICD-10-CM | POA: Diagnosis not present

## 2023-02-07 DIAGNOSIS — A419 Sepsis, unspecified organism: Secondary | ICD-10-CM | POA: Diagnosis not present

## 2023-02-07 DIAGNOSIS — E119 Type 2 diabetes mellitus without complications: Secondary | ICD-10-CM | POA: Diagnosis not present

## 2023-02-07 DIAGNOSIS — R739 Hyperglycemia, unspecified: Secondary | ICD-10-CM | POA: Diagnosis not present

## 2023-02-07 DIAGNOSIS — R4182 Altered mental status, unspecified: Secondary | ICD-10-CM | POA: Diagnosis not present

## 2023-02-07 DIAGNOSIS — E440 Moderate protein-calorie malnutrition: Secondary | ICD-10-CM | POA: Diagnosis not present

## 2023-02-07 DIAGNOSIS — Y92017 Garden or yard in single-family (private) house as the place of occurrence of the external cause: Secondary | ICD-10-CM | POA: Diagnosis not present

## 2023-02-07 DIAGNOSIS — E039 Hypothyroidism, unspecified: Secondary | ICD-10-CM | POA: Diagnosis present

## 2023-02-07 DIAGNOSIS — Z9181 History of falling: Secondary | ICD-10-CM | POA: Diagnosis not present

## 2023-02-07 DIAGNOSIS — Z823 Family history of stroke: Secondary | ICD-10-CM

## 2023-02-07 DIAGNOSIS — G9341 Metabolic encephalopathy: Secondary | ICD-10-CM | POA: Diagnosis not present

## 2023-02-07 DIAGNOSIS — I517 Cardiomegaly: Secondary | ICD-10-CM | POA: Diagnosis not present

## 2023-02-07 DIAGNOSIS — I5030 Unspecified diastolic (congestive) heart failure: Secondary | ICD-10-CM | POA: Diagnosis not present

## 2023-02-07 DIAGNOSIS — M8580 Other specified disorders of bone density and structure, unspecified site: Secondary | ICD-10-CM | POA: Diagnosis not present

## 2023-02-07 DIAGNOSIS — N179 Acute kidney failure, unspecified: Secondary | ICD-10-CM | POA: Diagnosis not present

## 2023-02-07 DIAGNOSIS — R918 Other nonspecific abnormal finding of lung field: Secondary | ICD-10-CM | POA: Diagnosis not present

## 2023-02-07 DIAGNOSIS — R571 Hypovolemic shock: Secondary | ICD-10-CM | POA: Diagnosis not present

## 2023-02-07 DIAGNOSIS — S7291XA Unspecified fracture of right femur, initial encounter for closed fracture: Secondary | ICD-10-CM | POA: Diagnosis present

## 2023-02-07 DIAGNOSIS — R9431 Abnormal electrocardiogram [ECG] [EKG]: Secondary | ICD-10-CM | POA: Diagnosis not present

## 2023-02-07 DIAGNOSIS — K219 Gastro-esophageal reflux disease without esophagitis: Secondary | ICD-10-CM | POA: Diagnosis present

## 2023-02-07 DIAGNOSIS — E785 Hyperlipidemia, unspecified: Secondary | ICD-10-CM | POA: Diagnosis not present

## 2023-02-07 DIAGNOSIS — S199XXA Unspecified injury of neck, initial encounter: Secondary | ICD-10-CM | POA: Diagnosis not present

## 2023-02-07 DIAGNOSIS — M19011 Primary osteoarthritis, right shoulder: Secondary | ICD-10-CM | POA: Diagnosis not present

## 2023-02-07 DIAGNOSIS — T68XXXA Hypothermia, initial encounter: Secondary | ICD-10-CM | POA: Diagnosis not present

## 2023-02-07 DIAGNOSIS — R262 Difficulty in walking, not elsewhere classified: Secondary | ICD-10-CM | POA: Diagnosis not present

## 2023-02-07 DIAGNOSIS — Z79899 Other long term (current) drug therapy: Secondary | ICD-10-CM

## 2023-02-07 DIAGNOSIS — R531 Weakness: Secondary | ICD-10-CM | POA: Diagnosis not present

## 2023-02-07 DIAGNOSIS — J188 Other pneumonia, unspecified organism: Secondary | ICD-10-CM | POA: Diagnosis not present

## 2023-02-07 DIAGNOSIS — F17210 Nicotine dependence, cigarettes, uncomplicated: Secondary | ICD-10-CM | POA: Diagnosis present

## 2023-02-07 DIAGNOSIS — S72001A Fracture of unspecified part of neck of right femur, initial encounter for closed fracture: Secondary | ICD-10-CM | POA: Diagnosis not present

## 2023-02-07 DIAGNOSIS — Z741 Need for assistance with personal care: Secondary | ICD-10-CM | POA: Diagnosis not present

## 2023-02-07 DIAGNOSIS — T502X5A Adverse effect of carbonic-anhydrase inhibitors, benzothiadiazides and other diuretics, initial encounter: Secondary | ICD-10-CM | POA: Diagnosis not present

## 2023-02-07 DIAGNOSIS — M1990 Unspecified osteoarthritis, unspecified site: Secondary | ICD-10-CM | POA: Diagnosis present

## 2023-02-07 DIAGNOSIS — I5023 Acute on chronic systolic (congestive) heart failure: Secondary | ICD-10-CM | POA: Diagnosis present

## 2023-02-07 DIAGNOSIS — H409 Unspecified glaucoma: Secondary | ICD-10-CM | POA: Diagnosis present

## 2023-02-07 DIAGNOSIS — Z91410 Personal history of adult physical and sexual abuse: Secondary | ICD-10-CM

## 2023-02-07 DIAGNOSIS — I5032 Chronic diastolic (congestive) heart failure: Secondary | ICD-10-CM | POA: Diagnosis not present

## 2023-02-07 DIAGNOSIS — G3184 Mild cognitive impairment, so stated: Secondary | ICD-10-CM | POA: Diagnosis not present

## 2023-02-07 DIAGNOSIS — R6521 Severe sepsis with septic shock: Secondary | ICD-10-CM | POA: Diagnosis present

## 2023-02-07 DIAGNOSIS — R2689 Other abnormalities of gait and mobility: Secondary | ICD-10-CM | POA: Diagnosis not present

## 2023-02-07 DIAGNOSIS — S32591A Other specified fracture of right pubis, initial encounter for closed fracture: Secondary | ICD-10-CM | POA: Diagnosis not present

## 2023-02-07 DIAGNOSIS — Z79890 Hormone replacement therapy (postmenopausal): Secondary | ICD-10-CM

## 2023-02-07 DIAGNOSIS — R404 Transient alteration of awareness: Secondary | ICD-10-CM | POA: Diagnosis not present

## 2023-02-07 DIAGNOSIS — Z7401 Bed confinement status: Secondary | ICD-10-CM | POA: Diagnosis not present

## 2023-02-07 DIAGNOSIS — R278 Other lack of coordination: Secondary | ICD-10-CM | POA: Diagnosis not present

## 2023-02-07 DIAGNOSIS — R2681 Unsteadiness on feet: Secondary | ICD-10-CM | POA: Diagnosis not present

## 2023-02-07 DIAGNOSIS — M6281 Muscle weakness (generalized): Secondary | ICD-10-CM | POA: Diagnosis not present

## 2023-02-07 DIAGNOSIS — R55 Syncope and collapse: Secondary | ICD-10-CM | POA: Diagnosis not present

## 2023-02-07 DIAGNOSIS — R0989 Other specified symptoms and signs involving the circulatory and respiratory systems: Secondary | ICD-10-CM | POA: Diagnosis not present

## 2023-02-07 DIAGNOSIS — M25521 Pain in right elbow: Secondary | ICD-10-CM | POA: Diagnosis not present

## 2023-02-07 DIAGNOSIS — W19XXXA Unspecified fall, initial encounter: Secondary | ICD-10-CM | POA: Diagnosis not present

## 2023-02-07 DIAGNOSIS — J441 Chronic obstructive pulmonary disease with (acute) exacerbation: Secondary | ICD-10-CM | POA: Diagnosis not present

## 2023-02-07 DIAGNOSIS — M50222 Other cervical disc displacement at C5-C6 level: Secondary | ICD-10-CM | POA: Diagnosis not present

## 2023-02-07 DIAGNOSIS — I10 Essential (primary) hypertension: Secondary | ICD-10-CM | POA: Diagnosis not present

## 2023-02-07 DIAGNOSIS — I4891 Unspecified atrial fibrillation: Secondary | ICD-10-CM | POA: Diagnosis not present

## 2023-02-07 DIAGNOSIS — I483 Typical atrial flutter: Secondary | ICD-10-CM | POA: Diagnosis not present

## 2023-02-07 DIAGNOSIS — S81812A Laceration without foreign body, left lower leg, initial encounter: Secondary | ICD-10-CM | POA: Diagnosis present

## 2023-02-07 DIAGNOSIS — S0990XA Unspecified injury of head, initial encounter: Secondary | ICD-10-CM | POA: Diagnosis not present

## 2023-02-07 DIAGNOSIS — I5042 Chronic combined systolic (congestive) and diastolic (congestive) heart failure: Secondary | ICD-10-CM | POA: Diagnosis not present

## 2023-02-07 HISTORY — PX: INTRAMEDULLARY (IM) NAIL INTERTROCHANTERIC: SHX5875

## 2023-02-07 LAB — HEMOGLOBIN A1C
Hgb A1c MFr Bld: 7.7 % — ABNORMAL HIGH (ref 4.8–5.6)
Mean Plasma Glucose: 174.29 mg/dL

## 2023-02-07 LAB — CBC WITH DIFFERENTIAL/PLATELET
Abs Immature Granulocytes: 0.17 10*3/uL — ABNORMAL HIGH (ref 0.00–0.07)
Basophils Absolute: 0.1 10*3/uL (ref 0.0–0.1)
Basophils Relative: 0 %
Eosinophils Absolute: 0 10*3/uL (ref 0.0–0.5)
Eosinophils Relative: 0 %
HCT: 31.1 % — ABNORMAL LOW (ref 36.0–46.0)
Hemoglobin: 10 g/dL — ABNORMAL LOW (ref 12.0–15.0)
Immature Granulocytes: 1 %
Lymphocytes Relative: 8 %
Lymphs Abs: 1.6 10*3/uL (ref 0.7–4.0)
MCH: 30 pg (ref 26.0–34.0)
MCHC: 32.2 g/dL (ref 30.0–36.0)
MCV: 93.4 fL (ref 80.0–100.0)
Monocytes Absolute: 1.3 10*3/uL — ABNORMAL HIGH (ref 0.1–1.0)
Monocytes Relative: 7 %
Neutro Abs: 17.1 10*3/uL — ABNORMAL HIGH (ref 1.7–7.7)
Neutrophils Relative %: 84 %
Platelets: 208 10*3/uL (ref 150–400)
RBC: 3.33 MIL/uL — ABNORMAL LOW (ref 3.87–5.11)
RDW: 13.4 % (ref 11.5–15.5)
WBC: 20.2 10*3/uL — ABNORMAL HIGH (ref 4.0–10.5)
nRBC: 0 % (ref 0.0–0.2)

## 2023-02-07 LAB — LACTIC ACID, PLASMA
Lactic Acid, Venous: 7.4 mmol/L (ref 0.5–1.9)
Lactic Acid, Venous: 6.7 mmol/L (ref 0.5–1.9)
Lactic Acid, Venous: 5 mmol/L (ref 0.5–1.9)
Lactic Acid, Venous: 5.9 mmol/L (ref 0.5–1.9)
Lactic Acid, Venous: 4.2 mmol/L (ref 0.5–1.9)

## 2023-02-07 LAB — CBC
HCT: 28.6 % — ABNORMAL LOW (ref 36.0–46.0)
HCT: 28.1 % — ABNORMAL LOW (ref 36.0–46.0)
Hemoglobin: 9.2 g/dL — ABNORMAL LOW (ref 12.0–15.0)
Hemoglobin: 9 g/dL — ABNORMAL LOW (ref 12.0–15.0)
MCH: 29.7 pg (ref 26.0–34.0)
MCH: 29.6 pg (ref 26.0–34.0)
MCHC: 32.2 g/dL (ref 30.0–36.0)
MCHC: 32 g/dL (ref 30.0–36.0)
MCV: 92.3 fL (ref 80.0–100.0)
MCV: 92.4 fL (ref 80.0–100.0)
Platelets: 176 10*3/uL (ref 150–400)
Platelets: 200 10*3/uL (ref 150–400)
RBC: 3.1 MIL/uL — ABNORMAL LOW (ref 3.87–5.11)
RBC: 3.04 MIL/uL — ABNORMAL LOW (ref 3.87–5.11)
RDW: 13.5 % (ref 11.5–15.5)
RDW: 13.7 % (ref 11.5–15.5)
WBC: 15.8 10*3/uL — ABNORMAL HIGH (ref 4.0–10.5)
WBC: 18.5 10*3/uL — ABNORMAL HIGH (ref 4.0–10.5)
nRBC: 0 % (ref 0.0–0.2)
nRBC: 0 % (ref 0.0–0.2)

## 2023-02-07 LAB — COMPREHENSIVE METABOLIC PANEL
ALT: 17 U/L (ref 0–44)
AST: 37 U/L (ref 15–41)
Albumin: 2.8 g/dL — ABNORMAL LOW (ref 3.5–5.0)
Alkaline Phosphatase: 37 U/L — ABNORMAL LOW (ref 38–126)
Anion gap: 15 (ref 5–15)
BUN: 20 mg/dL (ref 8–23)
CO2: 22 mmol/L (ref 22–32)
Calcium: 8.1 mg/dL — ABNORMAL LOW (ref 8.9–10.3)
Chloride: 99 mmol/L (ref 98–111)
Creatinine, Ser: 1.07 mg/dL — ABNORMAL HIGH (ref 0.44–1.00)
GFR, Estimated: 51 mL/min — ABNORMAL LOW (ref >60)
Glucose, Bld: 460 mg/dL — ABNORMAL HIGH (ref 70–99)
Potassium: 3 mmol/L — ABNORMAL LOW (ref 3.5–5.1)
Sodium: 136 mmol/L (ref 135–145)
Total Bilirubin: 0.7 mg/dL (ref 0.3–1.2)
Total Protein: 5 g/dL — ABNORMAL LOW (ref 6.5–8.1)

## 2023-02-07 LAB — MRSA NEXT GEN BY PCR, NASAL: MRSA by PCR Next Gen: NOT DETECTED

## 2023-02-07 LAB — URINALYSIS, W/ REFLEX TO CULTURE (INFECTION SUSPECTED)
Bilirubin Urine: NEGATIVE
Glucose, UA: >=500 mg/dL — AB
Hgb urine dipstick: NEGATIVE
Ketones, ur: 5 mg/dL — AB
Leukocytes,Ua: NEGATIVE
Nitrite: NEGATIVE
Protein, ur: 30 mg/dL — AB
Specific Gravity, Urine: 1.015 (ref 1.005–1.030)
pH: 5 (ref 5.0–8.0)

## 2023-02-07 LAB — PROCALCITONIN: Procalcitonin: 1.26 ng/mL

## 2023-02-07 LAB — BASIC METABOLIC PANEL
Anion gap: 15 (ref 5–15)
BUN: 18 mg/dL (ref 8–23)
CO2: 19 mmol/L — ABNORMAL LOW (ref 22–32)
Calcium: 7.7 mg/dL — ABNORMAL LOW (ref 8.9–10.3)
Chloride: 108 mmol/L (ref 98–111)
Creatinine, Ser: 0.94 mg/dL (ref 0.44–1.00)
GFR, Estimated: 59 mL/min — ABNORMAL LOW (ref >60)
Glucose, Bld: 272 mg/dL — ABNORMAL HIGH (ref 70–99)
Potassium: 3.5 mmol/L (ref 3.5–5.1)
Sodium: 142 mmol/L (ref 135–145)

## 2023-02-07 LAB — PROTIME-INR
INR: 1.3 — ABNORMAL HIGH (ref 0.8–1.2)
Prothrombin Time: 16 s — ABNORMAL HIGH (ref 11.4–15.2)

## 2023-02-07 LAB — LIPASE, BLOOD: Lipase: 28 U/L (ref 11–51)

## 2023-02-07 LAB — SARS CORONAVIRUS 2 BY RT PCR: SARS Coronavirus 2 by RT PCR: NEGATIVE

## 2023-02-07 LAB — GLUCOSE, CAPILLARY
Glucose-Capillary: 151 mg/dL — ABNORMAL HIGH (ref 70–99)
Glucose-Capillary: 219 mg/dL — ABNORMAL HIGH (ref 70–99)
Glucose-Capillary: 221 mg/dL — ABNORMAL HIGH (ref 70–99)
Glucose-Capillary: 245 mg/dL — ABNORMAL HIGH (ref 70–99)

## 2023-02-07 LAB — TSH: TSH: 3.984 u[IU]/mL (ref 0.350–4.500)

## 2023-02-07 LAB — CBG MONITORING, ED: Glucose-Capillary: 408 mg/dL — ABNORMAL HIGH (ref 70–99)

## 2023-02-07 LAB — PHOSPHORUS: Phosphorus: 5 mg/dL — ABNORMAL HIGH (ref 2.5–4.6)

## 2023-02-07 LAB — CK: Total CK: 336 U/L — ABNORMAL HIGH (ref 38–234)

## 2023-02-07 LAB — MAGNESIUM: Magnesium: 2.1 mg/dL (ref 1.7–2.4)

## 2023-02-07 LAB — APTT: aPTT: 29 s (ref 24–36)

## 2023-02-07 SURGERY — FIXATION, FRACTURE, INTERTROCHANTERIC, WITH INTRAMEDULLARY ROD
Anesthesia: General | Laterality: Right

## 2023-02-07 MED ORDER — ACETAMINOPHEN 10 MG/ML IV SOLN
INTRAVENOUS | Status: DC | PRN
  Administered 2023-02-07: 1000 mg via INTRAVENOUS

## 2023-02-07 MED ORDER — FENTANYL CITRATE (PF) 100 MCG/2ML IJ SOLN
INTRAMUSCULAR | Status: DC | PRN
  Administered 2023-02-07: 50 ug via INTRAVENOUS

## 2023-02-07 MED ORDER — PHENYLEPHRINE 80 MCG/ML (10ML) SYRINGE FOR IV PUSH (FOR BLOOD PRESSURE SUPPORT)
PREFILLED_SYRINGE | INTRAVENOUS | Status: AC
  Filled 2023-02-07: qty 10

## 2023-02-07 MED ORDER — ACETAMINOPHEN 500 MG PO TABS
500.0000 mg | ORAL_TABLET | Freq: Four times a day (QID) | ORAL | Status: AC
  Administered 2023-02-07 – 2023-02-08 (×3): 500 mg via ORAL
  Filled 2023-02-07 (×3): qty 1

## 2023-02-07 MED ORDER — CEFAZOLIN SODIUM-DEXTROSE 2-4 GM/100ML-% IV SOLN
2.0000 g | Freq: Four times a day (QID) | INTRAVENOUS | Status: AC
  Administered 2023-02-07 – 2023-02-08 (×3): 2 g via INTRAVENOUS
  Filled 2023-02-07 (×3): qty 100

## 2023-02-07 MED ORDER — OXYCODONE HCL 5 MG/5ML PO SOLN: 5.0000 mg | Freq: Once | ORAL | Status: DC | PRN

## 2023-02-07 MED ORDER — SODIUM CHLORIDE 0.9 % IV BOLUS (SEPSIS)
2000.0000 mL | Freq: Once | INTRAVENOUS | Status: AC
  Administered 2023-02-07: 2000 mL via INTRAVENOUS

## 2023-02-07 MED ORDER — SODIUM CHLORIDE 0.9 % IV SOLN
100.0000 mg | Freq: Once | INTRAVENOUS | Status: AC
  Administered 2023-02-07: 100 mg via INTRAVENOUS
  Filled 2023-02-07: qty 100

## 2023-02-07 MED ORDER — SODIUM CHLORIDE 0.9 % IV SOLN: 250.0000 mL | INTRAVENOUS | Status: DC

## 2023-02-07 MED ORDER — CALCIUM CHLORIDE 10 % IV SOLN
INTRAVENOUS | Status: DC | PRN
Start: 2023-02-07 — End: 2023-02-07
  Administered 2023-02-07: 1 g via INTRAVENOUS

## 2023-02-07 MED ORDER — HYDROCODONE-ACETAMINOPHEN 5-325 MG PO TABS: 1.0000 | ORAL_TABLET | ORAL | Status: DC | PRN

## 2023-02-07 MED ORDER — MORPHINE SULFATE (PF) 2 MG/ML IV SOLN
0.5000 mg | INTRAVENOUS | Status: DC | PRN
  Administered 2023-02-07 – 2023-02-09 (×3): 1 mg via INTRAVENOUS
  Administered 2023-02-09: 0.5 mg via INTRAVENOUS
  Administered 2023-02-09 – 2023-02-10 (×2): 1 mg via INTRAVENOUS
  Filled 2023-02-07 (×6): qty 1

## 2023-02-07 MED ORDER — LIDOCAINE HCL (CARDIAC) PF 100 MG/5ML IV SOSY
PREFILLED_SYRINGE | INTRAVENOUS | Status: DC | PRN
  Administered 2023-02-07: 60 mg via INTRAVENOUS

## 2023-02-07 MED ORDER — SODIUM CHLORIDE 0.9 % IV SOLN
2.0000 g | Freq: Once | INTRAVENOUS | Status: DC
  Filled 2023-02-07: qty 20

## 2023-02-07 MED ORDER — MAGNESIUM HYDROXIDE 400 MG/5ML PO SUSP: 30.0000 mL | Freq: Every day | ORAL | Status: DC | PRN

## 2023-02-07 MED ORDER — PHENYLEPHRINE HCL (PRESSORS) 10 MG/ML IV SOLN
INTRAVENOUS | Status: AC
  Filled 2023-02-07: qty 1

## 2023-02-07 MED ORDER — PHENYLEPHRINE HCL-NACL 20-0.9 MG/250ML-% IV SOLN
INTRAVENOUS | Status: DC | PRN
Start: 2023-02-07 — End: 2023-02-07
  Administered 2023-02-07: 40 ug/min via INTRAVENOUS

## 2023-02-07 MED ORDER — BUPIVACAINE LIPOSOME 1.3 % IJ SUSP
INTRAMUSCULAR | Status: DC | PRN
  Administered 2023-02-07: 10 mL

## 2023-02-07 MED ORDER — ROCURONIUM BROMIDE 100 MG/10ML IV SOLN
INTRAVENOUS | Status: DC | PRN
  Administered 2023-02-07: 30 mg via INTRAVENOUS

## 2023-02-07 MED ORDER — NOREPINEPHRINE 4 MG/250ML-% IV SOLN
2.0000 ug/min | INTRAVENOUS | Status: DC
  Administered 2023-02-07 (×2): 2 ug/min via INTRAVENOUS
  Filled 2023-02-07: qty 250

## 2023-02-07 MED ORDER — ACETAMINOPHEN 10 MG/ML IV SOLN
1000.0000 mg | Freq: Once | INTRAVENOUS | Status: AC
  Administered 2023-02-07: 1000 mg via INTRAVENOUS
  Filled 2023-02-07: qty 100

## 2023-02-07 MED ORDER — SODIUM CHLORIDE 0.9 % IV SOLN
2.0000 g | Freq: Once | INTRAVENOUS | Status: AC
  Administered 2023-02-07: 2 g via INTRAVENOUS
  Filled 2023-02-07: qty 20

## 2023-02-07 MED ORDER — METOCLOPRAMIDE HCL 5 MG/ML IJ SOLN
5.0000 mg | Freq: Three times a day (TID) | INTRAMUSCULAR | Status: DC | PRN
  Administered 2023-02-07 – 2023-02-13 (×3): 10 mg via INTRAVENOUS
  Filled 2023-02-07 (×3): qty 2

## 2023-02-07 MED ORDER — NOREPINEPHRINE 4 MG/250ML-% IV SOLN
INTRAVENOUS | Status: AC
  Filled 2023-02-07: qty 250

## 2023-02-07 MED ORDER — POLYETHYLENE GLYCOL 3350 17 G PO PACK
17.0000 g | PACK | Freq: Every day | ORAL | Status: DC | PRN
  Filled 2023-02-07: qty 1

## 2023-02-07 MED ORDER — CHLORHEXIDINE GLUCONATE CLOTH 2 % EX PADS
6.0000 | MEDICATED_PAD | Freq: Every day | CUTANEOUS | Status: DC
  Administered 2023-02-07 – 2023-02-13 (×7): 6 via TOPICAL

## 2023-02-07 MED ORDER — ENOXAPARIN SODIUM 30 MG/0.3ML IJ SOSY
30.0000 mg | PREFILLED_SYRINGE | INTRAMUSCULAR | Status: DC
  Administered 2023-02-08: 30 mg via SUBCUTANEOUS
  Filled 2023-02-07: qty 0.3

## 2023-02-07 MED ORDER — LACTATED RINGERS IV BOLUS
250.0000 mL | Freq: Once | INTRAVENOUS | Status: AC
  Administered 2023-02-07: 250 mL via INTRAVENOUS

## 2023-02-07 MED ORDER — DIPHENHYDRAMINE HCL 12.5 MG/5ML PO ELIX: 12.5000 mg | ORAL_SOLUTION | ORAL | Status: DC | PRN

## 2023-02-07 MED ORDER — SODIUM CHLORIDE 0.9 % IV SOLN
250.0000 mL | INTRAVENOUS | Status: DC
  Administered 2023-02-07 (×2): 250 mL via INTRAVENOUS

## 2023-02-07 MED ORDER — POTASSIUM CHLORIDE 10 MEQ/100ML IV SOLN
10.0000 meq | INTRAVENOUS | Status: AC
  Administered 2023-02-07 (×2): 10 meq via INTRAVENOUS
  Filled 2023-02-07 (×3): qty 100

## 2023-02-07 MED ORDER — ACETAMINOPHEN 500 MG PO TABS
500.0000 mg | ORAL_TABLET | Freq: Four times a day (QID) | ORAL | Status: DC
  Filled 2023-02-07: qty 1

## 2023-02-07 MED ORDER — SUGAMMADEX SODIUM 200 MG/2ML IV SOLN
INTRAVENOUS | Status: DC | PRN
  Administered 2023-02-07: 241.2 mg via INTRAVENOUS

## 2023-02-07 MED ORDER — ONDANSETRON HCL 4 MG/2ML IJ SOLN
INTRAMUSCULAR | Status: DC | PRN
  Administered 2023-02-07: 4 mg via INTRAVENOUS

## 2023-02-07 MED ORDER — POTASSIUM CHLORIDE 10 MEQ/100ML IV SOLN
10.0000 meq | INTRAVENOUS | Status: AC
  Administered 2023-02-07 (×2): 10 meq via INTRAVENOUS
  Filled 2023-02-07 (×2): qty 100

## 2023-02-07 MED ORDER — POTASSIUM CHLORIDE 10 MEQ/100ML IV SOLN
10.0000 meq | INTRAVENOUS | Status: DC
  Administered 2023-02-07 (×2): 10 meq via INTRAVENOUS
  Filled 2023-02-07: qty 100

## 2023-02-07 MED ORDER — CEFAZOLIN SODIUM-DEXTROSE 2-4 GM/100ML-% IV SOLN
2.0000 g | INTRAVENOUS | Status: AC
  Administered 2023-02-07: 2 g via INTRAVENOUS
  Filled 2023-02-07: qty 100

## 2023-02-07 MED ORDER — DOCUSATE SODIUM 100 MG PO CAPS
100.0000 mg | ORAL_CAPSULE | Freq: Two times a day (BID) | ORAL | Status: DC
  Administered 2023-02-07 – 2023-02-14 (×10): 100 mg via ORAL
  Filled 2023-02-07 (×13): qty 1

## 2023-02-07 MED ORDER — METOCLOPRAMIDE HCL 5 MG PO TABS: 5.0000 mg | ORAL_TABLET | Freq: Three times a day (TID) | ORAL | Status: DC | PRN

## 2023-02-07 MED ORDER — BUPIVACAINE LIPOSOME 1.3 % IJ SUSP
INTRAMUSCULAR | Status: AC
  Filled 2023-02-07: qty 20

## 2023-02-07 MED ORDER — SODIUM CHLORIDE 0.9 % IV SOLN: 250.0000 mL | INTRAVENOUS | Status: AC

## 2023-02-07 MED ORDER — FENTANYL CITRATE (PF) 100 MCG/2ML IJ SOLN: 25.0000 ug | INTRAMUSCULAR | Status: DC | PRN

## 2023-02-07 MED ORDER — ONDANSETRON HCL 4 MG/2ML IJ SOLN
INTRAMUSCULAR | Status: AC
  Filled 2023-02-07: qty 2

## 2023-02-07 MED ORDER — FENTANYL CITRATE PF 50 MCG/ML IJ SOSY
12.5000 ug | PREFILLED_SYRINGE | Freq: Once | INTRAMUSCULAR | Status: AC
  Administered 2023-02-07: 12.5 ug via INTRAVENOUS
  Filled 2023-02-07: qty 1

## 2023-02-07 MED ORDER — INSULIN ASPART 100 UNIT/ML IJ SOLN
0.0000 [IU] | INTRAMUSCULAR | Status: DC
  Administered 2023-02-07: 5 [IU] via SUBCUTANEOUS
  Administered 2023-02-07 – 2023-02-08 (×2): 3 [IU] via SUBCUTANEOUS
  Administered 2023-02-10: 5 [IU] via SUBCUTANEOUS
  Administered 2023-02-11: 2 [IU] via SUBCUTANEOUS
  Filled 2023-02-07 (×5): qty 1

## 2023-02-07 MED ORDER — SODIUM CHLORIDE 0.9 % IV SOLN
250.0000 mL | INTRAVENOUS | Status: DC
  Administered 2023-02-07: 250 mL via INTRAVENOUS

## 2023-02-07 MED ORDER — ACETAMINOPHEN 10 MG/ML IV SOLN
INTRAVENOUS | Status: AC
  Filled 2023-02-07: qty 100

## 2023-02-07 MED ORDER — SEVOFLURANE IN SOLN
RESPIRATORY_TRACT | Status: AC
  Filled 2023-02-07: qty 250

## 2023-02-07 MED ORDER — FENTANYL CITRATE (PF) 100 MCG/2ML IJ SOLN
INTRAMUSCULAR | Status: AC
  Filled 2023-02-07: qty 2

## 2023-02-07 MED ORDER — BUPIVACAINE-EPINEPHRINE (PF) 0.5% -1:200000 IJ SOLN
INTRAMUSCULAR | Status: DC | PRN
  Administered 2023-02-07: 20 mL

## 2023-02-07 MED ORDER — OXYCODONE HCL 5 MG PO TABS: 5.0000 mg | ORAL_TABLET | Freq: Once | ORAL | Status: DC | PRN

## 2023-02-07 MED ORDER — PROPOFOL 10 MG/ML IV BOLUS
INTRAVENOUS | Status: DC | PRN
  Administered 2023-02-07: 50 mg via INTRAVENOUS

## 2023-02-07 MED ORDER — FLEET ENEMA RE ENEM: 1.0000 | ENEMA | Freq: Once | RECTAL | Status: DC | PRN

## 2023-02-07 MED ORDER — DOCUSATE SODIUM 100 MG PO CAPS: 100.0000 mg | ORAL_CAPSULE | Freq: Two times a day (BID) | ORAL | Status: DC | PRN

## 2023-02-07 MED ORDER — CALCIUM CHLORIDE 10 % IV SOLN
INTRAVENOUS | Status: AC
  Filled 2023-02-07: qty 10

## 2023-02-07 MED ORDER — ORAL CARE MOUTH RINSE: 15.0000 mL | OROMUCOSAL | Status: DC | PRN

## 2023-02-07 MED ORDER — SODIUM CHLORIDE 0.9 % IV BOLUS
1000.0000 mL | Freq: Once | INTRAVENOUS | Status: AC
  Administered 2023-02-07: 1000 mL via INTRAVENOUS

## 2023-02-07 MED ORDER — 0.9 % SODIUM CHLORIDE (POUR BTL) OPTIME
TOPICAL | Status: DC | PRN
  Administered 2023-02-07: 500 mL

## 2023-02-07 MED ORDER — PHENYLEPHRINE 80 MCG/ML (10ML) SYRINGE FOR IV PUSH (FOR BLOOD PRESSURE SUPPORT)
PREFILLED_SYRINGE | INTRAVENOUS | Status: DC | PRN
  Administered 2023-02-07: 80 ug via INTRAVENOUS
  Administered 2023-02-07: 160 ug via INTRAVENOUS
  Administered 2023-02-07: 80 ug via INTRAVENOUS
  Administered 2023-02-07: 320 ug via INTRAVENOUS

## 2023-02-07 MED ORDER — BISACODYL 10 MG RE SUPP: 10.0000 mg | Freq: Every day | RECTAL | Status: DC | PRN

## 2023-02-07 MED ORDER — EPHEDRINE SULFATE-NACL 50-0.9 MG/10ML-% IV SOSY
PREFILLED_SYRINGE | INTRAVENOUS | Status: DC | PRN
  Administered 2023-02-07: 10 mg via INTRAVENOUS

## 2023-02-07 MED ORDER — TETANUS-DIPHTH-ACELL PERTUSSIS 5-2.5-18.5 LF-MCG/0.5 IM SUSY
0.5000 mL | PREFILLED_SYRINGE | Freq: Once | INTRAMUSCULAR | Status: AC
  Administered 2023-02-07: 0.5 mL via INTRAMUSCULAR
  Filled 2023-02-07: qty 0.5

## 2023-02-07 MED ORDER — ESMOLOL HCL 100 MG/10ML IV SOLN
INTRAVENOUS | Status: AC
  Filled 2023-02-07: qty 10

## 2023-02-07 MED ORDER — EPHEDRINE 5 MG/ML INJ
INTRAVENOUS | Status: AC
  Filled 2023-02-07: qty 5

## 2023-02-07 MED ORDER — ESMOLOL HCL 100 MG/10ML IV SOLN
INTRAVENOUS | Status: DC | PRN
Start: 2023-02-07 — End: 2023-02-07
  Administered 2023-02-07: 10 mg via INTRAVENOUS

## 2023-02-07 MED ORDER — ACETAMINOPHEN 325 MG PO TABS
325.0000 mg | ORAL_TABLET | Freq: Four times a day (QID) | ORAL | Status: DC | PRN
  Administered 2023-02-09 – 2023-02-10 (×2): 650 mg via ORAL
  Administered 2023-02-12: 325 mg via ORAL
  Administered 2023-02-12: 650 mg via ORAL
  Filled 2023-02-07 (×4): qty 2

## 2023-02-07 SURGICAL SUPPLY — 49 items
APL PRP STRL LF DISP 70% ISPRP (MISCELLANEOUS) ×2
BIT DRILL 4.3MMS DISTAL GRDTED (BIT) IMPLANT
BNDG CMPR 5X4 CHSV STRCH STRL (GAUZE/BANDAGES/DRESSINGS) ×1
BNDG CMPR 5X6 CHSV STRCH STRL (GAUZE/BANDAGES/DRESSINGS) ×1
BNDG COHESIVE 4X5 TAN STRL LF (GAUZE/BANDAGES/DRESSINGS) ×1 IMPLANT
BNDG COHESIVE 6X5 TAN ST LF (GAUZE/BANDAGES/DRESSINGS) ×1 IMPLANT
CHLORAPREP W/TINT 26 (MISCELLANEOUS) ×2 IMPLANT
CORTICAL BONE SCR 5.0MM X 48MM (Screw) ×1 IMPLANT
DRAPE C-ARMOR (DRAPES) ×1 IMPLANT
DRAPE SHEET LG 3/4 BI-LAMINATE (DRAPES) ×1 IMPLANT
DRILL 4.3MMS DISTAL GRADUATED (BIT) ×1
DRSG MEPILEX SACRM 8.7X9.8 (GAUZE/BANDAGES/DRESSINGS) ×1 IMPLANT
DRSG OPSITE POSTOP 3X4 (GAUZE/BANDAGES/DRESSINGS) IMPLANT
DRSG OPSITE POSTOP 4X6 (GAUZE/BANDAGES/DRESSINGS) IMPLANT
ELECT CAUTERY BLADE 6.4 (BLADE) ×1 IMPLANT
ELECT REM PT RETURN 9FT ADLT (ELECTROSURGICAL) ×1
ELECTRODE REM PT RTRN 9FT ADLT (ELECTROSURGICAL) ×1 IMPLANT
GAUZE SPONGE 4X4 12PLY STRL (GAUZE/BANDAGES/DRESSINGS) ×1 IMPLANT
GLOVE BIO SURGEON STRL SZ8 (GLOVE) ×2 IMPLANT
GLOVE INDICATOR 8.0 STRL GRN (GLOVE) ×1 IMPLANT
GOWN STRL REUS W/ TWL LRG LVL3 (GOWN DISPOSABLE) ×1 IMPLANT
GOWN STRL REUS W/ TWL XL LVL3 (GOWN DISPOSABLE) ×1 IMPLANT
GOWN STRL REUS W/TWL LRG LVL3 (GOWN DISPOSABLE) ×1
GOWN STRL REUS W/TWL XL LVL3 (GOWN DISPOSABLE) ×1
GUIDEPIN VERSANAIL DSP 3.2X444 (ORTHOPEDIC DISPOSABLE SUPPLIES) IMPLANT
GUIDEWIRE BALL NOSE 100CM (WIRE) IMPLANT
HANDLE YANKAUER SUCT OPEN TIP (MISCELLANEOUS) ×1 IMPLANT
HFN RH 130 DEG 11MM X 360MM (Orthopedic Implant) IMPLANT
MANIFOLD NEPTUNE II (INSTRUMENTS) ×1 IMPLANT
MAT ABSORB FLUID 56X50 GRAY (MISCELLANEOUS) ×1 IMPLANT
NDL FILTER BLUNT 18X1 1/2 (NEEDLE) ×1 IMPLANT
NDL HYPO 22X1.5 SAFETY MO (MISCELLANEOUS) ×1 IMPLANT
NEEDLE FILTER BLUNT 18X1 1/2 (NEEDLE) ×1 IMPLANT
NEEDLE HYPO 22X1.5 SAFETY MO (MISCELLANEOUS) ×1 IMPLANT
NS IRRIG 500ML POUR BTL (IV SOLUTION) ×1 IMPLANT
PACK HIP COMPR (MISCELLANEOUS) ×1 IMPLANT
SCREW BONE CORTICAL 5.0X44 (Screw) IMPLANT
SCREW CANN THRD AFF 10.5X100 (Screw) IMPLANT
SCREW CORTICL BON 5.0MM X 48MM (Screw) IMPLANT
STAPLER SKIN PROX 35W (STAPLE) ×1 IMPLANT
STRAP SAFETY 5IN WIDE (MISCELLANEOUS) ×1 IMPLANT
SUT VIC AB 0 CT1 36 (SUTURE) ×1 IMPLANT
SUT VIC AB 1 CT1 36 (SUTURE) ×1 IMPLANT
SUT VIC AB 2-0 CT1 (SUTURE) ×2 IMPLANT
SYR 10ML LL (SYRINGE) ×1 IMPLANT
SYR 30ML LL (SYRINGE) ×1 IMPLANT
TAPE MICROFOAM 4IN (TAPE) ×1 IMPLANT
TRAP FLUID SMOKE EVACUATOR (MISCELLANEOUS) ×1 IMPLANT
WATER STERILE IRR 500ML POUR (IV SOLUTION) ×1 IMPLANT

## 2023-02-07 NOTE — ED Notes (Signed)
Date and time results received: 02/07/23 0857  Test: Lactic Acid Critical Value: 7.4  Name of Provider Notified: MD Scotty Court

## 2023-02-07 NOTE — Anesthesia Preprocedure Evaluation (Signed)
Anesthesia Evaluation  Patient identified by MRN, date of birth, ID band Patient confused    Reviewed: Allergy & Precautions, H&P , NPO status , Patient's Chart, lab work & pertinent test results  History of Anesthesia Complications Negative for: history of anesthetic complications  Airway Mallampati: II  TM Distance: >3 FB Neck ROM: full    Dental  (+) Upper Dentures, Lower Dentures   Pulmonary neg pulmonary ROS, Patient abstained from smoking., former smoker   Pulmonary exam normal        Cardiovascular + Peripheral Vascular Disease and +CHF  + dysrhythmias Atrial Fibrillation      Neuro/Psych  Headaches negative neurological ROS  negative psych ROS   GI/Hepatic negative GI ROS, Neg liver ROS,,,  Endo/Other  negative endocrine ROSHypothyroidism    Renal/GU Renal disease     Musculoskeletal   Abdominal   Peds  Hematology negative hematology ROS (+)   Anesthesia Other Findings Past Medical History: No date: Anemia     Comment:  treated ~2009 with iron, resolved No date: Arthritis No date: Cataract     Comment:  surgery on R catarct 2012 No date: Chronic systolic CHF (congestive heart failure) (HCC)     Comment:  a. TTE 2012: EF of 45-50%, mild diffuse HK, trivial AI,               mild MR, mildly dilated RV with mild reduction of RVSF,               mild to moderate TR, mild to moderate PR, mildly elevated              PASP No date: COPD (chronic obstructive pulmonary disease) (HCC) No date: Family history of adverse reaction to anesthesia     Comment:  Mother - altered mental status (long term) No date: Glaucoma No date: History of pelvic fracture No date: Hyperglycemia     Comment:  mild inc in fasting sugar No date: Hyperlipidemia No date: Hypothyroidism No date: Insomnia No date: Migraines     Comment:  without aura, sx since age 46 No date: Osteopenia     Comment:  prev with 10 years of fosamax  and intolerant of evista No date: PAF (paroxysmal atrial fibrillation) (HCC)     Comment:  a. initially noted 2012; b. CHADS2VASc at least 5 (CHF,               age x 2, vasacular disease, female) No date: Pulmonary hypertension (HCC) No date: Wears dentures     Comment:  full upper and lower  Past Surgical History: No date: CATARACT EXTRACTION 08/27/2016: CATARACT EXTRACTION W/PHACO; Left     Comment:  Procedure: CATARACT EXTRACTION PHACO AND INTRAOCULAR               LENS PLACEMENT (IOC)  Left;  Surgeon: Lockie Mola, MD;  Location: [facility];  Service:               Ophthalmology;  Laterality: Left; 2012: COLONOSCOPY No date: REFRACTIVE SURGERY  BMI    Body Mass Index: 22.82 kg/m      Reproductive/Obstetrics negative OB ROS                             Anesthesia Physical Anesthesia Plan  ASA: 3 and emergent  Anesthesia Plan: General   Post-op Pain  Management:    Induction: Intravenous  PONV Risk Score and Plan: 3 and Ondansetron and Dexamethasone  Airway Management Planned: Oral ETT  Additional Equipment:   Intra-op Plan:   Post-operative Plan: Extubation in OR  Informed Consent: I have reviewed the patients History and Physical, chart, labs and discussed the procedure including the risks, benefits and alternatives for the proposed anesthesia with the patient or authorized representative who has indicated his/her understanding and acceptance.     Dental Advisory Given and Consent reviewed with POA  Plan Discussed with: Anesthesiologist, CRNA and Surgeon  Anesthesia Plan Comments: (Patient's daughters consented for risks of anesthesia including but not limited to:  - adverse reactions to medications - damage to eyes, teeth, lips or other oral mucosa - nerve damage due to positioning  - sore throat or hoarseness - Damage to heart, brain, nerves, lungs, other parts of body or loss of life  Patient's  daughters voiced understanding and assent.)       Anesthesia Quick Evaluation

## 2023-02-07 NOTE — ED Notes (Signed)
2,000 mL bolus of warm NS started.

## 2023-02-07 NOTE — ED Provider Notes (Signed)
[facility] Provider Note    Event Date/Time   First MD Initiated Contact with Patient 02/07/23 0805     (approximate)   History   Chief Complaint: Unresponsive   HPI  Vanessa [name] is a 88 y.o. female with a history of atrial fibrillation, hyperlipidemia, dementia who was found on the ground outside her home this morning at about 7:15 AM today.  Last seen on home cameras at 11 PM last night.  She was found at the bottom of the steps leading away from the home, unresponsive per EMS.  Initial axillary temperature of 84 degrees per EMS.  Patient's not able to participate in history or exam     Physical Exam   Triage Vital Signs: ED Triage Vitals [02/07/23 0802]  Encounter Vitals Group     BP      Systolic BP Percentile      Diastolic BP Percentile      Pulse      Resp      Temp      Temp src      SpO2 97 %     Weight      Height      Head Circumference      Peak Flow      Pain Score      Pain Loc      Pain Education      Exclude from Growth Chart     Most recent vital signs: Vitals:   02/07/23 1043 02/07/23 1046  BP: 94/64 111/89  Pulse: 89 85  Resp: (!) 21 16  Temp: (!) 88.5 F (31.4 C) (!) 88.6 F (31.4 C)  SpO2: 98% 100%    General: Not interactive. Grimacing, groaning. GCS = E1V2M4 = 7 CV:  Good peripheral perfusion.  Regular rate and rhythm, normal distal pulses Resp:  Normal effort.  Crackles in bilateral bases Abd:  No distention.  Soft nontender Other:  Tenderness at the right hip.  There is shortening of the right lower extremity with external rotation.  Chest wall stable, no bleeding wounds.  There is a skin tear on the left shin.  There is blood at the left side of the left head concerning for head trauma.  Patient arrives in a c-collar.   ED Results / Procedures / Treatments   Labs (all labs ordered are listed, but only abnormal results are displayed) Labs Reviewed  LACTIC ACID, PLASMA - Abnormal; Notable for the  following components:      Result Value   Lactic Acid, Venous 7.4 (*)    All other components within normal limits  LACTIC ACID, PLASMA - Abnormal; Notable for the following components:   Lactic Acid, Venous 6.7 (*)    All other components within normal limits  COMPREHENSIVE METABOLIC PANEL - Abnormal; Notable for the following components:   Potassium 3.0 (*)    Glucose, Bld 460 (*)    Creatinine, Ser 1.07 (*)    Calcium 8.1 (*)    Total Protein 5.0 (*)    Albumin 2.8 (*)    Alkaline Phosphatase 37 (*)    GFR, Estimated 51 (*)    All other components within normal limits  CBC WITH DIFFERENTIAL/PLATELET - Abnormal; Notable for the following components:   WBC 20.2 (*)    RBC 3.33 (*)    Hemoglobin 10.0 (*)    HCT 31.1 (*)    Neutro Abs 17.1 (*)    Monocytes Absolute 1.3 (*)  Abs Immature Granulocytes 0.17 (*)    All other components within normal limits  PROTIME-INR - Abnormal; Notable for the following components:   Prothrombin Time 16.0 (*)    INR 1.3 (*)    All other components within normal limits  URINALYSIS, W/ REFLEX TO CULTURE (INFECTION SUSPECTED) - Abnormal; Notable for the following components:   Color, Urine YELLOW (*)    APPearance CLEAR (*)    Glucose, UA >=500 (*)    Ketones, ur 5 (*)    Protein, ur 30 (*)    Bacteria, UA RARE (*)    All other components within normal limits  CK - Abnormal; Notable for the following components:   Total CK 336 (*)    All other components within normal limits  CBG MONITORING, ED - Abnormal; Notable for the following components:   Glucose-Capillary 408 (*)    All other components within normal limits  CULTURE, BLOOD (ROUTINE X 2)  CULTURE, BLOOD (ROUTINE X 2)  APTT  LIPASE, BLOOD  TSH  MAGNESIUM  LEGIONELLA PNEUMOPHILA SEROGP 1 UR AG     EKG Interpreted by me Atrial fibrillation, rate of 84.  Normal axis, prolonged QTc of 630 ms.  Normal QRS ST segments and T waves.   RADIOLOGY CT head interpreted by me,  negative for intracranial hemorrhage.  Radiology report reviewed.   PROCEDURES:  .Critical Care  Performed by: Sharman Cheek, MD Authorized by: Sharman Cheek, MD   Critical care provider statement:    Critical care time (minutes):  40   Critical care time was exclusive of:  Separately billable procedures and treating other patients   Critical care was necessary to treat or prevent imminent or life-threatening deterioration of the following conditions:  Shock and CNS failure or compromise   Critical care was time spent personally by me on the following activities:  Development of treatment plan with patient or surrogate, discussions with consultants, evaluation of patient's response to treatment, examination of patient, obtaining history from patient or surrogate, ordering and performing treatments and interventions, ordering and review of laboratory studies, ordering and review of radiographic studies, pulse oximetry, re-evaluation of patient's condition and review of old charts   Care discussed with: admitting provider      MEDICATIONS ORDERED IN ED: Medications  potassium chloride 10 mEq in 100 mL IVPB (10 mEq Intravenous New Bag/Given 02/07/23 1125)  doxycycline (VIBRAMYCIN) 100 mg in sodium chloride 0.9 % 250 mL IVPB (100 mg Intravenous New Bag/Given 02/07/23 1009)  0.9 %  sodium chloride infusion (has no administration in time range)  norepinephrine (LEVOPHED) 4mg  in (0.016 mg/mL) premix infusion (2 mcg/min Intravenous Restarted 02/07/23 1105)  acetaminophen (OFIRMEV) IV 1,000 mg (has no administration in time range)  docusate sodium (COLACE) capsule 100 mg (has no administration in time range)  polyethylene glycol (MIRALAX / GLYCOLAX) packet 17 g (has no administration in time range)  sodium chloride 0.9 % bolus 2,000 mL (0 mLs Intravenous Stopped 02/07/23 1021)  cefTRIAXone (ROCEPHIN) 2 g in sodium chloride 0.9 % 100 mL IVPB (0 g Intravenous Stopped 02/07/23 0932)   Tdap (BOOSTRIX) injection 0.5 mL (0.5 mLs Intramuscular Given 02/07/23 0834)  sodium chloride 0.9 % bolus 1,000 mL (1,000 mLs Intravenous New Bag/Given 02/07/23 0946)  fentaNYL (SUBLIMAZE) injection 12.5 mcg (12.5 mcg Intravenous Given 02/07/23 1120)     IMPRESSION / MDM / ASSESSMENT AND PLAN / ED COURSE  I reviewed the triage vital signs and the nursing notes.  DDx: Intracranial hemorrhage, skull fracture,  C-spine fracture, hip fracture, dehydration, AKI, electrolyte abnormality, anemia, pneumonia, pleural effusion, pulmonary edema, hypothermia from environmental exposure, sepsis, hypothyroidism  Patient's presentation is most consistent with acute presentation with potential threat to life or bodily function.  Patient arrives with altered mental status, hypotension, hypothermia which may be due to mechanical fall and environmental exposure.  Will resuscitate with 2 L warm fluids, give empiric Rocephin for possibility of septic shock while obtaining CT head cervical spine and x-rays of the hip pelvis and chest for trauma workup.   Clinical Course as of 02/07/23 1127  Thu Feb 07, 2023  [id number] Family updated on results thus far.  Patient's received 30 mL/kg IV fluid bolus, sepsis reassessment completed, blood pressure appears normalized.  Will follow-up repeat lactate.  Vasopressors not indicated. [PS]  [phone] X-ray right hip interpreted by me, shows comminuted intertrochanteric fracture.  Chest x-ray without acute findings.  Urinalysis negative for signs of infection [PS]    Clinical Course User Index [PS] Sharman Cheek, MD    ----------------------------------------- 11:26 AM on 02/07/2023 ----------------------------------------- Hip fracture discussed with orthopedics Dr. Joice Lofts who could do surgery today around 3:30 PM if patient is stabilized at that time. Blood pressure remains borderline, patient continues to warm slowly.  Mental status is improving.  Case discussed with ICU team  who will admit.  Discussed with hospitalist as well for preop consultation.   FINAL CLINICAL IMPRESSION(S) / ED DIAGNOSES   Final diagnoses:  Closed intertrochanteric fracture of right hip, initial encounter (HCC)  Hypothermia, initial encounter  Shock (HCC)     Rx / DC Orders   ED Discharge Orders     None        Note:  This document was prepared using Dragon voice recognition software and may include unintentional dictation errors.   Sharman Cheek, MD 02/07/23 [phone]

## 2023-02-07 NOTE — ED Triage Notes (Signed)
Patient to ED via [facility] from home. Patient was found unresponsive by family at about 0715 today. Patient was last seen on her home cameras around 2300 last night. Family says patient seems to have fallen down her stairs outside her home. Patient has Hx of CHF and Afib. In triage, patient responds to painful stimuli. Patient makes groaning noises but is nonverbal at this time.

## 2023-02-07 NOTE — ED Notes (Signed)
X-ray called to let them know pt is ready to go to X ray

## 2023-02-07 NOTE — Consult Note (Signed)
ORTHOPAEDIC CONSULTATION  REQUESTING PHYSICIAN: Erin Fulling, MD  Chief Complaint:   Right hip pain.  History of Present Illness: Vanessa [name] is a 88 y.o. female with multiple medical problems including chronic systolic congestive heart failure, COPD, glaucoma, hyperlipidemia, hypothyroidism, migraines, paroxysmal atrial fibrillation, pulmonary hypertension, and osteopenia who lives independently.  The patient apparently got up last night and went outside, then apparently fell and broke her hip.  The incident and fall were both unwitnessed and the family wonders if she may have been sleepwalking.  She was not found until this morning.  She was brought to the emergency room where she was quite hypothermic and hypotensive.  The patient has been resuscitated and has been deemed stable for definitive management of her right hip injury.  The patient denies any associated injuries.  She apparently did not strike her head or lose consciousness.  As she does not recall the incident well, she is unable to identify if there were any associated symptoms such as lightheadedness, dizziness, chest pain, shortness of breath, or other symptoms which may have contributed to her fall.  Past Medical History:  Diagnosis Date   Anemia    treated ~2009 with iron, resolved   Arthritis    Cataract    surgery on R catarct 2012   Chronic systolic CHF (congestive heart failure) (HCC)    a. TTE 2012: EF of 45-50%, mild diffuse HK, trivial AI, mild MR, mildly dilated RV with mild reduction of RVSF, mild to moderate TR, mild to moderate PR, mildly elevated PASP   COPD (chronic obstructive pulmonary disease) (HCC)    Family history of adverse reaction to anesthesia    Mother - altered mental status (long term)   Glaucoma    History of pelvic fracture    Hyperglycemia    mild inc in fasting sugar   Hyperlipidemia    Hypothyroidism    Insomnia     Migraines    without aura, sx since age 87   Osteopenia    prev with 10 years of fosamax and intolerant of evista   PAF (paroxysmal atrial fibrillation) (HCC)    a. initially noted 2012; b. CHADS2VASc at least 5 (CHF, age x 2, vasacular disease, female)   Pulmonary hypertension (HCC)    Wears dentures    full upper and lower   Past Surgical History:  Procedure Laterality Date   CATARACT EXTRACTION     CATARACT EXTRACTION W/PHACO Left 08/27/2016   Procedure: CATARACT EXTRACTION PHACO AND INTRAOCULAR LENS PLACEMENT (IOC)  Left;  Surgeon: Lockie Mola, MD;  Location: [facility];  Service: Ophthalmology;  Laterality: Left;   COLONOSCOPY  2012   REFRACTIVE SURGERY     Social History   Socioeconomic History   Marital status: Widowed    Spouse name: Not on file   Number of children: Not on file   Years of education: Not on file   Highest education level: Some college, no degree  Occupational History   Not on file  Tobacco Use   Smoking status: Former    Current packs/day: 0.25    Average packs/day: 0.3 packs/day for 50.0 years (12.5 ttl pk-yrs)    Types: Cigarettes   Smokeless tobacco: Never   Tobacco comments:    started smoking in 1958- stopped 2020 after a fall  Vaping Use   Vaping status: Never Used  Substance and Sexual Activity   Alcohol use: No   Drug use: No   Sexual activity: Never  Other  Topics Concern   Not on file  Social History Narrative   From [place].     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  Was married for 47 years.     Active in church and senior groups.     Likes gospel singing.     Lives alone (with her dog Kirt Boys)   4 kids, all are nearby.     Social Determinants of Health   Financial Resource Strain: Low Risk  (08/10/2022)   Overall Financial Resource Strain (CARDIA)    Difficulty of Paying Living Expenses: Not very hard  Food Insecurity: No Food Insecurity (08/10/2022)   Hunger Vital Sign    Worried About Running Out of  Food in the Last Year: Never true    Ran Out of Food in the Last Year: Never true  Transportation Needs: No Transportation Needs (08/10/2022)   PRAPARE - Administrator, Civil Service (Medical): No    Lack of Transportation (Non-Medical): No  Physical Activity: Inactive (08/10/2022)   Exercise Vital Sign    Days of Exercise per Week: 0 days    Minutes of Exercise per Session: 0 min  Stress: No Stress Concern Present (08/10/2022)   [employer] of Occupational Health - Occupational Stress Questionnaire    Feeling of Stress : Not at all  Social Connections: Unknown (08/10/2022)   Social Connection and Isolation Panel [NHANES]    Frequency of Communication with Friends and Family: Patient declined    Frequency of Social Gatherings with Friends and Family: More than three times a week    Attends Religious Services: Patient declined    Database administrator or Organizations: No    Attends Banker Meetings: Never    Marital Status: Widowed  Recent Concern: Social Connections - Moderately Isolated (06/13/2022)   Social Connection and Isolation Panel [NHANES]    Frequency of Communication with Friends and Family: More than three times a week    Frequency of Social Gatherings with Friends and Family: More than three times a week    Attends Religious Services: More than 4 times per year    Active Member of [employer] or Organizations: No    Attends Banker Meetings: Never    Marital Status: Widowed   Family History  Problem Relation Age of Onset   Diabetes Father    Migraines Mother    Stroke Mother        TIAs   Breast cancer Neg Hx    Colon cancer Neg Hx    Allergies  Allergen Reactions   Codeine     Doesn't remember reaction   Cortisone     Doesn't remember reaction   Decongestant [Pseudoephedrine Hcl] Other (See Comments)    Avoid decongestants due to glaucoma hx.     Donepezil     Prolonged Qt   Naproxen     Stomach issues   Simvastatin      aches   Prior to Admission medications   Medication Sig Start Date End Date Taking? Authorizing Provider  Apoaequorin (PREVAGEN EXTRA STRENGTH) 20 MG CAPS Take 20 mg by mouth in the morning.   Yes [provider]  aspirin EC 81 MG tablet Take 81 mg by mouth daily.   Yes [provider]  Cholecalciferol (VITAMIN D) 50 MCG (2000 UT) CAPS Take 1 capsule (2,000 Units total) by mouth daily. 10/03/18  Yes Joaquim Nam, MD  Cranberry 1000 MG CAPS Take  by mouth daily.   Yes [provider]  furosemide (LASIX) 20 MG tablet Take 1 lasix a day.  But increase 2 tabs of lasix if AM weight is above 118 lbs. 11/26/22  Yes Joaquim Nam, MD  levothyroxine (SYNTHROID) 25 MCG tablet TAKE 1 TABLET BY MOUTH ONCE DAILY ON AN EMPTY STOMACH. WAIT 30 MINUTES BEFORE TAKING OTHER MEDS. 01/04/23  Yes Joaquim Nam, MD  pantoprazole (PROTONIX) 20 MG tablet Take 20 mg by mouth 2 (two) times daily before a meal. 11/27/22 11/27/23 Yes [provider]  Potassium 99 MG TABS Take 2 tablets (198 mg total) by mouth daily. 08/10/22  Yes Joaquim Nam, MD  latanoprost (XALATAN) 0.005 % ophthalmic solution Place 1 drop into both eyes at bedtime. Patient not taking: Reported on 02/07/2023    [provider]   DG Elbow 2 Views Right  Result Date: 02/07/2023 CLINICAL DATA:  Fall.  Right elbow pain. EXAM: RIGHT ELBOW - 2 VIEW COMPARISON:  None Available. FINDINGS: There is no evidence of fracture, dislocation, or joint effusion. No significant focal soft tissue swelling. No radiopaque foreign body. IMPRESSION: No acute fracture or dislocation of the right elbow. Electronically Signed   By: Hart Robinsons M.D.   On: 02/07/2023 14:15   DG Shoulder Right  Result Date: 02/07/2023 CLINICAL DATA:  Fall.  Shoulder pain. EXAM: RIGHT SHOULDER - 2+ VIEW COMPARISON:  None Available. FINDINGS: There is no evidence of fracture or dislocation. The glenohumeral and acromioclavicular joints are  anatomically aligned with degenerative changes. No obvious displaced right-sided rib fracture identified. Surrounding soft tissues are grossly unremarkable. IMPRESSION: No acute fracture or dislocation of the right shoulder. Electronically Signed   By: Hart Robinsons M.D.   On: 02/07/2023 14:13   DG Hip Unilat W or Wo Pelvis 2-3 Views Right  Result Date: 02/07/2023 CLINICAL DATA:  AMS, fall, RLE shortening EXAM: DG HIP (WITH OR WITHOUT PELVIS) 2-3V RIGHT COMPARISON:  Right hip radiographs dated September 30, 2018. FINDINGS: There is a comminuted and impacted intertrochanteric fracture of the right proximal femur with approximately 9 mm of displacement of the lesser trochanter fracture fragment. The right femoral head is seated within the acetabulum. No additional fracture identified. Remote healed right superior and inferior pubic rami fractures. The left hip joint is anatomically aligned. The SI joints and pubic symphysis appear anatomically aligned. Diffuse osseous demineralization. IMPRESSION: Comminuted, impacted, and displaced intertrochanteric fracture of the right proximal femur. Electronically Signed   By: Hart Robinsons M.D.   On: 02/07/2023 10:20   DG Chest Port 1 View  Result Date: 02/07/2023 CLINICAL DATA:  AMS, fall, RLE shortening. EXAM: PORTABLE CHEST 1 VIEW COMPARISON:  Chest radiograph dated August 03, 2022. FINDINGS: Patient is rotated to the right. The heart and mediastinal contours are otherwise unchanged. Aortic calcification. No focal consolidation, pneumothorax, or sizable pleural effusion. No acute osseous abnormality. IMPRESSION: No acute findings in the chest. Electronically Signed   By: Hart Robinsons M.D.   On: 02/07/2023 10:13   CT Head Wo Contrast  Result Date: 02/07/2023 CLINICAL DATA:  Neck trauma (Age >= 65y); Head trauma, moderate-severe. Found unresponsive. Possible fall down stairs outside home. EXAM: CT HEAD WITHOUT CONTRAST CT CERVICAL SPINE WITHOUT CONTRAST  TECHNIQUE: Multidetector CT imaging of the head and cervical spine was performed following the standard protocol without intravenous contrast. Multiplanar CT image reconstructions of the cervical spine were also generated. RADIATION DOSE REDUCTION: This exam was performed according to the departmental dose-optimization program  which includes automated exposure control, adjustment of the mA and/or kV according to patient size and/or use of iterative reconstruction technique. COMPARISON:  Head CT 08/03/2022. FINDINGS: CT HEAD FINDINGS Brain: No acute hemorrhage. Unchanged mild chronic small-vessel disease. Cortical gray-white differentiation is otherwise preserved. Prominence of the ventricles and sulci within expected range for age. No hydrocephalus or extra-axial collection. No mass effect or midline shift. Vascular: No hyperdense vessel or unexpected calcification. Skull: No calvarial fracture or suspicious bone lesion. Skull base is unremarkable. Sinuses/Orbits: No acute finding. Other: None. CT CERVICAL SPINE FINDINGS Alignment: Trace degenerative retrolisthesis of C5 on C6. No traumatic malalignment. Skull base and vertebrae: No acute fracture. Normal craniocervical junction. No suspicious bone lesions. Soft tissues and spinal canal: No prevertebral fluid or swelling. No visible canal hematoma. Disc levels: Multilevel cervical spondylosis, worst at C4-5 and C5-6, where there is at least moderate spinal canal stenosis. Upper chest: No acute findings. Other: Atherosclerotic calcifications of the carotid bulbs. IMPRESSION: 1. No acute intracranial abnormality. 2. No acute cervical spine fracture or traumatic malalignment. 3. Multilevel cervical spondylosis, worst at C4-5 and C5-6, where there is at least moderate spinal canal stenosis. Electronically Signed   By: Orvan Falconer M.D.   On: 02/07/2023 09:14   CT Cervical Spine Wo Contrast  Result Date: 02/07/2023 CLINICAL DATA:  Neck trauma (Age >= 65y); Head  trauma, moderate-severe. Found unresponsive. Possible fall down stairs outside home. EXAM: CT HEAD WITHOUT CONTRAST CT CERVICAL SPINE WITHOUT CONTRAST TECHNIQUE: Multidetector CT imaging of the head and cervical spine was performed following the standard protocol without intravenous contrast. Multiplanar CT image reconstructions of the cervical spine were also generated. RADIATION DOSE REDUCTION: This exam was performed according to the departmental dose-optimization program which includes automated exposure control, adjustment of the mA and/or kV according to patient size and/or use of iterative reconstruction technique. COMPARISON:  Head CT 08/03/2022. FINDINGS: CT HEAD FINDINGS Brain: No acute hemorrhage. Unchanged mild chronic small-vessel disease. Cortical gray-white differentiation is otherwise preserved. Prominence of the ventricles and sulci within expected range for age. No hydrocephalus or extra-axial collection. No mass effect or midline shift. Vascular: No hyperdense vessel or unexpected calcification. Skull: No calvarial fracture or suspicious bone lesion. Skull base is unremarkable. Sinuses/Orbits: No acute finding. Other: None. CT CERVICAL SPINE FINDINGS Alignment: Trace degenerative retrolisthesis of C5 on C6. No traumatic malalignment. Skull base and vertebrae: No acute fracture. Normal craniocervical junction. No suspicious bone lesions. Soft tissues and spinal canal: No prevertebral fluid or swelling. No visible canal hematoma. Disc levels: Multilevel cervical spondylosis, worst at C4-5 and C5-6, where there is at least moderate spinal canal stenosis. Upper chest: No acute findings. Other: Atherosclerotic calcifications of the carotid bulbs. IMPRESSION: 1. No acute intracranial abnormality. 2. No acute cervical spine fracture or traumatic malalignment. 3. Multilevel cervical spondylosis, worst at C4-5 and C5-6, where there is at least moderate spinal canal stenosis. Electronically Signed   By:  Orvan Falconer M.D.   On: 02/07/2023 09:14    Positive ROS: All other systems have been reviewed and were otherwise negative with the exception of those mentioned in the HPI and as above.  Physical Exam: General:  Alert, no acute distress Psychiatric:  Patient is of questionable competence for consent, but exhibits normal mood and affect   Cardiovascular:  No pedal edema Respiratory:  No wheezing, non-labored breathing GI:  Abdomen is soft and non-tender Skin:  No lesions in the area of chief complaint Neurologic:  Sensation intact distally Lymphatic:  No  axillary or cervical lymphadenopathy  Orthopedic Exam:  Orthopedic examination is limited to the right hip and lower extremity.  The right lower extremity is somewhat shortened and externally rotated as compared to the left.  Skin inspection around the right hip is unremarkable.  No swelling, erythema, ecchymosis, abrasions, or other skin abnormalities are identified.  She has mild tenderness to palpation over the lateral aspect of the right hip.  She has more severe pain with any attempted active or passive motion of the hip.  She is able to dorsiflex and plantarflex her toes and ankle.  Sensations intact light touch to all distributions.  She has good capillary refill to her right foot.  X-rays:  X-rays of the pelvis and right hip are available for review and have been reviewed by myself.  The findings are as described above.  The hip joint itself appears to be well-maintained and without evidence for significant degenerative changes.  Assessment: Displaced intertrochanteric right hip fracture.  Plan: The treatment options, including both surgical and nonsurgical choices, have been discussed in detail with the patient and her family.  The patient and her family would like to proceed with surgical intervention to include an intramedullary nailing of the displaced intertrochanteric fracture of her right hip.  The risks (including bleeding,  infection, nerve and/or blood vessel injury, persistent or recurrent pain, loosening or failure of the components, leg length inequality, dislocation, need for further surgery, blood clots, strokes, heart attacks or arrhythmias, pneumonia, etc.) and benefits of the surgical procedure were discussed.  The patient and her family state their understanding and agree to proceed.  She agrees to a blood transfusion if necessary.  A formal written consent will be obtained by the nursing staff.  Thank you for asking me to participate in the care of this most delightful woman.  I will be happy to follow her with you.   Maryagnes Amos, MD  Beeper #:  [phone]  02/07/2023 4:14 PM

## 2023-02-07 NOTE — Transfer of Care (Signed)
Immediate Anesthesia Transfer of Care Note  Patient: Fran Lowes Gavina  Procedure(s) Performed: Procedure(s): INTRAMEDULLARY (IM) NAIL INTERTROCHANTERIC (Right)  Patient Location: PACU  Anesthesia Type:General  Level of Consciousness: sedated  Airway & Oxygen Therapy: Patient Spontanous Breathing and Patient connected to face mask oxygen  Post-op Assessment: Report given to RN and Post -op Vital signs reviewed and stable  Post vital signs: Reviewed and stable  Last Vitals:  Vitals:   02/07/23 1546 02/07/23 1803  BP: 100/84 137/73  Pulse: 93 73  Resp: (!) 21 19  Temp:  36.5 C  SpO2: 96% 100%    Complications: No apparent anesthesia complications

## 2023-02-07 NOTE — Progress Notes (Signed)
Called and received report from [facility]. Hand-off completed

## 2023-02-07 NOTE — Anesthesia Postprocedure Evaluation (Signed)
Anesthesia Post Note  Patient: Vanessa [name]  Procedure(s) Performed: INTRAMEDULLARY (IM) NAIL INTERTROCHANTERIC (Right)  Patient location during evaluation: PACU Anesthesia Type: General Level of consciousness: awake and alert Pain management: pain level controlled Vital Signs Assessment: post-procedure vital signs reviewed and stable Respiratory status: spontaneous breathing, nonlabored ventilation, respiratory function stable and patient connected to nasal cannula oxygen Cardiovascular status: blood pressure returned to baseline, stable and tachycardic (atrial fibrillation, tachycardic, as per preop. otherwise hemodynamically stable) Postop Assessment: no apparent nausea or vomiting Anesthetic complications: no   No notable events documented.   Last Vitals:  Vitals:   02/07/23 1832 02/07/23 1850  BP: (!) 210/183 90/78  Pulse: (!) 125 (!) 131  Resp: 19 (!) 22  Temp: (!) 36.1 C (!) 35.8 C  SpO2: 98% 91%    Last Pain:  Vitals:   02/07/23 1850  TempSrc: Bladder  PainSc: 0-No pain                 Corinda Gubler

## 2023-02-07 NOTE — Progress Notes (Signed)
Patient taken off the floor via bed by OR staff. No distress noted. vss

## 2023-02-07 NOTE — H&P (Signed)
NAME:  Vanessa [name], MRN:  [id number], DOB:  1935/03/04, LOS: 0 ADMISSION DATE:  02/07/2023 CONSULTATION DATE:  02/07/2023 REFERRING MD:  Sharman Cheek MD CHIEF COMPLAINT:  Unresponsive      History of Present Illness:  Patient is an 88 yo female who presented to the ED with family after being found unresponsive outside of her home early this morning around 7:15AM. Family reports the patient was last seen on cameras around 11PM last night. When she was found this morning, she was at the bottom on her front steps. At the time of EMS arrival, patient was unresponsive with an initial axillary temperature of 84 degrees and only responding to painful stimuli upon presentation. Family denies behavior changes prior to the event.  Patient found to have right intertrochanteric fracture of proximal femur also noted to have low blood pressure     ER COURSE Patient given 2 L IVF's Patient started on Vasopressors Started Bear Hugger fro hypothermia ABX given LA 7.4--6.7--5     Pertinent  Medical History  Chronic combined systolic and diastolic Heart Failure Atrial Fibrillation COPD Pulmonary Emphysema Diabetes Mellitus without complication Hypothyroidism Hypokalemia Anemia Depression   Significant Hospital Events: Including procedures, antibiotic start and stop dates in addition to other pertinent events   10/17 admitted  for hypothermia given Bear Hugger,started  levophed for hypotension. Ceftriaxone and doxycyline given in the ED, Orthopedics consulted for OR repair of right intertrochanteric fracture of proximal femur.         Micro Data:  10/17: Blood culture >> 10/17: Legionella >>   Antimicrobials:  Ceftriaxone 500mg  x 1 Doxycycline 100mg      Interim History / Subjective:  Patient given levophed in the ER but it is currently not needed for hypotension. Hypothermia improving since admission bear hugger Hemodynamic stabilization prior to surgery for right  intertrochanteric fracture of proximal femur. Mental status improving with fluids and increasing temperature.          Objective   Blood pressure 130/72, pulse 87, temperature (!) 92.2 F (33.4 C), resp. rate 20, height 5\' 4"  (1.626 m), weight 54.4 kg, SpO2 99%.    >         Intake/Output Summary (Last 24 hours) at 02/07/2023 1339 Last data filed at 02/07/2023 1232    Gross per 24 hour  Intake 3648.7 ml  Output --  Net 3648.7 ml       Filed Weights    02/07/23 0811  Weight: 54.4 kg      REVIEW OF SYSTEMS   PATIENT IS UNABLE TO PROVIDE COMPLETE REVIEW OF SYSTEMS DUE TO SEVERE CRITICAL ILLNESS    PHYSICAL EXAMINATION:   GENERAL: critically ill appearing, no respiratory distress EYES: Pupils equal, round, reactive to light.  No scleral icterus.  MOUTH: Moist mucosal membrane.  NECK: Supple.  PULMONARY: Lungs clear to auscultation, no rhonchi or wheezing CARDIOVASCULAR: S1 and S2.  Regular rate and rhythm GASTROINTESTINAL: Soft, nontender, -distended. Positive bowel sounds.  MUSCULOSKELETAL: No swelling, clubbing, or edema. TTP of right hip NEUROLOGIC: oriented to self only SKIN: normal with multiple abrasions, cool, Capillary refill delayed  Pulses present bilaterally     ASSESSMENT AND PLAN SYNOPSIS Patient is an 88 yo female with admitted for  significant for altered mental status, hypothermia, hypotension, mild acute kidney injury, hypokalemia, hyperglycemia and septic/hypovolumic shock with metabolic acidosis  Complicated by right intertrochanteric fracture of proximal femur requiring surgical repair. Hypotension requiring pressors     SEPTIC/Hypovolumic  SHOCK SOURCE- ?ASPIRATION  PNEUMONIA -use vasopressors to keep MAP>65 as indicated -follow ABG and LA -Lactic trending down, 7.4, 6.7, 5.1 -follow up cultures and consider repeating CXR if not improving -empiric ABX, given doxycyline and ceftriaxone -cautious IV fluid resuscitation       MSK -  INTERTROCHANTERIC FRACTURE OF RIGHT PROXIMAL FEMUR -Pt scheduled for surgery with Dr. Joice Lofts this afternoon for repair if hemodynamically stable -Control pain as needed       ACUTE KIDNEY INJURY/Renal Failure -continue Foley Catheter-assess need -Avoid nephrotoxic agents -Follow urine output, BMP -Cr 1.07 and CK 336, continue IVF and follow trends -Ensure adequate renal perfusion, optimize oxygenation -Renal dose medications     Intake/Output Summary (Last 24 hours) at 02/07/2023 1339 Last data filed at 02/07/2023 1232    Gross per 24 hour  Intake 3648.7 ml  Output --  Net 3648.7 ml      CARDIAC - ICU monitoring -Levophed as indicated for hypotension     PULMONARY - COPD -monitor for hypoxia -duonebs as indicated         NEUROLOGY -No acute need for sedation -Patient's mentation improving since initial presentation in the ED -monitor for mental status changes       INFECTIOUS DISEASE -continue antibiotics as prescribed -given ceftriaxone and doxycycline in the ED -follow up cultures -leukocytosis 20.2, will obtain PCT     ENDO - ICU hypoglycemic\Hyperglycemia protocol -check FSBS per protocol     GI GI PROPHYLAXIS as indicated, on protonix       NUTRITIONAL STATUS DIET-->NPO for now Constipation protocol as indicated     ELECTROLYTES -follow labs as needed -replace as needed -Hypokalemia, given potassium chloride -will monitor trends -pharmacy consultation and following     ACUTE ANEMIA- TRANSFUSE AS NEEDED CONSIDER TRANSFUSION  IF HGB<7 -Hemoglobin currently 10.0, monitor trends DVT PRX with TED/SCD's ONLY         Best practice (right click and "Reselect all SmartList Selections" daily)  Diet: NPO Pain/Anxiety/Delirium protocol (if indicated): No VAP protocol (if indicated): Not indicated DVT prophylaxis: SCD GI prophylaxis: PPI Glucose control:  SSI Yes Central venous access:  N/A Arterial line:  N/A Foley:  Yes, and it  is still needed Mobility:  bed rest  Code Status:  FULL Disposition:ICU   Labs   CBC: Last Labs     Recent Labs  Lab 02/07/23 0814  WBC 20.2*  NEUTROABS 17.1*  HGB 10.0*  HCT 31.1*  MCV 93.4  PLT 208        Basic Metabolic Panel: Last Labs     Recent Labs  Lab 02/07/23 0814  NA 136  K 3.0*  CL 99  CO2 22  GLUCOSE 460*  BUN 20  CREATININE 1.07*  CALCIUM 8.1*  MG 2.1      GFR: Estimated Creatinine Clearance: 32.4 mL/min (A) (by C-G formula based on SCr of 1.07 mg/dL (H)). Last Labs       Recent Labs  Lab 02/07/23 0814 02/07/23 1010 02/07/23 1224  WBC 20.2*  --   --   LATICACIDVEN 7.4* 6.7* 5.0*        Liver Function Tests: Last Labs     Recent Labs  Lab 02/07/23 0814  AST 37  ALT 17  ALKPHOS 37*  BILITOT 0.7  PROT 5.0*  ALBUMIN 2.8*      Last Labs     Recent Labs  Lab 02/07/23 0814  LIPASE 28      Last Labs  No results for input(s): "AMMONIA" in  the last 168 hours.     ABG Labs (Brief)  No results found for: "PHART", "PCO2ART", "PO2ART", "HCO3", "TCO2", "ACIDBASEDEF", "O2SAT"      Coagulation Profile: Last Labs     Recent Labs  Lab 02/07/23 0814  INR 1.3*        Cardiac Enzymes: Last Labs     Recent Labs  Lab 02/07/23 0814  CKTOTAL 336*        HbA1C: Last Labs         Hgb A1c MFr Bld  Date/Time Value Ref Range Status  08/03/2022 06:49 PM 6.5 (H) 4.8 - 5.6 % Final      Comment:      (NOTE) Pre diabetes:          5.7%-6.4%   Diabetes:              >6.4%   Glycemic control for   <7.0% adults with diabetes    07/19/2017 12:09 PM 5.9 4.6 - 6.5 % Final      Comment:      Glycemic Control Guidelines for People with Diabetes:Non Diabetic:  <6%Goal of Therapy: <7%Additional Action Suggested:  >8%         CBG: Last Labs     Recent Labs  Lab 02/07/23 1016  GLUCAP 408*          Past Medical History:  She,  has a past medical history of Anemia, Arthritis, Cataract, Chronic systolic CHF (congestive  heart failure) (HCC), COPD (chronic obstructive pulmonary disease) (HCC), Family history of adverse reaction to anesthesia, Glaucoma, History of pelvic fracture, Hyperglycemia, Hyperlipidemia, Hypothyroidism, Insomnia, Migraines, Osteopenia, PAF (paroxysmal atrial fibrillation) (HCC), Pulmonary hypertension (HCC), and Wears dentures.    Surgical History:         Past Surgical History:  Procedure Laterality Date   CATARACT EXTRACTION       CATARACT EXTRACTION W/PHACO Left 08/27/2016    Procedure: CATARACT EXTRACTION PHACO AND INTRAOCULAR LENS PLACEMENT (IOC)  Left;  Surgeon: Lockie Mola, MD;  Location: [facility];  Service: Ophthalmology;  Laterality: Left;   COLONOSCOPY   2012   REFRACTIVE SURGERY              Social History:   reports that she has quit smoking. Her smoking use included cigarettes. She has a 12.5 pack-year smoking history. She has never used smokeless tobacco. She reports that she does not drink alcohol and does not use drugs.    Family History:  Her family history includes Diabetes in her father; Migraines in her mother; Stroke in her mother. There is no history of Breast cancer or Colon cancer.    Allergies Allergies       Allergies  Allergen Reactions   Codeine        Doesn't remember reaction   Cortisone        Doesn't remember reaction   Decongestant [Pseudoephedrine Hcl] Other (See Comments)      Avoid decongestants due to glaucoma hx.     Donepezil        Prolonged Qt   Naproxen        Stomach issues   Simvastatin        aches        Home Medications         Prior to Admission medications   Medication Sig Start Date End Date Taking? Authorizing Provider  Apoaequorin (PREVAGEN EXTRA STRENGTH) 20 MG CAPS Take 20 mg by mouth in the morning.  Yes [provider]  aspirin EC 81 MG tablet Take 81 mg by mouth daily.     Yes [provider]  Cholecalciferol (VITAMIN D) 50 MCG (2000 UT) CAPS Take 1 capsule (2,000  Units total) by mouth daily. 10/03/18   Yes Joaquim Nam, MD  Cranberry 1000 MG CAPS Take by mouth daily.     Yes [provider]  furosemide (LASIX) 20 MG tablet Take 1 lasix a day.  But increase 2 tabs of lasix if AM weight is above 118 lbs. 11/26/22   Yes Joaquim Nam, MD  levothyroxine (SYNTHROID) 25 MCG tablet TAKE 1 TABLET BY MOUTH ONCE DAILY ON AN EMPTY STOMACH. WAIT 30 MINUTES BEFORE TAKING OTHER MEDS. 01/04/23   Yes Joaquim Nam, MD  pantoprazole (PROTONIX) 20 MG tablet Take 20 mg by mouth 2 (two) times daily before a meal. 11/27/22 11/27/23 Yes [provider]  Potassium 99 MG TABS Take 2 tablets (198 mg total) by mouth daily. 08/10/22   Yes Joaquim Nam, MD  latanoprost (XALATAN) 0.005 % ophthalmic solution Place 1 drop into both eyes at bedtime. Patient not taking: Reported on 02/07/2023       [provider]          DVT/GI PRX  assessed I Assessed the need for Labs I Assessed the need for Foley I Assessed the need for Central Venous Line Family Discussion when available I Assessed the need for Mobilization I made an Assessment of medications to be adjusted accordingly Safety Risk assessment completed   CASE DISCUSSED IN MULTIDISCIPLINARY ROUNDS WITH ICU TEAM         Critical Care Time devoted to patient care services described in this note is 65 minutes.    Critical care was necessary to treat /prevent imminent and life-threatening deterioration.     PATIENT WITH VERY POOR PROGNOSIS I ANTICIPATE PROLONGED ICU LOS   Patient is critically ill. Patient with Multiorgan failure and at high risk for cardiac arrest and death.      Lucie Leather, M.D.  [facility]  Medical Director [facility] Medical Director [facility] Cardio-Pulmonary Department

## 2023-02-07 NOTE — Consult Note (Signed)
PHARMACY CONSULT NOTE - ELECTROLYTES  Pharmacy Consult for Electrolyte Monitoring and Replacement   Recent Labs: Potassium (mmol/L)  Date Value  02/07/2023 3.0 (L)   Magnesium (mg/dL)  Date Value  16/01/9603 2.1   Calcium (mg/dL)  Date Value  54/12/8117 8.1 (L)   Albumin (g/dL)  Date Value  14/78/2956 2.8 (L)   Phosphorus (mg/dL)  Date Value  21/30/8657 5.0 (H)   Sodium (mmol/L)  Date Value  02/07/2023 136  01/08/2018 134   Height: 5\' 4"  (162.6 cm) Weight: 60.3 kg (132 lb 15 oz) IBW/kg (Calculated) : 54.7 Estimated Creatinine Clearance: 32.6 mL/min (A) (by C-G formula based on SCr of 1.07 mg/dL (H)).  Assessment  Vanessa [name] is a 88 y.o. female presenting with unresponsiveness after being found down outside after fall. PMH significant for Afib, hypothyroidism, osteopenia, COPD, dementia, CHF. Pharmacy has been consulted to monitor and replace electrolytes.  Diet: NPO MIVF: N/A Pertinent medications: Kcl 10 mEq IV q1h x 7 doses ordered so far for today  Goal of Therapy: Electrolytes within normal limits  Plan:  --No further electrolyte replacement indicated at this time --Follow-up electrolytes with AM labs tomorrow  Thank you for allowing pharmacy to be a part of this patient's care.  Tressie Ellis 02/07/2023 2:20 PM

## 2023-02-07 NOTE — Anesthesia Procedure Notes (Addendum)
Procedure Name: Intubation Date/Time: 02/07/2023 4:22 PM  Performed by: Morene Crocker, CRNAPre-anesthesia Checklist: Patient identified, Patient being monitored, Timeout performed, Emergency Drugs available and Suction available Patient Re-evaluated:Patient Re-evaluated prior to induction Oxygen Delivery Method: Circle system utilized Preoxygenation: Pre-oxygenation with 100% oxygen Induction Type: IV induction Ventilation: Mask ventilation without difficulty Laryngoscope Size: Mac, 3 and McGraph Grade View: Grade I Tube type: Oral Tube size: 6.5 mm Number of attempts: 1 Airway Equipment and Method: Stylet Placement Confirmation: ETT inserted through vocal cords under direct vision, positive ETCO2 and breath sounds checked- equal and bilateral Secured at: 21 cm Tube secured with: Tape Dental Injury: Teeth and Oropharynx as per pre-operative assessment

## 2023-02-07 NOTE — ED Notes (Signed)
Family at bedside. 

## 2023-02-07 NOTE — Consult Note (Signed)
NAME:  Vanessa [name], MRN:  [id number], DOB:  [birth year], LOS: 0 ADMISSION DATE:  02/07/2023 CONSULTATION DATE:  02/07/2023 REFERRING MD:  Sharman Cheek MD CHIEF COMPLAINT:  Unresponsive    History of Present Illness:  Patient is an 88 yo female who presented to the ED with family after being found unresponsive outside of her home early this morning around 7:15AM. Family reports the patient was last seen on cameras around 11PM last night. When she was found this morning, she was at the bottom on her front steps. At the time of EMS arrival, patient was unresponsive with an initial axillary temperature of 84 degrees and only responding to painful stimuli upon presentation. Family denies behavior changes prior to the event.  Patient found to have right intertrochanteric fracture of proximal femur also noted to have low blood pressure   ER COURSE Patient given 2 L IVF's Patient started on Vasopressors Started Bear Hugger fro hypothermia ABX given LA 7.4--6.7--5   Pertinent  Medical History  Chronic combined systolic and diastolic Heart Failure Atrial Fibrillation COPD Pulmonary Emphysema Diabetes Mellitus without complication Hypothyroidism Hypokalemia Anemia Depression  Significant Hospital Events: Including procedures, antibiotic start and stop dates in addition to other pertinent events   10/17 admitted  for hypothermia given Bear Hugger,started  levophed for hypotension. Ceftriaxone and doxycyline given in the ED, Orthopedics consulted for OR repair of right intertrochanteric fracture of proximal femur.     Micro Data:  10/17: Blood culture >> 10/17: Legionella >>  Antimicrobials:  Ceftriaxone 500mg  x 1 Doxycycline 100mg    Interim History / Subjective:  Patient given levophed in the ER but it is currently not needed for hypotension. Hypothermia improving since admission bear hugger Hemodynamic stabilization prior to surgery for right intertrochanteric fracture  of proximal femur. Mental status improving with fluids and increasing temperature.      Objective   Blood pressure 130/72, pulse 87, temperature (!) 92.2 F (33.4 C), resp. rate 20, height 5\' 4"  (1.626 m), weight 54.4 kg, SpO2 99%.        Intake/Output Summary (Last 24 hours) at 02/07/2023 1339 Last data filed at 02/07/2023 1232 Gross per 24 hour  Intake 3648.7 ml  Output --  Net 3648.7 ml   Filed Weights   02/07/23 0811  Weight: 54.4 kg    REVIEW OF SYSTEMS  PATIENT IS UNABLE TO PROVIDE COMPLETE REVIEW OF SYSTEMS DUE TO SEVERE CRITICAL ILLNESS   PHYSICAL EXAMINATION:  GENERAL: critically ill appearing, no respiratory distress EYES: Pupils equal, round, reactive to light.  No scleral icterus.  MOUTH: Moist mucosal membrane.  NECK: Supple.  PULMONARY: Lungs clear to auscultation, no rhonchi or wheezing CARDIOVASCULAR: S1 and S2.  Regular rate and rhythm GASTROINTESTINAL: Soft, nontender, -distended. Positive bowel sounds.  MUSCULOSKELETAL: No swelling, clubbing, or edema. TTP of right hip NEUROLOGIC: oriented to self only SKIN: normal with multiple abrasions, cool, Capillary refill delayed  Pulses present bilaterally   ASSESSMENT AND PLAN SYNOPSIS Patient is an 88 yo female with admitted for  significant for altered mental status, hypothermia, hypotension, mild acute kidney injury, hypokalemia, hyperglycemia and septic/hypovolumic shock with metabolic acidosis  Complicated by right intertrochanteric fracture of proximal femur requiring surgical repair. Hypotension requiring pressors   SEPTIC/Hypovolumic  SHOCK SOURCE- ?ASPIRATION PNEUMONIA -use vasopressors to keep MAP>65 as indicated -follow ABG and LA -Lactic trending down, 7.4, 6.7, 5.1 -follow up cultures and consider repeating CXR if not improving -empiric ABX, given doxycyline and ceftriaxone -cautious IV fluid resuscitation  MSK - INTERTROCHANTERIC FRACTURE OF RIGHT PROXIMAL FEMUR -Pt scheduled for  surgery with Dr. Joice Lofts this afternoon for repair if hemodynamically stable -Control pain as needed    ACUTE KIDNEY INJURY/Renal Failure -continue Foley Catheter-assess need -Avoid nephrotoxic agents -Follow urine output, BMP -Cr 1.07 and CK 336, continue IVF and follow trends -Ensure adequate renal perfusion, optimize oxygenation -Renal dose medications   Intake/Output Summary (Last 24 hours) at 02/07/2023 1339 Last data filed at 02/07/2023 1232 Gross per 24 hour  Intake 3648.7 ml  Output --  Net 3648.7 ml    CARDIAC - ICU monitoring -Levophed as indicated for hypotension   PULMONARY - COPD -monitor for hypoxia -duonebs as indicated     NEUROLOGY -No acute need for sedation -Patient's mentation improving since initial presentation in the ED -monitor for mental status changes    INFECTIOUS DISEASE -continue antibiotics as prescribed -given ceftriaxone and doxycycline in the ED -follow up cultures -leukocytosis 20.2, will obtain PCT   ENDO - ICU hypoglycemic\Hyperglycemia protocol -check FSBS per protocol   GI GI PROPHYLAXIS as indicated, on protonix    NUTRITIONAL STATUS DIET-->NPO for now Constipation protocol as indicated   ELECTROLYTES -follow labs as needed -replace as needed -Hypokalemia, given potassium chloride -will monitor trends -pharmacy consultation and following   ACUTE ANEMIA- TRANSFUSE AS NEEDED CONSIDER TRANSFUSION  IF HGB<7 -Hemoglobin currently 10.0, monitor trends DVT PRX with TED/SCD's ONLY     Best practice (right click and "Reselect all SmartList Selections" daily)  Diet: NPO Pain/Anxiety/Delirium protocol (if indicated): No VAP protocol (if indicated): Not indicated DVT prophylaxis: SCD GI prophylaxis: PPI Glucose control:  SSI Yes Central venous access:  N/A Arterial line:  N/A Foley:  Yes, and it is still needed Mobility:  bed rest  Code Status:  FULL Disposition:ICU  Labs   CBC: Recent Labs   Lab 02/07/23 0814  WBC 20.2*  NEUTROABS 17.1*  HGB 10.0*  HCT 31.1*  MCV 93.4  PLT 208    Basic Metabolic Panel: Recent Labs  Lab 02/07/23 0814  NA 136  K 3.0*  CL 99  CO2 22  GLUCOSE 460*  BUN 20  CREATININE 1.07*  CALCIUM 8.1*  MG 2.1   GFR: Estimated Creatinine Clearance: 32.4 mL/min (A) (by C-G formula based on SCr of 1.07 mg/dL (H)). Recent Labs  Lab 02/07/23 0814 02/07/23 1010 02/07/23 1224  WBC 20.2*  --   --   LATICACIDVEN 7.4* 6.7* 5.0*    Liver Function Tests: Recent Labs  Lab 02/07/23 0814  AST 37  ALT 17  ALKPHOS 37*  BILITOT 0.7  PROT 5.0*  ALBUMIN 2.8*   Recent Labs  Lab 02/07/23 0814  LIPASE 28   No results for input(s): "AMMONIA" in the last 168 hours.  ABG No results found for: "PHART", "PCO2ART", "PO2ART", "HCO3", "TCO2", "ACIDBASEDEF", "O2SAT"   Coagulation Profile: Recent Labs  Lab 02/07/23 0814  INR 1.3*    Cardiac Enzymes: Recent Labs  Lab 02/07/23 0814  CKTOTAL 336*    HbA1C: Hgb A1c MFr Bld  Date/Time Value Ref Range Status  08/03/2022 06:49 PM 6.5 (H) 4.8 - 5.6 % Final    Comment:    (NOTE) Pre diabetes:          5.7%-6.4%  Diabetes:              >6.4%  Glycemic control for   <7.0% adults with diabetes   07/19/2017 12:09 PM 5.9 4.6 - 6.5 % Final    Comment:  Glycemic Control Guidelines for People with Diabetes:Non Diabetic:  <6%Goal of Therapy: <7%Additional Action Suggested:  >8%     CBG: Recent Labs  Lab 02/07/23 1016  GLUCAP 408*     Past Medical History:  She,  has a past medical history of Anemia, Arthritis, Cataract, Chronic systolic CHF (congestive heart failure) (HCC), COPD (chronic obstructive pulmonary disease) (HCC), Family history of adverse reaction to anesthesia, Glaucoma, History of pelvic fracture, Hyperglycemia, Hyperlipidemia, Hypothyroidism, Insomnia, Migraines, Osteopenia, PAF (paroxysmal atrial fibrillation) (HCC), Pulmonary hypertension (HCC), and Wears dentures.    Surgical History:   Past Surgical History:  Procedure Laterality Date   CATARACT EXTRACTION     CATARACT EXTRACTION W/PHACO Left 08/27/2016   Procedure: CATARACT EXTRACTION PHACO AND INTRAOCULAR LENS PLACEMENT (IOC)  Left;  Surgeon: Lockie Mola, MD;  Location: [facility];  Service: Ophthalmology;  Laterality: Left;   COLONOSCOPY  2012   REFRACTIVE SURGERY       Social History:   reports that she has quit smoking. Her smoking use included cigarettes. She has a 12.5 pack-year smoking history. She has never used smokeless tobacco. She reports that she does not drink alcohol and does not use drugs.   Family History:  Her family history includes Diabetes in her father; Migraines in her mother; Stroke in her mother. There is no history of Breast cancer or Colon cancer.   Allergies Allergies  Allergen Reactions   Codeine     Doesn't remember reaction   Cortisone     Doesn't remember reaction   Decongestant [Pseudoephedrine Hcl] Other (See Comments)    Avoid decongestants due to glaucoma hx.     Donepezil     Prolonged Qt   Naproxen     Stomach issues   Simvastatin     aches     Home Medications  Prior to Admission medications   Medication Sig Start Date End Date Taking? Authorizing Provider  Apoaequorin (PREVAGEN EXTRA STRENGTH) 20 MG CAPS Take 20 mg by mouth in the morning.   Yes [provider]  aspirin EC 81 MG tablet Take 81 mg by mouth daily.   Yes [provider]  Cholecalciferol (VITAMIN D) 50 MCG (2000 UT) CAPS Take 1 capsule (2,000 Units total) by mouth daily. 10/03/18  Yes Joaquim Nam, MD  Cranberry 1000 MG CAPS Take by mouth daily.   Yes [provider]  furosemide (LASIX) 20 MG tablet Take 1 lasix a day.  But increase 2 tabs of lasix if AM weight is above 118 lbs. 11/26/22  Yes Joaquim Nam, MD  levothyroxine (SYNTHROID) 25 MCG tablet TAKE 1 TABLET BY MOUTH ONCE DAILY ON AN EMPTY STOMACH. WAIT 30 MINUTES BEFORE TAKING  OTHER MEDS. 01/04/23  Yes Joaquim Nam, MD  pantoprazole (PROTONIX) 20 MG tablet Take 20 mg by mouth 2 (two) times daily before a meal. 11/27/22 11/27/23 Yes [provider]  Potassium 99 MG TABS Take 2 tablets (198 mg total) by mouth daily. 08/10/22  Yes Joaquim Nam, MD  latanoprost (XALATAN) 0.005 % ophthalmic solution Place 1 drop into both eyes at bedtime. Patient not taking: Reported on 02/07/2023    [provider]       DVT/GI PRX  assessed I Assessed the need for Labs I Assessed the need for Foley I Assessed the need for Central Venous Line Family Discussion when available I Assessed the need for Mobilization I made an Assessment of medications to be adjusted accordingly Safety Risk assessment completed  CASE DISCUSSED IN MULTIDISCIPLINARY ROUNDS WITH ICU TEAM     Critical Care Time devoted to patient care services described in this note is 65 minutes.   Critical care was necessary to treat /prevent imminent and life-threatening deterioration.   PATIENT WITH VERY POOR PROGNOSIS I ANTICIPATE PROLONGED ICU LOS  Patient is critically ill. Patient with Multiorgan failure and at high risk for cardiac arrest and death.    Lucie Leather, M.D.  [facility]  Medical Director [facility] Medical Director [facility] Cardio-Pulmonary Department

## 2023-02-07 NOTE — Op Note (Signed)
02/07/2023  6:05 PM  Patient:   Vanessa [name]  Pre-Op Diagnosis:   Displaced four-part intertrochanteric right hip fracture.  Post-Op Diagnosis:   Same  Procedure:   Reduction and internal fixation of displaced intertrochanteric right hip fracture with Biomet Affixis TFN nail.  Surgeon:   Maryagnes Amos, MD  Assistant:   None  Anesthesia:   GET  Findings:   As above  Complications:   None  EBL:   100 cc  Fluids:   500 cc crystalloid  UOP:   175 cc  TT:   None  Drains:   None  Closure:   Staples  Implants:   Biomet Affixis 11 x 360 mm TFN with a 100 mm lag screw and two distal interlocking screws  Brief Clinical Note:   The patient is an 88 year old female who sustained the above-noted injury last evening when she apparently fell outside of her home. She was unable to get back inside was not discovered until this morning. She was brought to the emergency room where x-rays demonstrated the above-noted injury after vigorous resuscitation to improve her blood pressure and body temperature, the patient has been cleared medically and presents at this time for reduction and internal fixation of the displaced 4 part intertrochanteric right hip fracture.  Procedure:   The patient was brought into the operating room. After adequate general endotracheal intubation and anesthesia was obtained, the patient was lain in the supine position on the fracture table. The uninjured leg was placed in a flexed and abducted position while the injured lower extremity was placed in longitudinal traction. The fracture was reduced using longitudinal traction and internal rotation. The adequacy of reduction was verified fluoroscopically in AP and lateral projections and found to be satisfactory. The lateral aspects of the right hip and thigh were prepped with ChloraPrep solution before being draped sterilely. Preoperative antibiotics were administered. A timeout was performed to verify the appropriate  surgical site.   The greater trochanter was identified fluoroscopically and an approximately 6-7 cm incision made about 2-3 fingerbreadths above the tip of the greater trochanter. The incision was carried down through the subcutaneous tissues to expose the gluteal fascia. This was split the length of the incision, providing access to the tip of the trochanter. Under fluoroscopic guidance, a guidewire was drilled through the tip of the trochanter into the proximal metaphysis to the level of the lesser trochanter. After verifying its position fluoroscopically in AP and lateral projections, it was overreamed with the initial reamer to the depth of the lesser trochanter. A guidewire was passed down through the femoral canal to the supracondylar region. The adequacy of guidewire position was verified fluoroscopically in AP and lateral projections before the length of the guidewire within the canal was measured and found to be 375 mm. Therefore, a 360 mm length nail was selected. The guidewire was overreamed sequentially using the flexible reamers, beginning with a 10 mm reamer and progressing to a 12.5 mm reamer. This provided good cortical chatter. The 11 x 360 mm Biomet Affixis TFN rod was selected and advanced to the appropriate depth, as verified fluoroscopically.   The guide system for the lag screw was positioned and advanced through an approximately 2 cm stab incision over the lateral aspect of the proximal femur. The guidewire was drilled up through the trochanteric femoral nail and into the femoral neck to rest within 5 mm of subchondral bone. After verifying its position in the femoral neck and head in both AP  and lateral projections, the guidewire was measured and found to be optimally replicated by a 100 mm lag screw. The guidewire was overreamed to the appropriate depth before the lag screw was inserted and advanced to the appropriate depth as verified fluoroscopically in AP and lateral projections.  Tension was released from the right lower extremity and compression applied across the fracture site. The locking screw was advanced, then backed off a quarter turn to set the lag screw. Again the adequacy of hardware position and fracture reduction was verified fluoroscopically in AP and lateral projections and found to be excellent.  Attention was directed distally. Using the "perfect circle" technique, the leg and fluoroscopy machine were positioned appropriately. An approximately 1.5 cm stab incision was made over the skin at the appropriate point before the drill bit was advanced through the cortex and across the static hole of the nail. The appropriate length of the screw was determined before the 44 mm distal interlocking screw was positioned, then advanced and tightened securely. Given how comminuted and displaced the intertrochanteric fracture was, is felt best to place a second distal interlocking screw through the dynamic hole. Utilizing the same "perfect circle" technique, a second distal interlocking screw was placed measuring 48 mm. Again the adequacy of screw position was verified fluoroscopically in AP and lateral projections and found to be excellent.  The wounds were irrigated thoroughly with sterile saline solution before the abductor fascia was reapproximated using #0 Vicryl interrupted sutures. The subcutaneous tissues were closed using 2-0 Vicryl interrupted sutures. The skin was closed using staples. A solution of 20 cc of 0.5% Sensorcaine with epinephrine and 10 cc of Exparel was injected in and around all incisions. Sterile occlusive dressings were applied to all wounds before the patient was awakened, extubated, and returned to the recovery room in satisfactory condition after tolerating the procedure well.

## 2023-02-08 ENCOUNTER — Encounter: Payer: Self-pay | Admitting: Surgery

## 2023-02-08 ENCOUNTER — Other Ambulatory Visit: Payer: Self-pay

## 2023-02-08 ENCOUNTER — Inpatient Hospital Stay (HOSPITAL_COMMUNITY)
Admission: EM | Admit: 2023-02-08 | Discharge: 2023-02-08 | Disposition: A | Discharge status: Home / Self Care | Payer: PPO | Source: Home / Self Care | Attending: Internal Medicine | Admitting: Internal Medicine

## 2023-02-08 DIAGNOSIS — Z515 Encounter for palliative care: Secondary | ICD-10-CM | POA: Diagnosis not present

## 2023-02-08 DIAGNOSIS — R6521 Severe sepsis with septic shock: Secondary | ICD-10-CM | POA: Diagnosis not present

## 2023-02-08 DIAGNOSIS — S72141A Displaced intertrochanteric fracture of right femur, initial encounter for closed fracture: Secondary | ICD-10-CM | POA: Diagnosis not present

## 2023-02-08 DIAGNOSIS — A419 Sepsis, unspecified organism: Secondary | ICD-10-CM | POA: Diagnosis not present

## 2023-02-08 DIAGNOSIS — R9431 Abnormal electrocardiogram [ECG] [EKG]: Secondary | ICD-10-CM

## 2023-02-08 DIAGNOSIS — T68XXXA Hypothermia, initial encounter: Secondary | ICD-10-CM | POA: Diagnosis not present

## 2023-02-08 LAB — GLUCOSE, CAPILLARY
Glucose-Capillary: 93 mg/dL (ref 70–99)
Glucose-Capillary: 81 mg/dL (ref 70–99)
Glucose-Capillary: 159 mg/dL — ABNORMAL HIGH (ref 70–99)
Glucose-Capillary: 82 mg/dL (ref 70–99)
Glucose-Capillary: 140 mg/dL — ABNORMAL HIGH (ref 70–99)
Glucose-Capillary: 136 mg/dL — ABNORMAL HIGH (ref 70–99)

## 2023-02-08 LAB — CBC
HCT: 24.1 % — ABNORMAL LOW (ref 36.0–46.0)
Hemoglobin: 7.8 g/dL — ABNORMAL LOW (ref 12.0–15.0)
MCH: 30.1 pg (ref 26.0–34.0)
MCHC: 32.4 g/dL (ref 30.0–36.0)
MCV: 93.1 fL (ref 80.0–100.0)
Platelets: 169 10*3/uL (ref 150–400)
RBC: 2.59 MIL/uL — ABNORMAL LOW (ref 3.87–5.11)
RDW: 14.2 % (ref 11.5–15.5)
WBC: 16 10*3/uL — ABNORMAL HIGH (ref 4.0–10.5)
nRBC: 0 % (ref 0.0–0.2)

## 2023-02-08 LAB — MAGNESIUM: Magnesium: 1.7 mg/dL (ref 1.7–2.4)

## 2023-02-08 LAB — BASIC METABOLIC PANEL
Anion gap: 8 (ref 5–15)
BUN: 21 mg/dL (ref 8–23)
CO2: 22 mmol/L (ref 22–32)
Calcium: 8.2 mg/dL — ABNORMAL LOW (ref 8.9–10.3)
Chloride: 110 mmol/L (ref 98–111)
Creatinine, Ser: 1.08 mg/dL — ABNORMAL HIGH (ref 0.44–1.00)
GFR, Estimated: 50 mL/min — ABNORMAL LOW (ref >60)
Glucose, Bld: 96 mg/dL (ref 70–99)
Potassium: 4.2 mmol/L (ref 3.5–5.1)
Sodium: 140 mmol/L (ref 135–145)

## 2023-02-08 LAB — PHOSPHORUS: Phosphorus: 4.1 mg/dL (ref 2.5–4.6)

## 2023-02-08 LAB — LACTIC ACID, PLASMA: Lactic Acid, Venous: 2.1 mmol/L (ref 0.5–1.9)

## 2023-02-08 MED ORDER — ALPRAZOLAM 0.25 MG PO TABS
0.2500 mg | ORAL_TABLET | Freq: Once | ORAL | Status: AC
  Administered 2023-02-08: 0.25 mg via ORAL
  Filled 2023-02-08: qty 1

## 2023-02-08 MED ORDER — METOPROLOL TARTRATE 5 MG/5ML IV SOLN: 5.0000 mg | INTRAVENOUS | Status: DC | PRN

## 2023-02-08 MED ORDER — METOPROLOL TARTRATE 5 MG/5ML IV SOLN
2.5000 mg | INTRAVENOUS | Status: DC | PRN
  Administered 2023-02-08: 2.5 mg via INTRAVENOUS

## 2023-02-08 MED ORDER — LACTATED RINGERS IV BOLUS
500.0000 mL | Freq: Once | INTRAVENOUS | Status: AC
  Administered 2023-02-08: 500 mL via INTRAVENOUS

## 2023-02-08 MED ORDER — MELATONIN 5 MG PO TABS
2.5000 mg | ORAL_TABLET | Freq: Every evening | ORAL | Status: DC | PRN
  Administered 2023-02-08 – 2023-02-14 (×4): 2.5 mg via ORAL
  Filled 2023-02-08 (×4): qty 1

## 2023-02-08 MED ORDER — METOPROLOL TARTRATE 5 MG/5ML IV SOLN
INTRAVENOUS | Status: AC
  Filled 2023-02-08: qty 5

## 2023-02-08 MED ORDER — MIDODRINE HCL 5 MG PO TABS
10.0000 mg | ORAL_TABLET | Freq: Three times a day (TID) | ORAL | Status: DC
  Administered 2023-02-08 – 2023-02-15 (×18): 10 mg via ORAL
  Filled 2023-02-08 (×18): qty 2

## 2023-02-08 MED ORDER — MAGNESIUM SULFATE 2 GM/50ML IV SOLN
2.0000 g | Freq: Once | INTRAVENOUS | Status: AC
  Administered 2023-02-08: 2 g via INTRAVENOUS
  Filled 2023-02-08: qty 50

## 2023-02-08 MED ORDER — FUROSEMIDE 20 MG PO TABS: 20.0000 mg | ORAL_TABLET | Freq: Every day | ORAL | Status: DC

## 2023-02-08 MED ORDER — METOPROLOL SUCCINATE ER 50 MG PO TB24
25.0000 mg | ORAL_TABLET | Freq: Two times a day (BID) | ORAL | Status: DC
  Administered 2023-02-08: 25 mg via ORAL
  Filled 2023-02-08: qty 1

## 2023-02-08 NOTE — Progress Notes (Signed)
NAME:  Vanessa [name], MRN:  [id number], DOB:  1935/01/11, LOS: 1 ADMISSION DATE:  02/07/2023 CONSULTATION DATE:  02/08/2023 REFERRING MD:  Sharman Cheek MD CHIEF COMPLAINT:  Unresponsive    History of Present Illness:  Patient is an 88 yo female who presented to the ED with family after being found unresponsive outside of her home early 02/07/23. Family reports the patient was last seen on cameras around 11PM the previous night. She was found at the bottom on her front steps. At the time of EMS arrival, patient was unresponsive with an initial axillary temperature of 84 degrees and only responding to painful stimuli upon presentation. Family denies behavior changes prior to the event.  Patient found to have right intertrochanteric fracture of proximal femur also noted to have low blood pressure.   ER COURSE Patient given 2 L IVF's Patient started on Vasopressors Started Bear Hugger fro hypothermia ABX given LA 7.4--6.7--5  Pertinent  Medical History  Chronic combined systolic and diastolic Heart Failure Atrial Fibrillation COPD Pulmonary Emphysema Diabetes Mellitus without complication Hypothyroidism Hypokalemia Anemia Depression  Significant Hospital Events: Including procedures, antibiotic start and stop dates in addition to other pertinent events   10/17: admitted for hypothermia given Bear Hugger, started levophed for hypotension. Ceftriaxone and doxycyline given in the ED, Orthopedics consulted for OR repair of right intertrochanteric fracture of proximal femur. 10/18: Patient taken to the OR yesterday for surgical correction of right hip fracture. Patient hypotensive today although remains off of pressors given PMH of hypotension at baseline per family. Pain management. ABX include cefazolin and ceftriaxone.   Micro Data:  10/17: Blood culture >> no growth  10/17: Legionella >> 10/17: COVID >> negative 10/17: MRSA PCR >>> negative  Antimicrobials:  Ceftriaxone  500mg  x 1 Doxycycline 100mg  Cefazolin 2g    Interim History / Subjective:  Patient previously given levophed but not currently needed for hypotension. Hypothermia improving since admission due to bear hugger and IVF resuscitation. Surgical correction of right intertrochanteric fracture of proximal femur 10/17.  Mental status back to baseline per family. Patient started on 1L [place] given mild O2 desats   Objective   Blood pressure (!) 93/48, pulse (!) 59, temperature 98.6 F (37 C), resp. rate 13, height 5\' 4"  (1.626 m), weight 59.7 kg, SpO2 96%.       Intake/Output Summary (Last 24 hours) at 02/08/2023 0814 Last data filed at 02/08/2023 0400 Gross per 24 hour  Intake 5521.54 ml  Output 490 ml  Net 5031.54 ml   Filed Weights   02/07/23 0811 02/07/23 1415 02/08/23 0330  Weight: 54.4 kg 60.3 kg 59.7 kg    REVIEW OF SYSTEMS  PATIENT IS UNABLE TO PROVIDE COMPLETE REVIEW OF SYSTEMS DUE TO SEVERE CRITICAL ILLNESS   PHYSICAL EXAMINATION:  GENERAL: no respiratory distress, resting comfortably, alert and oriented EYES: Pupils equal, round, reactive to light.  No scleral icterus.  MOUTH: Moist mucosal membrane.  NECK: Supple.  PULMONARY: Lungs clear to auscultation, no rhonchi or wheezing CARDIOVASCULAR: S1 and S2.  Tachycardic and normal rhythm. Murmur present GASTROINTESTINAL: Soft, nontender, -distended. Positive bowel sounds.  MUSCULOSKELETAL: No swelling, clubbing, or edema.  NEUROLOGIC: A&O x 4 SKIN: normal with multiple abrasions, warm, Capillary refill delayed  Pulses present bilaterally   Labs/imaging that I have personally reviewed  (right click and "Reselect all SmartList Selections" daily)    ASSESSMENT AND PLAN SYNOPSIS Patient is an 88 yo female with admitted for  significant for altered mental status, hypothermia, hypotension, mild acute  kidney injury, hypokalemia, hyperglycemia and septic/hypovolumic shock with metabolic acidosis  Complicated by right  intertrochanteric fracture of proximal femur requiring surgical repair. Hypotension requiring pressors.     SEPTIC/Hypovolemic SHOCK SOURCE- ?ASPIRATION PNEUMONIA -use vasopressors to keep MAP>65 as indicated -follow ABG and LA -Lactic trending down, 7.4, 6.7, 5.1, 2.1 -WBC trending down -follow up cultures and consider repeating CXR if not improving -empiric ABX, cefazolin pre & post-operatively -IV fluid resuscitation     MSK - INTERTROCHANTERIC FRACTURE OF RIGHT PROXIMAL FEMUR -Surgical repair 10/17 by Dr. Joice Lofts  -Control pain as needed -PT consult     ACUTE KIDNEY INJURY/Renal Failure -continue Foley Catheter-assess need -Avoid nephrotoxic agents -Follow urine output, BMP -Cr 1.07, 1.08, continue IVF and follow trends -Ensure adequate renal perfusion, optimize oxygenation -Renal dose medications    Intake/Output Summary (Last 24 hours) at 02/08/2023 0814 Last data filed at 02/08/2023 0400 Gross per 24 hour  Intake 5521.54 ml  Output 490 ml  Net 5031.54 ml   CARDIAC FAILURE- HFpEF -Oxygen as needed -cardiology consult -rate control as needed given hx of afib   CARDIAC -  ICU monitoring -Levophed as indicated for hypotension -Consult cardiology given hx of heart failure     PULMONARY - COPD -monitor for hypoxia -duonebs as indicated -currently on 1L [place]   NEUROLOGY -No acute need for sedation -Patient's mentation back to baseline per family -monitor for mental status changes   INFECTIOUS DISEASE -continue antibiotics as prescribed -Cefazolin 2g pre & post-operatively -follow up cultures -leukocytosis 20.2, 16.0 -PCT WNL     ENDO - ICU hypoglycemic\Hyperglycemia protocol -check FSBS per protocol     GI GI PROPHYLAXIS as indicated, on protonix     NUTRITIONAL STATUS DIET-->NPO for now Constipation protocol as indicated     ELECTROLYTES -follow labs as needed -replace as needed -Hypokalemia, replacement given and K now at  4.2 -Magnesium replaced -will monitor trends -pharmacy consultation and following     ACUTE ANEMIA- TRANSFUSE AS NEEDED CONSIDER TRANSFUSION  IF HGB<7 -Hemoglobin 10.0, 7.8 DVT PRX with TED/SCD's and SQ Lovenox      Best practice (right click and "Reselect all SmartList Selections" daily)  Diet: NPO Pain/Anxiety/Delirium protocol (if indicated): No VAP protocol (if indicated): Not indicated DVT prophylaxis: Systemic AC GI prophylaxis: PPI Glucose control:  SSI Yes Central venous access:  N/A Arterial line:  N/A Foley:  Yes, and it is still needed Mobility:  bed rest  Code Status:  FULL Disposition:ICU   Labs   CBC: Recent Labs  Lab 02/07/23 0814 02/07/23 1424 02/07/23 1924 02/08/23 0455  WBC 20.2* 15.8* 18.5* 16.0*  NEUTROABS 17.1*  --   --   --   HGB 10.0* 9.2* 9.0* 7.8*  HCT 31.1* 28.6* 28.1* 24.1*  MCV 93.4 92.3 92.4 93.1  PLT 208 176 200 169    Basic Metabolic Panel: Recent Labs  Lab 02/07/23 0814 02/07/23 1424 02/08/23 0455  NA 136 142 140  K 3.0* 3.5 4.2  CL 99 108 110  CO2 22 19* 22  GLUCOSE 460* 272* 96  BUN 20 18 21   CREATININE 1.07* 0.94 1.08*  CALCIUM 8.1* 7.7* 8.2*  MG 2.1  --  1.7  PHOS 5.0*  --  4.1   GFR: Estimated Creatinine Clearance: 32.3 mL/min (A) (by C-G formula based on SCr of 1.08 mg/dL (H)). Recent Labs  Lab 02/07/23 0814 02/07/23 1010 02/07/23 1224 02/07/23 1424 02/07/23 1924 02/08/23 0455  PROCALCITON 1.26  --   --   --   --   --  WBC 20.2*  --   --  15.8* 18.5* 16.0*  LATICACIDVEN 7.4*   < > 5.0* 5.9* 4.2* 2.1*   < > = values in this interval not displayed.    Liver Function Tests: Recent Labs  Lab 02/07/23 0814  AST 37  ALT 17  ALKPHOS 37*  BILITOT 0.7  PROT 5.0*  ALBUMIN 2.8*   Recent Labs  Lab 02/07/23 0814  LIPASE 28   No results for input(s): "[place]" in the last 168 hours.  ABG No results found for: "PHART", "PCO2ART", "PO2ART", "HCO3", "TCO2", "ACIDBASEDEF", "O2SAT"   Coagulation  Profile: Recent Labs  Lab 02/07/23 0814  INR 1.3*    Cardiac Enzymes: Recent Labs  Lab 02/07/23 0814  CKTOTAL 336*    HbA1C: Hgb A1c MFr Bld  Date/Time Value Ref Range Status  02/07/2023 08:14 AM 7.7 (H) 4.8 - 5.6 % Final    Comment:    (NOTE) Pre diabetes:          5.7%-6.4%  Diabetes:              >6.4%  Glycemic control for   <7.0% adults with diabetes   08/03/2022 06:49 PM 6.5 (H) 4.8 - 5.6 % Final    Comment:    (NOTE) Pre diabetes:          5.7%-6.4%  Diabetes:              >6.4%  Glycemic control for   <7.0% adults with diabetes     CBG: Recent Labs  Lab 02/07/23 1403 02/07/23 1607 02/07/23 1931 02/07/23 2344 02/08/23 0318  GLUCAP 245* 219* 221* 151* 93     Home Medications  Prior to Admission medications   Medication Sig Start Date End Date Taking? Authorizing Provider  Apoaequorin (PREVAGEN EXTRA STRENGTH) 20 MG CAPS Take 20 mg by mouth in the morning.   Yes [provider]  aspirin EC 81 MG tablet Take 81 mg by mouth daily.   Yes [provider]  Cholecalciferol (VITAMIN D) 50 MCG (2000 UT) CAPS Take 1 capsule (2,000 Units total) by mouth daily. 10/03/18  Yes Joaquim Nam, MD  Cranberry 1000 MG CAPS Take by mouth daily.   Yes [provider]  furosemide (LASIX) 20 MG tablet Take 1 lasix a day.  But increase 2 tabs of lasix if AM weight is above 118 lbs. 11/26/22  Yes Joaquim Nam, MD  levothyroxine (SYNTHROID) 25 MCG tablet TAKE 1 TABLET BY MOUTH ONCE DAILY ON AN EMPTY STOMACH. WAIT 30 MINUTES BEFORE TAKING OTHER MEDS. 01/04/23  Yes Joaquim Nam, MD  pantoprazole (PROTONIX) 20 MG tablet Take 20 mg by mouth 2 (two) times daily before a meal. 11/27/22 11/27/23 Yes [provider]  Potassium 99 MG TABS Take 2 tablets (198 mg total) by mouth daily. 08/10/22  Yes Joaquim Nam, MD  latanoprost (XALATAN) 0.005 % ophthalmic solution Place 1 drop into both eyes at bedtime. Patient not taking: Reported on  02/07/2023    [provider]     DVT/GI PRX  assessed I Assessed the need for Labs I Assessed the need for Foley I Assessed the need for Central Venous Line Family Discussion when available I Assessed the need for Mobilization I made an Assessment of medications to be adjusted accordingly Safety Risk assessment completed  CASE DISCUSSED IN MULTIDISCIPLINARY ROUNDS WITH ICU TEAM     Critical Care Time devoted to patient care services described in this note is 55 minutes.  Critical care was necessary to treat /prevent imminent and life-threatening deterioration.   PATIENT WITH VERY POOR PROGNOSIS I ANTICIPATE PROLONGED ICU LOS   Lucie Leather, M.D.  [facility]  Medical Director [facility] Medical Director [facility] Cardio-Pulmonary Department
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
## 2023-02-08 NOTE — Progress Notes (Signed)
Subjective: 1 Day Post-Op Procedure(s) (LRB): INTRAMEDULLARY (IM) NAIL INTERTROCHANTERIC (Right) Patient reports pain as mild.   Patient seen in rounds with Dr. Joice Lofts. Patient is well, and has had no acute complaints or problems. Denies any cp, sob, n/v, fevers or chills We will look to start therapy today pending cardiac stability. Patient has been running tachycardic with mild hypotension.  Plan is to go Rehab after hospital stay.  Objective: Vital signs in last 24 hours: Temp:  [85.6 F (29.8 C)-99.1 F (37.3 C)] 98.6 F (37 C) (10/18 0630) Pulse Rate:  [25-145] 59 (10/18 0630) Resp:  [10-29] 13 (10/18 0630) BP: (45-210)/(33-183) 93/48 (10/18 0630) SpO2:  [91 %-100 %] 96 % (10/18 0630) Weight:  [54.4 kg-60.3 kg] 59.7 kg (10/18 0330)  Intake/Output from previous day:  Intake/Output Summary (Last 24 hours) at 02/08/2023 0806 Last data filed at 02/08/2023 0400 Gross per 24 hour  Intake 5521.54 ml  Output 490 ml  Net 5031.54 ml    Intake/Output this shift: No intake/output data recorded.  Labs: Recent Labs    02/07/23 0814 02/07/23 1424 02/07/23 1924 02/08/23 0455  HGB 10.0* 9.2* 9.0* 7.8*   Recent Labs    02/07/23 1924 02/08/23 0455  WBC 18.5* 16.0*  RBC 3.04* 2.59*  HCT 28.1* 24.1*  PLT 200 169   Recent Labs    02/07/23 1424 02/08/23 0455  NA 142 140  K 3.5 4.2  CL 108 110  CO2 19* 22  BUN 18 21  CREATININE 0.94 1.08*  GLUCOSE 272* 96  CALCIUM 7.7* 8.2*   Recent Labs    02/07/23 0814  INR 1.3*    EXAM General - Patient is Alert, Appropriate, and Oriented Extremity - Neurologically intact ABD soft Neurovascular intact Sensation intact distally Intact pulses distally Dorsiflexion/Plantar flexion intact No cellulitis present Compartment soft Dressing - dressing C/D/I and no drainage Motor Function - intact, moving foot and toes well on exam. Able to plantar and dorsi flex with good strength and ROM. Has difficulty and pain with attempting  to SLR.  She is neurovascularly intact all dermatomes down her right lower extremity.  Posterior tibial pulses appreciated, 2+.  Past Medical History:  Diagnosis Date   Anemia    treated ~2009 with iron, resolved   Arthritis    Cataract    surgery on R catarct 2012   Chronic systolic CHF (congestive heart failure) (HCC)    a. TTE 2012: EF of 45-50%, mild diffuse HK, trivial AI, mild MR, mildly dilated RV with mild reduction of RVSF, mild to moderate TR, mild to moderate PR, mildly elevated PASP   COPD (chronic obstructive pulmonary disease) (HCC)    Family history of adverse reaction to anesthesia    Mother - altered mental status (long term)   Glaucoma    History of pelvic fracture    Hyperglycemia    mild inc in fasting sugar   Hyperlipidemia    Hypothyroidism    Insomnia    Migraines    without aura, sx since age 89   Osteopenia    prev with 10 years of fosamax and intolerant of evista   PAF (paroxysmal atrial fibrillation) (HCC)    a. initially noted 2012; b. CHADS2VASc at least 5 (CHF, age x 2, vasacular disease, female)   Pulmonary hypertension (HCC)    Wears dentures    full upper and lower    Assessment/Plan: 1 Day Post-Op Procedure(s) (LRB): INTRAMEDULLARY (IM) NAIL INTERTROCHANTERIC (Right) Principal Problem:   Septic shock (HCC)  Estimated body mass index is 22.59 kg/m as calculated from the following:   Height as of this encounter: 5\' 4"  (1.626 m).   Weight as of this encounter: 59.7 kg. Advance diet Up with therapy if possible.  Pending cardiac clearance  Discussed taking pain medications to help with pain control as well as before working with PT.   Hemoglobin at 7.8 today, down from 9.0 on 02/07/2023.  White count has dropped from 18.5 to 16.0 today.  Continue to monitor and trend these values.  May consider a transfusion if this hemoglobin drops lower than 7  Will look to get TOC involved to find rehab placement  Continue with Lovenox for DVT  prophylaxis  Weight-Bearing as tolerated to right leg  Patient will plan to follow-up with [facility] clinic orthopedics in 2 weeks for staple removal and evaluation   Rayburn Go, PA-C [facility] 02/08/2023, 8:06 AM

## 2023-02-08 NOTE — Evaluation (Addendum)
Physical Therapy Evaluation Patient Details Name: SADEEM COLLASO MRN: [id number] DOB: 10-19-1934 Today's Date: 02/08/2023  History of Present Illness  Pt is a 88 y.o. female who presented to the ED 02/07/23 with family after being found unresponsive outside of her home around 7:15AM. Family reports the Pt was last seen on cameras around 11PM the night before. When she was found this morning, she was at the bottom on her front steps. At the time of EMS arrival, patient was unresponsive with an initial axillary temperature of 84 degrees and only responding to painful stimuli upon presentation. Family denies behavior changes prior to the event. Pt found to have right intertrochanteric fracture of proximal femur also noted to have low blood pressure. R Intramedullary (IM) Nail Introtrochanteric sx done on 02/07/23.  Clinical Impression   Pt is received in bed with daughter [name] at bedside, she is agreeable to PT session. Pt's daughter assist with verification of subjective intake due to Pt not remembering what happened. At baseline, Pt reports amb independently without AD although has 3 RWs throughout bedroom/home should she need them, near 24/7 care between personal aides and family, and requiring assist with ADL/IADLs although she can self-feed. Pt performs bed mobility max-totalA due to R hip pain which increases with mobility and eases with rest. Unable to assess further mobility at this time due to elevated HR. VSS monitored throughout session and noted elevated HR of 142-149bpm with bed mobility/exercises and at rest 110-130bpm; SpO2 95-97% on 1L Brush. At end of session BP reading of 83/52 (64) with RN at bedside. Educated Pt and family on cont of ankle pumps but withholding against-gravity exercises until Cardiology consult to ensure safety-Pt and family verbalized understanding. Pt would benefit from skilled PT to address above deficits and promote optimal return to PLOF.      If plan is discharge  home, recommend the following: A lot of help with walking and/or transfers;A lot of help with bathing/dressing/bathroom;Direct supervision/assist for medications management;Assist for transportation;Help with stairs or ramp for entrance;Assistance with cooking/housework   Can travel by private vehicle        Equipment Recommendations None recommended by PT  Recommendations for Other Services       Functional Status Assessment Patient has had a recent decline in their functional status and demonstrates the ability to make significant improvements in function in a reasonable and predictable amount of time.     Precautions / Restrictions Precautions Precautions: Fall Precaution Comments: high BP Restrictions Weight Bearing Restrictions: Yes RLE Weight Bearing: Weight bearing as tolerated      Mobility  Bed Mobility Overal bed mobility: Needs Assistance Bed Mobility: Supine to Sit     Supine to sit: Max assist, HOB elevated, Total assist     General bed mobility comments: attempted bed mobility to achieve sitting EOB but limited due to R hip pain and generalized weakness requiring max-totalA for BLE management    Transfers                   General transfer comment: unable to assess at this time due to R hip pain and elevated HR during bed mobility    Ambulation/Gait               General Gait Details: unable to assess at this time due to R hip pain and elevated HR during bed mobility  Stairs            Wheelchair Mobility     Tilt  Bed    Modified Rankin (Stroke Patients Only)       Balance Overall balance assessment: Needs assistance     Sitting balance - Comments: unable to formally assess at this time due to R hip pain and elevated HR during bed mobility       Standing balance comment: unable to formally assess at this time due to R hip pain and elevated HR during bed mobility                             Pertinent  Vitals/Pain Pain Assessment Pain Assessment: Faces Faces Pain Scale: Hurts even more Pain Location: R hip with movement Pain Descriptors / Indicators: Sore, Operative site guarding, Guarding, Grimacing Pain Intervention(s): Limited activity within patient's tolerance, Monitored during session    Home Living Family/patient expects to be discharged to:: Private residence Living Arrangements: Alone Available Help at Discharge: Family;Personal care attendant;Available 24 hours/day (Personal aides hours are from 9AM-12PM with 2 hour break and 2-9 PM) Type of Home: House Home Access: Stairs to enter Entrance Stairs-Rails: Can reach both;Left;Right Entrance Stairs-Number of Steps: 1 STE front door; 5 steps out back   Home Layout: One level Home Equipment: Tub bench;Grab bars - tub/shower;Hand held shower head;BSC/3in1;Rolling Walker (2 wheels) Additional Comments: Pt's daughter [name] reports Pt has 3 RW placed outside room, at bedside, and in kitchen    Prior Function Prior Level of Function : Independent/Modified Independent             Mobility Comments: Daughter reports Pt amb without AD but has 3 RWs in the event she feels "unsteady"; no reports of falls in the last 6 months ADLs Comments: Personal aides assist with ADL/IADLs but Pt reports she can self-feed; Pt reports she does not drive     Extremity/Trunk Assessment   Upper Extremity Assessment Upper Extremity Assessment: Overall WFL for tasks assessed    Lower Extremity Assessment Lower Extremity Assessment: Generalized weakness       Communication   Communication Communication: No apparent difficulties Cueing Techniques: Verbal cues;Tactile cues  Cognition Arousal: Alert Behavior During Therapy: WFL for tasks assessed/performed Overall Cognitive Status: Within Functional Limits for tasks assessed                                 General Comments: AO x4; Pleasant and cooperative during therapy-daughter  at bedside to verify subjective intake        General Comments General comments (skin integrity, edema, etc.): VSS monitored throughout and noted elevated HR of 142-149bpm during bed mobility therefore discontinued further mobility, otherwise HR ranging 110-130bpm; SpO2% 95-97% on 1L  throughout session    Exercises Other Exercises Other Exercises: x3ea SLR, x3ea heel slides, x5 ankle pumps   Assessment/Plan    PT Assessment Patient needs continued PT services  PT Problem List Decreased strength;Decreased mobility;Decreased range of motion;Decreased activity tolerance;Decreased balance;Decreased cognition;Pain       PT Treatment Interventions DME instruction;Therapeutic exercise;Gait training;Stair training;Functional mobility training;Therapeutic activities;Balance training    PT Goals (Current goals can be found in the Care Plan section)  Acute Rehab PT Goals Patient Stated Goal: to go home PT Goal Formulation: With patient Time For Goal Achievement: 02/22/23 Potential to Achieve Goals: Good    Frequency BID     Co-evaluation               AM-PAC PT "  6 Clicks" Mobility  Outcome Measure Help needed turning from your back to your side while in a flat bed without using bedrails?: A Lot Help needed moving from lying on your back to sitting on the side of a flat bed without using bedrails?: A Lot Help needed moving to and from a bed to a chair (including a wheelchair)?: A Lot Help needed standing up from a chair using your arms (e.g., wheelchair or bedside chair)?: A Lot Help needed to walk in hospital room?: A Lot Help needed climbing 3-5 steps with a railing? : Total 6 Click Score: 11    End of Session Equipment Utilized During Treatment: Oxygen Activity Tolerance: Patient limited by pain Patient left: in bed;with call bell/phone within reach;with bed alarm set;with family/visitor present;with nursing/sitter in room Nurse Communication: Mobility status PT Visit  Diagnosis: Unsteadiness on feet (R26.81);Muscle weakness (generalized) (M62.81);Pain Pain - Right/Left: Right Pain - part of body: Hip    Time: 4098-1191 PT Time Calculation (min) (ACUTE ONLY): 15 min   Charges:                 Elmon Else, SPT   Shloime Keilman 02/08/2023, 9:59 AM

## 2023-02-08 NOTE — Consult Note (Signed)
Consultation Note Date: 02/08/2023 at 1100  Patient Name: Vanessa [name]  DOB: [birth year]  MRN: [id number]  Age / Sex: 88 y.o., female  PCP: Joaquim Nam, MD Referring Physician: Erin Fulling, MD  HPI/Patient Profile: 88 y.o. female  with past medical history of chronic combined systolic and diastolic HF, A-fib, COPD, pulmonary emphysema, type 2 diabetes, hypothyroidism, anemia, and depression admitted on 02/07/2023 with hypothermia after being found down outside of her home.   Patient found to have right intertrochanteric fracture of proximal femur.  10/17, patient underwent right hip IM nail placement.  Patient is hypotensive and tachycardic.  PMT was consulted to discuss goals of care  Clinical Assessment and Goals of Care: Extensive chart review completed prior to meeting patient including labs, vital signs, imaging, progress notes, orders, and available advanced directive documents from current and previous encounters. I then met with patient and her daughters Albin Felling and [name] at bedside to discuss diagnosis prognosis, GOC, EOL wishes, disposition and options.  I introduced Palliative Medicine as specialized medical care for people living with serious illness. It focuses on providing relief from the symptoms and stress of a serious illness. The goal is to improve quality of life for both the patient and the family.  We discussed a brief life review of the patient.  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  She has 4 children, 2 sons and 1 set of twin daughters.  They all live nearby and are heavily involved in her care.    She lives at home alone but has aides during the day.  She is only left alone at night after she goes to sleep and usually sleeps through the night until her aide comes in the morning.  We discussed patient's current illness and  what it means in the larger context of patient's on-going co-morbidities.  We reviewed imaging of patient's hiatal hernia.  Education provided on flank functional, nutritional, and cognitive status as significant indicators of patient's overall prognosis.    Family endorses patient is forgetful but does not have signs of moderate to severe dementia.  However, she remains confused that is state that they have not seen her in before.  Patient says she sees a reading of ice gaiters and does not know why people continue to ask her to lie.  She is confused and lacks capacity to make decisions for herself at this time.  Advance directives, concepts specific to code status, artificial feeding and hydration, and rehospitalization were considered and discussed. Patient was able to endorse that in the event that she is unable to speak for herself that she has named her daughter Albin Felling as her next of kin/HCPOA Management consultant.  Additionally, patient and family confirmed DNR with limited/DNI status.  I attempted to elicit values and goals important to the patient and family.  Family is prepared to have patient transferred to a rehab facility to get stronger.  They are concerned about her heart rate but  feel that the recent changes (metoprolol increased p.o.) are making a difference.  Endorsed significant confusion from yesterday to today.  In review of MAR, patient has just received IV morphine.  Discussed that this is for severe/breakthrough pain and should be held as a last resort.  Family was in agreement.  Discussed that if patient complains of pain then Norco should be given.  If patient becomes combative or confused, redirection and calming method should be used in place of narcotics.  Conveyed discussion to RN.  Discussed with patient/family the importance of continued conversation with family and the medical providers regarding overall plan of care and treatment options, ensuring decisions are within the  context of the patient's values and GOCs.    PMT will continue to follow and support patient and family throughout her hospitalization.  I conveyed that there is a decrease staff for PMT over the weekend and that I will see the patient upon my return on Monday.  If acute palliative needs arise, please utilize Amion for on-call providers.  Primary Decision Maker NEXT OF KIN  Physical Exam Vitals reviewed.  Constitutional:      General: She is not in acute distress.    Appearance: She is normal weight. She is not ill-appearing.  HENT:     Nose: Nose normal.     Mouth/Throat:     Mouth: Mucous membranes are moist.  Eyes:     Pupils: Pupils are equal, round, and reactive to light.  Cardiovascular:     Pulses: Normal pulses.  Pulmonary:     Effort: Pulmonary effort is normal.  Abdominal:     Palpations: Abdomen is soft.  Skin:    General: Skin is warm and dry.     Comments: UTA surgical site  Neurological:     Mental Status: She is alert.     Comments: Oriented to self and place  Psychiatric:        Mood and Affect: Mood normal.        Behavior: Behavior normal.     Palliative Assessment/Data: 50%     Thank you for this consult. Palliative medicine will continue to follow and assist holistically.   Time Total: 75 minutes  Time spent includes: Detailed review of medical records (labs, imaging, vital signs), medically appropriate exam (mental status, respiratory, cardiac, skin), discussed with treatment team, counseling and educating patient, family and staff, documenting clinical information, medication management and coordination of care.  Signed by: Georgiann Cocker, DNP, FNP-BC Palliative Medicine   Please contact Palliative Medicine Team providers via [facility] for questions and concerns.

## 2023-02-08 NOTE — Consult Note (Signed)
PHARMACY CONSULT NOTE - ELECTROLYTES  Pharmacy Consult for Electrolyte Monitoring and Replacement   Recent Labs: Potassium (mmol/L)  Date Value  02/08/2023 4.2   Magnesium (mg/dL)  Date Value  16/01/9603 1.7   Calcium (mg/dL)  Date Value  54/12/8117 8.2 (L)   Albumin (g/dL)  Date Value  14/78/2956 2.8 (L)   Phosphorus (mg/dL)  Date Value  21/30/8657 4.1   Sodium (mmol/L)  Date Value  02/08/2023 140  01/08/2018 134   Height: 5\' 4"  (162.6 cm) Weight: 59.7 kg (131 lb 9.8 oz) IBW/kg (Calculated) : 54.7 Estimated Creatinine Clearance: 32.3 mL/min (A) (by C-G formula based on SCr of 1.08 mg/dL (H)).  Assessment  Vanessa [name] is a 88 y.o. female presenting with unresponsiveness after being found down outside after fall. PMH significant for Afib, hypothyroidism, osteopenia, COPD, dementia, CHF. Pharmacy has been consulted to monitor and replace electrolytes.  Diet: PO MIVF: N/A  Goal of Therapy: Electrolytes within normal limits  Plan:  Mag 1.7 - magnesium sulfate 2 gm x 1  No further electrolyte replacement indicated at this time Follow-up electrolytes with AM labs tomorrow  Thank you for allowing pharmacy to be a part of this patient's care.  Littie Deeds, PharmD Pharmacy Resident  02/08/2023 7:12 AM

## 2023-02-08 NOTE — IPAL (Signed)
  Interdisciplinary Goals of Care Family Meeting   Date carried out: 02/08/2023  Location of the meeting: Bedside  Member's involved: Physician, Bedside Registered Nurse, and Family Member or next of kin   GOALS OF CARE DISCUSSION  The Clinical status was relayed to family in detail- Patient also part of conversation, Daughter at bedside  Updated and notified of patients medical condition- Explained to family course of therapy and the modalities   Patient with Progressive multiorgan failure with a very high probablity of a very minimal chance of meaningful recovery despite all aggressive and optimal medical therapy.    Family understands the situation.  Patient and Daughter have consented and agreed to DNR/DNI status  Family are satisfied with Plan of action and management. All questions answered  Additional CC time 35 mins   Vanessa [name] [name], M.D.  [facility]  Medical Director [facility] Medical Director [facility] Cardio-Pulmonary Department

## 2023-02-08 NOTE — Plan of Care (Signed)
Continuing with plan of care. 

## 2023-02-08 NOTE — Plan of Care (Signed)
  Problem: Education: Goal: Knowledge of General Education information will improve Description: Including pain rating scale, medication(s)/side effects and non-pharmacologic comfort measures Outcome: Progressing   Problem: Health Behavior/Discharge Planning: Goal: Ability to manage health-related needs will improve Outcome: Progressing   Problem: Clinical Measurements: Goal: Ability to maintain clinical measurements within normal limits will improve Outcome: Progressing Goal: Will remain free from infection Outcome: Progressing Goal: Diagnostic test results will improve Outcome: Progressing Goal: Respiratory complications will improve Outcome: Progressing Goal: Cardiovascular complication will be avoided Outcome: Progressing   Problem: Activity: Goal: Risk for activity intolerance will decrease Outcome: Progressing   Problem: Nutrition: Goal: Adequate nutrition will be maintained Outcome: Progressing   Problem: Coping: Goal: Level of anxiety will decrease Outcome: Progressing   Problem: Elimination: Goal: Will not experience complications related to bowel motility Outcome: Progressing Goal: Will not experience complications related to urinary retention Outcome: Progressing   Problem: Pain Managment: Goal: General experience of comfort will improve Outcome: Progressing   Problem: Safety: Goal: Ability to remain free from injury will improve Outcome: Progressing   Problem: Skin Integrity: Goal: Risk for impaired skin integrity will decrease Outcome: Progressing   Problem: Education: Goal: Ability to describe self-care measures that may prevent or decrease complications (Diabetes Survival Skills Education) will improve Outcome: Progressing   Problem: Coping: Goal: Ability to adjust to condition or change in health will improve Outcome: Progressing   Problem: Fluid Volume: Goal: Ability to maintain a balanced intake and output will improve Outcome:  Progressing   Problem: Health Behavior/Discharge Planning: Goal: Ability to identify and utilize available resources and services will improve Outcome: Progressing Goal: Ability to manage health-related needs will improve Outcome: Progressing   Problem: Metabolic: Goal: Ability to maintain appropriate glucose levels will improve Outcome: Progressing   Problem: Nutritional: Goal: Maintenance of adequate nutrition will improve Outcome: Progressing Goal: Progress toward achieving an optimal weight will improve Outcome: Progressing   Problem: Skin Integrity: Goal: Risk for impaired skin integrity will decrease Outcome: Progressing   Problem: Tissue Perfusion: Goal: Adequacy of tissue perfusion will improve Outcome: Progressing   

## 2023-02-08 NOTE — Plan of Care (Signed)
CHL Tonsillectomy/Adenoidectomy, Postoperative PEDS care plan entered in error.

## 2023-02-08 NOTE — Progress Notes (Signed)
PT Cancellation Note  Patient Details Name: Vanessa [name] MRN: [id number] DOB: Jan 11, 1935   Cancelled Treatment:    Reason Eval/Treat Not Completed: Medical issues which prohibited therapy. Pt presents with cont low BP and getting work up by medical team. Will re-attempt PT visit at a later time.   [name] 02/08/2023, 12:54 PM

## 2023-02-09 ENCOUNTER — Inpatient Hospital Stay: Payer: PPO

## 2023-02-09 DIAGNOSIS — A419 Sepsis, unspecified organism: Secondary | ICD-10-CM | POA: Diagnosis not present

## 2023-02-09 DIAGNOSIS — S72141A Displaced intertrochanteric fracture of right femur, initial encounter for closed fracture: Secondary | ICD-10-CM | POA: Diagnosis not present

## 2023-02-09 DIAGNOSIS — R6521 Severe sepsis with septic shock: Secondary | ICD-10-CM | POA: Diagnosis not present

## 2023-02-09 DIAGNOSIS — T68XXXA Hypothermia, initial encounter: Secondary | ICD-10-CM | POA: Diagnosis not present

## 2023-02-09 DIAGNOSIS — R579 Shock, unspecified: Secondary | ICD-10-CM | POA: Diagnosis not present

## 2023-02-09 LAB — HEMOGLOBIN AND HEMATOCRIT, BLOOD
HCT: 22.6 % — ABNORMAL LOW (ref 36.0–46.0)
HCT: 28.7 % — ABNORMAL LOW (ref 36.0–46.0)
Hemoglobin: 7.3 g/dL — ABNORMAL LOW (ref 12.0–15.0)
Hemoglobin: 9.2 g/dL — ABNORMAL LOW (ref 12.0–15.0)

## 2023-02-09 LAB — ECHOCARDIOGRAM COMPLETE
Height: 64 in
S' Lateral: 2.5 cm
Weight: 2105.83 [oz_av]

## 2023-02-09 LAB — GLUCOSE, CAPILLARY
Glucose-Capillary: 135 mg/dL — ABNORMAL HIGH (ref 70–99)
Glucose-Capillary: 135 mg/dL — ABNORMAL HIGH (ref 70–99)
Glucose-Capillary: 139 mg/dL — ABNORMAL HIGH (ref 70–99)
Glucose-Capillary: 127 mg/dL — ABNORMAL HIGH (ref 70–99)
Glucose-Capillary: 120 mg/dL — ABNORMAL HIGH (ref 70–99)

## 2023-02-09 LAB — CBC
HCT: 19.9 % — ABNORMAL LOW (ref 36.0–46.0)
Hemoglobin: 6.7 g/dL — ABNORMAL LOW (ref 12.0–15.0)
MCH: 30.6 pg (ref 26.0–34.0)
MCHC: 33.7 g/dL (ref 30.0–36.0)
MCV: 90.9 fL (ref 80.0–100.0)
Platelets: 152 10*3/uL (ref 150–400)
RBC: 2.19 MIL/uL — ABNORMAL LOW (ref 3.87–5.11)
RDW: 14.8 % (ref 11.5–15.5)
WBC: 12.2 10*3/uL — ABNORMAL HIGH (ref 4.0–10.5)
nRBC: 0 % (ref 0.0–0.2)

## 2023-02-09 LAB — BASIC METABOLIC PANEL
Anion gap: 7 (ref 5–15)
BUN: 27 mg/dL — ABNORMAL HIGH (ref 8–23)
CO2: 22 mmol/L (ref 22–32)
Calcium: 8.3 mg/dL — ABNORMAL LOW (ref 8.9–10.3)
Chloride: 108 mmol/L (ref 98–111)
Creatinine, Ser: 1.19 mg/dL — ABNORMAL HIGH (ref 0.44–1.00)
GFR, Estimated: 45 mL/min — ABNORMAL LOW (ref >60)
Glucose, Bld: 149 mg/dL — ABNORMAL HIGH (ref 70–99)
Potassium: 4.6 mmol/L (ref 3.5–5.1)
Sodium: 137 mmol/L (ref 135–145)

## 2023-02-09 LAB — PHOSPHORUS: Phosphorus: 4.4 mg/dL (ref 2.5–4.6)

## 2023-02-09 LAB — ABO/RH: ABO/RH(D): A NEG

## 2023-02-09 LAB — MAGNESIUM: Magnesium: 2.3 mg/dL (ref 1.7–2.4)

## 2023-02-09 LAB — LACTIC ACID, PLASMA: Lactic Acid, Venous: 1.1 mmol/L (ref 0.5–1.9)

## 2023-02-09 LAB — BRAIN NATRIURETIC PEPTIDE: B Natriuretic Peptide: 954.4 pg/mL — ABNORMAL HIGH (ref 0.0–100.0)

## 2023-02-09 LAB — PROCALCITONIN: Procalcitonin: 6.28 ng/mL

## 2023-02-09 LAB — PREPARE RBC (CROSSMATCH)

## 2023-02-09 MED ORDER — SODIUM CHLORIDE 0.9 % IV SOLN: 250.0000 mL | INTRAVENOUS | Status: AC

## 2023-02-09 MED ORDER — SODIUM CHLORIDE 0.9% IV SOLUTION: Freq: Once | INTRAVENOUS | Status: AC

## 2023-02-09 MED ORDER — ALPRAZOLAM 0.25 MG PO TABS
0.2500 mg | ORAL_TABLET | Freq: Once | ORAL | Status: AC
  Administered 2023-02-09: 0.25 mg via ORAL
  Filled 2023-02-09: qty 1

## 2023-02-09 MED ORDER — NOREPINEPHRINE 4 MG/250ML-% IV SOLN: 2.0000 ug/min | INTRAVENOUS | Status: DC

## 2023-02-09 MED ORDER — SODIUM CHLORIDE 0.9 % IV SOLN
3.0000 g | Freq: Four times a day (QID) | INTRAVENOUS | Status: DC
  Administered 2023-02-09 – 2023-02-12 (×12): 3 g via INTRAVENOUS
  Filled 2023-02-09 (×12): qty 8

## 2023-02-09 NOTE — Consult Note (Signed)
Pharmacy Antibiotic Note  Vanessa [name] is a 88 y.o. female admitted on 02/07/2023 with  fall c/b femur fracture s/p repair . Pharmacy has been consulted for Unasyn dosing for aspiration pneumonia.  Plan:  Unasyn 3 g IV q6h --Borderline renal dose adj.; if renal function worsens tomorrow may decrease frequency of administration  Height: 5\' 4"  (162.6 cm) Weight: 59.7 kg (131 lb 9.8 oz) IBW/kg (Calculated) : 54.7  Temp (24hrs), Avg:99.6 F (37.6 C), Min:99 F (37.2 C), Max:100 F (37.8 C)  Recent Labs  Lab 02/07/23 0814 02/07/23 1010 02/07/23 1224 02/07/23 1424 02/07/23 1924 02/08/23 0455 02/09/23 0631 02/09/23 0834  WBC 20.2*  --   --  15.8* 18.5* 16.0*  --  12.2*  CREATININE 1.07*  --   --  0.94  --  1.08* 1.19*  --   LATICACIDVEN 7.4*   < > 5.0* 5.9* 4.2* 2.1*  --  1.1   < > = values in this interval not displayed.    Estimated Creatinine Clearance: 29.3 mL/min (A) (by C-G formula based on SCr of 1.19 mg/dL (H)).    Allergies  Allergen Reactions   Codeine     Doesn't remember reaction   Cortisone     Doesn't remember reaction   Decongestant [Pseudoephedrine Hcl] Other (See Comments)    Avoid decongestants due to glaucoma hx.     Donepezil     Prolonged Qt   Naproxen     Stomach issues   Simvastatin     aches    Antimicrobials this admission: Ceftriaxone 10/17 x 1 Doxycycline 10/17 x 1 Cefazolin 10/17 >> 10/18 Unasyn 10/19 >>   Dose adjustments this admission: N/A  Microbiology results: 10/17 BCx: NGTD  Thank you for allowing pharmacy to be a part of this patient's care.  Tressie Ellis 02/09/2023 12:36 PM

## 2023-02-09 NOTE — Progress Notes (Signed)
Responded to page-family at bedside with inlaws. Pt was asleep but slowly woke while I was there. Offered compassionate presence as daughters at bedside expressed hardship watching the tragic decline in their mom. Offered prayer over family and pt. Family appreciative of chaplain support.

## 2023-02-09 NOTE — Progress Notes (Signed)
PT Cancellation Note  Patient Details Name: Vanessa [name] MRN: [id number] DOB: [birth year]   Cancelled Treatment:    Reason Eval/Treat Not Completed: Medical issues which prohibited therapy  HgB 6.7 this am.  Awaiting blood transfusion.  Given low lab results will defer to tomorrow after completed.   Danielle Dess 02/09/2023, 11:12 AM

## 2023-02-09 NOTE — Progress Notes (Signed)
Daily Progress Note   Patient Name: Vanessa [name]       Date: 02/09/2023 DOB: [birth year]  Age: 88 y.o. MRN#: [id number] Attending Physician: Pennie Banter, DO Primary Care Physician: Joaquim Nam, MD Admit Date: 02/07/2023  Reason for Consultation/Follow-up: Establishing goals of care  HPI/Brief Hospital Review: 88 y.o. female  with past medical history of chronic combined systolic and diastolic HF, A-fib, COPD, pulmonary emphysema, type 2 diabetes, hypothyroidism, anemia, and depression admitted on 02/07/2023 with hypothermia after being found down outside of her home.    Patient found to have right intertrochanteric fracture of proximal femur.  10/17, patient underwent right hip IM nail placement.   Patient is hypotensive and tachycardic.   PMT was consulted to discuss goals of care  Subjective: Extensive chart review has been completed prior to meeting patient including labs, vital signs, imaging, progress notes, orders, and available advanced directive documents from current and previous encounters.    Requested by ICU attending to visit with patient and family. Remains hypotensive and tachycardic. Had drastic change in presentation compared to yesterday. Drop in hemoglobin to 6.7 and somnolent presentation.  Visited with Vanessa [name] at her bedside. She is resting in bed, opens eyes to calling of her name but quickly falls back to sleep without redirection, able to squeeze hand.  Met with multiple family members throughout shift. Daughter-[name] as POA met with family earlier in AM after conversations had with primary team. Albin Felling shares at this time they wish to proceed with blood transfusion and IV antibiotics. Albin Felling shares they also want to focus on comfort and avoid  invasive testing or interventions.  Albin Felling wanted to discuss alternatives in the case of worsening decline or deterioration.  We also discussed comfort care, Vanessa [name] would no longer receive aggressive medical interventions such as continuous vital signs, lab work, radiology testing, or medications not focused on comfort. All care would focus on how the patient is looking and feeling. This would include management of any symptoms that may cause discomfort, pain, shortness of breath, cough, nausea, agitation, anxiety, and/or secretions etc. Symptoms would be managed with medications and other non-pharmacological interventions such as spiritual support if requested, repositioning, music therapy, or therapeutic listening. Family verbalized understanding and appreciation.  Albin Felling shares she would be open to transitioning to comfort care if  Vanessa [name] continues to decline. Albin Felling shares other family members are struggling with this decision so at this time wishes to continue with current plan of care.  Rounded at end of shift, hemoglobin improved post transfusion, slight improvement in BP and HR. Mentation remains the same, somnolent with intermittent agitation-managed well with as needed medications. Expressed ongoing concerns to family at bedside. Will revisit in AM to assess and continue goals of care conversations.   Care plan was discussed with primary team and nursing staff.  Thank you for allowing the Palliative Medicine Team to assist in the care of this patient.  Total time:  50 minutes  Time spent includes: Detailed review of medical records (labs, imaging, vital signs), medically appropriate exam (mental status, respiratory, cardiac, skin), discussed with treatment team, counseling and educating patient, family and staff, documenting clinical information, medication management and coordination of care.  Leeanne Deed, DNP, AGNP-C Palliative Medicine   Please contact Palliative Medicine  Team phone at [phone] for questions and concerns.

## 2023-02-09 NOTE — Progress Notes (Signed)
Patient noted to be fatigued early this morning, pale in color, and lethargic.  B/P soft this am along with heart rate in the 60s.  Dr. Denton Lank notified and came to bedside.  Orders received for 1 unit of PRBC, family will speak with palliative care before initiation of blood to confirm treatment plan moving forward and will update this RN who will update Dr. Denton Lank once confirmation received.

## 2023-02-09 NOTE — Progress Notes (Signed)
ORTHOPAEDICS PROGRESS NOTE  PATIENT NAME: Vanessa [name] DOB: 09/07/1934  MRN: [id number]  POD # 2: ORIF right Intertrochanteric femur fracture   Subjective: Patient lethargic. Resting comfortably. Family at bedside.  Objective: Vital signs in last 24 hours: Temp:  [98.4 F (36.9 C)-100 F (37.8 C)] 99.5 F (37.5 C) (10/19 0000) Pulse Rate:  [25-117] 70 (10/19 0600) Resp:  [12-36] 19 (10/19 0600) BP: (64-189)/(42-144) 103/71 (10/19 0600) SpO2:  [84 %-99 %] 97 % (10/19 0600) Weight:  [59.7 kg] 59.7 kg (10/19 0500)  Intake/Output from previous day: 10/18 0701 - 10/19 0700 In: 891.6 [P.O.:240; IV Piggyback:651.6] Out: 1025 [Urine:1025]  Recent Labs    02/07/23 0814 02/07/23 1424 02/07/23 1924 02/08/23 0455 02/09/23 0118 02/09/23 0631 02/09/23 0834  WBC 20.2* 15.8* 18.5* 16.0*  --   --  12.2*  HGB 10.0* 9.2* 9.0* 7.8* 7.3*  --  6.7*  HCT 31.1* 28.6* 28.1* 24.1* 22.6*  --  19.9*  PLT 208 176 200 169  --   --  152  K 3.0* 3.5  --  4.2  --  4.6  --   CL 99 108  --  110  --  108  --   CO2 22 19*  --  22  --  22  --   BUN 20 18  --  21  --  27*  --   CREATININE 1.07* 0.94  --  1.08*  --  1.19*  --   GLUCOSE 460* 272*  --  96  --  149*  --   CALCIUM 8.1* 7.7*  --  8.2*  --  8.3*  --   INR 1.3*  --   --   --   --   --   --     EXAM General: Frail appearing female in no apparent distress. Dressing dry and intact.  Assessment: ORIF right Intertrochanteric femur fracture   Plan: Hgb down to 6.7. Discussed with intensivist and transfusion has been ordered. Note from Palliative Care reviewed.  Continue to support the patient and family.  Neaveh Belanger P. Angie Fava M.D.

## 2023-02-09 NOTE — Progress Notes (Signed)
Progress Note   Patient: Vanessa [name] CBJ:[id number] DOB: 06-24-1934 DOA: 02/07/2023     2 DOS: the patient was seen and examined on 02/09/2023   Brief hospital course: 88 yo female who presented to the ED on 02/07/2023 with family after being found unresponsive outside of her home early that morning. Family reports the patient was last seen on cameras around 11PM the previous night. She was found at the bottom on her front steps. At the time of EMS arrival, patient was unresponsive with an initial axillary temperature of 84 degrees and only responding to painful stimuli upon presentation. Family denies behavior changes prior to the event.     In the ED, pt was found to have right intertrochanteric fracture of proximal femur and hypotension and lactic acidosis (LA 7.4>>6.7>>5), mild AKI, septic vs hypovolemic shock, metabolic acidosis, possible aspiration pneumonia.  She was given 2 L IVF's, Bair hugger for hypothermia, IV antibiotics, started on Levophed and IV antibiotics.  Admitted to ICU.   Orthopedic surgery was consulted and pt underwent ORIF with nail on evening of 10/17 with Dr. Joice Lofts.  10/18 - low BP's but off pressors. Family reported hx of baseline lower BP's as well.  Palliative care consulted.  10/19 - TR[facility] assumed care. Called to bedside early AM due to clinical deterioration with ongoing hypotension MAP's adequate.  Pt minimally responsive but would awaken, follow commands, no focal neurologic deficit.  Discussed guarded to poor prognosis with family at bedside, and potential options for aggressive management versus comfort approach.    Palliative met with family again as well.  Seen by Cardiology and family elected not to pursue advanced HF management, leaning towards comfort care.  Repeat Hbg 6.7, transfused 1 unit pRBC's.  Resumed on antibiotics for aspiration PNA.  Plan is 'watchful waiting' over coming hours to days.   Further hospital course and management as outlined  below.       Assessment and Plan:  Septic Shock - resolved. Due to aspiration PNA Presented hypotensive requiring pressor and initial lactic acid 7.4.  Lactate normalized 10/19 to 1.1  --Monitor fever curve, CBC, hemodynamics --Maintain MAP > 60-65 --Continue midodrine  Aspiration Pneumonia - in setting of fall and prolonged down time, underlying dementia but family deny signs of dysphagia. Repeat CXR 10/19 - R base consolidation and small pleural effusion.  Procal 6.28 (up from 1.26) --Resume antibiotics with Unasyn  Right Intertrochanteric Femur Fracture  --Surgical repair 10/17 by Dr. Joice Lofts  --Control pain as needed --PT consult --DVT ppx on hold due to anemia needing transfusion    Acute kidney injury Cr 1.19, up slightly from 1.07, 1.08 --Hold off IV fluids due to elevated BNP and 4 L positive fluid balance --Monitor urine output, BMP daily --Avoid nephrotoxic agents & renally dose meds --Maintain MAP 60-65+    Acute on Chronic HFpEF BNP 954 this AM, no overt pulmonary edema on CXR, small R pleural effusion Net IO Since Admission: 4,680.1 mL [02/09/23 1805] --Cardiology following --Echo pending --Hold Lasix for hypotension --Diuresis as BP allows  History of A-fib --Telemetry --Metoprolol for HR control as BP tolerates    Acute metabolic encephalopathy - due to hypothermia, septic shock Mentation had reportedly returned to baseline prior to clinical worsening today (10/19) --Continue to manage underlying issues as outlined --Address pain with least sedating meds possible  --Delirium precautions --Address bowel and bladder   Hypokalemia - resolved Monitor Bmp, Mg  Replace electrolytes PRN    Acute Blood Loss Anemia Hbg  6.7 this AM (from 10 >> ... 7.8 >> 7.3) No signs of bleeding at hip surgical site Check post-transfusion H&H Type & screen  Transfuse 1 unit pRBC's DVT prophylaxis on hold Serial Hbg's to monitor Transfuse for Hbg < 7   COPD -  stable, not exacerbated --monitor for hypoxia --Duonebs PRN        Subjective:  02/09/23 AM -- called to bedside early this AM to evaluate clinical deterioration overnight.  BP's low on max dose midodine, MAP was 60 during encounter at bedside. Pt reportedly agitated overnight, now resting comfortably.  She wakes to voice, acknowledges me, follows commands, has strong symmetric grip, she denies being in pain. Distal extremities are warm.  Surgical site looks good without signs of bleeding or infection.  Note BNP 954 this AM.  Echo yesterday not read yet.  CXR with R lung base consolidation and small right pleural effusion.  Getting repeat CBC, procal, repeat lactic acid.  Cardiology updated on status change.  Family at bedside with one other arriving soon.  Discussed prognosis is very guarded at this point.  They will discuss together, if pt were to need pressors would they want to level of care or transition to comfort measures.  Watchful waiting for now.  Hbg 6.7 -- stat T&S.  Transfuse 1 unit pRBC's Lactic acid normalized 1.1 WBC improved from 16 >> 12  Update Family decided against aggressive BP/CHF therapies, agreed to blood transfusion, antibiotics.  Want to continue to monitor & allow other family members to come see patient.  Low threshold to transition to comfort care if pt continues to decline or not improving.    Physical Exam: Vitals:   02/09/23 1600 02/09/23 1615 02/09/23 1620 02/09/23 1621  BP: (!) 99/59  (!) 115/55 (!) 115/55  Pulse: 61 64 (!) 54 64  Resp: 14 14 14 14   Temp: 98.4 F (36.9 C) 98.8 F (37.1 C) 98.2 F (36.8 C) 98.2 F (36.8 C)  TempSrc: Bladder   Bladder  SpO2: 94% 100% 100% 100%  Weight:      Height:       General exam: somnolent but wakes to voice, no acute distress HEENT: opens eyes to name, moist mucus membranes, hearing grossly normal  Respiratory system: CTAB diminished right base, no wheezes, normal respiratory effort. Cardiovascular  system: normal S1/S2, RRR, +systolic murmur, no pedal edema, +JVP Gastrointestinal system: soft, NT, ND, no HSM felt, +bowel sounds. Central nervous system: linited exam due to pt condition, wakes to voice, follows commands, symmetric 5/5 grip and upper extremities Extremities: right lateral hip dressings clean dry intact without signs of signficant bleeding, swelling, erythema, SCD's on BLE's Skin: dry, intact, normal temperature, pale appearing Psychiatry: unable to assess due to somnolence   Data Reviewed:  Notable labs --  WBC imprvoed 12.2, Hbg 6.7 >> 9.2 post-transfusion, normalized LA 1.1, procal increased to 6.28, glucose 149, BUN 27, Cr 1.19, Ca 8.3, GFR 45  Family Communication: multiple at bedside on rounds this AM.  Phone update with daughter this afternoon.  Disposition: Status is: Inpatient Remains inpatient appropriate because: severity of illness as above   Planned Discharge Destination: TBD     Time spent: 75 minutes including time spent at bedside and in coordination of care   Author: Pennie Banter, DO 02/09/2023 5:53 PM  For on call review [email or web address].

## 2023-02-09 NOTE — Plan of Care (Signed)
Continuing with plan of care. 

## 2023-02-09 NOTE — Consult Note (Signed)
PHARMACY CONSULT NOTE - ELECTROLYTES  Pharmacy Consult for Electrolyte Monitoring and Replacement   Recent Labs: Potassium (mmol/L)  Date Value  02/09/2023 4.6   Magnesium (mg/dL)  Date Value  96/29/5284 2.3   Calcium (mg/dL)  Date Value  13/24/4010 8.3 (L)   Albumin (g/dL)  Date Value  27/25/3664 2.8 (L)   Phosphorus (mg/dL)  Date Value  40/34/7425 4.4   Sodium (mmol/L)  Date Value  02/09/2023 137  01/08/2018 134   Height: 5\' 4"  (162.6 cm) Weight: 59.7 kg (131 lb 9.8 oz) IBW/kg (Calculated) : 54.7 Estimated Creatinine Clearance: 29.3 mL/min (A) (by C-G formula based on SCr of 1.19 mg/dL (H)).  Assessment  Vanessa [name] is a 88 y.o. female presenting with unresponsiveness after being found down outside after fall. PMH significant for Afib, hypothyroidism, osteopenia, COPD, dementia, CHF. Pharmacy has been consulted to monitor and replace electrolytes.  Diet: PO MIVF: N/A  Goal of Therapy: Electrolytes within normal limits  Plan:  No replacement needed F/u with AM labs.   Thank you for allowing pharmacy to be a part of this patient's care.  Ronnald Ramp, PharmD 02/09/2023 8:55 AM

## 2023-02-09 NOTE — Plan of Care (Signed)
  Problem: Education: Goal: Knowledge of General Education information will improve Description: Including pain rating scale, medication(s)/side effects and non-pharmacologic comfort measures Outcome: Not Progressing   Problem: Health Behavior/Discharge Planning: Goal: Ability to manage health-related needs will improve Outcome: Not Progressing   Problem: Clinical Measurements: Goal: Ability to maintain clinical measurements within normal limits will improve Outcome: Not Progressing Goal: Will remain free from infection Outcome: Not Progressing Goal: Diagnostic test results will improve Outcome: Not Progressing Goal: Respiratory complications will improve Outcome: Not Progressing Goal: Cardiovascular complication will be avoided Outcome: Not Progressing   Problem: Activity: Goal: Risk for activity intolerance will decrease Outcome: Not Progressing   Problem: Nutrition: Goal: Adequate nutrition will be maintained Outcome: Not Progressing   Problem: Coping: Goal: Level of anxiety will decrease Outcome: Not Progressing   Problem: Elimination: Goal: Will not experience complications related to bowel motility Outcome: Not Progressing Goal: Will not experience complications related to urinary retention Outcome: Not Progressing   Problem: Pain Managment: Goal: General experience of comfort will improve Outcome: Not Progressing   Problem: Safety: Goal: Ability to remain free from injury will improve Outcome: Not Progressing   Problem: Skin Integrity: Goal: Risk for impaired skin integrity will decrease Outcome: Not Progressing   Problem: Education: Goal: Ability to describe self-care measures that may prevent or decrease complications (Diabetes Survival Skills Education) will improve Outcome: Not Progressing   Problem: Coping: Goal: Ability to adjust to condition or change in health will improve Outcome: Not Progressing   Problem: Fluid Volume: Goal: Ability to  maintain a balanced intake and output will improve Outcome: Not Progressing   Problem: Health Behavior/Discharge Planning: Goal: Ability to identify and utilize available resources and services will improve Outcome: Not Progressing Goal: Ability to manage health-related needs will improve Outcome: Not Progressing   Problem: Metabolic: Goal: Ability to maintain appropriate glucose levels will improve Outcome: Not Progressing   Problem: Nutritional: Goal: Maintenance of adequate nutrition will improve Outcome: Not Progressing Goal: Progress toward achieving an optimal weight will improve Outcome: Not Progressing   Problem: Skin Integrity: Goal: Risk for impaired skin integrity will decrease Outcome: Not Progressing   Problem: Tissue Perfusion: Goal: Adequacy of tissue perfusion will improve Outcome: Not Progressing

## 2023-02-09 NOTE — Progress Notes (Signed)
Patient noted to continue with being fatigue post transfusion and minimally responsive mostly to pain.  Dr. Denton Lank updated and palliative NP spoke with family regarding plan of care.  Family at bedside and will update this RN if patient becomes uncomfortable.

## 2023-02-09 NOTE — Hospital Course (Addendum)
88 yo female who presented to the ED on 02/07/2023 with family after being found unresponsive outside of her home early that morning. Family reports the patient was last seen on cameras around 11PM the previous night. She was found at the bottom on her front steps. At the time of EMS arrival, patient was unresponsive with an initial axillary temperature of 84 degrees and only responding to painful stimuli upon presentation. Family denies behavior changes prior to the event.     In the ED, pt was found to have right intertrochanteric fracture of proximal femur and hypotension and lactic acidosis (LA 7.4>>6.7>>5), mild AKI, septic vs hypovolemic shock, metabolic acidosis, possible aspiration pneumonia.  She was given 2 L IVF's, Bair hugger for hypothermia, IV antibiotics, started on Levophed and IV antibiotics.  Admitted to ICU.   Orthopedic surgery was consulted and pt underwent ORIF with nail on evening of 10/17 with Dr. Joice Lofts.  10/18 - low BP's but off pressors. Family reported hx of baseline lower BP's as well.  Palliative care consulted.  10/19 - [facility] assumed care. Called to bedside early AM due to clinical deterioration with ongoing hypotension MAP's adequate.  Pt minimally responsive but would awaken, follow commands, no focal neurologic deficit.  Discussed guarded to poor prognosis with family at bedside, and potential options for aggressive management versus comfort approach.    Palliative met with family again as well.  Seen by Cardiology and family elected not to pursue advanced HF management, leaning towards comfort care.  Repeat Hbg 6.7, transfused 1 unit pRBC's.  Resumed on antibiotics for aspiration PNA.  Plan is 'watchful waiting' over coming hours to days.  10/20 - pt doing better, Hbg improved w transfusion, 8.4 this AM.  BP's improved, remain soft but stable MAP's consistently.  Pt awake, alert, interactive, denies complaints.   Further hospital course and management as outlined  below.

## 2023-02-09 NOTE — Progress Notes (Signed)
[facility] CLINIC CARDIOLOGY PROGRESS NOTE   Patient ID: LOUVENA [name] MRN: [id number] DOB/AGE: [birth year] 86 y.o.  Admit date: 02/07/2023 Referring Physician Dr. Erin Fulling  Primary Physician Para March, Dwana Curd, MD Primary Cardiologist Dr. Dorothyann Peng Reason for Consultation SVT   HPI: Vanessa [name] is a 88 y.o. female  with a past medical history of SVT, PAF, HTN, CHF, near syncope, COPD, dementia, diabetes mellitus, hypothyroidism who presented to the ED on 02/07/2023 after an unwitnessed fall and found by EMS unresponsive outside her home. She was found at bottom of her front steps. Patient found to fracture her right proximal femur. Cardiology was consulted for further evaluation.    Patient was found by EMS unresponsive at 0715 on 10/17 with an axillary temperature of 84 degrees and only responded to painful stimuli. Patient did not lose consciousness. Workup in the ED notable for atrial fibrillation at rate of 84. CT head with no evidence of intracranial hemorrhage. CXR shows no acute findings.  K 3.0 > 3.5, Na 136, Phos 5.0, Cr 1.07, CK 336, Lactic acid 7.4 >> 6.7 >> 5.0 >> 5.9 >> 4.2. Procal 1.26. WBC 20.2 >> 15.8 >> 18.5. Hgb trending down 10.0 >> 9.2 >> 9.0.  Patient received IVF, started on levophed for hypotension and d/c at 1120 on 10/17, initiated bear hugger for hypothermia. Hypothermia improved with bear hugger. Received 1 dose of ceftriaxone and doxycyline. Started cefazolin.   Orthopedic consulted for OR repair of patient's right proximal femur fracture. Reduction and internal fixation of displaced intertrochanteric right hip fracture was done by Dr. Joice Lofts on 10/17.   Interval History:  -Patient seen and examined at bedside. Her clinical status has changed drastically compared to yesterday. Patient is hypotensive, alternating between tachy/brady, and she is relatively unarousable. Her hemoglobin this morning was <7 so she will be receiving blood transfusion. I discussed  with family at length their goals of care. They do not want any invasive procedures for the patient. They will discuss amongst themselves and decide on comfort care.  Review of systems complete and found to be negative unless listed above    Vitals:   02/09/23 1200 02/09/23 1300 02/09/23 1313 02/09/23 1315  BP: (!) 96/32 (!) 98/48 102/68 102/68  Pulse: 63 65 68 72  Resp: (!) 23 (!) 21 (!) 24 18  Temp:   98.8 F (37.1 C) 98.6 F (37 C)  TempSrc:   Bladder Bladder  SpO2: 100% 93% 94% 99%  Weight:      Height:         Intake/Output Summary (Last 24 hours) at 02/09/2023 1339 Last data filed at 02/09/2023 1313 Gross per 24 hour  Intake 551.47 ml  Output 725 ml  Net -173.53 ml     PHYSICAL EXAM General: obtunded, well nourished, in no acute distress. HEENT: Normocephalic and atraumatic. Neck: No JVD.  Lungs: Normal respiratory effort. Clear bilaterally to auscultation. +wheezes. No crackles, rhonchi.  Heart: HRRR. Normal S1 and S2 without gallops or murmurs. Radial & DP pulses 2+ bilaterally. Abdomen: Non-distended appearing.  Msk: Normal strength and tone for age. Extremities: No clubbing, cyanosis or edema.   Neuro: obtunded    LABS: Basic Metabolic Panel: Recent Labs    02/08/23 0455 02/09/23 0631  NA 140 137  K 4.2 4.6  CL 110 108  CO2 22 22  GLUCOSE 96 149*  BUN 21 27*  CREATININE 1.08* 1.19*  CALCIUM 8.2* 8.3*  MG 1.7 2.3  PHOS 4.1 4.4   Liver  [facility] CLINIC CARDIOLOGY PROGRESS NOTE   Patient ID: LOUVENA [name] MRN: [id number] DOB/AGE: [birth year] 86 y.o.  Admit date: 02/07/2023 Referring Physician Dr. Erin Fulling  Primary Physician Para March, Dwana Curd, MD Primary Cardiologist Dr. Dorothyann Peng Reason for Consultation SVT   HPI: Vanessa [name] is a 88 y.o. female  with a past medical history of SVT, PAF, HTN, CHF, near syncope, COPD, dementia, diabetes mellitus, hypothyroidism who presented to the ED on 02/07/2023 after an unwitnessed fall and found by EMS unresponsive outside her home. She was found at bottom of her front steps. Patient found to fracture her right proximal femur. Cardiology was consulted for further evaluation.    Patient was found by EMS unresponsive at 0715 on 10/17 with an axillary temperature of 84 degrees and only responded to painful stimuli. Patient did not lose consciousness. Workup in the ED notable for atrial fibrillation at rate of 84. CT head with no evidence of intracranial hemorrhage. CXR shows no acute findings.  K 3.0 > 3.5, Na 136, Phos 5.0, Cr 1.07, CK 336, Lactic acid 7.4 >> 6.7 >> 5.0 >> 5.9 >> 4.2. Procal 1.26. WBC 20.2 >> 15.8 >> 18.5. Hgb trending down 10.0 >> 9.2 >> 9.0.  Patient received IVF, started on levophed for hypotension and d/c at 1120 on 10/17, initiated bear hugger for hypothermia. Hypothermia improved with bear hugger. Received 1 dose of ceftriaxone and doxycyline. Started cefazolin.   Orthopedic consulted for OR repair of patient's right proximal femur fracture. Reduction and internal fixation of displaced intertrochanteric right hip fracture was done by Dr. Joice Lofts on 10/17.   Interval History:  -Patient seen and examined at bedside. Her clinical status has changed drastically compared to yesterday. Patient is hypotensive, alternating between tachy/brady, and she is relatively unarousable. Her hemoglobin this morning was <7 so she will be receiving blood transfusion. I discussed  with family at length their goals of care. They do not want any invasive procedures for the patient. They will discuss amongst themselves and decide on comfort care.  Review of systems complete and found to be negative unless listed above    Vitals:   02/09/23 1200 02/09/23 1300 02/09/23 1313 02/09/23 1315  BP: (!) 96/32 (!) 98/48 102/68 102/68  Pulse: 63 65 68 72  Resp: (!) 23 (!) 21 (!) 24 18  Temp:   98.8 F (37.1 C) 98.6 F (37 C)  TempSrc:   Bladder Bladder  SpO2: 100% 93% 94% 99%  Weight:      Height:         Intake/Output Summary (Last 24 hours) at 02/09/2023 1339 Last data filed at 02/09/2023 1313 Gross per 24 hour  Intake 551.47 ml  Output 725 ml  Net -173.53 ml     PHYSICAL EXAM General: obtunded, well nourished, in no acute distress. HEENT: Normocephalic and atraumatic. Neck: No JVD.  Lungs: Normal respiratory effort. Clear bilaterally to auscultation. +wheezes. No crackles, rhonchi.  Heart: HRRR. Normal S1 and S2 without gallops or murmurs. Radial & DP pulses 2+ bilaterally. Abdomen: Non-distended appearing.  Msk: Normal strength and tone for age. Extremities: No clubbing, cyanosis or edema.   Neuro: obtunded    LABS: Basic Metabolic Panel: Recent Labs    02/08/23 0455 02/09/23 0631  NA 140 137  K 4.2 4.6  CL 110 108  CO2 22 22  GLUCOSE 96 149*  BUN 21 27*  CREATININE 1.08* 1.19*  CALCIUM 8.2* 8.3*  MG 1.7 2.3  PHOS 4.1 4.4   Liver  Function Tests: Recent Labs    02/07/23 0814  AST 37  ALT 17  ALKPHOS 37*  BILITOT 0.7  PROT 5.0*  ALBUMIN 2.8*   Recent Labs    02/07/23 0814  LIPASE 28   CBC: Recent Labs    02/07/23 0814 02/07/23 1424 02/08/23 0455 02/09/23 0118 02/09/23 0834  WBC 20.2*   < > 16.0*  --  12.2*  NEUTROABS 17.1*  --   --   --   --   HGB 10.0*   < > 7.8* 7.3* 6.7*  HCT 31.1*   < > 24.1* 22.6* 19.9*  MCV 93.4   < > 93.1  --  90.9  PLT 208   < > 169  --  152   < > = values in this interval not displayed.    Cardiac Enzymes: Recent Labs    02/07/23 0814  CKTOTAL 336*   BNP: Recent Labs    02/09/23 0118  BNP 954.4*   D-Dimer: No results for input(s): "DDIMER" in the last 72 hours. Hemoglobin A1C: Recent Labs    02/07/23 0814  HGBA1C 7.7*   Fasting Lipid Panel: No results for input(s): "CHOL", "HDL", "LDLCALC", "TRIG", "CHOLHDL", "LDLDIRECT" in the last 72 hours. Thyroid Function Tests: Recent Labs    02/07/23 0815  TSH 3.984   Anemia Panel: No results for input(s): "VITAMINB12", "FOLATE", "FERRITIN", "TIBC", "IRON", "RETICCTPCT" in the last 72 hours.  ECHOCARDIOGRAM COMPLETE  Result Date: 02/09/2023    ECHOCARDIOGRAM REPORT   Patient Name:   Vanessa [name] Date of Exam: 02/08/2023 Medical Rec #:  [id number]        Height:       64.0 in Accession #:    [id number]       Weight:       131.6 lb Date of Birth:  17-Nov-1934        BSA:          1.638 m Patient Age:    88 years         BP:           84/58 mmHg Patient Gender: F                HR:           139 bpm. Exam Location:  [facility] Procedure: 2D Echo, Cardiac Doppler and Color Doppler Indications:     Abnormal ECG R94.31  History:         Patient has prior history of Echocardiogram examinations, most                  recent 10/11/2017. Pulmonary HTN; Risk Factors:Dyslipidemia.                  Paroxysmal Afib.  Sonographer:     Cristela Blue Referring Phys:  [id number] [facility] Jhoan Schmieder Diagnosing Phys: Rozell Searing Afia Messenger  Sonographer Comments: Technically challenging study due to limited acoustic windows and no apical window. IMPRESSIONS  1. Left ventricular ejection fraction, by estimation, is 45 to 50%. Left ventricular ejection fraction by PLAX is 49 %. The left ventricle has mildly decreased function. The left ventricle demonstrates global hypokinesis. Left ventricular diastolic parameters are indeterminate.  2. Right ventricular systolic function is normal. The right ventricular size is normal. There is normal pulmonary artery systolic  pressure. The estimated right ventricular systolic pressure is 34.8 mmHg.  3. The mitral valve is grossly normal. Trivial mitral valve regurgitation.  4. The aortic valve is grossly normal. Aortic  Function Tests: Recent Labs    02/07/23 0814  AST 37  ALT 17  ALKPHOS 37*  BILITOT 0.7  PROT 5.0*  ALBUMIN 2.8*   Recent Labs    02/07/23 0814  LIPASE 28   CBC: Recent Labs    02/07/23 0814 02/07/23 1424 02/08/23 0455 02/09/23 0118 02/09/23 0834  WBC 20.2*   < > 16.0*  --  12.2*  NEUTROABS 17.1*  --   --   --   --   HGB 10.0*   < > 7.8* 7.3* 6.7*  HCT 31.1*   < > 24.1* 22.6* 19.9*  MCV 93.4   < > 93.1  --  90.9  PLT 208   < > 169  --  152   < > = values in this interval not displayed.    Cardiac Enzymes: Recent Labs    02/07/23 0814  CKTOTAL 336*   BNP: Recent Labs    02/09/23 0118  BNP 954.4*   D-Dimer: No results for input(s): "DDIMER" in the last 72 hours. Hemoglobin A1C: Recent Labs    02/07/23 0814  HGBA1C 7.7*   Fasting Lipid Panel: No results for input(s): "CHOL", "HDL", "LDLCALC", "TRIG", "CHOLHDL", "LDLDIRECT" in the last 72 hours. Thyroid Function Tests: Recent Labs    02/07/23 0815  TSH 3.984   Anemia Panel: No results for input(s): "VITAMINB12", "FOLATE", "FERRITIN", "TIBC", "IRON", "RETICCTPCT" in the last 72 hours.  ECHOCARDIOGRAM COMPLETE  Result Date: 02/09/2023    ECHOCARDIOGRAM REPORT   Patient Name:   Vanessa [name] Date of Exam: 02/08/2023 Medical Rec #:  [id number]        Height:       64.0 in Accession #:    [id number]       Weight:       131.6 lb Date of Birth:  17-Nov-1934        BSA:          1.638 m Patient Age:    88 years         BP:           84/58 mmHg Patient Gender: F                HR:           139 bpm. Exam Location:  [facility] Procedure: 2D Echo, Cardiac Doppler and Color Doppler Indications:     Abnormal ECG R94.31  History:         Patient has prior history of Echocardiogram examinations, most                  recent 10/11/2017. Pulmonary HTN; Risk Factors:Dyslipidemia.                  Paroxysmal Afib.  Sonographer:     Cristela Blue Referring Phys:  [id number] [facility] Jhoan Schmieder Diagnosing Phys: Rozell Searing Afia Messenger  Sonographer Comments: Technically challenging study due to limited acoustic windows and no apical window. IMPRESSIONS  1. Left ventricular ejection fraction, by estimation, is 45 to 50%. Left ventricular ejection fraction by PLAX is 49 %. The left ventricle has mildly decreased function. The left ventricle demonstrates global hypokinesis. Left ventricular diastolic parameters are indeterminate.  2. Right ventricular systolic function is normal. The right ventricular size is normal. There is normal pulmonary artery systolic  pressure. The estimated right ventricular systolic pressure is 34.8 mmHg.  3. The mitral valve is grossly normal. Trivial mitral valve regurgitation.  4. The aortic valve is grossly normal. Aortic

## 2023-02-09 NOTE — Progress Notes (Incomplete)
1910 - Received report from [place], Charity fundraiser. Pt resting comfortably in bed with family at bedside on 4L [place]. Bed in lowest position with bed alarms on and call bell within reach. Family states they will notify nursing staff if the patient needs anything.   1929 - Secure chat sent to Dr. Joylene Igo to notify that pt's family is declining blood sugar checks and Insulin at this time.   1935 - Continue to offer and document CBG and Insulin per Dr. Arville Care.   0532 - Pt awake and requesting a drink and Tylenol. Pt able to drink water without stopping and coughing. Administered PRN Tylenol. Expiratory wheezing noted. Secure chat sent to Dr. Arville Care. Duonebs ordered. Pt's daughters remain at bedside. Bed in lowest position with bed alarms on and call bell within reach.

## 2023-02-10 DIAGNOSIS — R6521 Severe sepsis with septic shock: Secondary | ICD-10-CM | POA: Diagnosis not present

## 2023-02-10 DIAGNOSIS — T68XXXA Hypothermia, initial encounter: Secondary | ICD-10-CM | POA: Diagnosis not present

## 2023-02-10 DIAGNOSIS — A419 Sepsis, unspecified organism: Secondary | ICD-10-CM | POA: Diagnosis not present

## 2023-02-10 DIAGNOSIS — S72141A Displaced intertrochanteric fracture of right femur, initial encounter for closed fracture: Secondary | ICD-10-CM | POA: Diagnosis not present

## 2023-02-10 DIAGNOSIS — Z515 Encounter for palliative care: Secondary | ICD-10-CM | POA: Diagnosis not present

## 2023-02-10 LAB — COMPREHENSIVE METABOLIC PANEL
ALT: 7 U/L (ref 0–44)
AST: 47 U/L — ABNORMAL HIGH (ref 15–41)
Albumin: 2.6 g/dL — ABNORMAL LOW (ref 3.5–5.0)
Alkaline Phosphatase: 37 U/L — ABNORMAL LOW (ref 38–126)
Anion gap: 9 (ref 5–15)
BUN: 24 mg/dL — ABNORMAL HIGH (ref 8–23)
CO2: 24 mmol/L (ref 22–32)
Calcium: 8.1 mg/dL — ABNORMAL LOW (ref 8.9–10.3)
Chloride: 106 mmol/L (ref 98–111)
Creatinine, Ser: 0.83 mg/dL (ref 0.44–1.00)
GFR, Estimated: >60 mL/min (ref >60)
Glucose, Bld: 108 mg/dL — ABNORMAL HIGH (ref 70–99)
Potassium: 3.9 mmol/L (ref 3.5–5.1)
Sodium: 139 mmol/L (ref 135–145)
Total Bilirubin: 0.9 mg/dL (ref 0.3–1.2)
Total Protein: 4.9 g/dL — ABNORMAL LOW (ref 6.5–8.1)

## 2023-02-10 LAB — TYPE AND SCREEN
ABO/RH(D): A NEG
Antibody Screen: NEGATIVE
Unit division: 0

## 2023-02-10 LAB — GLUCOSE, CAPILLARY
Glucose-Capillary: 215 mg/dL — ABNORMAL HIGH (ref 70–99)
Glucose-Capillary: 115 mg/dL — ABNORMAL HIGH (ref 70–99)

## 2023-02-10 LAB — BPAM RBC
Blood Product Expiration Date: 202411032359
ISSUE DATE / TIME: 202410191308
Unit Type and Rh: 600

## 2023-02-10 LAB — CORTISOL-AM, BLOOD: Cortisol - AM: 15.1 ug/dL (ref 6.7–22.6)

## 2023-02-10 LAB — CBC
HCT: 26.2 % — ABNORMAL LOW (ref 36.0–46.0)
Hemoglobin: 8.4 g/dL — ABNORMAL LOW (ref 12.0–15.0)
MCH: 29.4 pg (ref 26.0–34.0)
MCHC: 32.1 g/dL (ref 30.0–36.0)
MCV: 91.6 fL (ref 80.0–100.0)
Platelets: 142 10*3/uL — ABNORMAL LOW (ref 150–400)
RBC: 2.86 MIL/uL — ABNORMAL LOW (ref 3.87–5.11)
RDW: 15.5 % (ref 11.5–15.5)
WBC: 10.5 10*3/uL (ref 4.0–10.5)
nRBC: 0.7 % — ABNORMAL HIGH (ref 0.0–0.2)

## 2023-02-10 LAB — PROCALCITONIN: Procalcitonin: 3.5 ng/mL

## 2023-02-10 MED ORDER — METOPROLOL TARTRATE 5 MG/5ML IV SOLN
2.5000 mg | Freq: Once | INTRAVENOUS | Status: AC
  Administered 2023-02-10: 2.5 mg via INTRAVENOUS
  Filled 2023-02-10: qty 5

## 2023-02-10 MED ORDER — FUROSEMIDE 20 MG PO TABS
20.0000 mg | ORAL_TABLET | Freq: Every day | ORAL | Status: DC
  Administered 2023-02-10 – 2023-02-15 (×6): 20 mg via ORAL
  Filled 2023-02-10 (×6): qty 1

## 2023-02-10 MED ORDER — LEVOTHYROXINE SODIUM 25 MCG PO TABS
25.0000 ug | ORAL_TABLET | Freq: Every day | ORAL | Status: DC
  Administered 2023-02-10 – 2023-02-15 (×6): 25 ug via ORAL
  Filled 2023-02-10 (×6): qty 1

## 2023-02-10 MED ORDER — IPRATROPIUM-ALBUTEROL 0.5-2.5 (3) MG/3ML IN SOLN
3.0000 mL | Freq: Four times a day (QID) | RESPIRATORY_TRACT | Status: DC
  Administered 2023-02-10 (×2): 3 mL via RESPIRATORY_TRACT
  Filled 2023-02-10 (×2): qty 3

## 2023-02-10 MED ORDER — IPRATROPIUM-ALBUTEROL 0.5-2.5 (3) MG/3ML IN SOLN: 3.0000 mL | RESPIRATORY_TRACT | Status: DC | PRN

## 2023-02-10 MED ORDER — TRAMADOL HCL 50 MG PO TABS
50.0000 mg | ORAL_TABLET | Freq: Four times a day (QID) | ORAL | Status: DC | PRN
  Administered 2023-02-10 – 2023-02-14 (×9): 50 mg via ORAL
  Filled 2023-02-10 (×10): qty 1

## 2023-02-10 MED ORDER — METOPROLOL SUCCINATE ER 50 MG PO TB24
25.0000 mg | ORAL_TABLET | Freq: Two times a day (BID) | ORAL | Status: DC
  Administered 2023-02-11 – 2023-02-12 (×2): 25 mg via ORAL
  Filled 2023-02-10 (×4): qty 1

## 2023-02-10 NOTE — Progress Notes (Signed)
Physical Therapy Treatment Patient Details Name: Vanessa [name] MRN: [id number] DOB: Sep 12, 1934 Today's Date: 02/10/2023   History of Present Illness Pt is a 88 y.o. female who presented to the ED 02/07/23 with family after being found unresponsive outside of her home around 7:15AM. Family reports the Pt was last seen on cameras around 11PM the night before. When she was found this morning, she was at the bottom on her front steps. At the time of EMS arrival, patient was unresponsive with an initial axillary temperature of 84 degrees and only responding to painful stimuli upon presentation. Family denies behavior changes prior to the event. Pt found to have right intertrochanteric fracture of proximal femur also noted to have low blood pressure. R Intramedullary (IM) Nail Introtrochanteric sx done on 02/07/23.    PT Comments  Contacted by RN stating pt is feeling better and ready for session.  Pt awake with family in room.  She participates in supine AAROM x 10 for BLE's with more help given R than L.  She is fatigued with activity.  OOB/sitting is deferred at this time.  Will continue as appropriate.   If plan is discharge home, recommend the following: A lot of help with walking and/or transfers;A lot of help with bathing/dressing/bathroom;Direct supervision/assist for medications management;Assist for transportation;Help with stairs or ramp for entrance;Assistance with cooking/housework   Can travel by private vehicle        Equipment Recommendations  None recommended by PT    Recommendations for Other Services       Precautions / Restrictions Precautions Precautions: Fall Precaution Comments: vitals Restrictions Weight Bearing Restrictions: Yes RLE Weight Bearing: Weight bearing as tolerated     Mobility  Bed Mobility                    Transfers                        Ambulation/Gait                   Stairs             Wheelchair  Mobility     Tilt Bed    Modified Rankin (Stroke Patients Only)       Balance                                            Cognition Arousal: Alert, Lethargic Behavior During Therapy: WFL for tasks assessed/performed Overall Cognitive Status: Within Functional Limits for tasks assessed                                          Exercises Other Exercises Other Exercises: supine AAROM R and LLE x 10    General Comments        Pertinent Vitals/Pain Pain Assessment Pain Assessment: Faces Faces Pain Scale: Hurts little more Pain Location: R hip with movement Pain Descriptors / Indicators: Sore, Operative site guarding, Guarding, Grimacing Pain Intervention(s): Limited activity within patient's tolerance, Monitored during session    Home Living                          Prior Function  PT Goals (current goals can now be found in the care plan section) Progress towards PT goals: Not progressing toward goals - comment    Frequency    BID      PT Plan      Co-evaluation              AM-PAC PT "6 Clicks" Mobility   Outcome Measure  Help needed turning from your back to your side while in a flat bed without using bedrails?: Total Help needed moving from lying on your back to sitting on the side of a flat bed without using bedrails?: Total Help needed moving to and from a bed to a chair (including a wheelchair)?: Total Help needed standing up from a chair using your arms (e.g., wheelchair or bedside chair)?: Total Help needed to walk in hospital room?: Total Help needed climbing 3-5 steps with a railing? : Total 6 Click Score: 6    End of Session Equipment Utilized During Treatment: Oxygen Activity Tolerance: Patient limited by pain;Patient limited by fatigue Patient left: in bed;with call bell/phone within reach;with bed alarm set;with family/visitor present Nurse Communication: Mobility status PT  Visit Diagnosis: Unsteadiness on feet (R26.81);Muscle weakness (generalized) (M62.81);Pain Pain - Right/Left: Right Pain - part of body: Hip     Time: 1660-6301 PT Time Calculation (min) (ACUTE ONLY): 10 min  Charges:    $Therapeutic Exercise: 8-22 mins PT General Charges $$ ACUTE PT VISIT: 1 Visit                    Danielle Dess, PTA 02/10/23, 2:39 PM

## 2023-02-10 NOTE — Progress Notes (Signed)
PT Cancellation Note  Patient Details Name: Vanessa [name] MRN: [id number] DOB: Aug 19, 1934   Cancelled Treatment:    Reason Eval/Treat Not Completed: Medical issues which prohibited therapy  Discussed with RN this am.  Will hold therapy at this time.  RN will let me know if she is appropriate today but anticipate medical hold.   Danielle Dess 02/10/2023, 9:10 AM

## 2023-02-10 NOTE — Progress Notes (Signed)
Daily Progress Note   Patient Name: Vanessa [name]       Date: 02/10/2023 DOB: April 01, 1935  Age: 88 y.o. MRN#: [id number] Attending Physician: Pennie Banter, DO Primary Care Physician: Joaquim Nam, MD Admit Date: 02/07/2023  Reason for Consultation/Follow-up: Establishing goals of care  HPI/Brief Hospital Review: 88 y.o. female  with past medical history of chronic combined systolic and diastolic HF, A-fib, COPD, pulmonary emphysema, type 2 diabetes, hypothyroidism, anemia, and depression admitted on 02/07/2023 with hypothermia after being found down outside of her home.    Patient found to have right intertrochanteric fracture of proximal femur.  10/17, patient underwent right hip IM nail placement.   Patient is hypotensive and tachycardic.   PMT was consulted to discuss goals of care  Subjective: Extensive chart review has been completed prior to meeting patient including labs, vital signs, imaging, progress notes, orders, and available advanced directive documents from current and previous encounters.    Visited with Ms. Guisinger at her bedside.  Has had significant improvement overnight.  She is awake, alert and able to engage in conversations.  She declines acute pain or discomfort.  Family at bedside happy of ongoing improvement and remains hopeful for continued and ongoing recovery.  PMT will continue to closely follow allowing for time for outcomes.  Care plan was discussed with nursing staff.  Thank you for allowing the Palliative Medicine Team to assist in the care of this patient.  Total time:  25 minutes  Time spent includes: Detailed review of medical records (labs, imaging, vital signs), medically appropriate exam (mental status, respiratory, cardiac, skin),  discussed with treatment team, counseling and educating patient, family and staff, documenting clinical information, medication management and coordination of care.  Leeanne Deed, DNP, AGNP-C Palliative Medicine   Please contact Palliative Medicine Team phone at [phone] for questions and concerns.

## 2023-02-10 NOTE — Plan of Care (Signed)

## 2023-02-10 NOTE — Progress Notes (Signed)
[facility] CLINIC CARDIOLOGY PROGRESS NOTE   Patient ID: Vanessa [name] MRN: [id number] DOB/AGE: April 23, 1935 88 y.o.  Admit date: 02/07/2023 Referring Physician Dr. Erin Fulling  Primary Physician Para March, Dwana Curd, MD Primary Cardiologist Dr. Dorothyann Peng Reason for Consultation SVT   HPI: Vanessa [name] is a 88 y.o. female  with a past medical history of SVT, PAF, HTN, CHF, near syncope, COPD, dementia, diabetes mellitus, hypothyroidism who presented to the ED on 02/07/2023 after an unwitnessed fall and found by EMS unresponsive outside her home. She was found at bottom of her front steps. Patient found to fracture her right proximal femur. Cardiology was consulted for further evaluation.    Patient was found by EMS unresponsive at 0715 on 10/17 with an axillary temperature of 84 degrees and only responded to painful stimuli. Patient did not lose consciousness. Workup in the ED notable for atrial fibrillation at rate of 84. CT head with no evidence of intracranial hemorrhage. CXR shows no acute findings.  K 3.0 > 3.5, Na 136, Phos 5.0, Cr 1.07, CK 336, Lactic acid 7.4 >> 6.7 >> 5.0 >> 5.9 >> 4.2. Procal 1.26. WBC 20.2 >> 15.8 >> 18.5. Hgb trending down 10.0 >> 9.2 >> 9.0.  Patient received IVF, started on levophed for hypotension and d/c at 1120 on 10/17, initiated bear hugger for hypothermia. Hypothermia improved with bear hugger. Received 1 dose of ceftriaxone and doxycyline. Started cefazolin.   Orthopedic consulted for OR repair of patient's right proximal femur fracture. Reduction and internal fixation of displaced intertrochanteric right hip fracture was done by Dr. Joice Lofts on 10/17.   Interval History:  -Patient seen and examined at bedside. Today, she is awake and alert. Family at bedside, they state she is more like herself today. She does complain of pain in her leg but otherwise she is feeling pretty good. Denies chest pain, shortness of breath, palpitations, diaphoresis, syncope,  edema, PND, orthopnea.   Review of systems complete and found to be negative unless listed above    Vitals:   02/10/23 1030 02/10/23 1100 02/10/23 1130 02/10/23 1205  BP:  (!) 104/49 (!) 94/58   Pulse: 71 71 66   Resp: 13 (!) 9 13   Temp: 98.4 F (36.9 C) 98.6 F (37 C) 98.8 F (37.1 C)   TempSrc:      SpO2: 100% 100% 100% 99%  Weight:      Height:         Intake/Output Summary (Last 24 hours) at 02/10/2023 1237 Last data filed at 02/10/2023 1038 Gross per 24 hour  Intake 1232.07 ml  Output 1050 ml  Net 182.07 ml     PHYSICAL EXAM General: obtunded, well nourished, in no acute distress. HEENT: Normocephalic and atraumatic. Neck: No JVD.  Lungs: Normal respiratory effort. Clear bilaterally to auscultation. No crackles, rhonchi.  Heart: HRRR. Normal S1 and S2 without gallops or murmurs. Radial & DP pulses 2+ bilaterally. Abdomen: Non-distended appearing.  Msk: Normal strength and tone for age. Extremities: No clubbing, cyanosis or edema.   Neuro: awake and alert    LABS: Basic Metabolic Panel: Recent Labs    02/08/23 0455 02/09/23 0631 02/10/23 0656  NA 140 137 139  K 4.2 4.6 3.9  CL 110 108 106  CO2 22 22 24   GLUCOSE 96 149* 108*  BUN 21 27* 24*  CREATININE 1.08* 1.19* 0.83  CALCIUM 8.2* 8.3* 8.1*  MG 1.7 2.3  --   PHOS 4.1 4.4  --    Liver  Function Tests: Recent Labs    02/10/23 0656  AST 47*  ALT 7  ALKPHOS 37*  BILITOT 0.9  PROT 4.9*  ALBUMIN 2.6*   No results for input(s): "LIPASE", "AMYLASE" in the last 72 hours.  CBC: Recent Labs    02/09/23 0834 02/09/23 1655 02/10/23 0656  WBC 12.2*  --  10.5  HGB 6.7* 9.2* 8.4*  HCT 19.9* 28.7* 26.2*  MCV 90.9  --  91.6  PLT 152  --  142*   Cardiac Enzymes: No results for input(s): "CKTOTAL", "CKMB", "CKMBINDEX", "TROPONINIHS" in the last 72 hours.  BNP: Recent Labs    02/09/23 0118  BNP 954.4*   D-Dimer: No results for input(s): "DDIMER" in the last 72 hours. Hemoglobin  A1C: No results for input(s): "HGBA1C" in the last 72 hours.  Fasting Lipid Panel: No results for input(s): "CHOL", "HDL", "LDLCALC", "TRIG", "CHOLHDL", "LDLDIRECT" in the last 72 hours. Thyroid Function Tests: No results for input(s): "TSH", "T4TOTAL", "T3FREE", "THYROIDAB" in the last 72 hours.  Invalid input(s): "FREET3"  Anemia Panel: No results for input(s): "VITAMINB12", "FOLATE", "FERRITIN", "TIBC", "IRON", "RETICCTPCT" in the last 72 hours.  ECHOCARDIOGRAM COMPLETE  Result Date: 02/09/2023    ECHOCARDIOGRAM REPORT   Patient Name:   Vanessa [name] Date of Exam: 02/08/2023 Medical Rec #:  [id number]        Height:       64.0 in Accession #:    [id number]       Weight:       131.6 lb Date of Birth:  1934/09/27        BSA:          1.638 m Patient Age:    88 years         BP:           84/58 mmHg Patient Gender: F                HR:           139 bpm. Exam Location:  [facility] Procedure: 2D Echo, Cardiac Doppler and Color Doppler Indications:     Abnormal ECG R94.31  History:         Patient has prior history of Echocardiogram examinations, most                  recent 10/11/2017. Pulmonary HTN; Risk Factors:Dyslipidemia.                  Paroxysmal Afib.  Sonographer:     Cristela Blue Referring Phys:  [id number] [facility] Alyene Predmore Diagnosing Phys: Rozell Searing Julis Haubner  Sonographer Comments: Technically challenging study due to limited acoustic windows and no apical window. IMPRESSIONS  1. Left ventricular ejection fraction, by estimation, is 45 to 50%. Left ventricular ejection fraction by PLAX is 49 %. The left ventricle has mildly decreased function. The left ventricle demonstrates global hypokinesis. Left ventricular diastolic parameters are indeterminate.  2. Right ventricular systolic function is normal. The right ventricular size is normal. There is normal pulmonary artery systolic pressure. The estimated right ventricular systolic pressure is 34.8 mmHg.  3. The mitral valve is grossly normal. Trivial  mitral valve regurgitation.  4. The aortic valve is grossly normal. Aortic valve regurgitation is not visualized. Aortic valve sclerosis/calcification is present, without any evidence of aortic stenosis. FINDINGS  Left Ventricle: Left ventricular ejection fraction, by estimation, is 45 to 50%. Left ventricular ejection fraction by PLAX is 49 %. The left ventricle has mildly decreased function. The left  ventricle demonstrates global hypokinesis. The left ventricular internal cavity size was normal in size. There is no left ventricular hypertrophy. Left ventricular diastolic parameters are indeterminate. Right Ventricle: The right ventricular size is normal. No increase in right ventricular wall thickness. Right ventricular systolic function is normal. There is normal pulmonary artery systolic pressure. The tricuspid regurgitant velocity is 2.82 m/s, and  with an assumed right atrial pressure of 3 mmHg, the estimated right ventricular systolic pressure is 34.8 mmHg. Left Atrium: Left atrial size was normal in size. Right Atrium: Right atrial size was normal in size. Pericardium: There is no evidence of pericardial effusion. Mitral Valve: The mitral valve is grossly normal. Trivial mitral valve regurgitation. Tricuspid Valve: The tricuspid valve is grossly normal. Tricuspid valve regurgitation is mild. Aortic Valve: The aortic valve is grossly normal. Aortic valve regurgitation is not visualized. Aortic valve sclerosis/calcification is present, without any evidence of aortic stenosis. Pulmonic Valve: The pulmonic valve was grossly normal. Pulmonic valve regurgitation is not visualized. Aorta: The aortic root is normal in size and structure. IAS/Shunts: No atrial level shunt detected by color flow Doppler.  LEFT VENTRICLE PLAX 2D LV EF:         Left ventricular ejection fraction by PLAX is 49 %. LVIDd:         3.30 cm LVIDs:         2.50 cm LV PW:         0.80 cm LV IVS:        1.10 cm LVOT diam:     2.10 cm LVOT Area:      3.46 cm  LEFT ATRIUM         Index LA diam:    3.80 cm 2.32 cm/m   AORTA Ao Root diam: 2.60 cm TRICUSPID VALVE TR Peak grad:   31.8 mmHg TR Vmax:        282.00 cm/s  SHUNTS Systemic Diam: 2.10 cm Rozell Searing Eara Burruel Electronically signed by Clotilde Dieter Signature Date/Time: 02/09/2023/9:57:22 AM    Final    DG Chest 1 View  Result Date: 02/09/2023 CLINICAL DATA:  88 year old female with history of shortness of breath. EXAM: CHEST  1 VIEW COMPARISON:  Chest x-ray 02/07/2023. FINDINGS: Lung volumes are low. Patient is severely rotated to the right which causes distortion of cardiomediastinal structures limiting today's examination. With these limitations in mind, there is an ill-defined opacity in the right base which may reflect atelectasis and/or consolidation, with superimposed small right pleural effusion. Minimal scarring in the periphery of the left lung base. Left lung otherwise appears clear. No left pleural effusion. No pneumothorax. No evidence of pulmonary edema. Heart size is likely mildly enlarged. Mediastinal contours are distorted. IMPRESSION: 1. Atelectasis and/or consolidation in the right lung base with small right pleural effusion. 2. Mild cardiomegaly. Electronically Signed   By: Trudie Reed M.D.   On: 02/09/2023 06:29     ECHO unchanged compared to prior  TELEMETRY reviewed by me 02/10/23: sinus rhythm   DATA reviewed by me 02/10/23:  last 24h vitals tele labs imaging I/O ED provider note, admission H&P.    ASSESSMENT AND PLAN:  Principal Problem:   Septic shock (HCC)   JURLENE GILCHREST is a 88 y.o. female  with a past medical history of SVT, PAF, HTN, CHF, near syncope, COPD, dementia, diabetes mellitus, hypothyroidism who presented to the ED on 02/07/2023 after an unwitnessed fall and found by EMS unresponsive outside her home. She was found at bottom of her front  steps. Patient found to fracture her right proximal femur. Cardiology was consulted for further  evaluation.    #SVT, hx PAF Today her heart rate is much better controlled. Family would like conservative management.  - PRN IV metoprolol for HR > 135 for better rate control    #Hypertension #HFpEF We will continue to manage conservatively. - Family does not want any invasive procedures (I.e. TVP/PPM/PA catheter) and they do not want aggressive heart failure management at this time.  - Home dose Lasix restarted. - BP/HR is improved today. - Maintain MAP > 65.  - Continue to monitor electrolytes and UO   #COPD Patient is resting comfortable on 1L [place] with stable SpO2 at 96% - Manage per primary.   #Sepsis, Leukocytosis, Lactic acidosis   - Patient is afebrile - Hgb: trending down <7 yesterday. pRBCs given. Vitals more stable today. - Manage per primary.   #right proximal femur fracture s/p reduction and internal fixation No swelling or redness around incision site.  - Manage per ortho.   Clotilde Dieter, DO 02/10/2023, 12:37 PM [facility]

## 2023-02-10 NOTE — Progress Notes (Signed)
   Subjective: 3 Days Post-Op Procedure(s) (LRB): INTRAMEDULLARY (IM) NAIL INTERTROCHANTERIC (Right) Patient reports pain as 0 on 0-10 scale.   Patient more alert than yesterday.  Family at bedside  Objective: Vital signs in last 24 hours: Temp:  [95.2 F (35.1 C)-99.1 F (37.3 C)] 98.6 F (37 C) (10/20 0800) Pulse Rate:  [40-124] 68 (10/20 0800) Resp:  [11-26] 14 (10/20 0800) BP: (66-127)/(32-83) 106/49 (10/20 0800) SpO2:  [93 %-100 %] 99 % (10/20 0829)  Intake/Output from previous day: 10/19 0701 - 10/20 0700 In: 912.1 [P.O.:180; I.V.:62; Blood:370; IV Piggyback:300.1] Out: 1250 [Urine:1250] Intake/Output this shift: No intake/output data recorded.  Recent Labs    02/08/23 0455 02/09/23 0118 02/09/23 0834 02/09/23 1655 02/10/23 0656  HGB 7.8* 7.3* 6.7* 9.2* 8.4*   Recent Labs    02/09/23 0834 02/09/23 1655 02/10/23 0656  WBC 12.2*  --  10.5  RBC 2.19*  --  2.86*  HCT 19.9* 28.7* 26.2*  PLT 152  --  142*   Recent Labs    02/09/23 0631 02/10/23 0656  NA 137 139  K 4.6 3.9  CL 108 106  CO2 22 24  BUN 27* 24*  CREATININE 1.19* 0.83  GLUCOSE 149* 108*  CALCIUM 8.3* 8.1*   No results for input(s): "LABPT", "INR" in the last 72 hours.  EXAM General - Patient is Alert, Appropriate, and Oriented x 2 Extremity - Neurologically intact Neurovascular intact Sensation intact distally Intact pulses distally Dorsiflexion/Plantar flexion intact No cellulitis present Compartment soft Dressing - dressing C/D/I and no drainage Motor Function - intact, moving foot and toes well on exam.   Past Medical History:  Diagnosis Date   Anemia    treated ~2009 with iron, resolved   Arthritis    Cataract    surgery on R catarct 2012   Chronic systolic CHF (congestive heart failure) (HCC)    a. TTE 2012: EF of 45-50%, mild diffuse HK, trivial AI, mild MR, mildly dilated RV with mild reduction of RVSF, mild to moderate TR, mild to moderate PR, mildly elevated PASP    COPD (chronic obstructive pulmonary disease) (HCC)    Family history of adverse reaction to anesthesia    Mother - altered mental status (long term)   Glaucoma    History of pelvic fracture    Hyperglycemia    mild inc in fasting sugar   Hyperlipidemia    Hypothyroidism    Insomnia    Migraines    without aura, sx since age 57   Osteopenia    prev with 10 years of fosamax and intolerant of evista   PAF (paroxysmal atrial fibrillation) (HCC)    a. initially noted 2012; b. CHADS2VASc at least 5 (CHF, age x 2, vasacular disease, female)   Pulmonary hypertension (HCC)    Wears dentures    full upper and lower    Assessment/Plan:   3 Days Post-Op Procedure(s) (LRB): INTRAMEDULLARY (IM) NAIL INTERTROCHANTERIC (Right) Principal Problem:   Septic shock (HCC)  Estimated body mass index is 22.59 kg/m as calculated from the following:   Height as of this encounter: 5\' 4"  (1.626 m).   Weight as of this encounter: 59.7 kg. Advance diet Improved mental status today. Patient alert. Denies any right hip pain today. VSS S/p 1 unit PRBC 02/09/23, Hgb 8.4 today May start PT tomorrow if continued improvement. WBAT RLE    T. Cranston Neighbor, PA-C [facility] 02/10/2023, 10:02 AM

## 2023-02-10 NOTE — Consult Note (Addendum)
PHARMACY CONSULT NOTE - ELECTROLYTES  Pharmacy Consult for Electrolyte Monitoring and Replacement   Recent Labs: Potassium (mmol/L)  Date Value  02/10/2023 3.9   Magnesium (mg/dL)  Date Value  16/01/9603 2.3   Calcium (mg/dL)  Date Value  54/12/8117 8.1 (L)   Albumin (g/dL)  Date Value  14/78/2956 2.6 (L)   Phosphorus (mg/dL)  Date Value  21/30/8657 4.4   Sodium (mmol/L)  Date Value  02/10/2023 139  01/08/2018 134   Height: 5\' 4"  (162.6 cm) Weight: 59.7 kg (131 lb 9.8 oz) IBW/kg (Calculated) : 54.7 Estimated Creatinine Clearance: 42 mL/min (by C-G formula based on SCr of 0.83 mg/dL).  Assessment  Vanessa [name] is a 88 y.o. female presenting with unresponsiveness after being found down outside after fall. PMH significant for Afib, hypothyroidism, osteopenia, COPD, dementia, CHF. Pharmacy has been consulted to monitor and replace electrolytes.  Diet: PO MIVF: N/A  Goal of Therapy: Electrolytes within normal limits  Plan:  No replacement needed F/u with AM labs.   Thank you for allowing pharmacy to be a part of this patient's care.  Ronnald Ramp, PharmD 02/10/2023 8:11 AM

## 2023-02-10 NOTE — Plan of Care (Signed)
  Problem: Education: Goal: Knowledge of General Education information will improve Description: Including pain rating scale, medication(s)/side effects and non-pharmacologic comfort measures Outcome: Progressing   Problem: Health Behavior/Discharge Planning: Goal: Ability to manage health-related needs will improve Outcome: Progressing   Problem: Clinical Measurements: Goal: Ability to maintain clinical measurements within normal limits will improve Outcome: Progressing Goal: Will remain free from infection Outcome: Progressing Goal: Diagnostic test results will improve Outcome: Progressing Goal: Respiratory complications will improve Outcome: Progressing Goal: Cardiovascular complication will be avoided Outcome: Progressing   Problem: Activity: Goal: Risk for activity intolerance will decrease Outcome: Progressing   Problem: Nutrition: Goal: Adequate nutrition will be maintained Outcome: Progressing   Problem: Coping: Goal: Level of anxiety will decrease Outcome: Progressing   Problem: Elimination: Goal: Will not experience complications related to bowel motility Outcome: Progressing Goal: Will not experience complications related to urinary retention Outcome: Progressing   Problem: Pain Managment: Goal: General experience of comfort will improve Outcome: Progressing   Problem: Safety: Goal: Ability to remain free from injury will improve Outcome: Progressing   Problem: Skin Integrity: Goal: Risk for impaired skin integrity will decrease Outcome: Progressing   Problem: Education: Goal: Ability to describe self-care measures that may prevent or decrease complications (Diabetes Survival Skills Education) will improve Outcome: Progressing   Problem: Coping: Goal: Ability to adjust to condition or change in health will improve Outcome: Progressing   Problem: Fluid Volume: Goal: Ability to maintain a balanced intake and output will improve Outcome:  Progressing   Problem: Health Behavior/Discharge Planning: Goal: Ability to identify and utilize available resources and services will improve Outcome: Progressing Goal: Ability to manage health-related needs will improve Outcome: Progressing   Problem: Metabolic: Goal: Ability to maintain appropriate glucose levels will improve Outcome: Progressing   Problem: Nutritional: Goal: Maintenance of adequate nutrition will improve Outcome: Progressing Goal: Progress toward achieving an optimal weight will improve Outcome: Progressing   Problem: Skin Integrity: Goal: Risk for impaired skin integrity will decrease Outcome: Progressing   Problem: Tissue Perfusion: Goal: Adequacy of tissue perfusion will improve Outcome: Progressing   

## 2023-02-10 NOTE — Progress Notes (Signed)
Progress Note   Patient: JONATHAN DANGEL ZOX:[id number] DOB: Jun 30, 1934 DOA: 02/07/2023     3 DOS: the patient was seen and examined on 02/10/2023   Brief hospital course: 88 yo female who presented to the ED on 02/07/2023 with family after being found unresponsive outside of her home early that morning. Family reports the patient was last seen on cameras around 11PM the previous night. She was found at the bottom on her front steps. At the time of EMS arrival, patient was unresponsive with an initial axillary temperature of 84 degrees and only responding to painful stimuli upon presentation. Family denies behavior changes prior to the event.     In the ED, pt was found to have right intertrochanteric fracture of proximal femur and hypotension and lactic acidosis (LA 7.4>>6.7>>5), mild AKI, septic vs hypovolemic shock, metabolic acidosis, possible aspiration pneumonia.  She was given 2 L IVF's, Bair hugger for hypothermia, IV antibiotics, started on Levophed and IV antibiotics.  Admitted to ICU.   Orthopedic surgery was consulted and pt underwent ORIF with nail on evening of 10/17 with Dr. Joice Lofts.  10/18 - low BP's but off pressors. Family reported hx of baseline lower BP's as well.  Palliative care consulted.  10/19 - TRH assumed care. Called to bedside early AM due to clinical deterioration with ongoing hypotension MAP's adequate.  Pt minimally responsive but would awaken, follow commands, no focal neurologic deficit.  Discussed guarded to poor prognosis with family at bedside, and potential options for aggressive management versus comfort approach.    Palliative met with family again as well.  Seen by Cardiology and family elected not to pursue advanced HF management, leaning towards comfort care.  Repeat Hbg 6.7, transfused 1 unit pRBC's.  Resumed on antibiotics for aspiration PNA.  Plan is 'watchful waiting' over coming hours to days.  10/20 - pt doing better, Hbg improved w transfusion, 8.4  this AM.  BP's improved, remain soft but stable MAP's consistently.  Pt awake, alert, interactive, denies complaints.   Further hospital course and management as outlined below.   Assessment and Plan:  Septic Shock - resolved. Due to aspiration PNA. Suspect also Hypovolemic component. Presented hypotensive requiring pressor and initial lactic acid 7.4.   Lactate normalized 10/19 to 1.1 Sepsis physiology resolved.  --Monitor fever curve, CBC, hemodynamics --Maintain MAP > 60-65 --Continue midodrine  Aspiration Pneumonia - in setting of fall and prolonged down time, underlying dementia but family deny signs of dysphagia. Repeat CXR 10/19 - R base consolidation and small pleural effusion.  Procal 6.28 (up from 1.26) --Continue Unasyn  Right Intertrochanteric Femur Fracture  --Surgical repair 10/17 by Dr. Joice Lofts  --Control pain as needed --PT consult --DVT ppx on hold due to anemia needing transfusion  --SCD's   Acute kidney injury - resolved Cr 1.19 >> 0.83 today --IV fluids held due to elevated BNP and 4 L positive fluid balance --Monitor urine output, BMP daily --Avoid nephrotoxic agents & renally dose meds --Maintain MAP 60-65+    Acute on Chronic HFpEF BNP 954 this AM, no overt pulmonary edema on CXR, small R pleural effusion Net IO Since Admission: 4,680.1 mL [02/09/23 1805] --Cardiology following --Repeat BNP with AM labs --Echo pending --Home Lasix resumed today (10/20)  History of Paroxysmal A-fib, SVT --Cardiology following - see recs --Telemetry --IV metoprolol push for HR sustain > 135 --Metoprolol for HR control as BP tolerates    Acute metabolic encephalopathy - resolved Due to hypothermia, septic shock Mentation had reportedly returned  to baseline prior to clinical worsening today (10/19) --Continue to manage underlying issues as outlined --Address pain with least sedating meds possible  --Delirium precautions --Address bowel and bladder   Hypokalemia  - resolved Monitor Bmp, Mg  Replace electrolytes PRN    Acute Blood Loss Anemia 10/19 Hbg 6.7 this AM (from 10 >> ... 7.8 >> 7.3), transfused 1 unit RBC's >> 9.2 Hbg repeat 10/20 Hbg 8.4 No signs of bleeding at hip surgical site DVT prophylaxis on hold, SCD's in place Serial Hbg's to monitor Transfuse for Hbg < 7   COPD - stable, not exacerbated --monitor for hypoxia --Duonebs PRN        Subjective:  Pt awake sitting up in bed this AM, several family at bedside.  She denies any complaints including pain.  Clinically improved significantly in past 24 hours.  Mental status at baseline per family.    Physical Exam: Vitals:   02/10/23 1030 02/10/23 1100 02/10/23 1130 02/10/23 1205  BP:  (!) 104/49 (!) 94/58   Pulse: 71 71 66 (!) 46  Resp: 13 (!) 9 13 20   Temp: 98.4 F (36.9 C) 98.6 F (37 C) 98.8 F (37.1 C) 98.8 F (37.1 C)  TempSrc:      SpO2: 100% 100% 100% 99%  Weight:      Height:       General exam: awake sitting up in bed, alert, no acute distress HEENT: moist mucus membranes, hearing grossly normal  Respiratory system: CTAB anteriorly, diminished right base, no wheezes, normal respiratory effort. Cardiovascular system: normal S1/S2, RRR, +systolic murmur, no BLE edema Gastrointestinal system: soft, NT, ND, no HSM felt, +bowel sounds. Central nervous system: A&O x2, normal speech, grossly non-focal exam Extremities: right lateral hip dressings clean dry intact without signs of signficant bleeding, swelling, erythema, SCD's on BLE's Skin: dry, intact, normal temperature, much less pale appearing Psychiatry: normal mood & affect   Data Reviewed:  Notable labs --  WBC improved 12.2 >> 10.5 Hbg 6.7 >> 9.2 post-transfusion >> 8.4 procal improved 6.28 >> 3.50 Cr normalized 0.83 Alk phos 37, AST 47   Family Communication: multiple at bedside on rounds this AM   Disposition: Status is: Inpatient Remains inpatient appropriate because: severity of illness  as above   Planned Discharge Destination: TBD     Time spent: 45 minutes including time spent at bedside and in coordination of care   Author: Pennie Banter, DO 02/10/2023 3:27 PM  For on call review [email or web address].

## 2023-02-11 DIAGNOSIS — A419 Sepsis, unspecified organism: Secondary | ICD-10-CM | POA: Diagnosis not present

## 2023-02-11 DIAGNOSIS — R6521 Severe sepsis with septic shock: Secondary | ICD-10-CM | POA: Diagnosis not present

## 2023-02-11 DIAGNOSIS — S72141A Displaced intertrochanteric fracture of right femur, initial encounter for closed fracture: Secondary | ICD-10-CM | POA: Diagnosis not present

## 2023-02-11 DIAGNOSIS — Z515 Encounter for palliative care: Secondary | ICD-10-CM | POA: Diagnosis not present

## 2023-02-11 LAB — GLUCOSE, CAPILLARY
Glucose-Capillary: 78 mg/dL (ref 70–99)
Glucose-Capillary: 83 mg/dL (ref 70–99)
Glucose-Capillary: 141 mg/dL — ABNORMAL HIGH (ref 70–99)
Glucose-Capillary: 128 mg/dL — ABNORMAL HIGH (ref 70–99)
Glucose-Capillary: 128 mg/dL — ABNORMAL HIGH (ref 70–99)

## 2023-02-11 LAB — BASIC METABOLIC PANEL
Anion gap: 9 (ref 5–15)
BUN: 18 mg/dL (ref 8–23)
CO2: 27 mmol/L (ref 22–32)
Calcium: 8 mg/dL — ABNORMAL LOW (ref 8.9–10.3)
Chloride: 104 mmol/L (ref 98–111)
Creatinine, Ser: 0.6 mg/dL (ref 0.44–1.00)
GFR, Estimated: >60 mL/min (ref >60)
Glucose, Bld: 86 mg/dL (ref 70–99)
Potassium: 3.4 mmol/L — ABNORMAL LOW (ref 3.5–5.1)
Sodium: 140 mmol/L (ref 135–145)

## 2023-02-11 LAB — CBC
HCT: 24.6 % — ABNORMAL LOW (ref 36.0–46.0)
Hemoglobin: 8.3 g/dL — ABNORMAL LOW (ref 12.0–15.0)
MCH: 30.1 pg (ref 26.0–34.0)
MCHC: 33.7 g/dL (ref 30.0–36.0)
MCV: 89.1 fL (ref 80.0–100.0)
Platelets: 141 10*3/uL — ABNORMAL LOW (ref 150–400)
RBC: 2.76 MIL/uL — ABNORMAL LOW (ref 3.87–5.11)
RDW: 15.4 % (ref 11.5–15.5)
WBC: 9.6 10*3/uL (ref 4.0–10.5)
nRBC: 0.5 % — ABNORMAL HIGH (ref 0.0–0.2)

## 2023-02-11 LAB — PROCALCITONIN: Procalcitonin: 1.86 ng/mL

## 2023-02-11 LAB — MAGNESIUM: Magnesium: 1.9 mg/dL (ref 1.7–2.4)

## 2023-02-11 LAB — BRAIN NATRIURETIC PEPTIDE: B Natriuretic Peptide: 318 pg/mL — ABNORMAL HIGH (ref 0.0–100.0)

## 2023-02-11 MED ORDER — INSULIN ASPART 100 UNIT/ML IJ SOLN
0.0000 [IU] | Freq: Three times a day (TID) | INTRAMUSCULAR | Status: DC
  Administered 2023-02-11: 1 [IU] via SUBCUTANEOUS
  Administered 2023-02-12: 5 [IU] via SUBCUTANEOUS
  Administered 2023-02-12 – 2023-02-13 (×2): 2 [IU] via SUBCUTANEOUS
  Administered 2023-02-13: 1 [IU] via SUBCUTANEOUS
  Administered 2023-02-13: 2 [IU] via SUBCUTANEOUS
  Administered 2023-02-14: 1 [IU] via SUBCUTANEOUS
  Administered 2023-02-14 (×2): 2 [IU] via SUBCUTANEOUS
  Administered 2023-02-15: 1 [IU] via SUBCUTANEOUS
  Administered 2023-02-15: 2 [IU] via SUBCUTANEOUS
  Filled 2023-02-11 (×10): qty 1

## 2023-02-11 MED ORDER — POLYETHYLENE GLYCOL 3350 17 G PO PACK
17.0000 g | PACK | Freq: Every day | ORAL | Status: DC
  Administered 2023-02-11 – 2023-02-14 (×3): 17 g via ORAL
  Filled 2023-02-11 (×5): qty 1

## 2023-02-11 MED ORDER — POTASSIUM CHLORIDE CRYS ER 20 MEQ PO TBCR: 40.0000 meq | EXTENDED_RELEASE_TABLET | Freq: Once | ORAL | Status: DC

## 2023-02-11 MED ORDER — SENNOSIDES-DOCUSATE SODIUM 8.6-50 MG PO TABS
1.0000 | ORAL_TABLET | Freq: Two times a day (BID) | ORAL | Status: DC
  Administered 2023-02-11 – 2023-02-14 (×5): 1 via ORAL
  Filled 2023-02-11 (×8): qty 1

## 2023-02-11 MED ORDER — POTASSIUM CHLORIDE 20 MEQ PO PACK
40.0000 meq | PACK | Freq: Once | ORAL | Status: AC
  Administered 2023-02-11: 40 meq via ORAL
  Filled 2023-02-11: qty 2

## 2023-02-11 NOTE — Progress Notes (Signed)
   Subjective: 4 Days Post-Op Procedure(s) (LRB): INTRAMEDULLARY (IM) NAIL INTERTROCHANTERIC (Right) Patient reports pain as mild. Sitting in chair this am.  Patient with no complaints.  Family at bedside  Objective: Vital signs in last 24 hours: Temp:  [82 F (27.8 C)-99.5 F (37.5 C)] 99.1 F (37.3 C) (10/21 0900) Pulse Rate:  [40-131] 131 (10/21 0900) Resp:  [9-24] 18 (10/21 0900) BP: (86-113)/(43-63) 102/60 (10/21 0900) SpO2:  [90 %-100 %] 91 % (10/21 0900) Weight:  [64.2 kg] 64.2 kg (10/21 0555)  Intake/Output from previous day: 10/20 0701 - 10/21 0700 In: 819.8 [P.O.:420; IV Piggyback:399.8] Out: 1335 [Urine:1335] Intake/Output this shift: No intake/output data recorded.  Recent Labs    02/09/23 0118 02/09/23 0834 02/09/23 1655 02/10/23 0656 02/11/23 0352  HGB 7.3* 6.7* 9.2* 8.4* 8.3*   Recent Labs    02/10/23 0656 02/11/23 0352  WBC 10.5 9.6  RBC 2.86* 2.76*  HCT 26.2* 24.6*  PLT 142* 141*   Recent Labs    02/10/23 0656 02/11/23 0352  NA 139 140  K 3.9 3.4*  CL 106 104  CO2 24 27  BUN 24* 18  CREATININE 0.83 0.60  GLUCOSE 108* 86  CALCIUM 8.1* 8.0*   No results for input(s): "LABPT", "INR" in the last 72 hours.  EXAM General - Patient is Alert, Appropriate, and Oriented  Extremity - Neurologically intact Neurovascular intact Sensation intact distally Intact pulses distally Dorsiflexion/Plantar flexion intact No cellulitis present Compartment soft Dressing - dressing C/D/I and scant drainage Motor Function - intact, moving foot and toes well on exam.   Past Medical History:  Diagnosis Date   Anemia    treated ~2009 with iron, resolved   Arthritis    Cataract    surgery on R catarct 2012   Chronic systolic CHF (congestive heart failure) (HCC)    a. TTE 2012: EF of 45-50%, mild diffuse HK, trivial AI, mild MR, mildly dilated RV with mild reduction of RVSF, mild to moderate TR, mild to moderate PR, mildly elevated PASP   COPD  (chronic obstructive pulmonary disease) (HCC)    Family history of adverse reaction to anesthesia    Mother - altered mental status (long term)   Glaucoma    History of pelvic fracture    Hyperglycemia    mild inc in fasting sugar   Hyperlipidemia    Hypothyroidism    Insomnia    Migraines    without aura, sx since age 39   Osteopenia    prev with 10 years of fosamax and intolerant of evista   PAF (paroxysmal atrial fibrillation) (HCC)    a. initially noted 2012; b. CHADS2VASc at least 5 (CHF, age x 2, vasacular disease, female)   Pulmonary hypertension (HCC)    Wears dentures    full upper and lower    Assessment/Plan:   4 Days Post-Op Procedure(s) (LRB): INTRAMEDULLARY (IM) NAIL INTERTROCHANTERIC (Right) Principal Problem:   Septic shock (HCC)  Estimated body mass index is 24.29 kg/m as calculated from the following:   Height as of this encounter: 5\' 4"  (1.626 m).   Weight as of this encounter: 64.2 kg. Advance diet Continue with PT, WBAT RLE Pain well controlled VSS S/p 1 unit PRBC 02/09/23, Hgb 8.3 today, stable     T. Cranston Neighbor, PA-C [facility] 02/11/2023, 10:11 AM

## 2023-02-11 NOTE — Progress Notes (Signed)
Physical Therapy Treatment Patient Details Name: Vanessa [name] MRN: [id number] DOB: 08/10/1934 Today's Date: 02/11/2023   History of Present Illness Pt is a 88 y.o. female who presented to the ED 02/07/23 with family after being found unresponsive outside of her home around 7:15AM. Family reports the Pt was last seen on cameras around 11PM the night before. When she was found this morning, she was at the bottom on her front steps. At the time of EMS arrival, patient was unresponsive with an initial axillary temperature of 84 degrees and only responding to painful stimuli upon presentation. Family denies behavior changes prior to the event. Pt found to have right intertrochanteric fracture of proximal femur also noted to have low blood pressure. R Intramedullary (IM) Nail Introtrochanteric sx done on 02/07/23.    PT Comments  Cleared by RN and MD to get to chair this am.  Per RN, HR ok to go up but MD wants pt OOB for at least 1 hour.  She is assisted to EOB with max a x 2.  Some dizziness noted but BP WFL.  She stands x 1 with max.dependant +2 then on second stand is able to stnd pivot to chair at bedside with max/dependant assist.  Remained in chair.  Will return in about 1 hour to assist nursing back to bed.   If plan is discharge home, recommend the following: A lot of help with walking and/or transfers;A lot of help with bathing/dressing/bathroom;Direct supervision/assist for medications management;Assist for transportation;Help with stairs or ramp for entrance;Assistance with cooking/housework   Can travel by private vehicle        Equipment Recommendations       Recommendations for Other Services       Precautions / Restrictions Precautions Precautions: Fall Precaution Comments: vitals - RN per MD10/21  not concerned about HR.  movement is more important right now so OOB to chair is done Restrictions Weight Bearing Restrictions: Yes RLE Weight Bearing: Weight bearing as tolerated      Mobility  Bed Mobility Overal bed mobility: Needs Assistance Bed Mobility: Supine to Sit     Supine to sit: Max assist, HOB elevated, Total assist, +2 for physical assistance       Patient Response: Cooperative  Transfers Overall transfer level: Needs assistance Equipment used: 2 person hand held assist Transfers: Sit to/from Stand, Bed to chair/wheelchair/BSC Sit to Stand: Max assist, +2 physical assistance Stand pivot transfers: Max assist, Total assist, +2 safety/equipment         General transfer comment: full assist for stand pivot to chair ar bedside.    Ambulation/Gait               General Gait Details: unable   Stairs             Wheelchair Mobility     Tilt Bed Tilt Bed Patient Response: Cooperative  Modified Rankin (Stroke Patients Only)       Balance Overall balance assessment: Needs assistance Sitting-balance support: Feet supported Sitting balance-Leahy Scale: Poor Sitting balance - Comments: +1-2 hands on assist for safety.  some dizziness noted but BP WFL to proceed                                    Cognition Arousal: Alert Behavior During Therapy: WFL for tasks assessed/performed Overall Cognitive Status: Within Functional Limits for tasks assessed  Exercises      General Comments        Pertinent Vitals/Pain Pain Assessment Pain Assessment: Faces Faces Pain Scale: Hurts even more Pain Location: R hip with movement Pain Descriptors / Indicators: Sore, Operative site guarding, Guarding, Grimacing Pain Intervention(s): Limited activity within patient's tolerance, Monitored during session, Repositioned    Home Living                          Prior Function            PT Goals (current goals can now be found in the care plan section) Progress towards PT goals: Progressing toward goals    Frequency    BID      PT  Plan      Co-evaluation              AM-PAC PT "6 Clicks" Mobility   Outcome Measure  Help needed turning from your back to your side while in a flat bed without using bedrails?: Total Help needed moving from lying on your back to sitting on the side of a flat bed without using bedrails?: Total Help needed moving to and from a bed to a chair (including a wheelchair)?: Total Help needed standing up from a chair using your arms (e.g., wheelchair or bedside chair)?: Total Help needed to walk in hospital room?: Total Help needed climbing 3-5 steps with a railing? : Total 6 Click Score: 6    End of Session Equipment Utilized During Treatment: Gait belt Activity Tolerance: Patient tolerated treatment well;Patient limited by fatigue Patient left: in chair;with call bell/phone within reach;with family/visitor present Nurse Communication: Mobility status PT Visit Diagnosis: Unsteadiness on feet (R26.81);Muscle weakness (generalized) (M62.81);Pain Pain - Right/Left: Right Pain - part of body: Hip     Time: 0865-7846 PT Time Calculation (min) (ACUTE ONLY): 14 min  Charges:    $Therapeutic Activity: 8-22 mins PT General Charges $$ ACUTE PT VISIT: 1 Visit                   Danielle Dess, PTA 02/11/23, 10:06 AM

## 2023-02-11 NOTE — Progress Notes (Cosign Needed Addendum)
[facility] CLINIC CARDIOLOGY PROGRESS NOTE   Patient ID: [name] MRN: [id number] DOB/AGE: 1934/09/17 88 y.o.  Admit date: 02/07/2023 Referring Physician Dr. Erin Fulling  Primary Physician Para March, Dwana Curd, MD Primary Cardiologist Dr. Dorothyann Peng Reason for Consultation SVT   HPI: Vanessa [name] is a 88 y.o. female  with a past medical history of SVT, PAF, HTN, CHF, near syncope, COPD, dementia, diabetes mellitus, hypothyroidism who presented to the ED on 02/07/2023 after an unwitnessed fall and found by EMS unresponsive outside her home. She was found at bottom of her front steps. Patient found to fracture her right proximal femur. Cardiology was consulted for further evaluation.   Interval History:  -Patient feeling better overall today. Denies any SOB, chest pain.  -Endorses R hip pain only when she moves.  -HR variable but overall controlled. BP remains borderline. She endorses occasional palpitations.    Review of systems complete and found to be negative unless listed above    Vitals:   02/11/23 0550 02/11/23 0555 02/11/23 0600 02/11/23 0700  BP: (!) 90/51  (!) 88/54 (!) 98/45  Pulse: 95  91 63  Resp: 16  14 18   Temp: 98.8 F (37.1 C)  98.8 F (37.1 C) 99 F (37.2 C)  TempSrc:      SpO2: 98%  97% 96%  Weight:  64.2 kg    Height:         Intake/Output Summary (Last 24 hours) at 02/11/2023 0830 Last data filed at 02/11/2023 0550 Gross per 24 hour  Intake 819.8 ml  Output 1235 ml  Net -415.2 ml     PHYSICAL EXAM General: Ill appearing, well nourished, in no acute distress laying at a slight incline in hospital bed with daughters present at bedside HEENT: Normocephalic and atraumatic. Neck: No JVD.  Lungs: Normal respiratory effort on 1L [place]. Clear bilaterally to auscultation. No crackles, rhonchi.  Heart: Irregularly irregular. Normal S1 and S2 without gallops or murmurs. Radial & DP pulses 2+ bilaterally. Abdomen: Non-distended appearing.  Msk:  Normal strength and tone for age. Extremities: No clubbing, cyanosis or edema.   Neuro: awake and alert    LABS: Basic Metabolic Panel: Recent Labs    02/09/23 0631 02/10/23 0656 02/11/23 0352  NA 137 139 140  K 4.6 3.9 3.4*  CL 108 106 104  CO2 22 24 27   GLUCOSE 149* 108* 86  BUN 27* 24* 18  CREATININE 1.19* 0.83 0.60  CALCIUM 8.3* 8.1* 8.0*  MG 2.3  --  1.9  PHOS 4.4  --   --    Liver Function Tests: Recent Labs    02/10/23 0656  AST 47*  ALT 7  ALKPHOS 37*  BILITOT 0.9  PROT 4.9*  ALBUMIN 2.6*   No results for input(s): "LIPASE", "AMYLASE" in the last 72 hours.  CBC: Recent Labs    02/10/23 0656 02/11/23 0352  WBC 10.5 9.6  HGB 8.4* 8.3*  HCT 26.2* 24.6*  MCV 91.6 89.1  PLT 142* 141*   Cardiac Enzymes: No results for input(s): "CKTOTAL", "CKMB", "CKMBINDEX", "TROPONINIHS" in the last 72 hours.  BNP: Recent Labs    02/09/23 0118 02/11/23 0352  BNP 954.4* 318.0*   D-Dimer: No results for input(s): "DDIMER" in the last 72 hours. Hemoglobin A1C: No results for input(s): "HGBA1C" in the last 72 hours.  Fasting Lipid Panel: No results for input(s): "CHOL", "HDL", "LDLCALC", "TRIG", "CHOLHDL", "LDLDIRECT" in the last 72 hours. Thyroid Function Tests: No results for input(s): "TSH", "T4TOTAL", "  T3FREE", "THYROIDAB" in the last 72 hours.  Invalid input(s): "FREET3"  Anemia Panel: No results for input(s): "VITAMINB12", "FOLATE", "FERRITIN", "TIBC", "IRON", "RETICCTPCT" in the last 72 hours.  No results found.   ECHO unchanged compared to prior  TELEMETRY reviewed by me 02/11/23: atrial flutter rate 90s, episodes of sinus rhythm in the 60s overnight  DATA reviewed by me 02/11/23:  last 24h vitals tele labs imaging I/O ED hospitalist progress note   ASSESSMENT AND PLAN:  Principal Problem:   Septic shock (HCC)   Vanessa [name] is a 88 y.o. female  with a past medical history of SVT, PAF, HTN, CHF, near syncope, COPD, dementia, diabetes  mellitus, hypothyroidism who presented to the ED on 02/07/2023 after an unwitnessed fall and found by EMS unresponsive outside her home. She was found at bottom of her front steps. Patient found to fracture her right proximal femur. Cardiology was consulted for further evaluation.    # SVT, paroxysmal AF/AFL Heart rate is variable. Patient reports occasional palpitations but is without symptoms this AM.  -Continue metoprolol succinate 25 mg twice daily. -PRN IV metoprolol for HR > 135 for better rate control  -Patient previously on anticoagulation in years past but requested stopping this in 2012.   # Hypertension # Chronic HFpEF We will continue to manage conservatively. Patient appears clinically euvolemic. BP borderline hypotensive. -Family does not want any invasive procedures (I.e. TVP/PPM/PA catheter) and they do not want aggressive heart failure management at this time.  -Continue home lasix 20 mg daily.  -Maintain MAP > 65.  -Continue to monitor electrolytes and UO   # COPD Patient is resting comfortable on 1L [place], saturating well. Does not wear O2 at home. -Manage per primary.   # Sepsis, Leukocytosis, Lactic acidosis   -Patient is afebrile -Hgb: trending down <7 yesterday. pRBCs given. Vitals more stable today. -Manage per primary.   # R proximal femur fracture s/p ORIF No swelling or redness around incision site.  -Management per ortho.  This patient's plan of care was discussed and created with Dr. Darrold Junker and he is in agreement.    Signed: Gale Journey, PA-C 02/11/2023, 8:30 AM [facility] Cardiology

## 2023-02-11 NOTE — Progress Notes (Signed)
Palliative Care Progress Note, Assessment & Plan   Patient Name: Vanessa [name]       Date: 02/11/2023 DOB: 1934/09/05  Age: 88 y.o. MRN#: [id number] Attending Physician: Pennie Banter, DO Primary Care Physician: Joaquim Nam, MD Admit Date: 02/07/2023  Subjective: Patient is lying in bed in no apparent distress, on room air.  She acknowledges my presence and is able to make her wishes known.  However, she remains confused, saying she is in [facility] and needs her pocketbook to be able to leave.  Her daughter Vanessa [name] is at bedside during my visit.  HPI: 88 y.o. female  with past medical history of chronic combined systolic and diastolic HF, A-fib, COPD, pulmonary emphysema, type 2 diabetes, hypothyroidism, anemia, and depression admitted on 02/07/2023 with hypothermia after being found down outside of her home.    Patient found to have right intertrochanteric fracture of proximal femur.  10/17, patient underwent right hip IM nail placement.   Patient is hypotensive and tachycardic.   PMT was consulted to discuss goals of care.  Summary of counseling/coordination of care: Extensive chart review completed prior to meeting patient including labs, vital signs, imaging, progress notes, orders, and available advanced directive documents from current and previous encounters.   After reviewing the patient's chart and assessing the patient at bedside, I spoke with patient and her daughter in regards to symptom management and plan of care.  Symptoms assessed.  Patient has no acute complaints at this time.  Daughter shares patient has had episodes of confusion/agitation followed by long periods of somnolence.  She shares she thought the patient was going to pass away late Saturday night as she went  "unresponsive and comatose like".  Discussed patient's chronic comorbidities superimposed on acute injury and hospitalization.  Discussed changes in cognitive baseline function in addition to hospital delirium.  Education provided on heart failure as well as patient's overall functional, cognitive, and nutritional status as significant indicators of her overall prognosis.  Daughter inquired about what end-of-life would look like for patient.  I shared patient is not actively dying and I do not see signs of patient passing away at this time.  However, given her episode on Saturday, Vanessa [name] would like to know more and be able to anticipate her mother's needs if EOL approaches. Kelly and I stepped outside of the room and discussed EOL s/s.  Gone from my sight booklet given to support Vanessa [name] in navigating end-of-life whenever that comes for her mother.    I highlighted several times I do not think the patient is dying but Vanessa [name] just wants to be prepared for anything.  We discussed hoping for the best, planning for the worst, and likely landing somewhere in the middle.  Therapeutic silence, active listening, and emotional support provided.  No change to plan of care at this time.  Ongoing support to continue from PMT.\  Physical Exam Vitals reviewed.  Constitutional:      General: She is not in acute distress.    Appearance: She is normal weight.  HENT:     Head: Normocephalic.     Mouth/Throat:     Mouth: Mucous membranes are moist.  Eyes:  Pupils: Pupils are equal, round, and reactive to light.  Pulmonary:     Effort: Pulmonary effort is normal.  Abdominal:     Palpations: Abdomen is soft.  Musculoskeletal:     Comments: MAETC  Skin:    General: Skin is warm and dry.  Neurological:     Mental Status: She is alert.     Comments: Oriented to self  Psychiatric:        Behavior: Behavior normal.             Total Time 35 minutes   Time spent includes: Detailed review of medical  records (labs, imaging, vital signs), medically appropriate exam (mental status, respiratory, cardiac, skin), discussed with treatment team, counseling and educating patient, family and staff, documenting clinical information, medication management and coordination of care.  Samara Deist L. Bonita Quin, DNP, FNP-BC Palliative Medicine Team

## 2023-02-11 NOTE — Progress Notes (Signed)
Physical Therapy Treatment Patient Details Name: Vanessa [name] MRN: [id number] DOB: 1935-01-24 Today's Date: 02/11/2023   History of Present Illness Pt is a 88 y.o. female who presented to the ED 02/07/23 with family after being found unresponsive outside of her home around 7:15AM. Family reports the Pt was last seen on cameras around 11PM the night before. When she was found this morning, she was at the bottom on her front steps. At the time of EMS arrival, patient was unresponsive with an initial axillary temperature of 84 degrees and only responding to painful stimuli upon presentation. Family denies behavior changes prior to the event. Pt found to have right intertrochanteric fracture of proximal femur also noted to have low blood pressure. R Intramedullary (IM) Nail Introtrochanteric sx done on 02/07/23.    PT Comments  Returned to assist nursing with transfer back to bed.  She remains total assist transfer and transition to supine.  Sits EOB with min assist for safety.  Overall tolerated OOB to chair well today.  Some increased pain with movement but does not limit activity.    If plan is discharge home, recommend the following: A lot of help with walking and/or transfers;A lot of help with bathing/dressing/bathroom;Direct supervision/assist for medications management;Assist for transportation;Help with stairs or ramp for entrance;Assistance with cooking/housework   Can travel by private vehicle        Equipment Recommendations       Recommendations for Other Services       Precautions / Restrictions Precautions Precautions: Fall Precaution Comments: vitals - RN per MD10/21  not concerned about HR.  movement is more important right now so OOB to chair is done Restrictions Weight Bearing Restrictions: Yes RLE Weight Bearing: Weight bearing as tolerated     Mobility  Bed Mobility Overal bed mobility: Needs Assistance Bed Mobility: Sit to Supine     Supine to sit: Max  assist, HOB elevated, Total assist, +2 for physical assistance       Patient Response: Cooperative  Transfers Overall transfer level: Needs assistance Equipment used: 2 person hand held assist Transfers: Sit to/from Stand, Bed to chair/wheelchair/BSC Sit to Stand: Max assist, +2 physical assistance Stand pivot transfers: Max assist, Total assist, +2 safety/equipment         General transfer comment: full assist for stand pivot back to bed    Ambulation/Gait               General Gait Details: unable   Stairs             Wheelchair Mobility     Tilt Bed Tilt Bed Patient Response: Cooperative  Modified Rankin (Stroke Patients Only)       Balance Overall balance assessment: Needs assistance Sitting-balance support: Feet supported Sitting balance-Leahy Scale: Poor Sitting balance - Comments: +1-2 hands on assist for safety.  some dizziness noted but BP WFL to proceed   Standing balance support: Bilateral upper extremity supported Standing balance-Leahy Scale: Zero                              Cognition Arousal: Alert Behavior During Therapy: WFL for tasks assessed/performed Overall Cognitive Status: Within Functional Limits for tasks assessed                                          Exercises  General Comments        Pertinent Vitals/Pain Pain Assessment Pain Assessment: Faces Faces Pain Scale: Hurts even more Pain Location: R hip with movement Pain Descriptors / Indicators: Sore, Operative site guarding, Guarding, Grimacing Pain Intervention(s): Limited activity within patient's tolerance, Monitored during session, Repositioned    Home Living                          Prior Function            PT Goals (current goals can now be found in the care plan section) Progress towards PT goals: Progressing toward goals    Frequency    BID      PT Plan      Co-evaluation               AM-PAC PT "6 Clicks" Mobility   Outcome Measure  Help needed turning from your back to your side while in a flat bed without using bedrails?: Total Help needed moving from lying on your back to sitting on the side of a flat bed without using bedrails?: Total Help needed moving to and from a bed to a chair (including a wheelchair)?: Total Help needed standing up from a chair using your arms (e.g., wheelchair or bedside chair)?: Total Help needed to walk in hospital room?: Total Help needed climbing 3-5 steps with a railing? : Total 6 Click Score: 6    End of Session Equipment Utilized During Treatment: Gait belt Activity Tolerance: Patient tolerated treatment well;Patient limited by fatigue Patient left: with call bell/phone within reach;with family/visitor present;in bed;with bed alarm set;with nursing/sitter in room Nurse Communication: Mobility status PT Visit Diagnosis: Unsteadiness on feet (R26.81);Muscle weakness (generalized) (M62.81);Pain Pain - Right/Left: Right Pain - part of body: Hip     Time: 1610-9604 PT Time Calculation (min) (ACUTE ONLY): 9 min  Charges:    $Therapeutic Activity: 8-22 mins PT General Charges $$ ACUTE PT VISIT: 1 Visit                   Danielle Dess, PTA 02/11/23, 10:42 AM

## 2023-02-11 NOTE — Progress Notes (Addendum)
Progress Note   Patient: Vanessa [name] FYB:[id number] DOB: 1935-01-05 DOA: 02/07/2023     4 DOS: the patient was seen and examined on 02/11/2023   Brief hospital course: 88 yo female who presented to the ED on 02/07/2023 with family after being found unresponsive outside of her home early that morning. Family reports the patient was last seen on cameras around 11PM the previous night. She was found at the bottom on her front steps. At the time of EMS arrival, patient was unresponsive with an initial axillary temperature of 84 degrees and only responding to painful stimuli upon presentation. Family denies behavior changes prior to the event.     In the ED, pt was found to have right intertrochanteric fracture of proximal femur and hypotension and lactic acidosis (LA 7.4>>6.7>>5), mild AKI, septic vs hypovolemic shock, metabolic acidosis, possible aspiration pneumonia.  She was given 2 L IVF's, Bair hugger for hypothermia, IV antibiotics, started on Levophed and IV antibiotics.  Admitted to ICU.   Orthopedic surgery was consulted and pt underwent ORIF with nail on evening of 10/17 with Dr. Joice Lofts.  10/18 - low BP's but off pressors. Family reported hx of baseline lower BP's as well.  Palliative care consulted.  10/19 - [facility] assumed care. Called to bedside early AM due to clinical deterioration with ongoing hypotension MAP's adequate.  Pt minimally responsive but would awaken, follow commands, no focal neurologic deficit.  Discussed guarded to poor prognosis with family at bedside, and potential options for aggressive management versus comfort approach.    Palliative met with family again as well.  Seen by Cardiology and family elected not to pursue advanced HF management, leaning towards comfort care.  Repeat Hbg 6.7, transfused 1 unit pRBC's.  Resumed on antibiotics for aspiration PNA.  Plan is 'watchful waiting' over coming hours to days.  10/20 - pt doing better, Hbg improved w transfusion, 8.4  this AM.  BP's improved, remain soft but stable MAP's consistently.  Pt awake, alert, interactive, denies complaints.   Further hospital course and management as outlined below.   Assessment and Plan:  Septic Shock - resolved. Due to aspiration PNA. Suspect also Hypovolemic component. Presented hypotensive requiring pressor and initial lactic acid 7.4.   Lactate normalized 10/19 to 1.1 Sepsis physiology resolved.  --Monitor fever curve, CBC, hemodynamics --Maintain MAP > 60-65 --Continue midodrine  Aspiration Pneumonia - in setting of fall and prolonged down time, underlying dementia but family deny signs of dysphagia. Repeat CXR 10/19 - R base consolidation and small pleural effusion.  Procal 6.28 (up from 1.26) --Continue Unasyn to complete 5 day course.  D/C on Augmentin to complete.  Right Intertrochanteric Femur Fracture  --Surgical repair 10/17 by Dr. Joice Lofts  --Control pain as needed --PT consult - SNF recommended at this time --TOC following --DVT ppx on hold due to anemia needing transfusion  --SCD's   Acute kidney injury - resolved Cr 1.19 >> 0.83 normalized --Monitor BMP   Hypokalemia - K 3.4 - replaced --Monitor BMP   Acute on Chronic HFpEF BNP 954 this AM, no overt pulmonary edema on CXR, small R pleural effusion Net IO Since Admission: 4,144.97 mL [02/11/23 1328] --Cardiology following --Resumed on home PO Lasix --BNP now improved 954 >> 318 --Daily weights & strict IO's  History of Paroxysmal A-fib, SVT --Cardiology following - see recs --Telemetry --IV metoprolol push for HR sustain > 135 --Metoprolol for HR control as BP tolerates    Acute metabolic encephalopathy - resolved Due to hypothermia,  septic shock Mentation had reportedly returned to baseline prior to clinical worsening today (10/19) --Continue to manage underlying issues as outlined --Address pain with least sedating meds possible  --Delirium precautions --Address bowel and bladder    Hypokalemia - resolved Monitor Bmp, Mg  Replace electrolytes PRN    Acute Blood Loss Anemia 10/19 Hbg 6.7 this AM (from 10 >> ... 7.8 >> 7.3), transfused 1 unit RBC's >> 9.2 Hbg repeat 10/20 Hbg 8.4 No signs of bleeding at hip surgical site DVT prophylaxis on hold, SCD's in place Serial Hbg's to monitor Transfuse for Hbg < 7  Type 2 diabetes - Hbg A1c 7.7%, poorly controlled --Sliding scale Novolog -- reduce from moderate 0-15 units to sensitive 0-9 units given CBG's 78, 83 --Hypoglycemia protocol  --Appears not on meds at home --PCP follow up  COPD - stable, not exacerbated --monitor for hypoxia --Duonebs PRN        Subjective:   Pt seen up in recliner this AM, family at bedside.  She had some hip pain transferring bed to chair, requesting tramadol.  She denies other complaints and asking when she can go home.     Physical Exam: Vitals:   02/11/23 0900 02/11/23 1000 02/11/23 1100 02/11/23 1200  BP: 102/60 107/79 100/64 98/78  Pulse: (!) 131 70 (!) 136 (!) 108  Resp: 18 19 (!) 22 16  Temp: 99.1 F (37.3 C) 99.5 F (37.5 C) 99.5 F (37.5 C) 99.1 F (37.3 C)  TempSrc:    Bladder  SpO2: 91% 93% 93% 92%  Weight:      Height:       General exam: awake sitting up in recliner, alert, no acute distress HEENT: moist mucus membranes, hearing grossly normal  Respiratory system: CTAB anteriorly, diminished right base, no wheezes, normal respiratory effort. Cardiovascular system: normal S1/S2, RRR, +systolic murmur, no BLE edema Central nervous system: A&O x2, normal speech, grossly non-focal exam Extremities: right lateral hip dressings clean dry intact without signs of signficant bleeding, swelling, erythema, SCD's on BLE's Skin: dry, intact, no longer appears pale Psychiatry: normal mood & affect   Data Reviewed:  Notable labs --  WBC improved 12.2 >> 10.5 >> 9.6 Hbg 6.7 >> 9.2 post-transfusion >> 8.4 >> 8.3 Platelets 141k stable procal improving 6.28 >> 3.50  >> 1.86 BNP improved 318  Family Communication: multiple at bedside on rounds this AM   Disposition: Status is: Inpatient Remains inpatient appropriate because: PT/OT evaluations for d/c planning, d/c foley for voiding trial.  If pt remains clinically stable, potential d/c in 24-48 hours    Planned Discharge Destination: SNF vs HH (and 24/7 caregiver support)      Time spent: 42 minutes including time spent at bedside and in coordination of care   Author: Pennie Banter, DO 02/11/2023 1:21 PM  For on call review [email or web address].

## 2023-02-11 NOTE — Consult Note (Signed)
PHARMACY CONSULT NOTE - ELECTROLYTES  Pharmacy Consult for Electrolyte Monitoring and Replacement   Recent Labs: Potassium (mmol/L)  Date Value  02/11/2023 3.4 (L)   Magnesium (mg/dL)  Date Value  95/62/1308 1.9   Calcium (mg/dL)  Date Value  65/78/4696 8.0 (L)   Albumin (g/dL)  Date Value  29/52/8413 2.6 (L)   Phosphorus (mg/dL)  Date Value  24/40/1027 4.4   Sodium (mmol/L)  Date Value  02/11/2023 140  01/08/2018 134   Height: 5\' 4"  (162.6 cm) Weight: 64.2 kg (141 lb 8.6 oz) IBW/kg (Calculated) : 54.7 Estimated Creatinine Clearance: 43.6 mL/min (by C-G formula based on SCr of 0.6 mg/dL).  Assessment  Vanessa [name] is a 88 y.o. female presenting with unresponsiveness after being found down outside after fall. PMH significant for Afib, hypothyroidism, osteopenia, COPD, dementia, CHF. Pharmacy has been consulted to monitor and replace electrolytes.  Diet: PO MIVF: N/A  Goal of Therapy: Electrolytes within normal limits  Plan:  K+ 3.4; KCl 40 mEq PO x 1 F/u with AM labs   Thank you for allowing pharmacy to be a part of this patient's care.  Littie Deeds, PharmD Pharmacy Resident  02/11/2023 7:12 AM

## 2023-02-12 DIAGNOSIS — S72141A Displaced intertrochanteric fracture of right femur, initial encounter for closed fracture: Secondary | ICD-10-CM | POA: Diagnosis not present

## 2023-02-12 DIAGNOSIS — Z515 Encounter for palliative care: Secondary | ICD-10-CM | POA: Diagnosis not present

## 2023-02-12 DIAGNOSIS — A419 Sepsis, unspecified organism: Secondary | ICD-10-CM | POA: Diagnosis not present

## 2023-02-12 DIAGNOSIS — R6521 Severe sepsis with septic shock: Secondary | ICD-10-CM | POA: Diagnosis not present

## 2023-02-12 LAB — CBC
HCT: 28.5 % — ABNORMAL LOW (ref 36.0–46.0)
Hemoglobin: 9.3 g/dL — ABNORMAL LOW (ref 12.0–15.0)
MCH: 30.5 pg (ref 26.0–34.0)
MCHC: 32.6 g/dL (ref 30.0–36.0)
MCV: 93.4 fL (ref 80.0–100.0)
Platelets: 185 10*3/uL (ref 150–400)
RBC: 3.05 MIL/uL — ABNORMAL LOW (ref 3.87–5.11)
RDW: 15.1 % (ref 11.5–15.5)
WBC: 8.4 10*3/uL (ref 4.0–10.5)
nRBC: 0.8 % — ABNORMAL HIGH (ref 0.0–0.2)

## 2023-02-12 LAB — CULTURE, BLOOD (ROUTINE X 2)
Culture: NO GROWTH
Culture: NO GROWTH
Special Requests: ADEQUATE
Special Requests: ADEQUATE

## 2023-02-12 LAB — GLUCOSE, CAPILLARY
Glucose-Capillary: 87 mg/dL (ref 70–99)
Glucose-Capillary: 196 mg/dL — ABNORMAL HIGH (ref 70–99)
Glucose-Capillary: 269 mg/dL — ABNORMAL HIGH (ref 70–99)
Glucose-Capillary: 118 mg/dL — ABNORMAL HIGH (ref 70–99)

## 2023-02-12 LAB — BASIC METABOLIC PANEL
Anion gap: 9 (ref 5–15)
BUN: 16 mg/dL (ref 8–23)
CO2: 27 mmol/L (ref 22–32)
Calcium: 8.2 mg/dL — ABNORMAL LOW (ref 8.9–10.3)
Chloride: 102 mmol/L (ref 98–111)
Creatinine, Ser: 0.56 mg/dL (ref 0.44–1.00)
GFR, Estimated: >60 mL/min (ref >60)
Glucose, Bld: 99 mg/dL (ref 70–99)
Potassium: 3.8 mmol/L (ref 3.5–5.1)
Sodium: 138 mmol/L (ref 135–145)

## 2023-02-12 LAB — MAGNESIUM: Magnesium: 2 mg/dL (ref 1.7–2.4)

## 2023-02-12 LAB — PHOSPHORUS: Phosphorus: 2.8 mg/dL (ref 2.5–4.6)

## 2023-02-12 MED ORDER — PANTOPRAZOLE SODIUM 20 MG PO TBEC
20.0000 mg | DELAYED_RELEASE_TABLET | Freq: Two times a day (BID) | ORAL | Status: DC
Start: 2023-02-12 — End: 2023-02-15
  Administered 2023-02-12 – 2023-02-15 (×5): 20 mg via ORAL
  Filled 2023-02-12 (×7): qty 1

## 2023-02-12 MED ORDER — METOPROLOL SUCCINATE ER 25 MG PO TB24
25.0000 mg | ORAL_TABLET | Freq: Every day | ORAL | Status: DC
  Administered 2023-02-13 – 2023-02-15 (×3): 25 mg via ORAL
  Filled 2023-02-12 (×4): qty 1

## 2023-02-12 MED ORDER — ENSURE ENLIVE PO LIQD
237.0000 mL | Freq: Two times a day (BID) | ORAL | Status: DC
  Administered 2023-02-12 – 2023-02-15 (×5): 237 mL via ORAL

## 2023-02-12 MED ORDER — AMOXICILLIN-POT CLAVULANATE 875-125 MG PO TABS
1.0000 | ORAL_TABLET | Freq: Two times a day (BID) | ORAL | Status: AC
  Administered 2023-02-12 – 2023-02-13 (×4): 1 via ORAL
  Filled 2023-02-12 (×4): qty 1

## 2023-02-12 NOTE — Evaluation (Signed)
Occupational Therapy Evaluation Patient Details Name: Vanessa [name] MRN: [id number] DOB: 1934-07-23 Today's Date: 02/12/2023   History of Present Illness Pt is a 88 y.o. female who presented to the ED 02/07/23 with family after being found unresponsive outside of her home around 7:15AM. Family reports the Pt was last seen on cameras around 11PM the night before. When she was found this morning, she was at the bottom on her front steps. At the time of EMS arrival, patient was unresponsive with an initial axillary temperature of 84 degrees and only responding to painful stimuli upon presentation. Family denies behavior changes prior to the event. Pt found to have right intertrochanteric fracture of proximal femur also noted to have low blood pressure. R Intramedullary (IM) Nail Introtrochanteric sx done on 02/07/23.   Clinical Impression   Vanessa [name] was seen for OT evaluation this date. Prior to hospital admission, pt was MOD I using RW as needed. Pt lives alone with PCA. On arrival pt working with PT, on [facility]. Pt requires MAX A x2 sit<>stand at [facility] x2 trials, +3 assist for pericare standing. SETUP seated drinking tasks and MAX A don B socks in sitting. Pt would benefit from skilled OT to address noted impairments and functional limitations (see below for any additional details). Upon hospital discharge, recommend OT follow up.    If plan is discharge home, recommend the following: Two people to help with walking and/or transfers;Two people to help with bathing/dressing/bathroom;Help with stairs or ramp for entrance    Functional Status Assessment  Patient has had a recent decline in their functional status and demonstrates the ability to make significant improvements in function in a reasonable and predictable amount of time.  Equipment Recommendations  Other (comment) (defer)    Recommendations for Other Services       Precautions / Restrictions Precautions Precautions:  Fall Restrictions Weight Bearing Restrictions: Yes RLE Weight Bearing: Weight bearing as tolerated      Mobility Bed Mobility               General bed mobility comments: not tested    Transfers Overall transfer level: Needs assistance Equipment used: Rolling walker (2 wheels) Transfers: Sit to/from Stand Sit to Stand: Max assist, +2 physical assistance                  Balance Overall balance assessment: Needs assistance Sitting-balance support: Feet supported Sitting balance-Leahy Scale: Fair     Standing balance support: Bilateral upper extremity supported Standing balance-Leahy Scale: Poor                             ADL either performed or assessed with clinical judgement   ADL Overall ADL's : Needs assistance/impaired                                       General ADL Comments: MAX A x2 for BSC t/f, +3 assist for pericare standing. SETUP seated drinking tasks and MAX A don B socks in sitting      Pertinent Vitals/Pain Pain Assessment Pain Assessment: Faces Faces Pain Scale: Hurts even more Pain Location: R hip with movement Pain Descriptors / Indicators: Sore, Operative site guarding, Guarding, Grimacing Pain Intervention(s): Limited activity within patient's tolerance, Repositioned     Extremity/Trunk Assessment Upper Extremity Assessment Upper Extremity Assessment: Generalized weakness;Overall [facility] for tasks assessed  Lower Extremity Assessment Lower Extremity Assessment: Generalized weakness       Communication Communication Communication: No apparent difficulties Cueing Techniques: Verbal cues   Cognition Arousal: Alert Behavior During Therapy: WFL for tasks assessed/performed Overall Cognitive Status: Within Functional Limits for tasks assessed                                                  Home Living Family/patient expects to be discharged to:: Private residence Living  Arrangements: Alone Available Help at Discharge: Family;Personal care attendant;Available 24 hours/day (Personal aides hours are from 9AM-12PM with 2 hour break and 2-9 PM) Type of Home: House Home Access: Stairs to enter [employer] of Steps: 1 STE front door; 5 steps out back Entrance Stairs-Rails: Can reach both;Left;Right Home Layout: One level     Bathroom Shower/Tub: Chief Strategy Officer: Standard     Home Equipment: Tub bench;Grab bars - tub/shower;Hand held Armed forces logistics/support/administrative officer (2 wheels)   Additional Comments: Pt's daughter Vanessa [name] reports Pt has 3 RW placed outside room, at bedside, and in kitchen      Prior Functioning/Environment Prior Level of Function : Independent/Modified Independent             Mobility Comments: Daughter reports Pt amb without AD but has 3 RWs in the event she feels "unsteady"; no reports of falls in the last 6 months ADLs Comments: Personal aides assist with ADL/IADLs but Pt reports she can self-feed; Pt reports she does not drive        OT Problem List: Decreased strength;Decreased range of motion;Decreased activity tolerance;Impaired balance (sitting and/or standing)      OT Treatment/Interventions: Self-care/ADL training;Therapeutic exercise;Energy conservation;DME and/or AE instruction;Therapeutic activities;Patient/family education;Balance training    OT Goals(Current goals can be found in the care plan section) Acute Rehab OT Goals Patient Stated Goal: to go home OT Goal Formulation: With patient/family Time For Goal Achievement: 02/26/23 Potential to Achieve Goals: Fair ADL Goals Pt Will Perform Grooming: with set-up;with supervision;sitting Pt Will Perform Lower Body Dressing: sitting/lateral leans;with min assist Pt Will Transfer to Toilet: with mod assist;stand pivot transfer;bedside commode Pt Will Perform Toileting - Clothing Manipulation and hygiene: with mod assist;sit to/from stand   OT Frequency: Min 1X/week    Co-evaluation PT/OT/SLP Co-Evaluation/Treatment: Yes Reason for Co-Treatment: Complexity of the patient's impairments (multi-system involvement) PT goals addressed during session: Mobility/safety with mobility OT goals addressed during session: ADL's and self-care      AM-PAC OT "6 Clicks" Daily Activity     Outcome Measure Help from another person eating meals?: None Help from another person taking care of personal grooming?: A Little Help from another person toileting, which includes using toliet, bedpan, or urinal?: A Lot Help from another person bathing (including washing, rinsing, drying)?: A Lot Help from another person to put on and taking off regular upper body clothing?: A Little Help from another person to put on and taking off regular lower body clothing?: A Lot 6 Click Score: 16   End of Session Equipment Utilized During Treatment: Gait belt;Rolling walker (2 wheels) Nurse Communication: Mobility status  Activity Tolerance: Patient tolerated treatment well Patient left: in chair;with call bell/phone within reach;with family/visitor present  OT Visit Diagnosis: Other abnormalities of gait and mobility (R26.89);Muscle weakness (generalized) (M62.81)  Time: 0981-1914 OT Time Calculation (min): 9 min Charges:  OT General Charges $OT Visit: 1 Visit OT Evaluation $OT Eval Low Complexity: 1 Low  Kathie Dike, M.S. OTR/L  02/12/23, 10:33 AM  ascom [phone]

## 2023-02-12 NOTE — NC FL2 (Signed)
[place] MEDICAID FL2 LEVEL OF CARE FORM     IDENTIFICATION  Patient Name: Vanessa [name] Birthdate: 11/19/1934 Sex: female Admission Date (Current Location): 02/07/2023  [place] Number:  Chiropodist and Address:  [facility], [address] [place], [place] [address]      Provider Number: [id number]  Attending Physician Name and Address:  Pennie Banter, DO  Relative Name and Phone Number:  [name],[name] (Daughter)  [phone] (Home Phone)    Current Level of Care: Hospital Recommended Level of Care: Skilled Nursing Facility Prior Approval Number:    Date Approved/Denied:   PASRR Number: [id number]  Discharge Plan: SNF    Current Diagnoses: Patient Active Problem List   Diagnosis Date Noted   Septic shock (HCC) 02/07/2023   History of UTI 11/28/2022   COPD with acute exacerbation (HCC) 08/03/2022   Hypokalemia 08/03/2022   Syncope 08/03/2022   Dementia (HCC) 08/03/2022   Prolonged QT interval 08/03/2022   AKI (acute kidney injury) (HCC) 08/03/2022   Chronic combined systolic and diastolic CHF (congestive heart failure) (HCC) 08/03/2022   Dysuria 09/24/2021   Dizziness of unknown cause 09/21/2021   B12 deficiency 03/22/2019   Memory change 03/18/2019   Pelvic fracture (HCC) 10/05/2018   Abnormal weight loss 08/19/2017   Hypotension 01/18/2016   Fatigue 07/12/2015   Hyperglycemia 05/25/2014   OA (osteoarthritis) 05/25/2014   Adjustment disorder 01/07/2014   Environmental allergies 12/13/2013   Arthritis 06/27/2013   Carotid stenosis 11/04/2012   Migraine 11/12/2011   Medicare annual wellness visit, subsequent 05/15/2011   Osteopenia 01/25/2011   Hypothyroid 01/25/2011   Advance directive discussed with patient 01/24/2011   Atrial fibrillation (HCC) 08/17/2010   Hyperlipidemia 08/17/2010    Orientation RESPIRATION BLADDER Height & Weight     Self, Place  O2 (HFNC 2 liters) External catheter,  Incontinent Weight: 61.1 kg Height:  5\' 4"  (162.6 cm)  BEHAVIORAL SYMPTOMS/MOOD NEUROLOGICAL BOWEL NUTRITION STATUS  Other (Comment) (n/a)  (n/a) Continent Diet (Heart)  AMBULATORY STATUS COMMUNICATION OF NEEDS Skin   Limited Assist Verbally Skin abrasions, Bruising (Abrasions- arm and elbow, ecchymosis- arm and hands)                       Personal Care Assistance Level of Assistance  Bathing, Dressing Bathing Assistance: Limited assistance   Dressing Assistance: Limited assistance     Functional Limitations Info  Hearing   Hearing Info: Impaired      SPECIAL CARE FACTORS FREQUENCY  PT (By licensed PT), OT (By licensed OT)     PT Frequency: Min2 x weekly OT Frequency: Min2 x weekly            Contractures Contractures Info: Not present    Additional Factors Info  Code Status, Allergies Code Status Info: DNR Allergies Info: Codeine, Cortisone, Decongestant (Pseudoephedrine Hcl), Donepezil, Naproxen, Simvastatin           Current Medications (02/12/2023):  This is the current hospital active medication list Current Facility-Administered Medications  Medication Dose Route Frequency Provider Last Rate Last Admin   acetaminophen (TYLENOL) tablet 325-650 mg  325-650 mg Oral Q6H PRN Poggi, Excell Seltzer, MD   650 mg at 02/10/23 0523   Ampicillin-Sulbactam (UNASYN) 3 g in sodium chloride 0.9 % 100 mL IVPB  3 g Intravenous Q6H Dorothea Ogle B, RPH 200 mL/hr at 02/12/23 0834 3 g at 02/12/23 0834   bisacodyl (DULCOLAX) suppository 10 mg  10 mg Rectal Daily  PRN Poggi, Excell Seltzer, MD       Chlorhexidine Gluconate Cloth 2 % PADS 6 each  6 each Topical Daily Rust-Chester, Cecelia Byars, NP   6 each at 02/11/23 1000   diphenhydrAMINE (BENADRYL) 12.5 MG/5ML elixir 12.5-25 mg  12.5-25 mg Oral Q4H PRN Poggi, Excell Seltzer, MD       docusate sodium (COLACE) capsule 100 mg  100 mg Oral BID Christena Flake, MD   100 mg at 02/12/23 0824   feeding supplement (ENSURE ENLIVE / ENSURE PLUS) liquid 237 mL   237 mL Oral BID BM Esaw Grandchild A, DO   237 mL at 02/12/23 0820   furosemide (LASIX) tablet 20 mg  20 mg Oral Daily Custovic, Sabina, DO   20 mg at 02/12/23 1610   HYDROcodone-acetaminophen (NORCO/VICODIN) 5-325 MG per tablet 1-2 tablet  1-2 tablet Oral Q4H PRN Poggi, Excell Seltzer, MD       insulin aspart (novoLOG) injection 0-9 Units  0-9 Units Subcutaneous TID WC Esaw Grandchild A, DO   1 Units at 02/11/23 1618   ipratropium-albuterol (DUONEB) 0.5-2.5 (3) MG/3ML nebulizer solution 3 mL  3 mL Nebulization Q4H PRN Esaw Grandchild A, DO       levothyroxine (SYNTHROID) tablet 25 mcg  25 mcg Oral Q0600 Esaw Grandchild A, DO   25 mcg at 02/12/23 0501   magnesium hydroxide (MILK OF MAGNESIA) suspension 30 mL  30 mL Oral Daily PRN Poggi, Excell Seltzer, MD       melatonin tablet 2.5 mg  2.5 mg Oral QHS PRN Rust-Chester, Cecelia Byars, NP   2.5 mg at 02/08/23 2115   metoCLOPramide (REGLAN) tablet 5-10 mg  5-10 mg Oral Q8H PRN Poggi, Excell Seltzer, MD       Or   metoCLOPramide (REGLAN) injection 5-10 mg  5-10 mg Intravenous Q8H PRN Poggi, Excell Seltzer, MD   10 mg at 02/07/23 2221   metoprolol succinate (TOPROL-XL) 24 hr tablet 25 mg  25 mg Oral BID Custovic, Sabina, DO   25 mg at 02/12/23 0824   metoprolol tartrate (LOPRESSOR) injection 5 mg  5 mg Intravenous Q15 min PRN Custovic, Sabina, DO       midodrine (PROAMATINE) tablet 10 mg  10 mg Oral TID WC Erin Fulling, MD   10 mg at 02/12/23 9604   morphine (PF) 2 MG/ML injection 0.5-1 mg  0.5-1 mg Intravenous Q2H PRN Poggi, Excell Seltzer, MD   1 mg at 02/10/23 0033   Oral care mouth rinse  15 mL Mouth Rinse PRN Poggi, Excell Seltzer, MD       polyethylene glycol (MIRALAX / GLYCOLAX) packet 17 g  17 g Oral Daily Esaw Grandchild A, DO   17 g at 02/12/23 5409   senna-docusate (Senokot-S) tablet 1 tablet  1 tablet Oral BID Esaw Grandchild A, DO   1 tablet at 02/12/23 8119   sodium phosphate (FLEET) enema 1 enema  1 enema Rectal Once PRN Poggi, Excell Seltzer, MD       traMADol Janean Sark) tablet 50 mg  50 mg Oral  Q6H PRN Esaw Grandchild A, DO   50 mg at 02/11/23 1636     Discharge Medications: Please see discharge summary for a list of discharge medications.  Relevant Imaging Results:  Relevant Lab Results:   Additional Information SSN# [id number]  Truddie Hidden, RN

## 2023-02-12 NOTE — TOC Progression Note (Signed)
Transition of Care [facility]) - Progression Note    Patient Details  Name: Vanessa [name] MRN: [id number] Date of Birth: Aug 23, 1934  Transition of Care [facility]) CM/SW Contact  Truddie Hidden, RN Phone Number: 02/12/2023, 9:25 AM  Clinical Narrative:    Spoke with patient's daughter, Albin Felling regarding SNF recommendation. She is agreeable to SNF and would like [facility], [facility] or [facility]. She has been advised insurance auth would be required prior to admission.          Expected Discharge Plan and Services                                               Social Determinants of Health (SDOH) Interventions SDOH Screenings   Food Insecurity: No Food Insecurity (02/08/2023)  Housing: Low Risk  (02/08/2023)  Transportation Needs: No Transportation Needs (02/08/2023)  Utilities: Not At Risk (02/08/2023)  Alcohol Screen: Low Risk  (06/13/2022)  Depression (PHQ2-9): Low Risk  (08/10/2022)  Financial Resource Strain: Low Risk  (08/10/2022)  Physical Activity: Inactive (08/10/2022)  Social Connections: Unknown (08/10/2022)  Recent Concern: Social Connections - Moderately Isolated (06/13/2022)  Stress: No Stress Concern Present (08/10/2022)  Tobacco Use: Medium Risk (02/07/2023)    Readmission Risk Interventions     No data to display

## 2023-02-12 NOTE — Progress Notes (Signed)
[facility] CLINIC CARDIOLOGY PROGRESS NOTE   Patient ID: Vanessa [name] MRN: [id number] DOB/AGE: December 02, 1934 88 y.o.  Admit date: 02/07/2023 Referring Physician Dr. Erin Fulling  Primary Physician Para March, Dwana Curd, MD Primary Cardiologist Dr. Dorothyann Peng Reason for Consultation SVT   HPI: Vanessa [name] is a 88 y.o. female  with a past medical history of SVT, PAF, HTN, CHF, near syncope, COPD, dementia, diabetes mellitus, hypothyroidism who presented to the ED on 02/07/2023 after an unwitnessed fall and found by EMS unresponsive outside her home. She was found at bottom of her front steps. Patient found to fracture her right proximal femur. Cardiology was consulted for further evaluation.   Interval History:  -Patient feeling much better today, OOB with PT which she tolerated well.  -HR much improved, remained in sinus overnight with episodes of AF with rates in the 100s. Denies palpitations this AM.  -BP remains borderline. She denies dizziness/lightheadedness.     Review of systems complete and found to be negative unless listed above    Vitals:   02/12/23 0600 02/12/23 0700 02/12/23 0800 02/12/23 0900  BP: 112/60 (!) 138/98 92/67 (!) 102/56  Pulse: 61 61 66   Resp: 15 17 17 18   Temp:      TempSrc:      SpO2: 100% 100% 100%   Weight:      Height:         Intake/Output Summary (Last 24 hours) at 02/12/2023 1124 Last data filed at 02/12/2023 0900 Gross per 24 hour  Intake 1162.8 ml  Output 1550 ml  Net -387.2 ml    PHYSICAL EXAM  General: Well nourished, in no acute distress sitting upright in bedside chair HEENT: Normocephalic and atraumatic. Neck: No JVD.  Lungs: Normal respiratory effort on room air. Clear bilaterally to auscultation. No crackles, rhonchi.  Heart: Irregularly irregular. Normal S1 and S2 without gallops or murmurs. Radial & DP pulses 2+ bilaterally. Abdomen: Non-distended appearing.  Msk: Normal strength and tone for age. Extremities: No  clubbing, cyanosis or edema.   Neuro: awake and alert   LABS: Basic Metabolic Panel: Recent Labs    02/11/23 0352 02/12/23 0349  NA 140 138  K 3.4* 3.8  CL 104 102  CO2 27 27  GLUCOSE 86 99  BUN 18 16  CREATININE 0.60 0.56  CALCIUM 8.0* 8.2*  MG 1.9 2.0  PHOS  --  2.8   Liver Function Tests: Recent Labs    02/10/23 0656  AST 47*  ALT 7  ALKPHOS 37*  BILITOT 0.9  PROT 4.9*  ALBUMIN 2.6*   No results for input(s): "LIPASE", "AMYLASE" in the last 72 hours.  CBC: Recent Labs    02/11/23 0352 02/12/23 0349  WBC 9.6 8.4  HGB 8.3* 9.3*  HCT 24.6* 28.5*  MCV 89.1 93.4  PLT 141* 185   Cardiac Enzymes: No results for input(s): "CKTOTAL", "CKMB", "CKMBINDEX", "TROPONINIHS" in the last 72 hours.  BNP: Recent Labs    02/11/23 0352  BNP 318.0*   D-Dimer: No results for input(s): "DDIMER" in the last 72 hours. Hemoglobin A1C: No results for input(s): "HGBA1C" in the last 72 hours.  Fasting Lipid Panel: No results for input(s): "CHOL", "HDL", "LDLCALC", "TRIG", "CHOLHDL", "LDLDIRECT" in the last 72 hours. Thyroid Function Tests: No results for input(s): "TSH", "T4TOTAL", "T3FREE", "THYROIDAB" in the last 72 hours.  Invalid input(s): "FREET3"  Anemia Panel: No results for input(s): "VITAMINB12", "FOLATE", "FERRITIN", "TIBC", "IRON", "RETICCTPCT" in the last 72 hours.  No results  found.   ECHO unchanged compared to prior  TELEMETRY reviewed by me 02/12/23: sinus rhythm rate 60s, paroxysms of AF rate 100s  DATA reviewed by me 02/12/23:  last 24h vitals tele labs imaging I/O ED hospitalist progress note   ASSESSMENT AND PLAN:  Principal Problem:   Septic shock (HCC)   Vanessa [name] is a 88 y.o. female  with a past medical history of SVT, PAF, HTN, CHF, near syncope, COPD, dementia, diabetes mellitus, hypothyroidism who presented to the ED on 02/07/2023 after an unwitnessed fall and found by EMS unresponsive outside her home. She was found at bottom  of her front steps. Patient found to fracture her right proximal femur. Cardiology was consulted for further evaluation.    # SVT, paroxysmal AF/AFL Heart rate is variable. Patient reports occasional palpitations but is without symptoms this AM. HR controlled - maintaining in SR overnight into the AM in the 60s with paroxysms of AF when working with PT. -Continue metoprolol succinate 25 mg daily. -PRN IV metoprolol for HR > 135 for better rate control  -Patient previously on anticoagulation in years past but requested stopping this in 2012. Discussed stroke risk related to atrial fibrillation with daughter, [facility] deferred historically due to concern for falls/bleeding.    # Hypertension # Chronic HFpEF We will continue to manage conservatively. Patient appears clinically euvolemic. BP borderline hypotensive. -Family does not want any invasive procedures. -Continue home lasix 20 mg daily.  -Maintain MAP > 65.  -Continue to monitor electrolytes and UO   # COPD Patient is resting comfortable on room air, saturating well. Does not wear O2 at home. -Manage per primary.   # Sepsis, Leukocytosis, Lactic acidosis   Transitioned to po antibiotics from IV. -Management per primary.   # R proximal femur fracture s/p ORIF No swelling or redness around incision site.  -Management per ortho.  Cardiology will sign off. Please haiku with questions or re-engage if needed.    This patient's plan of care was discussed and created with Dr. Darrold Junker and he is in agreement.    Signed: Gale Journey, PA-C 02/12/2023, 11:24 AM [facility] Cardiology

## 2023-02-12 NOTE — Plan of Care (Signed)
  Problem: Clinical Measurements: ?Goal: Will remain free from infection ?Outcome: Progressing ?Goal: Diagnostic test results will improve ?Outcome: Progressing ?Goal: Respiratory complications will improve ?Outcome: Progressing ?Goal: Cardiovascular complication will be avoided ?Outcome: Progressing ?  ?Problem: Pain Managment: ?Goal: General experience of comfort will improve ?Outcome: Progressing ?  ?Problem: Safety: ?Goal: Ability to remain free from injury will improve ?Outcome: Progressing ?  ?

## 2023-02-12 NOTE — TOC Progression Note (Signed)
Transition of Care [facility]) - Progression Note    Patient Details  Name: Vanessa [name] MRN: [id number] Date of Birth: 1934/06/03  [facility] CM/SW Contact  Truddie Hidden, RN Phone Number: 02/12/2023, 9:28 AM  Clinical Narrative: Bed search started.              Expected Discharge Plan and Services                                               Social Determinants of Health (SDOH) Interventions SDOH Screenings   Food Insecurity: No Food Insecurity (02/08/2023)  Housing: Low Risk  (02/08/2023)  Transportation Needs: No Transportation Needs (02/08/2023)  Utilities: Not At Risk (02/08/2023)  Alcohol Screen: Low Risk  (06/13/2022)  Depression (PHQ2-9): Low Risk  (08/10/2022)  Financial Resource Strain: Low Risk  (08/10/2022)  Physical Activity: Inactive (08/10/2022)  Social Connections: Unknown (08/10/2022)  Recent Concern: Social Connections - Moderately Isolated (06/13/2022)  Stress: No Stress Concern Present (08/10/2022)  Tobacco Use: Medium Risk (02/07/2023)    Readmission Risk Interventions     No data to display

## 2023-02-12 NOTE — Progress Notes (Signed)
Palliative Care Progress Note, Assessment & Plan   Patient Name: Vanessa [name]       Date: 02/12/2023 DOB: 1934-09-24  Age: 88 y.o. MRN#: [id number] Attending Physician: Pennie Banter, DO Primary Care Physician: Joaquim Nam, MD Admit Date: 02/07/2023  Subjective: Patient is sitting up in bed in no apparent distress.  She acknowledges my presence and is able to make her wishes known.  She is alert to self but not to place, time, or situation.  Her daughter Vanessa [name] is at bedside during my visit.  HPI: 88 y.o. female  with past medical history of chronic combined systolic and diastolic HF, A-fib, COPD, pulmonary emphysema, type 2 diabetes, hypothyroidism, anemia, and depression admitted on 02/07/2023 with hypothermia after being found down outside of her home.    Patient found to have right intertrochanteric fracture of proximal femur.  10/17, patient underwent right hip IM nail placement.   Patient is hypotensive and tachycardic.   PMT was consulted to discuss goals of care.  Summary of counseling/coordination of care: Extensive chart review completed prior to meeting patient including labs, vital signs, imaging, progress notes, orders, and available advanced directive documents from current and previous encounters.   After reviewing the patient's chart and assessing the patient at bedside, I spoke with patient and daughter in regards to symptom management and boundaries of care.  Symptoms assessed.  Patient has no acute complaints at this time.  Heart rate remains controlled.  Daughter endorses some agitation overnight and emotional lability present this AM.  She reports patient is crying, laughing, speaking to those who have passed on, and then can have very lucid discussions.  We discussed  underlying mild cognitive impairment that may have been exacerbated by acute hospitalization, surgery, and anesthesia that may be superimposed on hospital delirium.  Medically patient is improving but her cognitive status is significantly different from baseline.  Cholera remains hopeful that more time will allow her mother to have improvements.  She is planning for patient to go to rehab and then layering and care at home.  I assured her TOC is following closely for disposition planning.  Symptom burden is low.  Goals are clear.  Patient/family have PMT contact information and were encouraged to contact PMT with any future acute palliative needs.   PMT will monitor the patient peripherally and shadow her chart. Please re-engage with PMT if goals change, at patient/family's request, or if patient's health deteriorates during hospitalization.    Physical Exam Vitals reviewed.  Constitutional:      General: She is not in acute distress.    Appearance: She is normal weight.  HENT:     Head: Normocephalic.     Mouth/Throat:     Mouth: Mucous membranes are moist.  Eyes:     Pupils: Pupils are equal, round, and reactive to light.  Pulmonary:     Effort: Pulmonary effort is normal.  Abdominal:     Palpations: Abdomen is soft.  Musculoskeletal:     Comments: Generalized weakness  Skin:    General: Skin is warm and dry.  Neurological:     Mental Status: She is alert.     Comments: Oriented to self  Psychiatric:        Judgment: Judgment normal.             Total Time 35 minutes   Time spent includes: Detailed review of medical records (labs, imaging, vital signs), medically appropriate exam (mental status, respiratory, cardiac, skin), discussed with treatment team, counseling and educating patient, family and staff, documenting clinical information, medication management and coordination of care.  Samara Deist L. Bonita Quin, DNP, FNP-BC Palliative Medicine Team

## 2023-02-12 NOTE — Progress Notes (Signed)
Progress Note   Patient: Vanessa [name] WUJ:[id number] DOB: 08-18-1934 DOA: 02/07/2023     5 DOS: the patient was seen and examined on 02/12/2023   Brief hospital course: 88 yo female who presented to the ED on 02/07/2023 with family after being found unresponsive outside of her home early that morning. Family reports the patient was last seen on cameras around 11PM the previous night. She was found at the bottom on her front steps. At the time of EMS arrival, patient was unresponsive with an initial axillary temperature of 84 degrees and only responding to painful stimuli upon presentation. Family denies behavior changes prior to the event.     In the ED, pt was found to have right intertrochanteric fracture of proximal femur and hypotension and lactic acidosis (LA 7.4>>6.7>>5), mild AKI, septic vs hypovolemic shock, metabolic acidosis, possible aspiration pneumonia.  She was given 2 L IVF's, Bair hugger for hypothermia, IV antibiotics, started on Levophed and IV antibiotics.  Admitted to ICU.   Orthopedic surgery was consulted and pt underwent ORIF with nail on evening of 10/17 with Dr. Joice Lofts.  10/18 - low BP's but off pressors. Family reported hx of baseline lower BP's as well.  Palliative care consulted.  10/19 - TRH assumed care. Called to bedside early AM due to clinical deterioration with ongoing hypotension MAP's adequate.  Pt minimally responsive but would awaken, follow commands, no focal neurologic deficit.  Discussed guarded to poor prognosis with family at bedside, and potential options for aggressive management versus comfort approach.    Palliative met with family again as well.  Seen by Cardiology and family elected not to pursue advanced HF management, leaning towards comfort care.  Repeat Hbg 6.7, transfused 1 unit pRBC's.  Resumed on antibiotics for aspiration PNA.  Plan is 'watchful waiting' over coming hours to days.  10/20 - pt doing better, Hbg improved w transfusion, 8.4  this AM.  BP's improved, remain soft but stable MAP's consistently.  Pt awake, alert, interactive, denies complaints.   Further hospital course and management as outlined below.   Assessment and Plan:  Septic Shock - resolved. Due to aspiration PNA. Suspect also Hypovolemic component. Presented hypotensive requiring pressor and initial lactic acid 7.4.   Lactate normalized 10/19 to 1.1 Sepsis physiology resolved.  --Monitor fever curve, CBC, hemodynamics --Maintain MAP > 60-65 --Continue midodrine, wean as BP tolerates (may need in order to tolerate metop)  Aspiration Pneumonia - in setting of fall and prolonged down time, underlying dementia but family deny signs of dysphagia. Repeat CXR 10/19 - R base consolidation and small pleural effusion.  Procal 6.28 (up from 1.26) --IV Unasyn >> PO Augmentin to complete 5 day course  Right Intertrochanteric Femur Fracture  10/22 -- oozing from site reported, daughter stated ortho might place wound vac --Surgical repair 10/17 by Dr. Joice Lofts  --Control pain as needed --PT consult - SNF recommended & TOC working on placement --DVT ppx on hold due to anemia needing transfusion  --SCD's   Acute kidney injury - resolved Cr 1.19 >> 0.83 normalized --Monitor BMP   Hypokalemia - K 3.4 - replaced --Monitor BMP   Acute on Chronic HFpEF BNP 954 this AM, no overt pulmonary edema on CXR, small R pleural effusion Net IO Since Admission: 4,252.02 mL [02/12/23 1615] --Cardiology following --Resumed on home PO Lasix --BNP now improved 954 >> 318 --Daily weights & strict IO's  History of Paroxysmal A-fib, SVT --Cardiology following - see recs --Telemetry --IV metoprolol push for HR  sustain > 135 --Metoprolol for HR control as BP tolerates    Acute metabolic encephalopathy - resolved Due to hypothermia, septic shock Mentation had reportedly returned to baseline prior to clinical worsening today (10/19) --Continue to manage underlying issues as  outlined --Address pain with least sedating meds possible  --Delirium precautions --Address bowel and bladder   Hypokalemia - resolved Monitor Bmp, Mg  Replace electrolytes PRN    Acute Blood Loss Anemia 10/19 Hbg 6.7 this AM (from 10 >> ... 7.8 >> 7.3), transfused 1 unit RBC's >> 9.2 Hbg repeat 10/22 Hbg improved 9.3 No significant bleeding from surgical site reported DVT prophylaxis on hold, SCD's in place Serial Hbg's to monitor Transfuse for Hbg < 7  Type 2 diabetes - Hbg A1c 7.7%, poorly controlled --Sliding scale Novolog -- reduce from moderate 0-15 units to sensitive 0-9 units given CBG's 78, 83 --Hypoglycemia protocol  --Appears not on meds at home --PCP follow up  COPD - stable, not exacerbated --monitor for hypoxia --Duonebs PRN  GERD -  --resume PPI        Subjective:   Pt seen awake sitting up in bed, daughter at bedside.  Pt had oozing from her hip surgical site & ortho was notified. There was mention of placing wound vac, per daughter.  I was not informed of any issues or plans from team otherwise.  Pt denies complaints, only mild pain which is okay at rest.  No other complaints. Wants to go home soon.    Physical Exam: Vitals:   02/12/23 1200 02/12/23 1300 02/12/23 1400 02/12/23 1500  BP: 109/65 (!) 103/57 (!) 104/55 (!) 91/52  Pulse:  (!) 101 70 73  Resp: 17 19 20 16   Temp:      TempSrc:      SpO2:   99% 99%  Weight:      Height:       General exam: awake, alert, no acute distress HEENT: moist mucus membranes, hearing grossly normal  Respiratory system: CTAB, no wheezes, rales or rhonchi, normal respiratory effort. Cardiovascular system: normal S1/S2, RRR, no JVD, murmurs, rubs, gallops, no pedal edema.   Gastrointestinal system: soft, NT, ND Central nervous system: A&O x2. no gross focal neurologic deficits, normal speech Extremities: left lateral hip dressing clean dry intact (ABD and gauze, honeycomb dressing removed) Skin: dry, intact,  normal temperature, normal color Psychiatry: normal mood, congruent affect    Data Reviewed:  Notable labs --   Hbg 6.7 >> 9.2 post-transfusion >> 8.4 >> 8.3 >> 9.3 Ca 8.2 otherwise normal BMP  CBG's 128 >> 87 >> 196 >> 269  Family Communication: daughter at bedside on rounds this AM   Disposition: Status is: Inpatient Remains inpatient appropriate because: SNF placement pending. Medically stable.    Planned Discharge Destination: SNF       Time spent: 35 minutes   Author: Pennie Banter, DO 02/12/2023 4:15 PM  For on call review [email or web address].

## 2023-02-12 NOTE — TOC Progression Note (Signed)
Transition of Care [facility]) - Progression Note    Patient Details  Name: Vanessa [name] MRN: [id number] Date of Birth: October 10, 1934  [facility]) CM/SW Contact  Truddie Hidden, RN Phone Number: 02/12/2023, 4:21 PM  Clinical Narrative:    Retrieved call from patient's daughter , Albin Felling. She would like referral sent to [facility]  Referral sent to [facility].          Expected Discharge Plan and Services                                               Social Determinants of Health (SDOH) Interventions SDOH Screenings   Food Insecurity: No Food Insecurity (02/08/2023)  Housing: Low Risk  (02/08/2023)  Transportation Needs: No Transportation Needs (02/08/2023)  Utilities: Not At Risk (02/08/2023)  Alcohol Screen: Low Risk  (06/13/2022)  Depression (PHQ2-9): Low Risk  (08/10/2022)  Financial Resource Strain: Low Risk  (08/10/2022)  Physical Activity: Inactive (08/10/2022)  Social Connections: Unknown (08/10/2022)  Recent Concern: Social Connections - Moderately Isolated (06/13/2022)  Stress: No Stress Concern Present (08/10/2022)  Tobacco Use: Medium Risk (02/07/2023)    Readmission Risk Interventions     No data to display

## 2023-02-12 NOTE — Progress Notes (Signed)
Physical Therapy Treatment Patient Details Name: Vanessa [name] MRN: [id number] DOB: Nov 03, 1934 Today's Date: 02/12/2023   History of Present Illness Pt is a 88 y.o. female who presented to the ED 02/07/23 with family after being found unresponsive outside of her home around 7:15AM. Family reports the Pt was last seen on cameras around 11PM the night before. When she was found this morning, she was at the bottom on her front steps. At the time of EMS arrival, patient was unresponsive with an initial axillary temperature of 84 degrees and only responding to painful stimuli upon presentation. Family denies behavior changes prior to the event. Pt found to have right intertrochanteric fracture of proximal femur also noted to have low blood pressure. R Intramedullary (IM) Nail Introtrochanteric sx done on 02/07/23.    PT Comments  Pt tolerated 90 minutes out of bed today.  She is assisted to stand with mod a x 2 and does a good job in static standing holding her weight with BLE's.  She does need increased assist when turning to bed to max a x 2 but overall a good improvement from yesterday.  Max a x 2 to transition to supine.   If plan is discharge home, recommend the following: Direct supervision/assist for medications management;Assist for transportation;Help with stairs or ramp for entrance;Assistance with cooking/housework;Two people to help with walking and/or transfers;Two people to help with bathing/dressing/bathroom   Can travel by private vehicle        Equipment Recommendations  None recommended by PT    Recommendations for Other Services       Precautions / Restrictions Precautions Precautions: Fall Precaution Comments: vitals - RN per MD10/21  not concerned about HR.  movement is more important right now so OOB to chair is done Restrictions Weight Bearing Restrictions: Yes RLE Weight Bearing: Weight bearing as tolerated     Mobility  Bed Mobility Overal bed mobility: Needs  Assistance Bed Mobility: Sit to Supine     Supine to sit: Max assist, +2 for physical assistance Sit to supine: Max assist, +2 for physical assistance     Patient Response: Cooperative  Transfers Overall transfer level: Needs assistance Equipment used: Rolling walker (2 wheels), None Transfers: Sit to/from Stand Sit to Stand: +2 physical assistance, Mod assist Stand pivot transfers: Max assist, Total assist, +2 safety/equipment         General transfer comment: improved standing and transfer today but remains heavy +2    Ambulation/Gait               General Gait Details: unable   Stairs             Wheelchair Mobility     Tilt Bed Tilt Bed Patient Response: Cooperative  Modified Rankin (Stroke Patients Only)       Balance Overall balance assessment: Needs assistance Sitting-balance support: Feet supported Sitting balance-Leahy Scale: Fair     Standing balance support: Bilateral upper extremity supported Standing balance-Leahy Scale: Poor Standing balance comment: +2 hands on assist but does support her weight more today and assist in transfer                            Cognition Arousal: Alert Behavior During Therapy: [facility] for tasks assessed/performed Overall Cognitive Status: History of cognitive impairments - at baseline  General Comments: mild confusion and wanting to go home today.  aware she broke her hip but does not seem aware of deficits/limitations to mobility        Exercises Other Exercises Other Exercises: to [facility] for large soft BM and to void    General Comments        Pertinent Vitals/Pain Pain Assessment Pain Assessment: Faces Faces Pain Scale: Hurts little more Pain Location: R hip with movement Pain Descriptors / Indicators: Sore, Operative site guarding, Guarding, Grimacing Pain Intervention(s): Limited activity within patient's tolerance, Monitored during  session, Repositioned    Home Living Family/patient expects to be discharged to:: Private residence Living Arrangements: Alone Available Help at Discharge: Family;Personal care attendant;Available 24 hours/day (Personal aides hours are from 9AM-12PM with 2 hour break and 2-9 PM) Type of Home: House Home Access: Stairs to enter Entrance Stairs-Rails: Can reach both;Left;Right Entrance Stairs-Number of Steps: 1 STE front door; 5 steps out back   Home Layout: One level Home Equipment: Tub bench;Grab bars - tub/shower;Hand held shower head;BSC/3in1;Rolling Walker (2 wheels) Additional Comments: Pt's daughter [name] reports Pt has 3 RW placed outside room, at bedside, and in kitchen    Prior Function            PT Goals (current goals can now be found in the care plan section) Progress towards PT goals: Progressing toward goals    Frequency    BID      PT Plan      Co-evaluation   Reason for Co-Treatment: Complexity of the patient's impairments (multi-system involvement) PT goals addressed during session: Mobility/safety with mobility OT goals addressed during session: ADL's and self-care      AM-PAC PT "6 Clicks" Mobility   Outcome Measure  Help needed turning from your back to your side while in a flat bed without using bedrails?: Total Help needed moving from lying on your back to sitting on the side of a flat bed without using bedrails?: Total Help needed moving to and from a bed to a chair (including a wheelchair)?: Total Help needed standing up from a chair using your arms (e.g., wheelchair or bedside chair)?: A Lot Help needed to walk in hospital room?: Total Help needed climbing 3-5 steps with a railing? : Total 6 Click Score: 7    End of Session Equipment Utilized During Treatment: Gait belt Activity Tolerance: Patient tolerated treatment well Patient left: with call bell/phone within reach;with family/visitor present;in bed;with bed alarm set;with  nursing/sitter in room Nurse Communication: Mobility status PT Visit Diagnosis: Unsteadiness on feet (R26.81);Muscle weakness (generalized) (M62.81);Pain Pain - Right/Left: Right Pain - part of body: Hip     Time: 4782-9562 PT Time Calculation (min) (ACUTE ONLY): 11 min  Charges:    $Therapeutic Activity: 8-22 mins PT General Charges $$ ACUTE PT VISIT: 1 Visit                   Danielle Dess, PTA 02/12/23, 12:38 PM

## 2023-02-12 NOTE — Progress Notes (Signed)
Physical Therapy Treatment Patient Details Name: Vanessa [name] MRN: [id number] DOB: [birth year] Today's Date: 02/12/2023   History of Present Illness Pt is a 88 y.o. female who presented to the ED 02/07/23 with family after being found unresponsive outside of her home around 7:15AM. Family reports the Pt was last seen on cameras around 11PM the night before. When she was found this morning, she was at the bottom on her front steps. At the time of EMS arrival, patient was unresponsive with an initial axillary temperature of 84 degrees and only responding to painful stimuli upon presentation. Family denies behavior changes prior to the event. Pt found to have right intertrochanteric fracture of proximal femur also noted to have low blood pressure. R Intramedullary (IM) Nail Introtrochanteric sx done on 02/07/23.    PT Comments  Pt awake and ready for session.  Daughter stated she does have some increased agitation and wanting to go home.  She is able to transition to EOB with max a x 2.  Sitting balance is improved today but continues to require at least +1 for safety.  She is able to stand pivot with max a x 2 to [facility] as she stated she needs to have BM. She is successful and stands with max a x 2 and RW to allow for care and BSC to be switched to recliner.  Remained up in chair after session with daughter.  Will attempt to increase OOB time today and daughter to call when she is ready to return.  OT  in for eval during session and assists with care.   If plan is discharge home, recommend the following: A lot of help with walking and/or transfers;A lot of help with bathing/dressing/bathroom;Direct supervision/assist for medications management;Assist for transportation;Help with stairs or ramp for entrance;Assistance with cooking/housework   Can travel by private vehicle        Equipment Recommendations  None recommended by PT    Recommendations for Other Services       Precautions /  Restrictions Precautions Precautions: Fall Precaution Comments: vitals - RN per MD10/21  not concerned about HR.  movement is more important right now so OOB to chair is done Restrictions Weight Bearing Restrictions: Yes RLE Weight Bearing: Weight bearing as tolerated     Mobility  Bed Mobility Overal bed mobility: Needs Assistance Bed Mobility: Sit to Supine     Supine to sit: Max assist, +2 for physical assistance       Patient Response: Cooperative  Transfers Overall transfer level: Needs assistance Equipment used: Rolling walker (2 wheels), None Transfers: Sit to/from Stand, Bed to chair/wheelchair/BSC Sit to Stand: Max assist, +2 physical assistance Stand pivot transfers: Max assist, Total assist, +2 safety/equipment         General transfer comment: max a for transfer to [facility].  she does stand with RW after BM for care with mod/max a x 2 and does assist with stand.  BSC switched to recliner and she remains up for breakfast.    Ambulation/Gait               General Gait Details: unable   Stairs             Wheelchair Mobility     Tilt Bed Tilt Bed Patient Response: Cooperative  Modified Rankin (Stroke Patients Only)       Balance Overall balance assessment: Needs assistance Sitting-balance support: Feet supported Sitting balance-Leahy Scale: Fair     Standing balance support: Bilateral upper extremity supported  Standing balance-Leahy Scale: Poor Standing balance comment: +2 hands on assist for standing with walker - verbal cues for encouragement                            Cognition Arousal: Alert Behavior During Therapy: WFL for tasks assessed/performed Overall Cognitive Status: Within Functional Limits for tasks assessed                                          Exercises Other Exercises Other Exercises: to [facility] for large soft BM and to void    General Comments        Pertinent Vitals/Pain Pain  Assessment Pain Assessment: Faces Faces Pain Scale: Hurts little more Pain Location: R hip with movement Pain Descriptors / Indicators: Sore, Operative site guarding, Guarding, Grimacing Pain Intervention(s): Limited activity within patient's tolerance, Repositioned    Home Living Family/patient expects to be discharged to:: Private residence Living Arrangements: Alone Available Help at Discharge: Family;Personal care attendant;Available 24 hours/day (Personal aides hours are from 9AM-12PM with 2 hour break and 2-9 PM) Type of Home: House Home Access: Stairs to enter Entrance Stairs-Rails: Can reach both;Left;Right Entrance Stairs-Number of Steps: 1 STE front door; 5 steps out back   Home Layout: One level Home Equipment: Tub bench;Grab bars - tub/shower;Hand held shower head;BSC/3in1;Rolling Walker (2 wheels) Additional Comments: Pt's daughter Vanessa [name] reports Pt has 3 RW placed outside room, at bedside, and in kitchen    Prior Function            PT Goals (current goals can now be found in the care plan section) Progress towards PT goals: Progressing toward goals    Frequency    BID      PT Plan      Co-evaluation   Reason for Co-Treatment: Complexity of the patient's impairments (multi-system involvement) PT goals addressed during session: Mobility/safety with mobility OT goals addressed during session: ADL's and self-care      AM-PAC PT "6 Clicks" Mobility   Outcome Measure  Help needed turning from your back to your side while in a flat bed without using bedrails?: Total Help needed moving from lying on your back to sitting on the side of a flat bed without using bedrails?: Total Help needed moving to and from a bed to a chair (including a wheelchair)?: Total Help needed standing up from a chair using your arms (e.g., wheelchair or bedside chair)?: A Lot Help needed to walk in hospital room?: Total Help needed climbing 3-5 steps with a railing? : Total 6 Click  Score: 7    End of Session Equipment Utilized During Treatment: Gait belt Activity Tolerance: Patient tolerated treatment well Patient left: in chair;with chair alarm set;with call bell/phone within reach;with family/visitor present Nurse Communication: Mobility status PT Visit Diagnosis: Unsteadiness on feet (R26.81);Muscle weakness (generalized) (M62.81);Pain Pain - Right/Left: Right Pain - part of body: Hip     Time: 0981-1914 PT Time Calculation (min) (ACUTE ONLY): 23 min  Charges:    $Therapeutic Activity: 23-37 mins PT General Charges $$ ACUTE PT VISIT: 1 Visit                   Danielle Dess, PTA 02/12/23, 10:48 AM

## 2023-02-13 DIAGNOSIS — S72141A Displaced intertrochanteric fracture of right femur, initial encounter for closed fracture: Secondary | ICD-10-CM | POA: Diagnosis not present

## 2023-02-13 DIAGNOSIS — R6521 Severe sepsis with septic shock: Secondary | ICD-10-CM | POA: Diagnosis not present

## 2023-02-13 DIAGNOSIS — Z515 Encounter for palliative care: Secondary | ICD-10-CM | POA: Diagnosis not present

## 2023-02-13 DIAGNOSIS — A419 Sepsis, unspecified organism: Secondary | ICD-10-CM | POA: Diagnosis not present

## 2023-02-13 LAB — CBC
HCT: 31.5 % — ABNORMAL LOW (ref 36.0–46.0)
Hemoglobin: 10.2 g/dL — ABNORMAL LOW (ref 12.0–15.0)
MCH: 29.7 pg (ref 26.0–34.0)
MCHC: 32.4 g/dL (ref 30.0–36.0)
MCV: 91.8 fL (ref 80.0–100.0)
Platelets: 219 10*3/uL (ref 150–400)
RBC: 3.43 MIL/uL — ABNORMAL LOW (ref 3.87–5.11)
RDW: 16.3 % — ABNORMAL HIGH (ref 11.5–15.5)
WBC: 12.4 10*3/uL — ABNORMAL HIGH (ref 4.0–10.5)
nRBC: 0.5 % — ABNORMAL HIGH (ref 0.0–0.2)

## 2023-02-13 LAB — BASIC METABOLIC PANEL
Anion gap: 11 (ref 5–15)
BUN: 16 mg/dL (ref 8–23)
CO2: 26 mmol/L (ref 22–32)
Calcium: 8.8 mg/dL — ABNORMAL LOW (ref 8.9–10.3)
Chloride: 100 mmol/L (ref 98–111)
Creatinine, Ser: 0.71 mg/dL (ref 0.44–1.00)
GFR, Estimated: >60 mL/min (ref >60)
Glucose, Bld: 162 mg/dL — ABNORMAL HIGH (ref 70–99)
Potassium: 3.8 mmol/L (ref 3.5–5.1)
Sodium: 137 mmol/L (ref 135–145)

## 2023-02-13 LAB — GLUCOSE, CAPILLARY
Glucose-Capillary: 136 mg/dL — ABNORMAL HIGH (ref 70–99)
Glucose-Capillary: 167 mg/dL — ABNORMAL HIGH (ref 70–99)
Glucose-Capillary: 177 mg/dL — ABNORMAL HIGH (ref 70–99)
Glucose-Capillary: 152 mg/dL — ABNORMAL HIGH (ref 70–99)

## 2023-02-13 MED ORDER — ENOXAPARIN SODIUM 40 MG/0.4ML IJ SOSY
40.0000 mg | PREFILLED_SYRINGE | Freq: Every day | INTRAMUSCULAR | Status: DC
  Administered 2023-02-13 – 2023-02-14 (×2): 40 mg via SUBCUTANEOUS
  Filled 2023-02-13 (×2): qty 0.4

## 2023-02-13 NOTE — TOC Progression Note (Signed)
Transition of Care [facility]) - Progression Note    Patient Details  Name: Vanessa [name] MRN: [id number] Date of Birth: 19-Oct-1934  [facility] CM/SW Contact  Truddie Hidden, RN Phone Number: 02/13/2023, 1:52 PM  Clinical Narrative:    Spoke with patient's daughter regarding bed offers. Bed offers given for [facility] and [facility]. Patient's daughter would really like [facility]. She is requesting time to think about current offers and will have a answer by tomorrow.   Sue Lush from [facility] called regarding bed offer. She will review referral again.          Expected Discharge Plan and Services                                               Social Determinants of Health (SDOH) Interventions SDOH Screenings   Food Insecurity: No Food Insecurity (02/08/2023)  Housing: Low Risk  (02/08/2023)  Transportation Needs: No Transportation Needs (02/08/2023)  Utilities: Not At Risk (02/08/2023)  Alcohol Screen: Low Risk  (06/13/2022)  Depression (PHQ2-9): Low Risk  (08/10/2022)  Financial Resource Strain: Low Risk  (08/10/2022)  Physical Activity: Inactive (08/10/2022)  Social Connections: Unknown (08/10/2022)  Recent Concern: Social Connections - Moderately Isolated (06/13/2022)  Stress: No Stress Concern Present (08/10/2022)  Tobacco Use: Medium Risk (02/07/2023)    Readmission Risk Interventions     No data to display

## 2023-02-13 NOTE — Progress Notes (Signed)
Physical Therapy Treatment Patient Details Name: Vanessa [name] MRN: [id number] DOB: [birth year] Today's Date: 02/13/2023   History of Present Illness Pt is a 88 y.o. female who presented to the ED 02/07/23 with family after being found unresponsive outside of her home around 7:15AM. Family reports the Pt was last seen on cameras around 11PM the night before. When she was found this morning, she was at the bottom on her front steps. At the time of EMS arrival, patient was unresponsive with an initial axillary temperature of 84 degrees and only responding to painful stimuli upon presentation. Family denies behavior changes prior to the event. Pt found to have right intertrochanteric fracture of proximal femur also noted to have low blood pressure. R Intramedullary (IM) Nail Introtrochanteric sx done on 02/07/23.    PT Comments  Pt was long sitting in bed, alert, however pleasantly confused. She agrees to session with encouragement. Supportive family present throughout and able to state that pt's cognition is far from her baseline. " She has been starting to have some STM concerns but not to this extent. Pt does have severe pain with any/all RLE movements. Max assist to achieve EOB sitting. Pt unaware of BM. RN staff assisted with hygiene care while author assisted pt with standing. She tolerated standing x ~ 2 minutes prior to sitting. Attempted taking steps at EOB but pt unable. Stood pivot from elevated bed height to recliner with max assist of one. Reviewed positioning and expectations going forward with pt and family. DC recs remain appropriate.    If plan is discharge home, recommend the following: Direct supervision/assist for medications management;Assist for transportation;Help with stairs or ramp for entrance;Assistance with cooking/housework;Two people to help with walking and/or transfers;Two people to help with bathing/dressing/bathroom     Equipment Recommendations  Other (comment) (Defer  to next level of care)       Precautions / Restrictions Precautions Precautions: Fall Precaution Comments: HR quickly elevates with activity. Cardiology aware. MD cleared fro OOB activity even with elevated HR Restrictions Weight Bearing Restrictions: Yes RLE Weight Bearing: Weight bearing as tolerated     Mobility  Bed Mobility Overal bed mobility: Needs Assistance Bed Mobility: Supine to Sit  Supine to sit: Max assist, Used rails, HOB elevated  General bed mobility comments: increased time + max assist to achieve EOB sitting    Transfers Overall transfer level: Needs assistance Equipment used: Rolling walker (2 wheels), None Transfers: Sit to/from Stand Sit to Stand: Max assist Stand pivot transfers: Max assist, From elevated surface  General transfer comment: 2nd person for hygiene care after BM. Chartered loss adjuster assisted with standing while RN staff perform hygiene care    Ambulation/Gait  General Gait Details: unable   Balance Overall balance assessment: Needs assistance Sitting-balance support: Feet supported Sitting balance-Leahy Scale: Fair  Standing balance support: Bilateral upper extremity supported Standing balance-Leahy Scale: Poor    Cognition Arousal: Alert Behavior During Therapy: WFL for tasks assessed/performed Overall Cognitive Status: History of cognitive impairments - at baseline  General Comments: Pt is alert but remains far form baseline cognition. Agreeable and cooperative but severely limited by pain           General Comments General comments (skin integrity, edema, etc.): Reviewed ther ex with pt and family to promote strengthening and return in function      Pertinent Vitals/Pain Pain Assessment Pain Assessment: PAINAD Faces Pain Scale: Hurts whole lot Breathing: occasional labored breathing, short period of hyperventilation Negative Vocalization: occasional moan/groan, low speech, negative/disapproving  quality Facial Expression: sad,  frightened, frown Body Language: tense, distressed pacing, fidgeting Consolability: distracted or reassured by voice/touch PAINAD Score: 5 Pain Location: R hip with movement Pain Descriptors / Indicators: Sore, Operative site guarding, Guarding, Grimacing Pain Intervention(s): Limited activity within patient's tolerance, Monitored during session, Premedicated before session, Patient requesting pain meds-RN notified, Repositioned     PT Goals (current goals can now be found in the care plan section) Acute Rehab PT Goals Patient Stated Goal: to go home Progress towards PT goals: Progressing toward goals    Frequency    BID           Co-evaluation     PT goals addressed during session: Mobility/safety with mobility;Balance;Proper use of DME;Strengthening/ROM        AM-PAC PT "6 Clicks" Mobility   Outcome Measure  Help needed turning from your back to your side while in a flat bed without using bedrails?: A Lot Help needed moving from lying on your back to sitting on the side of a flat bed without using bedrails?: A Lot Help needed moving to and from a bed to a chair (including a wheelchair)?: Total Help needed standing up from a chair using your arms (e.g., wheelchair or bedside chair)?: Total Help needed to walk in hospital room?: Total Help needed climbing 3-5 steps with a railing? : Total 6 Click Score: 8    End of Session   Activity Tolerance: Patient limited by pain Patient left: in chair;with call bell/phone within reach;with chair alarm set;with family/visitor present Nurse Communication: Mobility status PT Visit Diagnosis: Unsteadiness on feet (R26.81);Muscle weakness (generalized) (M62.81);Pain Pain - Right/Left: Right Pain - part of body: Hip     Time: 0850-0919 PT Time Calculation (min) (ACUTE ONLY): 29 min  Charges:    $Therapeutic Activity: 23-37 mins PT General Charges $$ ACUTE PT VISIT: 1 Visit                    Jetta Lout  PTA 02/13/23, 4:14 PM

## 2023-02-13 NOTE — Plan of Care (Signed)
  Problem: Health Behavior/Discharge Planning: Goal: Ability to manage health-related needs will improve Outcome: Progressing   Problem: Clinical Measurements: Goal: Will remain free from infection Outcome: Progressing Goal: Diagnostic test results will improve Outcome: Progressing Goal: Respiratory complications will improve Outcome: Progressing Goal: Cardiovascular complication will be avoided Outcome: Progressing   Problem: Nutrition: Goal: Adequate nutrition will be maintained Outcome: Progressing

## 2023-02-13 NOTE — Progress Notes (Signed)
  Subjective: 6 Days Post-Op Procedure(s) (LRB): INTRAMEDULLARY (IM) NAIL INTERTROCHANTERIC (Right) Patient reports pain as mild. Continued drainage right hip Patient with no complaints.  Family at bedside  Objective: Vital signs in last 24 hours: Temp:  [98 F (36.7 C)-99.1 F (37.3 C)] 99.1 F (37.3 C) (10/23 0720) Pulse Rate:  [47-101] 62 (10/23 0720) Resp:  [13-20] 18 (10/23 0720) BP: (90-130)/(51-85) 102/60 (10/23 0720) SpO2:  [91 %-100 %] 99 % (10/23 0720) Weight:  [61 kg] 61 kg (10/23 0357)  Intake/Output from previous day: 10/22 0701 - 10/23 0700 In: 536.9 [P.O.:437; IV Piggyback:99.9] Out: 450 [Urine:450] Intake/Output this shift: No intake/output data recorded.  Recent Labs    02/11/23 0352 02/12/23 0349 02/13/23 0348  HGB 8.3* 9.3* 10.2*   Recent Labs    02/12/23 0349 02/13/23 0348  WBC 8.4 12.4*  RBC 3.05* 3.43*  HCT 28.5* 31.5*  PLT 185 219   Recent Labs    02/11/23 0352 02/12/23 0349  NA 140 138  K 3.4* 3.8  CL 104 102  CO2 27 27  BUN 18 16  CREATININE 0.60 0.56  GLUCOSE 86 99  CALCIUM 8.0* 8.2*   No results for input(s): "LABPT", "INR" in the last 72 hours.  EXAM General - Patient is Alert, Appropriate, and Oriented  Extremity - Neurologically intact Neurovascular intact Sensation intact distally Intact pulses distally Dorsiflexion/Plantar flexion intact No cellulitis present Compartment soft Dressing - clear yellow discharege right hip, moderate Motor Function - intact, moving foot and toes well on exam.   Past Medical History:  Diagnosis Date   Anemia    treated ~2009 with iron, resolved   Arthritis    Cataract    surgery on R catarct 2012   Chronic systolic CHF (congestive heart failure) (HCC)    a. TTE 2012: EF of 45-50%, mild diffuse HK, trivial AI, mild MR, mildly dilated RV with mild reduction of RVSF, mild to moderate TR, mild to moderate PR, mildly elevated PASP   COPD (chronic obstructive pulmonary disease) (HCC)     Family history of adverse reaction to anesthesia    Mother - altered mental status (long term)   Glaucoma    History of pelvic fracture    Hyperglycemia    mild inc in fasting sugar   Hyperlipidemia    Hypothyroidism    Insomnia    Migraines    without aura, sx since age 46   Osteopenia    prev with 10 years of fosamax and intolerant of evista   PAF (paroxysmal atrial fibrillation) (HCC)    a. initially noted 2012; b. CHADS2VASc at least 5 (CHF, age x 2, vasacular disease, female)   Pulmonary hypertension (HCC)    Wears dentures    full upper and lower    Assessment/Plan:   6 Days Post-Op Procedure(s) (LRB): INTRAMEDULLARY (IM) NAIL INTERTROCHANTERIC (Right) Principal Problem:   Septic shock (HCC)  Estimated body mass index is 23.08 kg/m as calculated from the following:   Height as of this encounter: 5\' 4"  (1.626 m).   Weight as of this encounter: 61 kg. Advance diet Continue with PT, WBAT RLE Pain well controlled VSS Moderate drainage right hip. No signs of infection. Will order provena for right hip to assist with wound healing.     Lollie Marrow, PA-C [facility] 02/13/2023, 7:46 AM

## 2023-02-13 NOTE — Plan of Care (Signed)
  Problem: Clinical Measurements: Goal: Ability to maintain clinical measurements within normal limits will improve Outcome: Progressing   Problem: Clinical Measurements: Goal: Respiratory complications will improve Outcome: Progressing   Problem: Clinical Measurements: Goal: Cardiovascular complication will be avoided Outcome: Progressing   Problem: Pain Managment: Goal: General experience of comfort will improve Outcome: Progressing   Problem: Safety: Goal: Ability to remain free from injury will improve Outcome: Progressing   Problem: Skin Integrity: Goal: Risk for impaired skin integrity will decrease Outcome: Progressing   

## 2023-02-13 NOTE — Plan of Care (Signed)
  Problem: Education: Goal: Knowledge of General Education information will improve Description: Including pain rating scale, medication(s)/side effects and non-pharmacologic comfort measures Outcome: Progressing   Problem: Health Behavior/Discharge Planning: Goal: Ability to manage health-related needs will improve Outcome: Progressing   Problem: Clinical Measurements: Goal: Ability to maintain clinical measurements within normal limits will improve Outcome: Progressing Goal: Will remain free from infection Outcome: Progressing Goal: Diagnostic test results will improve Outcome: Progressing Goal: Respiratory complications will improve Outcome: Progressing Goal: Cardiovascular complication will be avoided Outcome: Progressing   Problem: Activity: Goal: Risk for activity intolerance will decrease Outcome: Progressing   Problem: Nutrition: Goal: Adequate nutrition will be maintained Outcome: Progressing   Problem: Coping: Goal: Level of anxiety will decrease Outcome: Progressing   Problem: Elimination: Goal: Will not experience complications related to bowel motility Outcome: Progressing Goal: Will not experience complications related to urinary retention Outcome: Progressing   Problem: Pain Managment: Goal: General experience of comfort will improve Outcome: Progressing   Problem: Safety: Goal: Ability to remain free from injury will improve Outcome: Progressing   Problem: Skin Integrity: Goal: Risk for impaired skin integrity will decrease Outcome: Progressing   Problem: Education: Goal: Ability to describe self-care measures that may prevent or decrease complications (Diabetes Survival Skills Education) will improve Outcome: Progressing   Problem: Coping: Goal: Ability to adjust to condition or change in health will improve Outcome: Progressing   Problem: Fluid Volume: Goal: Ability to maintain a balanced intake and output will improve Outcome:  Progressing   Problem: Health Behavior/Discharge Planning: Goal: Ability to identify and utilize available resources and services will improve Outcome: Progressing Goal: Ability to manage health-related needs will improve Outcome: Progressing   Problem: Metabolic: Goal: Ability to maintain appropriate glucose levels will improve Outcome: Progressing   Problem: Nutritional: Goal: Maintenance of adequate nutrition will improve Outcome: Progressing Goal: Progress toward achieving an optimal weight will improve Outcome: Progressing   Problem: Skin Integrity: Goal: Risk for impaired skin integrity will decrease Outcome: Progressing   Problem: Tissue Perfusion: Goal: Adequacy of tissue perfusion will improve Outcome: Progressing   

## 2023-02-13 NOTE — Progress Notes (Signed)
Physical Therapy Treatment Patient Details Name: Vanessa [name] MRN: [id number] DOB: 01-22-1935 Today's Date: 02/13/2023   History of Present Illness Pt is a 88 y.o. female who presented to the ED 02/07/23 with family after being found unresponsive outside of her home around 7:15AM. Family reports the Pt was last seen on cameras around 11PM the night before. When she was found this morning, she was at the bottom on her front steps. At the time of EMS arrival, patient was unresponsive with an initial axillary temperature of 84 degrees and only responding to painful stimuli upon presentation. Family denies behavior changes prior to the event. Pt found to have right intertrochanteric fracture of proximal femur also noted to have low blood pressure. R Intramedullary (IM) Nail Introtrochanteric sx done on 02/07/23.    PT Comments  Author was contacted by pt's family with request to assist pt with getting back to bed. Pt had another loose BM. +2 assistance required. Max assist of one to stand while 2nd person provided hygiene care. Pt stood 2 x with 2nd STS+ pivot to EOB. Still unable to advance to clearing LEs to taking steps. Fear and pain limiting. Pt's HR elevated into 140s but quickly resolves with seated rest  back in bed. Pt continues to be limited by pain and fatigue. She requires +2 assistance for safety and will benefit from continued skilled PT going forward to maximize her independence while decreasing caregiver burden. Acute PT will continue current POC.     If plan is discharge home, recommend the following: Direct supervision/assist for medications management;Assist for transportation;Help with stairs or ramp for entrance;Assistance with cooking/housework;Two people to help with walking and/or transfers;Two people to help with bathing/dressing/bathroom     Equipment Recommendations  Other (comment) (Defer to next level of care)       Precautions / Restrictions Precautions Precautions:  Fall Precaution Comments: HR quickly elevates with activity. Cardiology aware. MD cleared fro OOB activity even with elevated HR Restrictions Weight Bearing Restrictions: Yes RLE Weight Bearing: Weight bearing as tolerated     Mobility  Bed Mobility Overal bed mobility: Needs Assistance Bed Mobility: Sit to Supine  Supine to sit: Max assist  General bed mobility comments: vcs for step by step sequencing. pain remains most limiting    Transfers Overall transfer level: Needs assistance Equipment used: Rolling walker (2 wheels), None Transfers: Sit to/from Stand Sit to Stand: Max assist Stand pivot transfers: Max assist, +2 safety/equipment  General transfer comment: pt stood pivot from recliner to EOB. prior to stand pivot, pt had another BM and required +2 assistance to perform hygiene care. Max assist to stand + 2nd person for hygiene care. Pt remains unable to take steps even with assistance with lateral wt shift and max vcs/assist    Ambulation/Gait  General Gait Details: currently unable due to pain     Balance Overall balance assessment: Needs assistance Sitting-balance support: Feet supported Sitting balance-Leahy Scale: Fair     Standing balance support: Bilateral upper extremity supported Standing balance-Leahy Scale: Poor       Cognition Arousal: Alert Behavior During Therapy: WFL for tasks assessed/performed Overall Cognitive Status: History of cognitive impairments - at baseline    General Comments: Pt is alert but remains far form baseline cognition. Agreeable and cooperative but severely limited by pain           General Comments General comments (skin integrity, edema, etc.): pt was able to maintain sao2 > 92% on rm air. per family, pt  has had alot of loose BMs since last two days. Reviewed importance of safety and performing ther ex in/out of bed. Family very supportive and will be assisting pt 24/7. SNF at DC safest DC disposition.      Pertinent  Vitals/Pain Pain Assessment Pain Assessment: PAINAD Faces Pain Scale: Hurts whole lot Breathing: occasional labored breathing, short period of hyperventilation Negative Vocalization: occasional moan/groan, low speech, negative/disapproving quality Facial Expression: sad, frightened, frown Body Language: tense, distressed pacing, fidgeting Consolability: distracted or reassured by voice/touch PAINAD Score: 5 Pain Location: R hip with movement Pain Descriptors / Indicators: Sore, Operative site guarding, Guarding, Grimacing Pain Intervention(s): Limited activity within patient's tolerance, Monitored during session, Premedicated before session, Repositioned, Ice applied     PT Goals (current goals can now be found in the care plan section) Acute Rehab PT Goals Patient Stated Goal: go home Progress towards PT goals: Progressing toward goals    Frequency    BID       Co-evaluation     PT goals addressed during session: Mobility/safety with mobility;Balance;Proper use of DME;Strengthening/ROM        AM-PAC PT "6 Clicks" Mobility   Outcome Measure  Help needed turning from your back to your side while in a flat bed without using bedrails?: A Lot Help needed moving from lying on your back to sitting on the side of a flat bed without using bedrails?: A Lot Help needed moving to and from a bed to a chair (including a wheelchair)?: Total Help needed standing up from a chair using your arms (e.g., wheelchair or bedside chair)?: Total Help needed to walk in hospital room?: Total Help needed climbing 3-5 steps with a railing? : Total 6 Click Score: 8    End of Session   Activity Tolerance: Patient limited by pain;Patient limited by fatigue Patient left: in bed;with call bell/phone within reach;with bed alarm set;with family/visitor present;with nursing/sitter in room Nurse Communication: Mobility status PT Visit Diagnosis: Unsteadiness on feet (R26.81);Muscle weakness  (generalized) (M62.81);Pain Pain - Right/Left: Right Pain - part of body: Hip     Time: 1110-1136 PT Time Calculation (min) (ACUTE ONLY): 26 min  Charges:    $Therapeutic Activity: 23-37 mins PT General Charges $$ ACUTE PT VISIT: 1 Visit                    Jetta Lout PTA 02/13/23, 4:38 PM

## 2023-02-13 NOTE — Progress Notes (Signed)
Progress Note   Patient: Vanessa [name] ZOX:[id number] DOB: 05-Jan-1935 DOA: 02/07/2023     6 DOS: the patient was seen and examined on 02/13/2023   Brief hospital course:  88 yo female who presented to the ED on 02/07/2023 with family after being found unresponsive outside of her home early that morning. Family reports the patient was last seen on cameras around 11PM the previous night. She was found at the bottom on her front steps. At the time of EMS arrival, patient was unresponsive with an initial axillary temperature of 84 degrees and only responding to painful stimuli upon presentation. Family denies behavior changes prior to the event.      In the ED, pt was found to have right intertrochanteric fracture of proximal femur and hypotension and lactic acidosis (LA 7.4>>6.7>>5), mild AKI, septic vs hypovolemic shock, metabolic acidosis, possible aspiration pneumonia.  She was given 2 L IVF's, Bair hugger for hypothermia, IV antibiotics, started on Levophed and IV antibiotics.  Admitted to ICU.    Orthopedic surgery was consulted and pt underwent ORIF with nail on evening of 10/17 with Dr. Joice Lofts.   10/18 - low BP's but off pressors. Family reported hx of baseline lower BP's as well.  Palliative care consulted.   10/19 - [facility] assumed care. Called to bedside early AM due to clinical deterioration with ongoing hypotension MAP's adequate.  Pt minimally responsive but would awaken, follow commands, no focal neurologic deficit.  Discussed guarded to poor prognosis with family at bedside, and potential options for aggressive management versus comfort approach.    Palliative met with family again as well.  Seen by Cardiology and family elected not to pursue advanced HF management, leaning towards comfort care.  Repeat Hbg 6.7, transfused 1 unit pRBC's.  Resumed on antibiotics for aspiration PNA.  Plan is 'watchful waiting' over coming hours to days.   10/20 - pt doing better, Hbg improved w transfusion,  8.4 this AM.  BP's improved, remain soft but stable MAP's consistently.  Pt awake, alert, interactive, denies complaints.   10/23-sitting up in a recliner.  Conversant.  Family at bedside     Assessment and Plan:     Septic Shock - resolved.  Due to aspiration PNA. Suspect also hypovolemic component. Presented with hypotension requiring pressor and initial lactic acid 7.4.   Lactate normalized 10/19 to 1.1 Sepsis physiology resolved. Monitor fever curve, CBC, hemodynamics Maintain MAP > 60-65 Continue midodrine, wean as BP tolerates     Aspiration Pneumonia  in setting of fall and prolonged down time, underlying dementia but family deny signs of dysphagia.  Repeat CXR 10/19 - R base consolidation and small pleural effusion.  Procal 6.28 (up from 1.26) --IV Unasyn >> PO Augmentin to complete 5 day course    Right Intertrochanteric Femur Fracture  10/22 -- oozing from site reported, daughter stated ortho might place wound vac --Surgical repair 10/17 by Dr. Joice Lofts  --Control pain as needed --PT consult - SNF recommended & TOC working on placement --DVT ppx on hold due to anemia needing transfusion  --SCD's --Wound VAC ordered for drainage from right wound incision   Acute kidney injury - resolved Cr 1.19 >> 0.83 normalized --Monitor BMP   Hypokalemia - K 3.4 - replaced --Monitor BMP   Acute on Chronic HFpEF BNP 954 this AM, no overt pulmonary edema on CXR, small R pleural effusion Net IO Since Admission: 4,252.02 mL [02/12/23 1615] --Cardiology following --Resumed on home PO Lasix --BNP now improved 954 >>  318 --Daily weights & strict IO's   History of Paroxysmal A-fib, SVT --Cardiology following - see recs --Telemetry --IV metoprolol push for HR sustain > 135 --Metoprolol for HR control as BP tolerates    Acute metabolic encephalopathy - resolved Due to hypothermia, septic shock Mentation had reportedly returned to baseline prior to clinical worsening today  (10/19) --Continue to manage underlying issues as outlined --Address pain with least sedating meds possible  --Delirium precautions --Address bowel and bladder   Hypokalemia - resolved Monitor Bmp, Mg  Replace electrolytes PRN    Acute Blood Loss Anemia 10/19 Hbg 6.7 this AM (from 10 >> ... 7.8 >> 7.3), transfused 1 unit RBC's >> 9.2 Hbg repeat 10/22 Hbg improved 9.3 No significant bleeding from surgical site reported DVT prophylaxis on hold, SCD's in place Serial Hbg's to monitor Transfuse for Hbg < 7   Type 2 diabetes - Hbg A1c 7.7%, poorly controlled --Sliding scale Novolog -- reduce from moderate 0-15 units to sensitive 0-9 units given CBG's 78, 83 --Hypoglycemia protocol  --Appears not on meds at home --PCP follow up   COPD - stable, not exacerbated --monitor for hypoxia --Duonebs PRN   GERD -  --resume PPI                 Subjective: Sitting up in a recliner.  Oriented only to person, not to place or time.  Family at the bedside  Physical Exam: Vitals:   02/13/23 0357 02/13/23 0720 02/13/23 0953 02/13/23 1210  BP:  102/60 (!) 111/57 (!) 97/55  Pulse:  62  94  Resp:  18 (!) 24 16  Temp:  99.1 F (37.3 C)  98.2 F (36.8 C)  TempSrc:  Oral  Oral  SpO2:  99%  93%  Weight: 61 kg     Height:       General exam: awake, alert, no acute distress HEENT: moist mucus membranes, hearing grossly normal  Respiratory system: CTAB, no wheezes, rales or rhonchi, normal respiratory effort. Cardiovascular system: normal S1/S2, RRR, no JVD, murmurs, rubs, gallops, no pedal edema.   Gastrointestinal system: soft, NT, ND Central nervous system: A&O x1. no gross focal neurologic deficits, normal speech Extremities: left lateral hip wound oozing  Skin: dry, intact, normal temperature, normal color Psychiatry: normal mood, congruent affect       Data Reviewed: Labs reviewed. There are no new results to review at this time.  Family Communication: Plan of care  discussed with patient's children at the bedside.  They verbalized understanding and agree with the plan  Disposition: Status is: Inpatient Remains inpatient appropriate because: Discharge planning  Planned Discharge Destination: Skilled nursing facility    Time spent:33 minutes  Author: Lucile Shutters, MD 02/13/2023 2:19 PM  For on call review [email or web address].

## 2023-02-13 NOTE — Progress Notes (Signed)
Palliative Care Progress Note, Assessment & Plan   Patient Name: Vanessa [name]       Date: 02/13/2023 DOB: 1934/10/15  Age: 88 y.o. MRN#: [id number] Attending Physician: Lucile Shutters, MD Primary Care Physician: Joaquim Nam, MD Admit Date: 02/07/2023  Subjective: Patient is lying in bed, asleep, in no apparent distress.  Respirations are even and unlabored.  Patient's caregiver is at bedside.  No family present during my visit.  HPI: 88 y.o. female  with past medical history of chronic combined systolic and diastolic HF, A-fib, COPD, pulmonary emphysema, type 2 diabetes, hypothyroidism, anemia, and depression admitted on 02/07/2023 with hypothermia after being found down outside of her home.    Patient found to have right intertrochanteric fracture of proximal femur.  10/17, patient underwent right hip IM nail placement.   Patient is hypotensive and tachycardic.   PMT was consulted to discuss goals of care.  Summary of counseling/coordination of care: Extensive chart review completed prior to meeting patient including labs, vital signs, imaging, progress notes, orders, and available advanced directive documents from current and previous encounters.   After reviewing the patient's chart and assessing the patient at bedside, I spoke with caregiver at bedside.  In light of patient's plan to be discharged to a rehab facility, I introduced concept of MOST form.  I highlighted the importance of making patient is when she is known in advance of an emergency or urgent situation.  Patient has been very clear with her goals.  A MOST form is just reiterating those for when she transfers out of this medical facility.  Questions and concerns were addressed.  I left copy of MOST at bedside for patient/family  to review.  I will follow-up with patient and family tomorrow.  PMT will continue to follow and support patient throughout her hospitalization.  Physical Exam Vitals reviewed.  Constitutional:      General: She is not in acute distress.    Appearance: She is normal weight.  HENT:     Head: Normocephalic.  Pulmonary:     Effort: Pulmonary effort is normal.  Abdominal:     Palpations: Abdomen is soft.  Musculoskeletal:     Comments: Generalized weakness  Skin:    General: Skin is warm and dry.     Comments: UTA surgical site  Psychiatric:        Mood and Affect: Mood normal.        Behavior: Behavior normal.             Total Time 25 minutes   Time spent includes: Detailed review of medical records (labs, imaging, vital signs), medically appropriate exam (mental status, respiratory, cardiac, skin), discussed with treatment team, counseling and educating patient, family and staff, documenting clinical information, medication management and coordination of care.  Samara Deist L. Bonita Quin, DNP, FNP-BC Palliative Medicine Team

## 2023-02-14 DIAGNOSIS — R6521 Severe sepsis with septic shock: Secondary | ICD-10-CM | POA: Diagnosis not present

## 2023-02-14 DIAGNOSIS — S72141A Displaced intertrochanteric fracture of right femur, initial encounter for closed fracture: Secondary | ICD-10-CM | POA: Diagnosis not present

## 2023-02-14 DIAGNOSIS — A419 Sepsis, unspecified organism: Secondary | ICD-10-CM | POA: Diagnosis not present

## 2023-02-14 DIAGNOSIS — Z515 Encounter for palliative care: Secondary | ICD-10-CM | POA: Diagnosis not present

## 2023-02-14 LAB — CBC
HCT: 32.3 % — ABNORMAL LOW (ref 36.0–46.0)
Hemoglobin: 10.7 g/dL — ABNORMAL LOW (ref 12.0–15.0)
MCH: 30.7 pg (ref 26.0–34.0)
MCHC: 33.1 g/dL (ref 30.0–36.0)
MCV: 92.6 fL (ref 80.0–100.0)
Platelets: 282 10*3/uL (ref 150–400)
RBC: 3.49 MIL/uL — ABNORMAL LOW (ref 3.87–5.11)
RDW: 17.2 % — ABNORMAL HIGH (ref 11.5–15.5)
WBC: 14.3 10*3/uL — ABNORMAL HIGH (ref 4.0–10.5)
nRBC: 0.8 % — ABNORMAL HIGH (ref 0.0–0.2)

## 2023-02-14 LAB — GLUCOSE, CAPILLARY
Glucose-Capillary: 173 mg/dL — ABNORMAL HIGH (ref 70–99)
Glucose-Capillary: 130 mg/dL — ABNORMAL HIGH (ref 70–99)
Glucose-Capillary: 153 mg/dL — ABNORMAL HIGH (ref 70–99)
Glucose-Capillary: 164 mg/dL — ABNORMAL HIGH (ref 70–99)

## 2023-02-14 MED ORDER — TRAMADOL HCL 50 MG PO TABS
50.0000 mg | ORAL_TABLET | Freq: Four times a day (QID) | ORAL | Status: DC | PRN
  Administered 2023-02-14 – 2023-02-15 (×2): 50 mg via ORAL
  Filled 2023-02-14 (×3): qty 1

## 2023-02-14 MED ORDER — ACETAMINOPHEN 325 MG PO TABS: 325.0000 mg | ORAL_TABLET | Freq: Four times a day (QID) | ORAL | Status: DC | PRN

## 2023-02-14 MED ORDER — ENOXAPARIN SODIUM 40 MG/0.4ML IJ SOSY: 40.0000 mg | PREFILLED_SYRINGE | Freq: Every day | INTRAMUSCULAR | Status: DC

## 2023-02-14 NOTE — TOC Progression Note (Addendum)
Transition of Care [facility]) - Progression Note    Patient Details  Name: Vanessa [name] MRN: [id number] Date of Birth: 01-Jul-1934  [facility] of [facility] CM/SW Contact  Truddie Hidden, RN Phone Number: 02/14/2023, 9:57 AM  Clinical Narrative:    Spoke with patient's daughter to give bed offer for [facility]. Bed offer accepted. Spoke with Tammy from [facility]. Auth started.   1:53pm Retrieved call from Tammy at [facility]. Patient Berkley Harvey approved for 7 days  SNF#[id number] EMS# [id number]  No more [facility] trucks are available for non emergency transport.         Expected Discharge Plan and Services                                               Social Determinants of Health (SDOH) Interventions SDOH Screenings   Food Insecurity: No Food Insecurity (02/08/2023)  Housing: Low Risk  (02/08/2023)  Transportation Needs: No Transportation Needs (02/08/2023)  Utilities: Not At Risk (02/08/2023)  Alcohol Screen: Low Risk  (06/13/2022)  Depression (PHQ2-9): Low Risk  (08/10/2022)  Financial Resource Strain: Low Risk  (08/10/2022)  Physical Activity: Inactive (08/10/2022)  Social Connections: Unknown (08/10/2022)  Recent Concern: Social Connections - Moderately Isolated (06/13/2022)  Stress: No Stress Concern Present (08/10/2022)  Tobacco Use: Medium Risk (02/07/2023)    Readmission Risk Interventions     No data to display

## 2023-02-14 NOTE — Progress Notes (Signed)
Palliative Care Progress Note, Assessment & Plan   Patient Name: Vanessa [name]       Date: 02/14/2023 DOB: March 04, 1935  Age: 88 y.o. MRN#: [id number] Attending Physician: Lucile Shutters, MD Primary Care Physician: Joaquim Nam, MD Admit Date: 02/07/2023  Subjective: Patient is lying in bed in no apparent distress.  She is awake, alert, and acknowledges my presence.  She denies pain or discomfort at this time.  Her daughter is at bedside.  HPI: 88 y.o. female  with past medical history of chronic combined systolic and diastolic HF, A-fib, COPD, pulmonary emphysema, type 2 diabetes, hypothyroidism, anemia, and depression admitted on 02/07/2023 with hypothermia after being found down outside of her home.    Patient found to have right intertrochanteric fracture of proximal femur.  10/17, patient underwent right hip IM nail placement.   PMT was consulted to discuss goals of care.  Summary of counseling/coordination of care: Extensive chart review completed prior to meeting patient including labs, vital signs, imaging, progress notes, orders, and available advanced directive documents from current and previous encounters.   After reviewing the patient's chart and assessing the patient at bedside, spoke with patient and her daughter at bedside in regards to boundaries and goals of care.  Plan remains for patient to be transferred to [facility] for rehab.  In light of patient transferring to an outdoor medical facility, I discussed and completed a MOST form today. The patient and family outlined their wishes for the following treatment decisions:  Cardiopulmonary Resuscitation: Do Not Attempt Resuscitation (DNR/No CPR)  Medical Interventions: Limited Additional Interventions: Use medical treatment, IV  fluids and cardiac monitoring as indicated, DO NOT USE intubation or mechanical ventilation. May consider use of less invasive airway support such as BiPAP or CPAP. Also provide comfort measures. Transfer to the hospital if indicated. Avoid intensive care.   Antibiotics: Determine use of limitation of antibiotics when infection occurs  IV Fluids: IV fluids for a defined trial period  Feeding Tube: No feeding tube   MOST form sent to medical records for download in 2 epic.  Copy placed in shadow chart.  We discussed that patient has started to show signs of not wanting to eat or swallow when given meals.  She states she is not hungry.  Daughter shares that when food is put into her mouth, she moves around in her mouth a lot and has to be reminded several times to swallow.  She is swallowing infrequently.  Daughter denies issues with chewing and swallowing.  She states the patient just does not want to eat.  We again discussed dementia as a chronic, progressive, and irreversible disease that is often exacerbated by acute hospitalizations and illnesses.  Also reviewed this could be hospital delirium.  Discussed that if patient continues to not want to eat or drink family does not want to force it on her.  Additionally, patient would never be accepting of a feeding tube or artificial nutrition.  I discussed with daughter that if patient continues to not want to eat or drink, then working with the palliative medicine team to discuss when to enact hospice benefits could be beneficial.  I do not believe patient is hospice  eligible at this time but if she continues to deteriorate and cognitive abilities then she could present as a candidate.  Daughter was appreciative and understanding.  Outpatient palliative referral placed for TOC to offer choice.  Dr. Shella Maxim concern over shared room.  I spoke with staff and patient will be transferred to private room shortly.  Conveyed to daughter that patient will be moved  and she was extremely grateful.  Therapeutic silence and active listening provided for daughter to share her thoughts and emotions regarding current medical situation.  Emotional support provided.  Daughter made aware that PMT will step back and no longer do daily visits. However, PMT remains available to patient and family throughout her hospitalization.   She and family have PMT contact info and were encouraged to call with any acute palliative needs.   Please re-engage with PMT if goals change, at patient/family's request, or if patient's health deteriorates during hospitalization.   Physical Exam Vitals reviewed.  Constitutional:      General: She is not in acute distress.    Appearance: She is normal weight.  HENT:     Head: Normocephalic.     Nose: Nose normal.     Mouth/Throat:     Mouth: Mucous membranes are moist.  Eyes:     Pupils: Pupils are equal, round, and reactive to light.  Cardiovascular:     Rate and Rhythm: Normal rate.  Pulmonary:     Effort: Pulmonary effort is normal.  Abdominal:     Palpations: Abdomen is soft.  Musculoskeletal:     Comments: Generalized weakness, MAETC  Skin:    General: Skin is warm and dry.  Neurological:     Mental Status: She is alert.     Comments: Oriented to self  Psychiatric:        Behavior: Behavior normal.        Judgment: Judgment normal.             Total Time 50 minutes   Time spent includes: Detailed review of medical records (labs, imaging, vital signs), medically appropriate exam (mental status, respiratory, cardiac, skin), discussed with treatment team, counseling and educating patient, family and staff, documenting clinical information, medication management and coordination of care.  Samara Deist L. Bonita Quin, DNP, FNP-BC Palliative Medicine Team

## 2023-02-14 NOTE — Progress Notes (Signed)
Physical Therapy Treatment Patient Details Name: Vanessa [name] MRN: [id number] DOB: 1934/08/17 Today's Date: 02/14/2023   History of Present Illness Pt is a 88 y.o. female who presented to the ED 02/07/23 with family after being found unresponsive outside of her home around 7:15AM. Family reports the Pt was last seen on cameras around 11PM the night before. When she was found this morning, she was at the bottom on her front steps. At the time of EMS arrival, patient was unresponsive with an initial axillary temperature of 84 degrees and only responding to painful stimuli upon presentation. Family denies behavior changes prior to the event. Pt found to have right intertrochanteric fracture of proximal femur also noted to have low blood pressure. R Intramedullary (IM) Nail Introtrochanteric sx done on 02/07/23.    PT Comments  Pt was long sitting in bed with supportive family at bedside. Pt is agreeable to session but in A fib with HR between 90s and 140s. Per family, pt had poor nights sleep. The pt was agreeable to getting OOB. Still requires extensive max assist of one to achieve EOB sitting. Stood 2 x EOB for hygiene care after BM prior to stand pivot to recliner. Pt remains pain limited overall. Acute PT will continue to follow and progress per current POC.    If plan is discharge home, recommend the following: Direct supervision/assist for medications management;Assist for transportation;Help with stairs or ramp for entrance;Assistance with cooking/housework;Two people to help with walking and/or transfers;Two people to help with bathing/dressing/bathroom     Equipment Recommendations  Other (comment) (Defer to next level of care)       Precautions / Restrictions Precautions Precautions: Fall Precaution Comments: HR quickly elevates with activity. Cardiology aware. MD cleared fro OOB activity even with elevated HR Restrictions Weight Bearing Restrictions: Yes RLE Weight Bearing: Weight  bearing as tolerated     Mobility  Bed Mobility Overal bed mobility: Needs Assistance Bed Mobility: Supine to Sit  Supine to sit: Max assist, HOB elevated, Used rails   Transfers Overall transfer level: Needs assistance Equipment used: None (auhtor [employer] with blocking BLE/knees) Transfers: Sit to/from Stand, Bed to chair/wheelchair/BSC Sit to Stand: Max assist, +2 safety/equipment Stand pivot transfers: Max assist, +2 safety/equipment   Ambulation/Gait    General Gait Details: Still unable to clear LEs to advance to taking steps   Balance Overall balance assessment: Needs assistance Sitting-balance support: Feet supported Sitting balance-Leahy Scale: Fair     Standing balance support: Bilateral upper extremity supported Standing balance-Leahy Scale: Poor       Cognition Arousal: Alert Behavior During Therapy: WFL for tasks assessed/performed Overall Cognitive Status: History of cognitive impairments - at baseline             Pertinent Vitals/Pain Pain Assessment Pain Assessment: 0-10 Pain Location: R hip with movement Pain Descriptors / Indicators: Sore, Operative site guarding, Guarding, Grimacing Pain Intervention(s): Limited activity within patient's tolerance, Monitored during session, Premedicated before session, Repositioned     PT Goals (current goals can now be found in the care plan section) Progress towards PT goals: Progressing toward goals    Frequency    BID           Co-evaluation   Reason for Co-Treatment: Complexity of the patient's impairments (multi-system involvement) PT goals addressed during session: Mobility/safety with mobility;Balance;Proper use of DME;Strengthening/ROM OT goals addressed during session: ADL's and self-care      AM-PAC PT "6 Clicks" Mobility   Outcome Measure  Help needed  turning from your back to your side while in a flat bed without using bedrails?: A Lot Help needed moving from lying on your back  to sitting on the side of a flat bed without using bedrails?: A Lot Help needed moving to and from a bed to a chair (including a wheelchair)?: Total Help needed standing up from a chair using your arms (e.g., wheelchair or bedside chair)?: Total Help needed to walk in hospital room?: Total Help needed climbing 3-5 steps with a railing? : Total 6 Click Score: 8    End of Session Equipment Utilized During Treatment: Gait belt Activity Tolerance: Patient limited by fatigue;Patient limited by pain Patient left: in chair;with call bell/phone within reach;with chair alarm set;with family/visitor present Nurse Communication: Mobility status PT Visit Diagnosis: Unsteadiness on feet (R26.81);Muscle weakness (generalized) (M62.81);Pain Pain - Right/Left: Right Pain - part of body: Hip     Time: 1244-1310 PT Time Calculation (min) (ACUTE ONLY): 26 min  Charges:    $Therapeutic Exercise: 8-22 mins $Therapeutic Activity: 8-22 mins PT General Charges $$ ACUTE PT VISIT: 1 Visit                    Jetta Lout PTA 02/14/23, 4:56 PM

## 2023-02-14 NOTE — Progress Notes (Signed)
Occupational Therapy Treatment Patient Details Name: Vanessa [name] MRN: [id number] DOB: 1934/12/04 Today's Date: 02/14/2023   History of present illness Pt is a 88 y.o. female who presented to the ED 02/07/23 with family after being found unresponsive outside of her home around 7:15AM. Family reports the Pt was last seen on cameras around 11PM the night before. When she was found this morning, she was at the bottom on her front steps. At the time of EMS arrival, patient was unresponsive with an initial axillary temperature of 84 degrees and only responding to painful stimuli upon presentation. Family denies behavior changes prior to the event. Pt found to have right intertrochanteric fracture of proximal femur also noted to have low blood pressure. R Intramedullary (IM) Nail Introtrochanteric sx done on 02/07/23.   OT comments  Vanessa [name] was seen for OT treatment on this date. Upon arrival to room pt seated in chair with PT present, agreeable to tx. Pt requires MAX A x2 for chair>bed t/f and standing at bed for pericare. MAX A don B socks and face washing at bed level. MOD A don/doff gown at bed level. Educated family on positioning for comfort. Pt making progress toward goals, will continue to follow POC. Discharge recommendation remains appropriate.       If plan is discharge home, recommend the following:  Two people to help with walking and/or transfers;Two people to help with bathing/dressing/bathroom;Help with stairs or ramp for entrance   Equipment Recommendations  Other (comment) (defer)    Recommendations for Other Services      Precautions / Restrictions Precautions Precautions: Fall Precaution Comments: HR quickly elevates with activity. Cardiology aware. MD cleared fro OOB activity even with elevated HR Restrictions Weight Bearing Restrictions: Yes RLE Weight Bearing: Weight bearing as tolerated       Mobility Bed Mobility Overal bed mobility: Needs Assistance Bed  Mobility: Sit to Supine       Sit to supine: Total assist, +2 for physical assistance        Transfers Overall transfer level: Needs assistance Equipment used: 2 person hand held assist Transfers: Sit to/from Stand, Bed to chair/wheelchair/BSC Sit to Stand: Max assist, +2 physical assistance   Squat pivot transfers: Max assist, +2 physical assistance             Balance Overall balance assessment: Needs assistance Sitting-balance support: Feet supported Sitting balance-Leahy Scale: Fair     Standing balance support: Bilateral upper extremity supported Standing balance-Leahy Scale: Poor                             ADL either performed or assessed with clinical judgement   ADL Overall ADL's : Needs assistance/impaired                                       General ADL Comments: MAX A x2 for simulated BSC t/f. MAX A don B socks and face washing at bed level. MOD A don/doff gown at bed level      Cognition Arousal: Alert Behavior During Therapy: WFL for tasks assessed/performed Overall Cognitive Status: History of cognitive impairments - at baseline                                 General Comments: states "no no no" with  mobility 2/2 pain, pleasant at rest                   Pertinent Vitals/ Pain       Pain Assessment Pain Assessment: Faces Faces Pain Scale: Hurts even more Pain Location: R hip with movement Pain Descriptors / Indicators: Sore, Operative site guarding, Guarding, Grimacing Pain Intervention(s): Limited activity within patient's tolerance, Repositioned   Frequency  Min 1X/week        Progress Toward Goals  OT Goals(current goals can now be found in the care plan section)  Progress towards OT goals: Progressing toward goals  Acute Rehab OT Goals Patient Stated Goal: to improve pain OT Goal Formulation: With patient/family Time For Goal Achievement: 02/26/23 Potential to Achieve Goals:  Fair ADL Goals Pt Will Perform Grooming: with set-up;with supervision;sitting Pt Will Perform Lower Body Dressing: sitting/lateral leans;with min assist Pt Will Transfer to Toilet: with mod assist;stand pivot transfer;bedside commode Pt Will Perform Toileting - Clothing Manipulation and hygiene: with mod assist;sit to/from stand  Plan      Co-evaluation    PT/OT/SLP Co-Evaluation/Treatment: Yes Reason for Co-Treatment: Complexity of the patient's impairments (multi-system involvement) PT goals addressed during session: Mobility/safety with mobility;Balance;Proper use of DME;Strengthening/ROM OT goals addressed during session: ADL's and self-care      AM-PAC OT "6 Clicks" Daily Activity     Outcome Measure   Help from another person eating meals?: None Help from another person taking care of personal grooming?: A Little Help from another person toileting, which includes using toliet, bedpan, or urinal?: A Lot Help from another person bathing (including washing, rinsing, drying)?: A Lot Help from another person to put on and taking off regular upper body clothing?: A Little Help from another person to put on and taking off regular lower body clothing?: A Lot 6 Click Score: 16    End of Session    OT Visit Diagnosis: Other abnormalities of gait and mobility (R26.89);Muscle weakness (generalized) (M62.81)   Activity Tolerance Patient tolerated treatment well   Patient Left in bed;with call bell/phone within reach;with family/visitor present   Nurse Communication          Time: 7846-9629 OT Time Calculation (min): 23 min  Charges: OT General Charges $OT Visit: 1 Visit OT Treatments $Self Care/Home Management : 8-22 mins  Vanessa [name], M.S. OTR/L  02/14/23, 3:37 PM  ascom [phone]

## 2023-02-14 NOTE — Progress Notes (Signed)
Pt required several canister changes for her wound vac overnight totaling of serous drainage.

## 2023-02-14 NOTE — Plan of Care (Signed)
  Problem: Clinical Measurements: Goal: Ability to maintain clinical measurements within normal limits will improve Outcome: Progressing   Problem: Clinical Measurements: Goal: Respiratory complications will improve Outcome: Progressing   Problem: Clinical Measurements: Goal: Cardiovascular complication will be avoided Outcome: Progressing   Problem: Activity: Goal: Risk for activity intolerance will decrease Outcome: Progressing   

## 2023-02-14 NOTE — Progress Notes (Signed)
Subjective: 7 Days Post-Op Procedure(s) (LRB): INTRAMEDULLARY (IM) NAIL INTERTROCHANTERIC (Right) Patient reports pain as mild.  Daughter state patient has suffering from some delirium Patient with no complaints.  Family at bedside Daughter state small canister and Prevena has been changed 3 times since yesterday  Objective: Vital signs in last 24 hours: Temp:  [97.6 F (36.4 C)-99.4 F (37.4 C)] 98.5 F (36.9 C) (10/24 0345) Pulse Rate:  [63-94] 70 (10/24 0345) Resp:  [16-24] 16 (10/24 0345) BP: (97-116)/(55-72) 105/69 (10/24 0345) SpO2:  [91 %-96 %] 93 % (10/24 0345) Weight:  [59 kg] 59 kg (10/24 0653)  Intake/Output from previous day: 10/23 0701 - 10/24 0700 In: 120 [P.O.:120] Out: 385 [Urine:250; Drains:135] Intake/Output this shift: No intake/output data recorded.  Recent Labs    02/12/23 0349 02/13/23 0348 02/14/23 0452  HGB 9.3* 10.2* 10.7*   Recent Labs    02/13/23 0348 02/14/23 0452  WBC 12.4* 14.3*  RBC 3.43* 3.49*  HCT 31.5* 32.3*  PLT 219 282   Recent Labs    02/12/23 0349 02/13/23 0932  NA 138 137  K 3.8 3.8  CL 102 100  CO2 27 26  BUN 16 16  CREATININE 0.56 0.71  GLUCOSE 99 162*  CALCIUM 8.2* 8.8*   No results for input(s): "LABPT", "INR" in the last 72 hours.  EXAM General - Patient is Alert and Appropriate confused Extremity - Neurologically intact Neurovascular intact Sensation intact distally Intact pulses distally Dorsiflexion/Plantar flexion intact No cellulitis present Compartment soft Dressing -Praveena intact right hip.   Motor Function - intact, moving foot and toes well on exam.   Past Medical History:  Diagnosis Date   Anemia    treated ~2009 with iron, resolved   Arthritis    Cataract    surgery on R catarct 2012   Chronic systolic CHF (congestive heart failure) (HCC)    a. TTE 2012: EF of 45-50%, mild diffuse HK, trivial AI, mild MR, mildly dilated RV with mild reduction of RVSF, mild to moderate TR, mild to  moderate PR, mildly elevated PASP   COPD (chronic obstructive pulmonary disease) (HCC)    Family history of adverse reaction to anesthesia    Mother - altered mental status (long term)   Glaucoma    History of pelvic fracture    Hyperglycemia    mild inc in fasting sugar   Hyperlipidemia    Hypothyroidism    Insomnia    Migraines    without aura, sx since age 61   Osteopenia    prev with 10 years of fosamax and intolerant of evista   PAF (paroxysmal atrial fibrillation) (HCC)    a. initially noted 2012; b. CHADS2VASc at least 5 (CHF, age x 2, vasacular disease, female)   Pulmonary hypertension (HCC)    Wears dentures    full upper and lower    Assessment/Plan:   7 Days Post-Op Procedure(s) (LRB): INTRAMEDULLARY (IM) NAIL INTERTROCHANTERIC (Right) Principal Problem:   Septic shock (HCC)  Estimated body mass index is 22.33 kg/m as calculated from the following:   Height as of this encounter: 5\' 4"  (1.626 m).   Weight as of this encounter: 59 kg. Advance diet Continue with PT, WBAT RLE Pain well controlled VSS Moderate drainage right hip. No signs of infection.  Praveena intact, will order wound VAC to be applied to [facility] unit while patient is in the hospital.  Patient will need follow-up with [facility] orthopedics in 2 weeks     T. Cranston Neighbor,  PA-C [facility] 02/14/2023, 7:39 AM

## 2023-02-14 NOTE — Progress Notes (Signed)
Progress Note   Patient: Vanessa [name] JYN:[id number] DOB: 07-08-1934 DOA: 02/07/2023     7 DOS: the patient was seen and examined on 02/14/2023   Brief hospital course:  88 yo female who presented to the ED on 02/07/2023 with family after being found unresponsive outside of her home early that morning. Family reports the patient was last seen on cameras around 11PM the previous night. She was found at the bottom on her front steps. At the time of EMS arrival, patient was unresponsive with an initial axillary temperature of 84 degrees and only responding to painful stimuli upon presentation. Family denies behavior changes prior to the event.      In the ED, pt was found to have right intertrochanteric fracture of proximal femur and hypotension and lactic acidosis (LA 7.4>>6.7>>5), mild AKI, septic vs hypovolemic shock, metabolic acidosis, possible aspiration pneumonia.  She was given 2 L IVF's, Bair hugger for hypothermia, IV antibiotics, started on Levophed and IV antibiotics.  Admitted to ICU.    Orthopedic surgery was consulted and pt underwent ORIF with nail on evening of 10/17 with Dr. Joice Lofts.   10/18 - low BP's but off pressors. Family reported hx of baseline lower BP's as well.  Palliative care consulted.   10/19 - [facility] assumed care. Called to bedside early AM due to clinical deterioration with ongoing hypotension MAP's adequate.  Pt minimally responsive but would awaken, follow commands, no focal neurologic deficit.  Discussed guarded to poor prognosis with family at bedside, and potential options for aggressive management versus comfort approach.    Palliative met with family again as well.  Seen by Cardiology and family elected not to pursue advanced HF management, leaning towards comfort care.  Repeat Hbg 6.7, transfused 1 unit pRBC's.  Resumed on antibiotics for aspiration PNA.  Plan is 'watchful waiting' over coming hours to days.   10/20 - pt doing better, Hbg improved w transfusion,  8.4 this AM.  BP's improved, remain soft but stable MAP's consistently.  Pt awake, alert, interactive, denies complaints.     10/23-sitting up in a recliner.  Conversant.  Family at bedside   10/24 --tachycardic on the monitor but appears comfortable.  Denies having any pain, no shortness of breath or chest pain   Assessment and Plan:  Septic Shock - resolved.  Due to aspiration PNA. Suspect also hypovolemic component. Presented with hypotension requiring pressor and initial lactic acid 7.4.   Lactate normalized 10/19 to 1.1 Sepsis physiology resolved. Monitor fever curve, CBC, hemodynamics Maintain MAP > 60-65 Continue midodrine, wean as BP tolerates      Aspiration Pneumonia  in setting of fall and prolonged down time, underlying dementia but family deny signs of dysphagia.  Repeat CXR 10/19 - R base consolidation and small pleural effusion.  Procal 6.28 (up from 1.26) --IV Unasyn >> PO Augmentin to complete 5 day course     Right Intertrochanteric Femur Fracture  10/22 -- oozing from site reported, daughter stated ortho might place wound vac --Surgical repair 10/17 by Dr. Joice Lofts  --Control pain as needed --PT consult - SNF recommended & TOC working on placement --DVT ppx on hold due to anemia needing transfusion  --SCD's --Wound VAC in place  for drainage from right wound incision   Acute kidney injury - resolved Cr 1.19 >> 0.83 normalized --Monitor BMP   Hypokalemia - K 3.4 - replaced --Monitor BMP   Acute on Chronic HFpEF BNP 954 this AM, no overt pulmonary edema on CXR, small  R pleural effusion Net IO Since Admission: 4,252.02 mL [02/12/23 1615] --Cardiology following --Resumed on home PO Lasix --BNP now improved 954 >> 318 --Daily weights & strict IO's   History of Paroxysmal A-fib, SVT --Cardiology following - see recs --Telemetry --IV metoprolol push for HR sustain > 135 --Metoprolol for HR control as BP tolerates    Acute metabolic encephalopathy -  resolved Due to hypothermia, septic shock Mentation had reportedly returned to baseline prior to clinical worsening today (10/19) --Continue to manage underlying issues as outlined --Address pain with least sedating meds possible  --Delirium precautions --Address bowel and bladder   Hypokalemia - resolved Monitor Bmp, Mg  Replace electrolytes PRN    Acute Blood Loss Anemia 10/19 Hbg 6.7 this AM (from 10 >> ... 7.8 >> 7.3), transfused 1 unit RBC's >> 9.2 Hbg repeat 10/22 Hbg improved 9.3 No significant bleeding from surgical site reported DVT prophylaxis on hold, SCD's in place Serial Hbg's to monitor Transfuse for Hbg < 7   Type 2 diabetes - Hbg A1c 7.7%, poorly controlled --Sliding scale Novolog -- reduce from moderate 0-15 units to sensitive 0-9 units given CBG's 78, 83 --Hypoglycemia protocol  --Appears not on meds at home --PCP follow up   COPD - stable, not exacerbated --monitor for hypoxia --Duonebs PRN   GERD -  --resume PPI             Subjective: Patient is seen and examined at the bedside.  Daughter is at the bedside.  Physical Exam: Vitals:   02/14/23 0026 02/14/23 0345 02/14/23 0653 02/14/23 0815  BP: 103/72 105/69  (!) 97/59  Pulse:  70  (!) 126  Resp:  16    Temp: 97.6 F (36.4 C) 98.5 F (36.9 C)  98.1 F (36.7 C)  TempSrc: Oral Oral    SpO2: 91% 93%  93%  Weight:   59 kg   Height:       General exam: awake, alert, no acute distress HEENT: moist mucus membranes, hearing grossly normal  Respiratory system: CTAB, no wheezes, rales or rhonchi, normal respiratory effort. Cardiovascular system: normal S1/S2, RRR, no JVD, murmurs, rubs, gallops, no pedal edema.   Gastrointestinal system: soft, NT, ND Central nervous system: A&O x1. no gross focal neurologic deficits, normal speech Extremities: left lateral hip wound oozing  Skin: dry, intact, normal temperature, normal color Psychiatry: normal mood, congruent affect   Data Reviewed: Labs  reviewed.  White count 14 There are no new results to review at this time.  Family Communication: Plan of care discussed with patient's daughter at the bedside.  Disposition: Status is: Inpatient Remains inpatient appropriate because: For discharge in a.m.  Planned Discharge Destination: Skilled nursing facility    Time spent: 34 minutes  Author: Lucile Shutters, MD 02/14/2023 3:33 PM  For on call review [email or web address].

## 2023-02-14 NOTE — Progress Notes (Signed)
Physical Therapy Treatment Patient Details Name: Vanessa [name] MRN: [id number] DOB: Apr 11, 1935 Today's Date: 02/14/2023   History of Present Illness Pt is a 88 y.o. female who presented to the ED 02/07/23 with family after being found unresponsive outside of her home around 7:15AM. Family reports the Pt was last seen on cameras around 11PM the night before. When she was found this morning, she was at the bottom on her front steps. At the time of EMS arrival, patient was unresponsive with an initial axillary temperature of 84 degrees and only responding to painful stimuli upon presentation. Family denies behavior changes prior to the event. Pt found to have right intertrochanteric fracture of proximal femur also noted to have low blood pressure. R Intramedullary (IM) Nail Introtrochanteric sx done on 02/07/23.    PT Comments  Author returned to assist RN staff with returning  pt to bed from recliner. +2 assistance required to safely stand pivot from recliner to EOB. She remains pain limited. HR jumps to 140s. OT remained in room and completed OT session. Pt remains far from baseline and will continue to benefit from skilled PT to maximize her independence and safety with all ADLs.     If plan is discharge home, recommend the following: Direct supervision/assist for medications management;Assist for transportation;Help with stairs or ramp for entrance;Assistance with cooking/housework;Two people to help with walking and/or transfers;Two people to help with bathing/dressing/bathroom     Equipment Recommendations  Other (comment) (Defer to next level of care)       Precautions / Restrictions Precautions Precautions: Fall Precaution Comments: HR quickly elevates with activity. Cardiology aware. MD cleared fro OOB activity even with elevated HR Restrictions Weight Bearing Restrictions: Yes RLE Weight Bearing: Weight bearing as tolerated     Mobility  Bed Mobility Overal bed mobility: Needs  Assistance Bed Mobility: Sit to Supine  Supine to sit: Max assist, HOB elevated, Used rails Sit to supine: +2 for physical assistance, +2 for safety/equipment, Max assist    Transfers Overall transfer level: Needs assistance Equipment used: 2 person hand held assist Transfers: Bed to chair/wheelchair/BSC Sit to Stand: Max assist, +2 safety/equipment Stand pivot transfers: Max assist, +2 safety/equipment  Squat pivot transfers: Max assist, +2 physical assistance   Ambulation/Gait  General Gait Details: Still unable to clear LEs to advance to taking steps    Balance Overall balance assessment: Needs assistance Sitting-balance support: Feet supported Sitting balance-Leahy Scale: Fair     Standing balance support: Bilateral upper extremity supported Standing balance-Leahy Scale: Poor    Cognition Arousal: Alert Behavior During Therapy: WFL for tasks assessed/performed Overall Cognitive Status: History of cognitive impairments - at baseline             Pertinent Vitals/Pain Pain Assessment Pain Assessment: PAINAD Breathing: occasional labored breathing, short period of hyperventilation Negative Vocalization: occasional moan/groan, low speech, negative/disapproving quality Facial Expression: sad, frightened, frown Body Language: tense, distressed pacing, fidgeting Consolability: unable to console, distract or reassure PAINAD Score: 6 Pain Location: R hip with movement Pain Descriptors / Indicators: Sore, Operative site guarding, Guarding, Grimacing Pain Intervention(s): Limited activity within patient's tolerance, Monitored during session, Premedicated before session, Repositioned     PT Goals (current goals can now be found in the care plan section) Acute Rehab PT Goals Patient Stated Goal: go home Progress towards PT goals: Progressing toward goals    Frequency    BID           Co-evaluation   Reason for Co-Treatment: Complexity of  the patient's impairments  (multi-system involvement) PT goals addressed during session: Mobility/safety with mobility;Balance;Proper use of DME;Strengthening/ROM OT goals addressed during session: ADL's and self-care      AM-PAC PT "6 Clicks" Mobility   Outcome Measure  Help needed turning from your back to your side while in a flat bed without using bedrails?: A Lot Help needed moving from lying on your back to sitting on the side of a flat bed without using bedrails?: A Lot Help needed moving to and from a bed to a chair (including a wheelchair)?: Total Help needed standing up from a chair using your arms (e.g., wheelchair or bedside chair)?: Total Help needed to walk in hospital room?: Total Help needed climbing 3-5 steps with a railing? : Total 6 Click Score: 8    End of Session Equipment Utilized During Treatment: Gait belt Activity Tolerance: Patient limited by pain;Patient limited by fatigue Patient left: in bed;with call bell/phone within reach;with bed alarm set;with family/visitor present Nurse Communication: Mobility status PT Visit Diagnosis: Unsteadiness on feet (R26.81);Muscle weakness (generalized) (M62.81);Pain Pain - Right/Left: Right Pain - part of body: Hip     Time: 7829-5621 PT Time Calculation (min) (ACUTE ONLY): 10 min  Charges:    $Therapeutic Exercise: 8-22 mins $Therapeutic Activity: 8-22 mins PT General Charges $$ ACUTE PT VISIT: 1 Visit                     Jetta Lout PTA 02/14/23, 5:07 PM

## 2023-02-15 DIAGNOSIS — E0965 Drug or chemical induced diabetes mellitus with hyperglycemia: Secondary | ICD-10-CM | POA: Diagnosis not present

## 2023-02-15 DIAGNOSIS — S72142A Displaced intertrochanteric fracture of left femur, initial encounter for closed fracture: Secondary | ICD-10-CM | POA: Diagnosis not present

## 2023-02-15 DIAGNOSIS — D649 Anemia, unspecified: Secondary | ICD-10-CM | POA: Diagnosis not present

## 2023-02-15 DIAGNOSIS — I5042 Chronic combined systolic (congestive) and diastolic (congestive) heart failure: Secondary | ICD-10-CM | POA: Diagnosis not present

## 2023-02-15 DIAGNOSIS — R0989 Other specified symptoms and signs involving the circulatory and respiratory systems: Secondary | ICD-10-CM | POA: Diagnosis not present

## 2023-02-15 DIAGNOSIS — M25552 Pain in left hip: Secondary | ICD-10-CM | POA: Diagnosis not present

## 2023-02-15 DIAGNOSIS — R2681 Unsteadiness on feet: Secondary | ICD-10-CM | POA: Diagnosis not present

## 2023-02-15 DIAGNOSIS — E876 Hypokalemia: Secondary | ICD-10-CM | POA: Diagnosis not present

## 2023-02-15 DIAGNOSIS — E1165 Type 2 diabetes mellitus with hyperglycemia: Secondary | ICD-10-CM | POA: Diagnosis not present

## 2023-02-15 DIAGNOSIS — K449 Diaphragmatic hernia without obstruction or gangrene: Secondary | ICD-10-CM | POA: Diagnosis not present

## 2023-02-15 DIAGNOSIS — M8580 Other specified disorders of bone density and structure, unspecified site: Secondary | ICD-10-CM | POA: Diagnosis not present

## 2023-02-15 DIAGNOSIS — R6521 Severe sepsis with septic shock: Secondary | ICD-10-CM | POA: Diagnosis not present

## 2023-02-15 DIAGNOSIS — S79919A Unspecified injury of unspecified hip, initial encounter: Secondary | ICD-10-CM | POA: Diagnosis not present

## 2023-02-15 DIAGNOSIS — Z79890 Hormone replacement therapy (postmenopausal): Secondary | ICD-10-CM | POA: Diagnosis not present

## 2023-02-15 DIAGNOSIS — R4182 Altered mental status, unspecified: Secondary | ICD-10-CM | POA: Diagnosis not present

## 2023-02-15 DIAGNOSIS — W19XXXA Unspecified fall, initial encounter: Secondary | ICD-10-CM | POA: Diagnosis not present

## 2023-02-15 DIAGNOSIS — R531 Weakness: Secondary | ICD-10-CM | POA: Diagnosis not present

## 2023-02-15 DIAGNOSIS — Z7984 Long term (current) use of oral hypoglycemic drugs: Secondary | ICD-10-CM | POA: Diagnosis not present

## 2023-02-15 DIAGNOSIS — Z87898 Personal history of other specified conditions: Secondary | ICD-10-CM | POA: Diagnosis not present

## 2023-02-15 DIAGNOSIS — S72145A Nondisplaced intertrochanteric fracture of left femur, initial encounter for closed fracture: Secondary | ICD-10-CM | POA: Diagnosis not present

## 2023-02-15 DIAGNOSIS — Z66 Do not resuscitate: Secondary | ICD-10-CM | POA: Diagnosis not present

## 2023-02-15 DIAGNOSIS — E1151 Type 2 diabetes mellitus with diabetic peripheral angiopathy without gangrene: Secondary | ICD-10-CM | POA: Diagnosis not present

## 2023-02-15 DIAGNOSIS — Z79899 Other long term (current) drug therapy: Secondary | ICD-10-CM | POA: Diagnosis not present

## 2023-02-15 DIAGNOSIS — I9589 Other hypotension: Secondary | ICD-10-CM | POA: Diagnosis not present

## 2023-02-15 DIAGNOSIS — R42 Dizziness and giddiness: Secondary | ICD-10-CM | POA: Diagnosis not present

## 2023-02-15 DIAGNOSIS — Z9181 History of falling: Secondary | ICD-10-CM | POA: Diagnosis not present

## 2023-02-15 DIAGNOSIS — S72142D Displaced intertrochanteric fracture of left femur, subsequent encounter for closed fracture with routine healing: Secondary | ICD-10-CM | POA: Diagnosis not present

## 2023-02-15 DIAGNOSIS — M84459A Pathological fracture, hip, unspecified, initial encounter for fracture: Secondary | ICD-10-CM | POA: Diagnosis not present

## 2023-02-15 DIAGNOSIS — R2689 Other abnormalities of gait and mobility: Secondary | ICD-10-CM | POA: Diagnosis not present

## 2023-02-15 DIAGNOSIS — R262 Difficulty in walking, not elsewhere classified: Secondary | ICD-10-CM | POA: Diagnosis not present

## 2023-02-15 DIAGNOSIS — I5030 Unspecified diastolic (congestive) heart failure: Secondary | ICD-10-CM | POA: Diagnosis not present

## 2023-02-15 DIAGNOSIS — S72002A Fracture of unspecified part of neck of left femur, initial encounter for closed fracture: Secondary | ICD-10-CM | POA: Diagnosis not present

## 2023-02-15 DIAGNOSIS — J188 Other pneumonia, unspecified organism: Secondary | ICD-10-CM | POA: Diagnosis not present

## 2023-02-15 DIAGNOSIS — E1169 Type 2 diabetes mellitus with other specified complication: Secondary | ICD-10-CM | POA: Diagnosis not present

## 2023-02-15 DIAGNOSIS — Z741 Need for assistance with personal care: Secondary | ICD-10-CM | POA: Diagnosis not present

## 2023-02-15 DIAGNOSIS — Z96642 Presence of left artificial hip joint: Secondary | ICD-10-CM | POA: Diagnosis not present

## 2023-02-15 DIAGNOSIS — Z043 Encounter for examination and observation following other accident: Secondary | ICD-10-CM | POA: Diagnosis not present

## 2023-02-15 DIAGNOSIS — F03A0 Unspecified dementia, mild, without behavioral disturbance, psychotic disturbance, mood disturbance, and anxiety: Secondary | ICD-10-CM | POA: Diagnosis not present

## 2023-02-15 DIAGNOSIS — Z7982 Long term (current) use of aspirin: Secondary | ICD-10-CM | POA: Diagnosis not present

## 2023-02-15 DIAGNOSIS — G9341 Metabolic encephalopathy: Secondary | ICD-10-CM | POA: Diagnosis present

## 2023-02-15 DIAGNOSIS — R7989 Other specified abnormal findings of blood chemistry: Secondary | ICD-10-CM | POA: Diagnosis not present

## 2023-02-15 DIAGNOSIS — Z6821 Body mass index (BMI) 21.0-21.9, adult: Secondary | ICD-10-CM | POA: Diagnosis not present

## 2023-02-15 DIAGNOSIS — I5020 Unspecified systolic (congestive) heart failure: Secondary | ICD-10-CM | POA: Diagnosis not present

## 2023-02-15 DIAGNOSIS — E119 Type 2 diabetes mellitus without complications: Secondary | ICD-10-CM | POA: Diagnosis not present

## 2023-02-15 DIAGNOSIS — W07XXXA Fall from chair, initial encounter: Secondary | ICD-10-CM | POA: Diagnosis present

## 2023-02-15 DIAGNOSIS — I471 Supraventricular tachycardia, unspecified: Secondary | ICD-10-CM | POA: Diagnosis not present

## 2023-02-15 DIAGNOSIS — J690 Pneumonitis due to inhalation of food and vomit: Secondary | ICD-10-CM | POA: Diagnosis present

## 2023-02-15 DIAGNOSIS — J441 Chronic obstructive pulmonary disease with (acute) exacerbation: Secondary | ICD-10-CM | POA: Diagnosis not present

## 2023-02-15 DIAGNOSIS — Z8709 Personal history of other diseases of the respiratory system: Secondary | ICD-10-CM | POA: Diagnosis not present

## 2023-02-15 DIAGNOSIS — J449 Chronic obstructive pulmonary disease, unspecified: Secondary | ICD-10-CM | POA: Diagnosis not present

## 2023-02-15 DIAGNOSIS — F03C0 Unspecified dementia, severe, without behavioral disturbance, psychotic disturbance, mood disturbance, and anxiety: Secondary | ICD-10-CM | POA: Diagnosis not present

## 2023-02-15 DIAGNOSIS — E782 Mixed hyperlipidemia: Secondary | ICD-10-CM | POA: Diagnosis not present

## 2023-02-15 DIAGNOSIS — D62 Acute posthemorrhagic anemia: Secondary | ICD-10-CM | POA: Diagnosis not present

## 2023-02-15 DIAGNOSIS — R300 Dysuria: Secondary | ICD-10-CM | POA: Diagnosis not present

## 2023-02-15 DIAGNOSIS — F0390 Unspecified dementia without behavioral disturbance: Secondary | ICD-10-CM | POA: Diagnosis not present

## 2023-02-15 DIAGNOSIS — Y92128 Other place in nursing home as the place of occurrence of the external cause: Secondary | ICD-10-CM | POA: Diagnosis not present

## 2023-02-15 DIAGNOSIS — R0602 Shortness of breath: Secondary | ICD-10-CM | POA: Diagnosis not present

## 2023-02-15 DIAGNOSIS — S7291XA Unspecified fracture of right femur, initial encounter for closed fracture: Secondary | ICD-10-CM | POA: Diagnosis present

## 2023-02-15 DIAGNOSIS — R4189 Other symptoms and signs involving cognitive functions and awareness: Secondary | ICD-10-CM | POA: Diagnosis not present

## 2023-02-15 DIAGNOSIS — Z01810 Encounter for preprocedural cardiovascular examination: Secondary | ICD-10-CM | POA: Diagnosis not present

## 2023-02-15 DIAGNOSIS — J849 Interstitial pulmonary disease, unspecified: Secondary | ICD-10-CM | POA: Diagnosis not present

## 2023-02-15 DIAGNOSIS — E440 Moderate protein-calorie malnutrition: Secondary | ICD-10-CM | POA: Diagnosis not present

## 2023-02-15 DIAGNOSIS — I5032 Chronic diastolic (congestive) heart failure: Secondary | ICD-10-CM | POA: Diagnosis not present

## 2023-02-15 DIAGNOSIS — S7291XP Unspecified fracture of right femur, subsequent encounter for closed fracture with malunion: Secondary | ICD-10-CM | POA: Diagnosis not present

## 2023-02-15 DIAGNOSIS — Z515 Encounter for palliative care: Secondary | ICD-10-CM | POA: Diagnosis not present

## 2023-02-15 DIAGNOSIS — A419 Sepsis, unspecified organism: Secondary | ICD-10-CM | POA: Diagnosis not present

## 2023-02-15 DIAGNOSIS — Z87891 Personal history of nicotine dependence: Secondary | ICD-10-CM | POA: Diagnosis not present

## 2023-02-15 DIAGNOSIS — I5023 Acute on chronic systolic (congestive) heart failure: Secondary | ICD-10-CM | POA: Diagnosis not present

## 2023-02-15 DIAGNOSIS — E039 Hypothyroidism, unspecified: Secondary | ICD-10-CM | POA: Diagnosis not present

## 2023-02-15 DIAGNOSIS — W1830XA Fall on same level, unspecified, initial encounter: Secondary | ICD-10-CM | POA: Diagnosis not present

## 2023-02-15 DIAGNOSIS — Z23 Encounter for immunization: Secondary | ICD-10-CM | POA: Diagnosis present

## 2023-02-15 DIAGNOSIS — S72141D Displaced intertrochanteric fracture of right femur, subsequent encounter for closed fracture with routine healing: Secondary | ICD-10-CM | POA: Diagnosis not present

## 2023-02-15 DIAGNOSIS — R799 Abnormal finding of blood chemistry, unspecified: Secondary | ICD-10-CM | POA: Diagnosis not present

## 2023-02-15 DIAGNOSIS — M6281 Muscle weakness (generalized): Secondary | ICD-10-CM | POA: Diagnosis not present

## 2023-02-15 DIAGNOSIS — Z7401 Bed confinement status: Secondary | ICD-10-CM | POA: Diagnosis not present

## 2023-02-15 DIAGNOSIS — I272 Pulmonary hypertension, unspecified: Secondary | ICD-10-CM | POA: Diagnosis not present

## 2023-02-15 DIAGNOSIS — E46 Unspecified protein-calorie malnutrition: Secondary | ICD-10-CM | POA: Diagnosis not present

## 2023-02-15 DIAGNOSIS — Z7189 Other specified counseling: Secondary | ICD-10-CM | POA: Diagnosis not present

## 2023-02-15 DIAGNOSIS — S79929A Unspecified injury of unspecified thigh, initial encounter: Secondary | ICD-10-CM | POA: Diagnosis not present

## 2023-02-15 DIAGNOSIS — I10 Essential (primary) hypertension: Secondary | ICD-10-CM | POA: Diagnosis not present

## 2023-02-15 DIAGNOSIS — R001 Bradycardia, unspecified: Secondary | ICD-10-CM | POA: Diagnosis not present

## 2023-02-15 DIAGNOSIS — R0689 Other abnormalities of breathing: Secondary | ICD-10-CM | POA: Diagnosis not present

## 2023-02-15 DIAGNOSIS — I739 Peripheral vascular disease, unspecified: Secondary | ICD-10-CM | POA: Diagnosis not present

## 2023-02-15 DIAGNOSIS — Z0000 Encounter for general adult medical examination without abnormal findings: Secondary | ICD-10-CM | POA: Diagnosis not present

## 2023-02-15 DIAGNOSIS — I959 Hypotension, unspecified: Secondary | ICD-10-CM | POA: Diagnosis not present

## 2023-02-15 DIAGNOSIS — R278 Other lack of coordination: Secondary | ICD-10-CM | POA: Diagnosis not present

## 2023-02-15 DIAGNOSIS — Y92017 Garden or yard in single-family (private) house as the place of occurrence of the external cause: Secondary | ICD-10-CM | POA: Diagnosis not present

## 2023-02-15 DIAGNOSIS — S72002D Fracture of unspecified part of neck of left femur, subsequent encounter for closed fracture with routine healing: Secondary | ICD-10-CM | POA: Diagnosis not present

## 2023-02-15 DIAGNOSIS — I110 Hypertensive heart disease with heart failure: Secondary | ICD-10-CM | POA: Diagnosis not present

## 2023-02-15 DIAGNOSIS — E872 Acidosis, unspecified: Secondary | ICD-10-CM | POA: Diagnosis not present

## 2023-02-15 DIAGNOSIS — S7290XA Unspecified fracture of unspecified femur, initial encounter for closed fracture: Secondary | ICD-10-CM | POA: Diagnosis not present

## 2023-02-15 DIAGNOSIS — N179 Acute kidney failure, unspecified: Secondary | ICD-10-CM | POA: Diagnosis not present

## 2023-02-15 DIAGNOSIS — G3184 Mild cognitive impairment, so stated: Secondary | ICD-10-CM | POA: Diagnosis not present

## 2023-02-15 DIAGNOSIS — I480 Paroxysmal atrial fibrillation: Secondary | ICD-10-CM | POA: Diagnosis not present

## 2023-02-15 DIAGNOSIS — J9611 Chronic respiratory failure with hypoxia: Secondary | ICD-10-CM | POA: Diagnosis not present

## 2023-02-15 DIAGNOSIS — E785 Hyperlipidemia, unspecified: Secondary | ICD-10-CM | POA: Diagnosis not present

## 2023-02-15 DIAGNOSIS — K219 Gastro-esophageal reflux disease without esophagitis: Secondary | ICD-10-CM | POA: Diagnosis not present

## 2023-02-15 DIAGNOSIS — Z4889 Encounter for other specified surgical aftercare: Secondary | ICD-10-CM | POA: Diagnosis not present

## 2023-02-15 LAB — BASIC METABOLIC PANEL
Anion gap: 10 (ref 5–15)
BUN: 15 mg/dL (ref 8–23)
CO2: 28 mmol/L (ref 22–32)
Calcium: 8.1 mg/dL — ABNORMAL LOW (ref 8.9–10.3)
Chloride: 97 mmol/L — ABNORMAL LOW (ref 98–111)
Creatinine, Ser: 0.66 mg/dL (ref 0.44–1.00)
GFR, Estimated: >60 mL/min (ref >60)
Glucose, Bld: 125 mg/dL — ABNORMAL HIGH (ref 70–99)
Potassium: 3.3 mmol/L — ABNORMAL LOW (ref 3.5–5.1)
Sodium: 135 mmol/L (ref 135–145)

## 2023-02-15 LAB — CBC
HCT: 29.6 % — ABNORMAL LOW (ref 36.0–46.0)
Hemoglobin: 9.8 g/dL — ABNORMAL LOW (ref 12.0–15.0)
MCH: 30.6 pg (ref 26.0–34.0)
MCHC: 33.1 g/dL (ref 30.0–36.0)
MCV: 92.5 fL (ref 80.0–100.0)
Platelets: 330 10*3/uL (ref 150–400)
RBC: 3.2 MIL/uL — ABNORMAL LOW (ref 3.87–5.11)
RDW: 18 % — ABNORMAL HIGH (ref 11.5–15.5)
WBC: 11.9 10*3/uL — ABNORMAL HIGH (ref 4.0–10.5)
nRBC: 0.8 % — ABNORMAL HIGH (ref 0.0–0.2)

## 2023-02-15 LAB — GLUCOSE, CAPILLARY
Glucose-Capillary: 126 mg/dL — ABNORMAL HIGH (ref 70–99)
Glucose-Capillary: 167 mg/dL — ABNORMAL HIGH (ref 70–99)

## 2023-02-15 MED ORDER — MIDODRINE HCL 10 MG PO TABS: 10.0000 mg | ORAL_TABLET | Freq: Three times a day (TID) | ORAL | 0 refills | Status: AC

## 2023-02-15 MED ORDER — INSULIN ASPART 100 UNIT/ML IJ SOLN: 0.0000 [IU] | Freq: Three times a day (TID) | INTRAMUSCULAR | 11 refills | Status: DC

## 2023-02-15 MED ORDER — SENNOSIDES-DOCUSATE SODIUM 8.6-50 MG PO TABS: 1.0000 | ORAL_TABLET | Freq: Two times a day (BID) | ORAL | 0 refills | Status: AC

## 2023-02-15 MED ORDER — ENSURE ENLIVE PO LIQD: 237.0000 mL | Freq: Two times a day (BID) | ORAL | 12 refills | Status: DC

## 2023-02-15 MED ORDER — METOPROLOL SUCCINATE ER 25 MG PO TB24: 25.0000 mg | ORAL_TABLET | Freq: Every day | ORAL | 0 refills | Status: DC

## 2023-02-15 MED ORDER — MELATONIN 5 MG PO TABS: 2.5000 mg | ORAL_TABLET | Freq: Every evening | ORAL | 0 refills | Status: AC | PRN

## 2023-02-15 MED ORDER — TRAMADOL HCL 50 MG PO TABS: 50.0000 mg | ORAL_TABLET | Freq: Four times a day (QID) | ORAL | 0 refills | Status: AC | PRN

## 2023-02-15 MED ORDER — POLYETHYLENE GLYCOL 3350 17 G PO PACK: 17.0000 g | PACK | Freq: Every day | ORAL | 0 refills | Status: DC

## 2023-02-15 NOTE — Discharge Instructions (Signed)
Continue wound VAC until seen by orthopedic surgery.

## 2023-02-15 NOTE — Progress Notes (Signed)
  Subjective: 8 Days Post-Op Procedure(s) (LRB): INTRAMEDULLARY (IM) NAIL INTERTROCHANTERIC (Right) Patient reports pain as mild.  Patient with no complaints.  Family at bedside Small amount of discharge in wound vac  Objective: Vital signs in last 24 hours: Temp:  [98.1 F (36.7 C)-99.1 F (37.3 C)] 98.9 F (37.2 C) (10/25 0800) Pulse Rate:  [62-126] 86 (10/25 0800) Resp:  [16-18] 16 (10/25 0721) BP: (93-118)/(48-74) 113/63 (10/25 0800) SpO2:  [92 %-97 %] 97 % (10/25 0800) Weight:  [58.7 kg] 58.7 kg (10/25 0529)  Intake/Output from previous day: 10/24 0701 - 10/25 0700 In: -  Out: 900 [Urine:850; Drains:50] Intake/Output this shift: No intake/output data recorded.  Recent Labs    02/13/23 0348 02/14/23 0452 02/15/23 0326  HGB 10.2* 10.7* 9.8*   Recent Labs    02/14/23 0452 02/15/23 0326  WBC 14.3* 11.9*  RBC 3.49* 3.20*  HCT 32.3* 29.6*  PLT 282 330   Recent Labs    02/13/23 0932 02/15/23 0326  NA 137 135  K 3.8 3.3*  CL 100 97*  CO2 26 28  BUN 16 15  CREATININE 0.71 0.66  GLUCOSE 162* 125*  CALCIUM 8.8* 8.1*   No results for input(s): "LABPT", "INR" in the last 72 hours.  EXAM General - Patient is Alert and Appropriate  Extremity - Neurologically intact Neurovascular intact Sensation intact distally Intact pulses distally Dorsiflexion/Plantar flexion intact No cellulitis present Compartment soft Dressing -Praveena intact right hip.   Motor Function - intact, moving foot and toes well on exam.   Past Medical History:  Diagnosis Date   Anemia    treated ~2009 with iron, resolved   Arthritis    Cataract    surgery on R catarct 2012   Chronic systolic CHF (congestive heart failure) (HCC)    a. TTE 2012: EF of 45-50%, mild diffuse HK, trivial AI, mild MR, mildly dilated RV with mild reduction of RVSF, mild to moderate TR, mild to moderate PR, mildly elevated PASP   COPD (chronic obstructive pulmonary disease) (HCC)    Family history of  adverse reaction to anesthesia    Mother - altered mental status (long term)   Glaucoma    History of pelvic fracture    Hyperglycemia    mild inc in fasting sugar   Hyperlipidemia    Hypothyroidism    Insomnia    Migraines    without aura, sx since age 64   Osteopenia    prev with 10 years of fosamax and intolerant of evista   PAF (paroxysmal atrial fibrillation) (HCC)    a. initially noted 2012; b. CHADS2VASc at least 5 (CHF, age x 2, vasacular disease, female)   Pulmonary hypertension (HCC)    Wears dentures    full upper and lower    Assessment/Plan:   8 Days Post-Op Procedure(s) (LRB): INTRAMEDULLARY (IM) NAIL INTERTROCHANTERIC (Right) Principal Problem:   Septic shock (HCC)  Estimated body mass index is 22.21 kg/m as calculated from the following:   Height as of this encounter: 5\' 4"  (1.626 m).   Weight as of this encounter: 58.7 kg. Advance diet Continue with PT, WBAT RLE Pain well controlled VSS Drainage improved significantly. Plan on going to SNF with provena. Will need extra canisters at discharge to SNF.  Patient will need follow-up with [facility] orthopedics in 2 weeks     T. Cranston Neighbor, PA-C [facility] 02/15/2023, 8:14 AM

## 2023-02-15 NOTE — TOC Transition Note (Addendum)
Transition of Care [facility]) - CM/SW Discharge Note   Patient Details  Name: Vanessa [name] MRN: [id number] Date of Birth: 12/01/1934  Transition of Care [facility]) CM/SW Contact:  Truddie Hidden, RN Phone Number: 02/15/2023, 11:09 AM   Clinical Narrative:    Attempt to reach Sue Lush in admissions at [facility] No answer. Left a message.  Discharge summary and SNF transfer report sent in HUB.   Spoke with Cyrstal in admissions at [facility]  Per facility patient admission confirmed for today. Patient assigned room # 103 Nurse will call report to [phone] Face sheet and medical necessity forms printed to the floor to be added to the EMS pack EMS arranged "First on list when truck is available."  Discharge summary and SNF transfer report sent in HUB.  Nurse, and family notified spoke with Albin Felling, her daughter.  TOC signing off.   Spoke with Crystal from [facility] to confirm patient has a Praveena vac with  NPWT canisters. Extra canisters will be sent to facility.         Patient Goals and CMS Choice      Discharge Placement                         Discharge Plan and Services Additional resources added to the After Visit Summary for                                       Social Determinants of Health (SDOH) Interventions SDOH Screenings   Food Insecurity: No Food Insecurity (02/08/2023)  Housing: Low Risk  (02/08/2023)  Transportation Needs: No Transportation Needs (02/08/2023)  Utilities: Not At Risk (02/08/2023)  Alcohol Screen: Low Risk  (06/13/2022)  Depression (PHQ2-9): Low Risk  (08/10/2022)  Financial Resource Strain: Low Risk  (08/10/2022)  Physical Activity: Inactive (08/10/2022)  Social Connections: Unknown (08/10/2022)  Recent Concern: Social Connections - Moderately Isolated (06/13/2022)  Stress: No Stress Concern Present (08/10/2022)  Tobacco Use: Medium Risk (02/07/2023)     Readmission Risk Interventions     No data to display

## 2023-02-15 NOTE — Consult Note (Signed)
[facility] Customer service manager [facility]) Accountable Care Organization (ACO) [facility] Liaison Note  02/15/2023  Vanessa [name] [birth year] [id number]  Location: [facility] RN Hospital Liaison screened the patient remotely at [facility].  Insurance: [employer]   Vanessa [name] is a 88 y.o. female who is a Primary Care Patient of Para March, Dwana Curd, MD at [facility] at [place]. The patient was screened for  readmission hospitalization with noted high risk score for unplanned readmission risk with 1 IP in 6 months.  The patient was assessed for potential [facility]) Care Management service needs for post hospital transition for care coordination. Review of patient's electronic medical record reveals patient with Septic Shock. Pt discharging to SNF level of care. The facility will address pt's ongoing needs. [facility] PAC-RN does not follow this insurance carrier via SNF level of care.  [facility] Care Management/Population Health does not replace or interfere with any arrangements made by the Inpatient Transition of Care team.   For questions contact:   Elliot Cousin, RN, [facility] Liaison [place]   [facility], Population Health Office Hours MTWF  8:00 am-6:00 pm Direct Dial: [phone] mobile [phone] [Office toll free line] Office Hours are M-F 8:30 - 5 pm [email or web address]

## 2023-02-15 NOTE — Care Management Important Message (Signed)
Important Message  Patient Details  Name: ANNEBELLE FICHTER MRN: [id number] Date of Birth: 15-Aug-1934   Important Message Given:  Yes - Medicare IM     Truddie Hidden, RN 02/15/2023, 12:23 PM

## 2023-02-15 NOTE — Discharge Summary (Signed)
Physician Discharge Summary   Patient: Vanessa [name] MRN: [id number] DOB: 1934/06/08  Admit date:     02/07/2023  Discharge date: 02/15/23  Discharge Physician: Geraldy Akridge   PCP: Joaquim Nam, MD   Recommendations at discharge:   Take medications as recommended Follow-up with [facility] orthopedics in 2 weeks Continue physical therapy, weightbearing as tolerated on right lower extremity  Discharge Diagnoses: Principal Problem:   Septic shock (HCC) Active Problems:   SVT (supraventricular tachycardia) (HCC)   Acute on chronic systolic CHF (congestive heart failure) (HCC)   History of chronic   Femur fracture, right (HCC)   Diabetes mellitus with hyperglycemia (HCC)   Mild cognitive impairment   Hypokalemia   Acute metabolic encephalopathy   ABLA (acute blood loss anemia)  Resolved Problems:   * No resolved hospital problems. *  Hospital Course:  88 yo female who presented to the ED on 02/07/2023 with family after being found unresponsive outside of her home early that morning. Family reports the patient was last seen on cameras around 11PM the previous night. She was found at the bottom on her front steps. At the time of EMS arrival, patient was unresponsive with an initial axillary temperature of 84 degrees and only responding to painful stimuli upon presentation. Family denies behavior changes prior to the event.      In the ED, pt was found to have right intertrochanteric fracture of proximal femur and hypotension and lactic acidosis (LA 7.4>>6.7>>5), mild AKI, septic vs hypovolemic shock, metabolic acidosis, possible aspiration pneumonia.  She was given 2 L IVF's, Bair hugger for hypothermia, IV antibiotics, started on Levophed and IV antibiotics.  Admitted to ICU.    Orthopedic surgery was consulted and pt underwent ORIF with nail on evening of 10/17 with Dr. Joice Lofts.   10/18 - low BP's but off pressors. Family reported hx of baseline lower BP's as well.  Palliative care  consulted.   10/19 - [facility] assumed care. Called to bedside early AM due to clinical deterioration with ongoing hypotension MAP's adequate.  Pt minimally responsive but would awaken, follow commands, no focal neurologic deficit.  Discussed guarded to poor prognosis with family at bedside, and potential options for aggressive management versus comfort approach.    Palliative met with family again as well.  Seen by Cardiology and family elected not to pursue advanced HF management, leaning towards comfort care.  Repeat Hbg 6.7, transfused 1 unit pRBC's.  Resumed on antibiotics for aspiration PNA.  Plan is 'watchful waiting' over coming hours to days.   10/20 - pt doing better, Hbg improved w transfusion, 8.4 this AM.  BP's improved, remain soft but stable MAP's consistently.  Pt awake, alert, interactive, denies complaints.     10/23-sitting up in a recliner.  Conversant.  Family at bedside   10/24 --tachycardic on the monitor but appears comfortable.  Denies having any pain, no shortness of breath or chest pain       Assessment and Plan:  Septic Shock - resolved.  Due to aspiration PNA. Suspect also hypovolemic component. Presented with hypotension requiring pressors and initial lactic acid 7.4.   Lactate normalized 10/19 to 1.1 Sepsis physiology resolved. Monitor fever curve, CBC, hemodynamics Maintain MAP > 60-65 Continue midodrine, wean as BP tolerates      Aspiration Pneumonia  in setting of fall and prolonged down time, underlying dementia but family deny signs of dysphagia.  Repeat CXR 10/19 - R base consolidation and small pleural effusion.  Procal 6.28 (up  insulin aspart 100 UNIT/ML injection Commonly known as: novoLOG Inject 0-9 Units into the skin 3 (three) times daily with meals.   levothyroxine 25 MCG tablet Commonly known as: SYNTHROID TAKE 1 TABLET BY MOUTH ONCE DAILY ON AN EMPTY STOMACH. WAIT 30 MINUTES BEFORE TAKING OTHER MEDS.   melatonin 5 MG Tabs Take 0.5 tablets (2.5 mg total) by mouth at bedtime as needed.   metoprolol succinate 25 MG 24 hr tablet Commonly known as: TOPROL-XL Take 1 tablet (25 mg total) by mouth daily.   midodrine 10 MG tablet Commonly known as: PROAMATINE Take 1 tablet (10 mg total) by mouth 3 (three) times daily with meals.   pantoprazole 20 MG tablet Commonly known as: PROTONIX Take 20 mg by mouth 2 (two) times daily before a meal.   polyethylene glycol 17 g packet Commonly known as: MIRALAX / GLYCOLAX Take 17 g by mouth daily.   Potassium 99 MG  Tabs Take 2 tablets (198 mg total) by mouth daily.   Prevagen Extra Strength 20 MG Caps Generic drug: Apoaequorin Take 20 mg by mouth in the morning.   senna-docusate 8.6-50 MG tablet Commonly known as: Senokot-S Take 1 tablet by mouth 2 (two) times daily.   Vitamin D 50 MCG (2000 UT) Caps Take 1 capsule (2,000 Units total) by mouth daily.        Follow-up Information     Alwyn Pea, MD. Go in 1 week(s).   Specialties: Cardiology, Internal Medicine Contact information: [address] [place] [address] [phone]         Anson Oregon, PA-C Follow up in 2 week(s).   Specialty: Physician Assistant Contact information: [address] [place] [address] [phone]                Discharge Exam: Filed Weights   02/13/23 0357 02/14/23 0653 02/15/23 0529  Weight: 61 kg 59 kg 58.7 kg    General exam: awake, alert, no acute distress HEENT: moist mucus membranes, hearing grossly normal  Respiratory system: CTAB, no wheezes, rales or rhonchi, normal respiratory effort. Cardiovascular system: normal S1/S2, RRR, no JVD, murmurs, rubs, gallops, no pedal edema.   Gastrointestinal system: soft, NT, ND Central nervous system: A&O x1. no gross focal neurologic deficits, normal speech Extremities: left lateral hip wound oozing  Skin: dry, intact, normal temperature, normal color Psychiatry: normal mood, congruent affect       Condition at discharge: stable  The results of significant diagnostics from this hospitalization (including imaging, microbiology, ancillary and laboratory) are listed below for reference.   Imaging Studies: ECHOCARDIOGRAM COMPLETE  Result Date: 02/09/2023    ECHOCARDIOGRAM REPORT   Patient Name:   Vanessa [name] Date of Exam: 02/08/2023 Medical Rec #:  [id number]        Height:       64.0 in Accession #:    [id number]       Weight:       131.6 lb Date of Birth:  July 04, 1934        BSA:          1.638 m  Patient Age:    88 years         BP:           84/58 mmHg Patient Gender: F                HR:           139 bpm. Exam Location:  [facility] Procedure: 2D  Physician Discharge Summary   Patient: Vanessa [name] MRN: [id number] DOB: 1934/06/08  Admit date:     02/07/2023  Discharge date: 02/15/23  Discharge Physician: Geraldy Akridge   PCP: Joaquim Nam, MD   Recommendations at discharge:   Take medications as recommended Follow-up with [facility] orthopedics in 2 weeks Continue physical therapy, weightbearing as tolerated on right lower extremity  Discharge Diagnoses: Principal Problem:   Septic shock (HCC) Active Problems:   SVT (supraventricular tachycardia) (HCC)   Acute on chronic systolic CHF (congestive heart failure) (HCC)   History of chronic   Femur fracture, right (HCC)   Diabetes mellitus with hyperglycemia (HCC)   Mild cognitive impairment   Hypokalemia   Acute metabolic encephalopathy   ABLA (acute blood loss anemia)  Resolved Problems:   * No resolved hospital problems. *  Hospital Course:  88 yo female who presented to the ED on 02/07/2023 with family after being found unresponsive outside of her home early that morning. Family reports the patient was last seen on cameras around 11PM the previous night. She was found at the bottom on her front steps. At the time of EMS arrival, patient was unresponsive with an initial axillary temperature of 84 degrees and only responding to painful stimuli upon presentation. Family denies behavior changes prior to the event.      In the ED, pt was found to have right intertrochanteric fracture of proximal femur and hypotension and lactic acidosis (LA 7.4>>6.7>>5), mild AKI, septic vs hypovolemic shock, metabolic acidosis, possible aspiration pneumonia.  She was given 2 L IVF's, Bair hugger for hypothermia, IV antibiotics, started on Levophed and IV antibiotics.  Admitted to ICU.    Orthopedic surgery was consulted and pt underwent ORIF with nail on evening of 10/17 with Dr. Joice Lofts.   10/18 - low BP's but off pressors. Family reported hx of baseline lower BP's as well.  Palliative care  consulted.   10/19 - [facility] assumed care. Called to bedside early AM due to clinical deterioration with ongoing hypotension MAP's adequate.  Pt minimally responsive but would awaken, follow commands, no focal neurologic deficit.  Discussed guarded to poor prognosis with family at bedside, and potential options for aggressive management versus comfort approach.    Palliative met with family again as well.  Seen by Cardiology and family elected not to pursue advanced HF management, leaning towards comfort care.  Repeat Hbg 6.7, transfused 1 unit pRBC's.  Resumed on antibiotics for aspiration PNA.  Plan is 'watchful waiting' over coming hours to days.   10/20 - pt doing better, Hbg improved w transfusion, 8.4 this AM.  BP's improved, remain soft but stable MAP's consistently.  Pt awake, alert, interactive, denies complaints.     10/23-sitting up in a recliner.  Conversant.  Family at bedside   10/24 --tachycardic on the monitor but appears comfortable.  Denies having any pain, no shortness of breath or chest pain       Assessment and Plan:  Septic Shock - resolved.  Due to aspiration PNA. Suspect also hypovolemic component. Presented with hypotension requiring pressors and initial lactic acid 7.4.   Lactate normalized 10/19 to 1.1 Sepsis physiology resolved. Monitor fever curve, CBC, hemodynamics Maintain MAP > 60-65 Continue midodrine, wean as BP tolerates      Aspiration Pneumonia  in setting of fall and prolonged down time, underlying dementia but family deny signs of dysphagia.  Repeat CXR 10/19 - R base consolidation and small pleural effusion.  Procal 6.28 (up  Physician Discharge Summary   Patient: Vanessa [name] MRN: [id number] DOB: 1934/06/08  Admit date:     02/07/2023  Discharge date: 02/15/23  Discharge Physician: Geraldy Akridge   PCP: Joaquim Nam, MD   Recommendations at discharge:   Take medications as recommended Follow-up with [facility] orthopedics in 2 weeks Continue physical therapy, weightbearing as tolerated on right lower extremity  Discharge Diagnoses: Principal Problem:   Septic shock (HCC) Active Problems:   SVT (supraventricular tachycardia) (HCC)   Acute on chronic systolic CHF (congestive heart failure) (HCC)   History of chronic   Femur fracture, right (HCC)   Diabetes mellitus with hyperglycemia (HCC)   Mild cognitive impairment   Hypokalemia   Acute metabolic encephalopathy   ABLA (acute blood loss anemia)  Resolved Problems:   * No resolved hospital problems. *  Hospital Course:  88 yo female who presented to the ED on 02/07/2023 with family after being found unresponsive outside of her home early that morning. Family reports the patient was last seen on cameras around 11PM the previous night. She was found at the bottom on her front steps. At the time of EMS arrival, patient was unresponsive with an initial axillary temperature of 84 degrees and only responding to painful stimuli upon presentation. Family denies behavior changes prior to the event.      In the ED, pt was found to have right intertrochanteric fracture of proximal femur and hypotension and lactic acidosis (LA 7.4>>6.7>>5), mild AKI, septic vs hypovolemic shock, metabolic acidosis, possible aspiration pneumonia.  She was given 2 L IVF's, Bair hugger for hypothermia, IV antibiotics, started on Levophed and IV antibiotics.  Admitted to ICU.    Orthopedic surgery was consulted and pt underwent ORIF with nail on evening of 10/17 with Dr. Joice Lofts.   10/18 - low BP's but off pressors. Family reported hx of baseline lower BP's as well.  Palliative care  consulted.   10/19 - [facility] assumed care. Called to bedside early AM due to clinical deterioration with ongoing hypotension MAP's adequate.  Pt minimally responsive but would awaken, follow commands, no focal neurologic deficit.  Discussed guarded to poor prognosis with family at bedside, and potential options for aggressive management versus comfort approach.    Palliative met with family again as well.  Seen by Cardiology and family elected not to pursue advanced HF management, leaning towards comfort care.  Repeat Hbg 6.7, transfused 1 unit pRBC's.  Resumed on antibiotics for aspiration PNA.  Plan is 'watchful waiting' over coming hours to days.   10/20 - pt doing better, Hbg improved w transfusion, 8.4 this AM.  BP's improved, remain soft but stable MAP's consistently.  Pt awake, alert, interactive, denies complaints.     10/23-sitting up in a recliner.  Conversant.  Family at bedside   10/24 --tachycardic on the monitor but appears comfortable.  Denies having any pain, no shortness of breath or chest pain       Assessment and Plan:  Septic Shock - resolved.  Due to aspiration PNA. Suspect also hypovolemic component. Presented with hypotension requiring pressors and initial lactic acid 7.4.   Lactate normalized 10/19 to 1.1 Sepsis physiology resolved. Monitor fever curve, CBC, hemodynamics Maintain MAP > 60-65 Continue midodrine, wean as BP tolerates      Aspiration Pneumonia  in setting of fall and prolonged down time, underlying dementia but family deny signs of dysphagia.  Repeat CXR 10/19 - R base consolidation and small pleural effusion.  Procal 6.28 (up  insulin aspart 100 UNIT/ML injection Commonly known as: novoLOG Inject 0-9 Units into the skin 3 (three) times daily with meals.   levothyroxine 25 MCG tablet Commonly known as: SYNTHROID TAKE 1 TABLET BY MOUTH ONCE DAILY ON AN EMPTY STOMACH. WAIT 30 MINUTES BEFORE TAKING OTHER MEDS.   melatonin 5 MG Tabs Take 0.5 tablets (2.5 mg total) by mouth at bedtime as needed.   metoprolol succinate 25 MG 24 hr tablet Commonly known as: TOPROL-XL Take 1 tablet (25 mg total) by mouth daily.   midodrine 10 MG tablet Commonly known as: PROAMATINE Take 1 tablet (10 mg total) by mouth 3 (three) times daily with meals.   pantoprazole 20 MG tablet Commonly known as: PROTONIX Take 20 mg by mouth 2 (two) times daily before a meal.   polyethylene glycol 17 g packet Commonly known as: MIRALAX / GLYCOLAX Take 17 g by mouth daily.   Potassium 99 MG  Tabs Take 2 tablets (198 mg total) by mouth daily.   Prevagen Extra Strength 20 MG Caps Generic drug: Apoaequorin Take 20 mg by mouth in the morning.   senna-docusate 8.6-50 MG tablet Commonly known as: Senokot-S Take 1 tablet by mouth 2 (two) times daily.   Vitamin D 50 MCG (2000 UT) Caps Take 1 capsule (2,000 Units total) by mouth daily.        Follow-up Information     Alwyn Pea, MD. Go in 1 week(s).   Specialties: Cardiology, Internal Medicine Contact information: [address] [place] [address] [phone]         Anson Oregon, PA-C Follow up in 2 week(s).   Specialty: Physician Assistant Contact information: [address] [place] [address] [phone]                Discharge Exam: Filed Weights   02/13/23 0357 02/14/23 0653 02/15/23 0529  Weight: 61 kg 59 kg 58.7 kg    General exam: awake, alert, no acute distress HEENT: moist mucus membranes, hearing grossly normal  Respiratory system: CTAB, no wheezes, rales or rhonchi, normal respiratory effort. Cardiovascular system: normal S1/S2, RRR, no JVD, murmurs, rubs, gallops, no pedal edema.   Gastrointestinal system: soft, NT, ND Central nervous system: A&O x1. no gross focal neurologic deficits, normal speech Extremities: left lateral hip wound oozing  Skin: dry, intact, normal temperature, normal color Psychiatry: normal mood, congruent affect       Condition at discharge: stable  The results of significant diagnostics from this hospitalization (including imaging, microbiology, ancillary and laboratory) are listed below for reference.   Imaging Studies: ECHOCARDIOGRAM COMPLETE  Result Date: 02/09/2023    ECHOCARDIOGRAM REPORT   Patient Name:   Vanessa [name] Date of Exam: 02/08/2023 Medical Rec #:  [id number]        Height:       64.0 in Accession #:    [id number]       Weight:       131.6 lb Date of Birth:  July 04, 1934        BSA:          1.638 m  Patient Age:    88 years         BP:           84/58 mmHg Patient Gender: F                HR:           139 bpm. Exam Location:  [facility] Procedure: 2D  insulin aspart 100 UNIT/ML injection Commonly known as: novoLOG Inject 0-9 Units into the skin 3 (three) times daily with meals.   levothyroxine 25 MCG tablet Commonly known as: SYNTHROID TAKE 1 TABLET BY MOUTH ONCE DAILY ON AN EMPTY STOMACH. WAIT 30 MINUTES BEFORE TAKING OTHER MEDS.   melatonin 5 MG Tabs Take 0.5 tablets (2.5 mg total) by mouth at bedtime as needed.   metoprolol succinate 25 MG 24 hr tablet Commonly known as: TOPROL-XL Take 1 tablet (25 mg total) by mouth daily.   midodrine 10 MG tablet Commonly known as: PROAMATINE Take 1 tablet (10 mg total) by mouth 3 (three) times daily with meals.   pantoprazole 20 MG tablet Commonly known as: PROTONIX Take 20 mg by mouth 2 (two) times daily before a meal.   polyethylene glycol 17 g packet Commonly known as: MIRALAX / GLYCOLAX Take 17 g by mouth daily.   Potassium 99 MG  Tabs Take 2 tablets (198 mg total) by mouth daily.   Prevagen Extra Strength 20 MG Caps Generic drug: Apoaequorin Take 20 mg by mouth in the morning.   senna-docusate 8.6-50 MG tablet Commonly known as: Senokot-S Take 1 tablet by mouth 2 (two) times daily.   Vitamin D 50 MCG (2000 UT) Caps Take 1 capsule (2,000 Units total) by mouth daily.        Follow-up Information     Alwyn Pea, MD. Go in 1 week(s).   Specialties: Cardiology, Internal Medicine Contact information: [address] [place] [address] [phone]         Anson Oregon, PA-C Follow up in 2 week(s).   Specialty: Physician Assistant Contact information: [address] [place] [address] [phone]                Discharge Exam: Filed Weights   02/13/23 0357 02/14/23 0653 02/15/23 0529  Weight: 61 kg 59 kg 58.7 kg    General exam: awake, alert, no acute distress HEENT: moist mucus membranes, hearing grossly normal  Respiratory system: CTAB, no wheezes, rales or rhonchi, normal respiratory effort. Cardiovascular system: normal S1/S2, RRR, no JVD, murmurs, rubs, gallops, no pedal edema.   Gastrointestinal system: soft, NT, ND Central nervous system: A&O x1. no gross focal neurologic deficits, normal speech Extremities: left lateral hip wound oozing  Skin: dry, intact, normal temperature, normal color Psychiatry: normal mood, congruent affect       Condition at discharge: stable  The results of significant diagnostics from this hospitalization (including imaging, microbiology, ancillary and laboratory) are listed below for reference.   Imaging Studies: ECHOCARDIOGRAM COMPLETE  Result Date: 02/09/2023    ECHOCARDIOGRAM REPORT   Patient Name:   Vanessa [name] Date of Exam: 02/08/2023 Medical Rec #:  [id number]        Height:       64.0 in Accession #:    [id number]       Weight:       131.6 lb Date of Birth:  July 04, 1934        BSA:          1.638 m  Patient Age:    88 years         BP:           84/58 mmHg Patient Gender: F                HR:           139 bpm. Exam Location:  [facility] Procedure: 2D  Physician Discharge Summary   Patient: Vanessa [name] MRN: [id number] DOB: 1934/06/08  Admit date:     02/07/2023  Discharge date: 02/15/23  Discharge Physician: Geraldy Akridge   PCP: Joaquim Nam, MD   Recommendations at discharge:   Take medications as recommended Follow-up with [facility] orthopedics in 2 weeks Continue physical therapy, weightbearing as tolerated on right lower extremity  Discharge Diagnoses: Principal Problem:   Septic shock (HCC) Active Problems:   SVT (supraventricular tachycardia) (HCC)   Acute on chronic systolic CHF (congestive heart failure) (HCC)   History of chronic   Femur fracture, right (HCC)   Diabetes mellitus with hyperglycemia (HCC)   Mild cognitive impairment   Hypokalemia   Acute metabolic encephalopathy   ABLA (acute blood loss anemia)  Resolved Problems:   * No resolved hospital problems. *  Hospital Course:  88 yo female who presented to the ED on 02/07/2023 with family after being found unresponsive outside of her home early that morning. Family reports the patient was last seen on cameras around 11PM the previous night. She was found at the bottom on her front steps. At the time of EMS arrival, patient was unresponsive with an initial axillary temperature of 84 degrees and only responding to painful stimuli upon presentation. Family denies behavior changes prior to the event.      In the ED, pt was found to have right intertrochanteric fracture of proximal femur and hypotension and lactic acidosis (LA 7.4>>6.7>>5), mild AKI, septic vs hypovolemic shock, metabolic acidosis, possible aspiration pneumonia.  She was given 2 L IVF's, Bair hugger for hypothermia, IV antibiotics, started on Levophed and IV antibiotics.  Admitted to ICU.    Orthopedic surgery was consulted and pt underwent ORIF with nail on evening of 10/17 with Dr. Joice Lofts.   10/18 - low BP's but off pressors. Family reported hx of baseline lower BP's as well.  Palliative care  consulted.   10/19 - [facility] assumed care. Called to bedside early AM due to clinical deterioration with ongoing hypotension MAP's adequate.  Pt minimally responsive but would awaken, follow commands, no focal neurologic deficit.  Discussed guarded to poor prognosis with family at bedside, and potential options for aggressive management versus comfort approach.    Palliative met with family again as well.  Seen by Cardiology and family elected not to pursue advanced HF management, leaning towards comfort care.  Repeat Hbg 6.7, transfused 1 unit pRBC's.  Resumed on antibiotics for aspiration PNA.  Plan is 'watchful waiting' over coming hours to days.   10/20 - pt doing better, Hbg improved w transfusion, 8.4 this AM.  BP's improved, remain soft but stable MAP's consistently.  Pt awake, alert, interactive, denies complaints.     10/23-sitting up in a recliner.  Conversant.  Family at bedside   10/24 --tachycardic on the monitor but appears comfortable.  Denies having any pain, no shortness of breath or chest pain       Assessment and Plan:  Septic Shock - resolved.  Due to aspiration PNA. Suspect also hypovolemic component. Presented with hypotension requiring pressors and initial lactic acid 7.4.   Lactate normalized 10/19 to 1.1 Sepsis physiology resolved. Monitor fever curve, CBC, hemodynamics Maintain MAP > 60-65 Continue midodrine, wean as BP tolerates      Aspiration Pneumonia  in setting of fall and prolonged down time, underlying dementia but family deny signs of dysphagia.  Repeat CXR 10/19 - R base consolidation and small pleural effusion.  Procal 6.28 (up  insulin aspart 100 UNIT/ML injection Commonly known as: novoLOG Inject 0-9 Units into the skin 3 (three) times daily with meals.   levothyroxine 25 MCG tablet Commonly known as: SYNTHROID TAKE 1 TABLET BY MOUTH ONCE DAILY ON AN EMPTY STOMACH. WAIT 30 MINUTES BEFORE TAKING OTHER MEDS.   melatonin 5 MG Tabs Take 0.5 tablets (2.5 mg total) by mouth at bedtime as needed.   metoprolol succinate 25 MG 24 hr tablet Commonly known as: TOPROL-XL Take 1 tablet (25 mg total) by mouth daily.   midodrine 10 MG tablet Commonly known as: PROAMATINE Take 1 tablet (10 mg total) by mouth 3 (three) times daily with meals.   pantoprazole 20 MG tablet Commonly known as: PROTONIX Take 20 mg by mouth 2 (two) times daily before a meal.   polyethylene glycol 17 g packet Commonly known as: MIRALAX / GLYCOLAX Take 17 g by mouth daily.   Potassium 99 MG  Tabs Take 2 tablets (198 mg total) by mouth daily.   Prevagen Extra Strength 20 MG Caps Generic drug: Apoaequorin Take 20 mg by mouth in the morning.   senna-docusate 8.6-50 MG tablet Commonly known as: Senokot-S Take 1 tablet by mouth 2 (two) times daily.   Vitamin D 50 MCG (2000 UT) Caps Take 1 capsule (2,000 Units total) by mouth daily.        Follow-up Information     Alwyn Pea, MD. Go in 1 week(s).   Specialties: Cardiology, Internal Medicine Contact information: [address] [place] [address] [phone]         Anson Oregon, PA-C Follow up in 2 week(s).   Specialty: Physician Assistant Contact information: [address] [place] [address] [phone]                Discharge Exam: Filed Weights   02/13/23 0357 02/14/23 0653 02/15/23 0529  Weight: 61 kg 59 kg 58.7 kg    General exam: awake, alert, no acute distress HEENT: moist mucus membranes, hearing grossly normal  Respiratory system: CTAB, no wheezes, rales or rhonchi, normal respiratory effort. Cardiovascular system: normal S1/S2, RRR, no JVD, murmurs, rubs, gallops, no pedal edema.   Gastrointestinal system: soft, NT, ND Central nervous system: A&O x1. no gross focal neurologic deficits, normal speech Extremities: left lateral hip wound oozing  Skin: dry, intact, normal temperature, normal color Psychiatry: normal mood, congruent affect       Condition at discharge: stable  The results of significant diagnostics from this hospitalization (including imaging, microbiology, ancillary and laboratory) are listed below for reference.   Imaging Studies: ECHOCARDIOGRAM COMPLETE  Result Date: 02/09/2023    ECHOCARDIOGRAM REPORT   Patient Name:   Vanessa [name] Date of Exam: 02/08/2023 Medical Rec #:  [id number]        Height:       64.0 in Accession #:    [id number]       Weight:       131.6 lb Date of Birth:  July 04, 1934        BSA:          1.638 m  Patient Age:    88 years         BP:           84/58 mmHg Patient Gender: F                HR:           139 bpm. Exam Location:  [facility] Procedure: 2D

## 2023-02-18 ENCOUNTER — Non-Acute Institutional Stay (SKILLED_NURSING_FACILITY): Payer: Self-pay | Admitting: Student

## 2023-02-18 ENCOUNTER — Encounter: Payer: Self-pay | Admitting: Student

## 2023-02-18 DIAGNOSIS — J449 Chronic obstructive pulmonary disease, unspecified: Secondary | ICD-10-CM

## 2023-02-18 DIAGNOSIS — S7291XP Unspecified fracture of right femur, subsequent encounter for closed fracture with malunion: Secondary | ICD-10-CM

## 2023-02-18 DIAGNOSIS — I5030 Unspecified diastolic (congestive) heart failure: Secondary | ICD-10-CM

## 2023-02-18 DIAGNOSIS — E876 Hypokalemia: Secondary | ICD-10-CM

## 2023-02-18 DIAGNOSIS — E46 Unspecified protein-calorie malnutrition: Secondary | ICD-10-CM

## 2023-02-18 DIAGNOSIS — I5023 Acute on chronic systolic (congestive) heart failure: Secondary | ICD-10-CM

## 2023-02-18 DIAGNOSIS — F0390 Unspecified dementia without behavioral disturbance: Secondary | ICD-10-CM

## 2023-02-18 DIAGNOSIS — J690 Pneumonitis due to inhalation of food and vomit: Secondary | ICD-10-CM

## 2023-02-18 DIAGNOSIS — E1165 Type 2 diabetes mellitus with hyperglycemia: Secondary | ICD-10-CM

## 2023-02-18 DIAGNOSIS — R634 Abnormal weight loss: Secondary | ICD-10-CM

## 2023-02-18 DIAGNOSIS — N179 Acute kidney failure, unspecified: Secondary | ICD-10-CM

## 2023-02-18 NOTE — Progress Notes (Unsigned)
Provider:  Sydnee Cabal Location:  Other Nursing Home Room Number: 103 A Place of Service:  SNF (31)  PCP: Joaquim Nam, MD Patient Care Team: Joaquim Nam, MD as PCP - General (Family Medicine) Lockie Mola, MD as Referring Physician (Ophthalmology) Antonieta Iba, MD as Consulting Physician (Cardiology)  Extended Emergency Contact Information Primary Emergency Contact: Marlou Starks of [place] Home Phone: [phone] Relation: Daughter Secondary Emergency Contact: Rhetta Mura [place] of [place] Home Phone: [phone] Relation: Daughter  Code Status: DNR Goals of Care: Advanced Directive information    02/07/2023    8:12 AM  Advanced Directives  Does Patient Have a Medical Advance Directive? Unable to assess, patient is non-responsive or altered mental status      Chief Complaint  Patient presents with   Admission    HPI: Patient is a 88 y.o. female seen today for admission to [facility] after [employer]  They have folks who stay with her in the morning.   She fell in 2020 and fractured her pelvis. She had to do everything at home. She had help 24/7. Memory problems hppened. Had Uti. She had care at night and during the day. Was staying by herself at night before.   Sitter had just tucked her in 15 minutes before. She thought she was going to the bathroom. Hypothermia and pneumonia occurred. In the hospital for 8 days. CHF in the ICU and that her HR was high and then low.   Delirium was really bad in the ICU   She is sometimes cognisant about her surroundings she goes back and forth.   They want to return. No assistive devices. Numerous falls this year, but this is the first injurious falls. Had a UTI earlier this year.   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 She has 4 children. She has everything picked out.   Albin Felling is HCPOA. DNR  Hx of SVT  Past Medical History:  Diagnosis Date    Anemia    treated ~2009 with iron, resolved   Arthritis    Cataract    surgery on R catarct 2012   Chronic systolic CHF (congestive heart failure) (HCC)    a. TTE 2012: EF of 45-50%, mild diffuse HK, trivial AI, mild MR, mildly dilated RV with mild reduction of RVSF, mild to moderate TR, mild to moderate PR, mildly elevated PASP   COPD (chronic obstructive pulmonary disease) (HCC)    Family history of adverse reaction to anesthesia    Mother - altered mental status (long term)   Glaucoma    History of pelvic fracture    Hyperglycemia    mild inc in fasting sugar   Hyperlipidemia    Hypothyroidism    Insomnia    Migraines    without aura, sx since age 18   Osteopenia    prev with 10 years of fosamax and intolerant of evista   PAF (paroxysmal atrial fibrillation) (HCC)    a. initially noted 2012; b. CHADS2VASc at least 5 (CHF, age x 2, vasacular disease, female)   Pulmonary hypertension (HCC)    Wears dentures    full upper and lower   Past Surgical History:  Procedure Laterality Date   CATARACT EXTRACTION     CATARACT EXTRACTION W/PHACO Left 08/27/2016   Procedure: CATARACT EXTRACTION PHACO AND INTRAOCULAR LENS PLACEMENT (IOC)  Left;  Surgeon: Lockie Mola, MD;  [facility];  Service: Ophthalmology;  Laterality: Left;  COLONOSCOPY  2012   INTRAMEDULLARY (IM) NAIL INTERTROCHANTERIC Right 02/07/2023   Procedure: INTRAMEDULLARY (IM) NAIL INTERTROCHANTERIC;  Surgeon: Christena Flake, MD;  Location: [facility];  Service: Orthopedics;  Laterality: Right;   REFRACTIVE SURGERY      reports that she has quit smoking. Her smoking use included cigarettes. She has a 12.5 pack-year smoking history. She has never used smokeless tobacco. She reports that she does not drink alcohol and does not use drugs. Social History   Socioeconomic History   Marital status: Widowed    Spouse name: Not on file   Number of children: Not on file   Years of education: Not on file    Highest education level: Some college, no degree  Occupational History   Not on file  Tobacco Use   Smoking status: Former    Current packs/day: 0.25    Average packs/day: 0.3 packs/day for 50.0 years (12.5 ttl pk-yrs)    Types: Cigarettes   Smokeless tobacco: Never   Tobacco comments:    started smoking in 1958- stopped 2020 after a fall  Vaping Use   Vaping status: Never Used  Substance and Sexual Activity   Alcohol use: No   Drug use: No   Sexual activity: Never  Other Topics Concern   Not on file  Social History Narrative   From [place].     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  Was married for 47 years.     Active in church and senior groups.     Likes gospel singing.     Lives alone (with her dog Kirt Boys)   4 kids, all are nearby.     Social Determinants of Health   Financial Resource Strain: Low Risk  (08/10/2022)   Overall Financial Resource Strain (CARDIA)    Difficulty of Paying Living Expenses: Not very hard  Food Insecurity: No Food Insecurity (02/08/2023)   Hunger Vital Sign    Worried About Running Out of Food in the Last Year: Never true    Ran Out of Food in the Last Year: Never true  Transportation Needs: No Transportation Needs (02/08/2023)   PRAPARE - Administrator, Civil Service (Medical): No    Lack of Transportation (Non-Medical): No  Physical Activity: Inactive (08/10/2022)   Exercise Vital Sign    Days of Exercise per Week: 0 days    Minutes of Exercise per Session: 0 min  Stress: No Stress Concern Present (08/10/2022)   [employer] of Occupational Health - Occupational Stress Questionnaire    Feeling of Stress : Not at all  Social Connections: Unknown (08/10/2022)   Social Connection and Isolation Panel [NHANES]    Frequency of Communication with Friends and Family: Patient declined    Frequency of Social Gatherings with Friends and Family: More than three times a week    Attends Religious Services: Patient declined     Database administrator or Organizations: No    Attends Banker Meetings: Never    Marital Status: Widowed  Recent Concern: Social Connections - Moderately Isolated (06/13/2022)   Social Connection and Isolation Panel [NHANES]    Frequency of Communication with Friends and Family: More than three times a week    Frequency of Social Gatherings with Friends and Family: More than three times a week    Attends Religious Services: More than 4 times per year    Active Member of Clubs or Organizations: No    Attends  Club or Organization Meetings: Never    Marital Status: Widowed  Intimate Partner Violence: Not At Risk (02/08/2023)   Humiliation, Afraid, Rape, and Kick questionnaire    Fear of Current or Ex-Partner: No    Emotionally Abused: No    Physically Abused: No    Sexually Abused: No    Functional Status Survey:    Family History  Problem Relation Age of Onset   Diabetes Father    Migraines Mother    Stroke Mother        TIAs   Breast cancer Neg Hx    Colon cancer Neg Hx     Health Maintenance  Topic Date Due   FOOT EXAM  Never done   OPHTHALMOLOGY EXAM  Never done   Zoster Vaccines- Shingrix (1 of 2) Never done   INFLUENZA VACCINE  11/22/2022   COVID-19 Vaccine (4 - 2023-24 season) 12/23/2022   MAMMOGRAM  07/10/2025 (Originally 08/13/2014)   Medicare Annual Wellness (AWV)  06/14/2023   HEMOGLOBIN A1C  08/08/2023   DTaP/Tdap/Td (3 - Td or Tdap) 02/06/2033   Pneumonia Vaccine 47+ Years old  Completed   DEXA SCAN  Completed   HPV VACCINES  Aged Out    Allergies  Allergen Reactions   Codeine     Doesn't remember reaction   Cortisone     Doesn't remember reaction   Decongestant [Pseudoephedrine Hcl] Other (See Comments)    Avoid decongestants due to glaucoma hx.     Donepezil     Prolonged Qt   Naproxen     Stomach issues   Simvastatin     aches    Outpatient Encounter Medications as of 02/18/2023  Medication Sig   Melatonin-Pyridoxine 5-1 MG  TABS Take by mouth.   acetaminophen (TYLENOL) 325 MG tablet Take 1-2 tablets (325-650 mg total) by mouth every 6 (six) hours as needed for mild pain (pain score 1-3) (pain score 1-3 or temp > 100.5).   Apoaequorin (PREVAGEN EXTRA STRENGTH) 20 MG CAPS Take 20 mg by mouth in the morning.   aspirin EC 81 MG tablet Take 81 mg by mouth daily.   Cholecalciferol (VITAMIN D) 50 MCG (2000 UT) CAPS Take 1 capsule (2,000 Units total) by mouth daily.   Cranberry 1000 MG CAPS Take by mouth daily.   enoxaparin (LOVENOX) 40 MG/0.4ML injection Inject 0.4 mLs (40 mg total) into the skin at bedtime for 14 days.   feeding supplement (ENSURE ENLIVE / ENSURE PLUS) LIQD Take 237 mLs by mouth 2 (two) times daily between meals.   furosemide (LASIX) 20 MG tablet Take 1 lasix a day.  But increase 2 tabs of lasix if AM weight is above 118 lbs.   insulin aspart (NOVOLOG) 100 UNIT/ML injection Inject 0-9 Units into the skin 3 (three) times daily with meals.   levothyroxine (SYNTHROID) 25 MCG tablet TAKE 1 TABLET BY MOUTH ONCE DAILY ON AN EMPTY STOMACH. WAIT 30 MINUTES BEFORE TAKING OTHER MEDS.   melatonin 5 MG TABS Take 0.5 tablets (2.5 mg total) by mouth at bedtime as needed.   metoprolol succinate (TOPROL-XL) 25 MG 24 hr tablet Take 1 tablet (25 mg total) by mouth daily.   midodrine (PROAMATINE) 10 MG tablet Take 1 tablet (10 mg total) by mouth 3 (three) times daily with meals.   pantoprazole (PROTONIX) 20 MG tablet Take 20 mg by mouth 2 (two) times daily before a meal.   polyethylene glycol (MIRALAX / GLYCOLAX) 17 g packet Take 17 g by mouth daily.  Potassium 99 MG TABS Take 2 tablets (198 mg total) by mouth daily.   senna-docusate (SENOKOT-S) 8.6-50 MG tablet Take 1 tablet by mouth 2 (two) times daily.   [EXPIRED] traMADol (ULTRAM) 50 MG tablet Take 1 tablet (50 mg total) by mouth every 6 (six) hours as needed for up to 5 days for moderate pain (pain score 4-6).   No facility-administered encounter medications on file  as of 02/18/2023.    Review of Systems  Vitals:   02/18/23 0810  BP: 117/76  Pulse: 67  Resp: 18  Temp: 98.2 F (36.8 C)  SpO2: 93%  Weight: 127 lb 9.6 oz (57.9 kg)   Body mass index is 21.9 kg/m. Physical Exam Constitutional:      Appearance: Normal appearance.  Cardiovascular:     Rate and Rhythm: Normal rate.     Pulses: Normal pulses.  Pulmonary:     Effort: Pulmonary effort is normal.     Breath sounds: Normal breath sounds.  Abdominal:     General: Abdomen is flat.     Palpations: Abdomen is soft.  Skin:    General: Skin is warm.     Comments: Wound vac in place, poor suction, minimal drainage. Sacrum clean, dry, intact skin  Neurological:     Mental Status: She is alert. Mental status is at baseline. She is disoriented.     Labs reviewed: Basic Metabolic Panel: Recent Labs    02/08/23 0455 02/09/23 0631 02/10/23 0656 02/11/23 0352 02/12/23 0349 02/13/23 0932 02/15/23 0326  NA 140 137   < > 140 138 137 135  K 4.2 4.6   < > 3.4* 3.8 3.8 3.3*  CL 110 108   < > 104 102 100 97*  CO2 22 22   < > 27 27 26 28   GLUCOSE 96 149*   < > 86 99 162* 125*  BUN 21 27*   < > 18 16 16 15   CREATININE 1.08* 1.19*   < > 0.60 0.56 0.71 0.66  CALCIUM 8.2* 8.3*   < > 8.0* 8.2* 8.8* 8.1*  MG 1.7 2.3  --  1.9 2.0  --   --   PHOS 4.1 4.4  --   --  2.8  --   --    < > = values in this interval not displayed.   Liver Function Tests: Recent Labs    08/04/22 0502 02/07/23 0814 02/10/23 0656  AST 19 37 47*  ALT 11 17 7   ALKPHOS 57 37* 37*  BILITOT 1.0 0.7 0.9  PROT 6.0* 5.0* 4.9*  ALBUMIN 3.3* 2.8* 2.6*   Recent Labs    02/07/23 0814  LIPASE 28   No results for input(s): "AMMONIA" in the last 8760 hours. CBC: Recent Labs    08/10/22 1602 11/26/22 1327 02/07/23 0814 02/07/23 1424 02/13/23 0348 02/14/23 0452 02/15/23 0326  WBC 10.2 6.6 20.2*   < > 12.4* 14.3* 11.9*  NEUTROABS 7,375 4.5 17.1*  --   --   --   --   HGB 14.5 13.1 10.0*   < > 10.2* 10.7* 9.8*   HCT 43.8 41.0 31.1*   < > 31.5* 32.3* 29.6*  MCV 90.5 93.8 93.4   < > 91.8 92.6 92.5  PLT 321 320.0 208   < > 219 282 330   < > = values in this interval not displayed.   Cardiac Enzymes: Recent Labs    02/07/23 0814  CKTOTAL 336*   BNP: Invalid input(s): "POCBNP" Lab  Results  Component Value Date   HGBA1C 7.7 (H) 02/07/2023   Lab Results  Component Value Date   TSH 3.984 02/07/2023   Lab Results  Component Value Date   VITAMINB12 448 05/08/2022   No results found for: "FOLATE" No results found for: "IRON", "TIBC", "FERRITIN"  Imaging and Procedures obtained prior to SNF admission: ECHOCARDIOGRAM COMPLETE  Result Date: 02/09/2023    ECHOCARDIOGRAM REPORT   Patient Name:   CHATHAM BREA Date of Exam: 02/08/2023 Medical Rec #:  [id number]        Height:       64.0 in Accession #:    [id number]       Weight:       131.6 lb Date of Birth:  28-Mar-1935        BSA:          1.638 m Patient Age:    88 years         BP:           84/58 mmHg Patient Gender: F                HR:           139 bpm. Exam Location:  [facility] Procedure: 2D Echo, Cardiac Doppler and Color Doppler Indications:     Abnormal ECG R94.31  History:         Patient has prior history of Echocardiogram examinations, most                  recent 10/11/2017. Pulmonary HTN; Risk Factors:Dyslipidemia.                  Paroxysmal Afib.  Sonographer:     Cristela Blue Referring Phys:  [id number] [facility] Diagnosing Phys: Rozell Searing Custovic  Sonographer Comments: Technically challenging study due to limited acoustic windows and no apical window. IMPRESSIONS  1. Left ventricular ejection fraction, by estimation, is 45 to 50%. Left ventricular ejection fraction by PLAX is 49 %. The left ventricle has mildly decreased function. The left ventricle demonstrates global hypokinesis. Left ventricular diastolic parameters are indeterminate.  2. Right ventricular systolic function is normal. The right ventricular size is normal. There is normal  pulmonary artery systolic pressure. The estimated right ventricular systolic pressure is 34.8 mmHg.  3. The mitral valve is grossly normal. Trivial mitral valve regurgitation.  4. The aortic valve is grossly normal. Aortic valve regurgitation is not visualized. Aortic valve sclerosis/calcification is present, without any evidence of aortic stenosis. FINDINGS  Left Ventricle: Left ventricular ejection fraction, by estimation, is 45 to 50%. Left ventricular ejection fraction by PLAX is 49 %. The left ventricle has mildly decreased function. The left ventricle demonstrates global hypokinesis. The left ventricular internal cavity size was normal in size. There is no left ventricular hypertrophy. Left ventricular diastolic parameters are indeterminate. Right Ventricle: The right ventricular size is normal. No increase in right ventricular wall thickness. Right ventricular systolic function is normal. There is normal pulmonary artery systolic pressure. The tricuspid regurgitant velocity is 2.82 m/s, and  with an assumed right atrial pressure of 3 mmHg, the estimated right ventricular systolic pressure is 34.8 mmHg. Left Atrium: Left atrial size was normal in size. Right Atrium: Right atrial size was normal in size. Pericardium: There is no evidence of pericardial effusion. Mitral Valve: The mitral valve is grossly normal. Trivial mitral valve regurgitation. Tricuspid Valve: The tricuspid valve is grossly normal. Tricuspid valve regurgitation is mild. Aortic Valve: The aortic valve  is grossly normal. Aortic valve regurgitation is not visualized. Aortic valve sclerosis/calcification is present, without any evidence of aortic stenosis. Pulmonic Valve: The pulmonic valve was grossly normal. Pulmonic valve regurgitation is not visualized. Aorta: The aortic root is normal in size and structure. IAS/Shunts: No atrial level shunt detected by color flow Doppler.  LEFT VENTRICLE PLAX 2D LV EF:         Left ventricular ejection  fraction by PLAX is 49 %. LVIDd:         3.30 cm LVIDs:         2.50 cm LV PW:         0.80 cm LV IVS:        1.10 cm LVOT diam:     2.10 cm LVOT Area:     3.46 cm  LEFT ATRIUM         Index LA diam:    3.80 cm 2.32 cm/m   AORTA Ao Root diam: 2.60 cm TRICUSPID VALVE TR Peak grad:   31.8 mmHg TR Vmax:        282.00 cm/s  SHUNTS Systemic Diam: 2.10 cm Rozell Searing Custovic Electronically signed by Clotilde Dieter Signature Date/Time: 02/09/2023/9:57:22 AM    Final    DG Chest 1 View  Result Date: 02/09/2023 CLINICAL DATA:  88 year old female with history of shortness of breath. EXAM: CHEST  1 VIEW COMPARISON:  Chest x-ray 02/07/2023. FINDINGS: Lung volumes are low. Patient is severely rotated to the right which causes distortion of cardiomediastinal structures limiting today's examination. With these limitations in mind, there is an ill-defined opacity in the right base which may reflect atelectasis and/or consolidation, with superimposed small right pleural effusion. Minimal scarring in the periphery of the left lung base. Left lung otherwise appears clear. No left pleural effusion. No pneumothorax. No evidence of pulmonary edema. Heart size is likely mildly enlarged. Mediastinal contours are distorted. IMPRESSION: 1. Atelectasis and/or consolidation in the right lung base with small right pleural effusion. 2. Mild cardiomegaly. Electronically Signed   By: Trudie Reed M.D.   On: 02/09/2023 06:29    Assessment/Plan Closed fracture of right femur with malunion, unspecified fracture morphology, unspecified portion of femur, subsequent encounter  Protein-calorie malnutrition, unspecified severity (HCC)  Acute on chronic systolic CHF (congestive heart failure) (HCC)  Type 2 diabetes mellitus with hyperglycemia, unspecified whether long term insulin use (HCC)  Dementia, unspecified dementia severity, unspecified dementia type, unspecified whether behavioral, psychotic, or mood disturbance or anxiety  (HCC)  Aspiration pneumonia, unspecified aspiration pneumonia type, unspecified laterality, unspecified part of lung (HCC)  AKI (acute kidney injury) (HCC)  Hypokalemia  Chronic obstructive pulmonary disease, unspecified COPD type (HCC)  Diastolic CHF with preserved left ventricular function, NYHA class 2 (HCC) Patient admitted to facility after ground-level fall outside of her home.  Patient with history of dementia which has progressed over the years.  She requires 24-hour care for the last 2 4 years.  Ambulatory with assistive device, disoriented at baseline, does not manage her medications.  She is a fast level 6A at this time.  Status post ORIF of the right lower extremity.  Minimal drainage, will discontinue wound VAC at this time.  Continue physical therapy.  Patient with history of weight loss and poor p.o. intake requiring reminding to eat by family and sitters, continue protein supplementation as tolerated.  History of diabetes well-controlled without medication continue to monitor.  Will discontinue daily glucose checks given normal A1c.  History of aspiration pneumonia status posttreatment currently  without oxygen and improving.  Continued dysphagia, speech therapy on admission.  Status post treatment with Unasyn continue Augmentin for completion of treatment.  Encourage physical activity and movement.  AKI will recheck BMP.  Hypokalemia recheck BMP.  History of COPD lungs clear at this time  Septic shock noted during hospitalization, patient currently on Lasix metoprolol and midodrine.  Will discontinue Lasix at this time given low blood pressures.  Continue midodrine.  Continue 3 times daily blood pressure checks.   Family/ staff Communication: Nursing  Labs/tests ordered: CBC, BMP  I spent greater than 55 minutes for the care of this patient in face to face time, chart review, clinical documentation, patient education.

## 2023-02-21 ENCOUNTER — Encounter: Payer: Self-pay | Admitting: Student

## 2023-02-21 DIAGNOSIS — I5030 Unspecified diastolic (congestive) heart failure: Secondary | ICD-10-CM | POA: Insufficient documentation

## 2023-02-21 DIAGNOSIS — I5042 Chronic combined systolic (congestive) and diastolic (congestive) heart failure: Secondary | ICD-10-CM | POA: Diagnosis not present

## 2023-02-21 DIAGNOSIS — I471 Supraventricular tachycardia, unspecified: Secondary | ICD-10-CM | POA: Diagnosis not present

## 2023-02-21 DIAGNOSIS — E0965 Drug or chemical induced diabetes mellitus with hyperglycemia: Secondary | ICD-10-CM | POA: Diagnosis not present

## 2023-02-21 DIAGNOSIS — Z8709 Personal history of other diseases of the respiratory system: Secondary | ICD-10-CM | POA: Diagnosis not present

## 2023-02-21 DIAGNOSIS — R0602 Shortness of breath: Secondary | ICD-10-CM | POA: Diagnosis not present

## 2023-02-21 DIAGNOSIS — E876 Hypokalemia: Secondary | ICD-10-CM | POA: Diagnosis not present

## 2023-02-21 DIAGNOSIS — Z87898 Personal history of other specified conditions: Secondary | ICD-10-CM | POA: Diagnosis not present

## 2023-02-21 DIAGNOSIS — R42 Dizziness and giddiness: Secondary | ICD-10-CM | POA: Diagnosis not present

## 2023-02-21 DIAGNOSIS — J449 Chronic obstructive pulmonary disease, unspecified: Secondary | ICD-10-CM | POA: Diagnosis not present

## 2023-02-21 DIAGNOSIS — J441 Chronic obstructive pulmonary disease with (acute) exacerbation: Secondary | ICD-10-CM | POA: Diagnosis not present

## 2023-02-21 DIAGNOSIS — E785 Hyperlipidemia, unspecified: Secondary | ICD-10-CM | POA: Diagnosis not present

## 2023-02-21 LAB — BASIC METABOLIC PANEL
BUN: 12 (ref 4–21)
CO2: 32 — AB (ref 13–22)
Chloride: 97 — AB (ref 99–108)
Creatinine: 0.7 (ref 0.5–1.1)
Glucose: 95
Potassium: 3.7 meq/L (ref 3.5–5.1)
Sodium: 142 (ref 137–147)

## 2023-02-21 LAB — CBC AND DIFFERENTIAL
HCT: 34 — AB (ref 36–46)
Hemoglobin: 10.8 — AB (ref 12.0–16.0)
Neutrophils Absolute: 6642
Platelets: 526 10*3/uL — AB (ref 150–400)
WBC: 9

## 2023-02-21 LAB — COMPREHENSIVE METABOLIC PANEL
Calcium: 8.5 — AB (ref 8.7–10.7)
eGFR: 80

## 2023-02-21 LAB — CBC: RBC: 3.55 — AB (ref 3.87–5.11)

## 2023-03-19 DIAGNOSIS — E876 Hypokalemia: Secondary | ICD-10-CM | POA: Diagnosis not present

## 2023-03-19 DIAGNOSIS — Z87898 Personal history of other specified conditions: Secondary | ICD-10-CM | POA: Diagnosis not present

## 2023-03-19 DIAGNOSIS — Z8709 Personal history of other diseases of the respiratory system: Secondary | ICD-10-CM | POA: Diagnosis not present

## 2023-03-19 DIAGNOSIS — E0965 Drug or chemical induced diabetes mellitus with hyperglycemia: Secondary | ICD-10-CM | POA: Diagnosis not present

## 2023-03-19 DIAGNOSIS — J441 Chronic obstructive pulmonary disease with (acute) exacerbation: Secondary | ICD-10-CM | POA: Diagnosis not present

## 2023-03-19 DIAGNOSIS — R0602 Shortness of breath: Secondary | ICD-10-CM | POA: Diagnosis not present

## 2023-03-19 DIAGNOSIS — I5042 Chronic combined systolic (congestive) and diastolic (congestive) heart failure: Secondary | ICD-10-CM | POA: Diagnosis not present

## 2023-03-19 DIAGNOSIS — I471 Supraventricular tachycardia, unspecified: Secondary | ICD-10-CM | POA: Diagnosis not present

## 2023-03-19 DIAGNOSIS — E785 Hyperlipidemia, unspecified: Secondary | ICD-10-CM | POA: Diagnosis not present

## 2023-03-19 DIAGNOSIS — R42 Dizziness and giddiness: Secondary | ICD-10-CM | POA: Diagnosis not present

## 2023-03-19 DIAGNOSIS — J449 Chronic obstructive pulmonary disease, unspecified: Secondary | ICD-10-CM | POA: Diagnosis not present

## 2023-03-20 ENCOUNTER — Encounter: Payer: Self-pay | Admitting: Student

## 2023-03-20 ENCOUNTER — Non-Acute Institutional Stay (SKILLED_NURSING_FACILITY): Payer: PPO | Admitting: Student

## 2023-03-20 DIAGNOSIS — E46 Unspecified protein-calorie malnutrition: Secondary | ICD-10-CM | POA: Diagnosis not present

## 2023-03-20 DIAGNOSIS — S7291XP Unspecified fracture of right femur, subsequent encounter for closed fracture with malunion: Secondary | ICD-10-CM

## 2023-03-20 DIAGNOSIS — E1165 Type 2 diabetes mellitus with hyperglycemia: Secondary | ICD-10-CM

## 2023-03-20 DIAGNOSIS — J449 Chronic obstructive pulmonary disease, unspecified: Secondary | ICD-10-CM | POA: Diagnosis not present

## 2023-03-20 DIAGNOSIS — I5030 Unspecified diastolic (congestive) heart failure: Secondary | ICD-10-CM

## 2023-03-20 DIAGNOSIS — F0390 Unspecified dementia without behavioral disturbance: Secondary | ICD-10-CM

## 2023-03-20 DIAGNOSIS — Z66 Do not resuscitate: Secondary | ICD-10-CM | POA: Diagnosis not present

## 2023-03-20 NOTE — Progress Notes (Unsigned)
Location:  Other ([facility] Room Number: 103A Place of Service:  SNF [phone]) Provider:  Katharina Jehle,MD  Joaquim Nam, MD  Patient Care Team: Joaquim Nam, MD as PCP - General (Family Medicine) Lockie Mola, MD as Referring Physician (Ophthalmology) Antonieta Iba, MD as Consulting Physician (Cardiology)  Extended Emergency Contact Information Primary Emergency Contact: Marlou Starks of [place] Home Phone: [phone] Relation: Daughter Secondary Emergency Contact: Rhetta Mura [place] of [place] Home Phone: [phone] Relation: Daughter  Code Status:  DNR Goals of care: Advanced Directive information    03/20/2023    9:19 AM  Advanced Directives  Does Patient Have a Medical Advance Directive? Yes  Type of Advance Directive Living will  Does patient want to make changes to medical advance directive? No - Patient declined     Chief Complaint  Patient presents with   Medical Management of Chronic Issues    Routine visit and discuss foot exam,eye exam,shingles vaccine.    HPI:  Pt is a 88 y.o. female seen today for medical management of chronic diseases.  Patient is trying to get out of her chair without assistance. She asks why she can't go to the bathroom or go home. Discussed she is in a facility after a fall. She states, "Oh, I fell?" Well do my children know? Do they know where I am? Reminded patient where she is at this time and why she is here She endorses understanding and calms down.   Patient with weight loss since admission to facility. Protein supplementation initiated. Requires coaching during meals.    Past Medical History:  Diagnosis Date   Anemia    treated ~2009 with iron, resolved   Arthritis    Cataract    surgery on R catarct 2012   Chronic systolic CHF (congestive heart failure) (HCC)    a. TTE 2012: EF of 45-50%, mild diffuse HK, trivial AI, mild MR, mildly dilated RV with mild  reduction of RVSF, mild to moderate TR, mild to moderate PR, mildly elevated PASP   COPD (chronic obstructive pulmonary disease) (HCC)    Family history of adverse reaction to anesthesia    Mother - altered mental status (long term)   Glaucoma    History of pelvic fracture    Hyperglycemia    mild inc in fasting sugar   Hyperlipidemia    Hypothyroidism    Insomnia    Migraines    without aura, sx since age 85   Osteopenia    prev with 10 years of fosamax and intolerant of evista   PAF (paroxysmal atrial fibrillation) (HCC)    a. initially noted 2012; b. CHADS2VASc at least 5 (CHF, age x 2, vasacular disease, female)   Pulmonary hypertension (HCC)    Wears dentures    full upper and lower   Past Surgical History:  Procedure Laterality Date   CATARACT EXTRACTION     CATARACT EXTRACTION W/PHACO Left 08/27/2016   Procedure: CATARACT EXTRACTION PHACO AND INTRAOCULAR LENS PLACEMENT (IOC)  Left;  Surgeon: Lockie Mola, MD;  Location: [facility];  Service: Ophthalmology;  Laterality: Left;   COLONOSCOPY  2012   INTRAMEDULLARY (IM) NAIL INTERTROCHANTERIC Right 02/07/2023   Procedure: INTRAMEDULLARY (IM) NAIL INTERTROCHANTERIC;  Surgeon: Christena Flake, MD;  Location: [facility];  Service: Orthopedics;  Laterality: Right;   REFRACTIVE SURGERY      Allergies  Allergen Reactions   Codeine     Doesn't remember reaction   Cortisone  Doesn't remember reaction   Decongestant [Pseudoephedrine Hcl] Other (See Comments)    Avoid decongestants due to glaucoma hx.     Donepezil     Prolonged Qt   Naproxen     Stomach issues   Simvastatin     aches    Outpatient Encounter Medications as of 03/20/2023  Medication Sig   acetaminophen (TYLENOL) 325 MG tablet Take 1-2 tablets (325-650 mg total) by mouth every 6 (six) hours as needed for mild pain (pain score 1-3) (pain score 1-3 or temp > 100.5).   Apoaequorin (PREVAGEN EXTRA STRENGTH) 20 MG CAPS Take 20 mg by mouth in the  morning.   aspirin EC 81 MG tablet Take 81 mg by mouth daily.   Cholecalciferol (VITAMIN D) 50 MCG (2000 UT) CAPS Take 1 capsule (2,000 Units total) by mouth daily.   Cranberry 1000 MG CAPS Take by mouth daily.   feeding supplement (ENSURE ENLIVE / ENSURE PLUS) LIQD Take 237 mLs by mouth 2 (two) times daily between meals.   levothyroxine (SYNTHROID) 25 MCG tablet TAKE 1 TABLET BY MOUTH ONCE DAILY ON AN EMPTY STOMACH. WAIT 30 MINUTES BEFORE TAKING OTHER MEDS.   metoprolol succinate (TOPROL-XL) 25 MG 24 hr tablet Take 1 tablet (25 mg total) by mouth daily.   pantoprazole (PROTONIX) 20 MG tablet Take 20 mg by mouth 2 (two) times daily before a meal.   polyethylene glycol (MIRALAX / GLYCOLAX) 17 g packet Take 17 g by mouth daily.   Potassium 99 MG TABS Take 2 tablets (198 mg total) by mouth daily.   enoxaparin (LOVENOX) 40 MG/0.4ML injection Inject 0.4 mLs (40 mg total) into the skin at bedtime for 14 days. (Patient not taking: Reported on 03/20/2023)   furosemide (LASIX) 20 MG tablet Take 1 lasix a day.  But increase 2 tabs of lasix if AM weight is above 118 lbs. (Patient not taking: Reported on 03/20/2023)   insulin aspart (NOVOLOG) 100 UNIT/ML injection Inject 0-9 Units into the skin 3 (three) times daily with meals. (Patient not taking: Reported on 03/20/2023)   No facility-administered encounter medications on file as of 03/20/2023.    Review of Systems  Immunization History  Administered Date(s) Administered   Influenza Inj Mdck Quad Pf 01/28/2019   Influenza Split 01/25/2011, 01/24/2012, 01/21/2013   Influenza, High Dose Seasonal PF 03/15/2021, 01/08/2022, 03/07/2023   Influenza,inj,Quad PF,6+ Mos 01/14/2017   Influenza-Unspecified 01/20/2014, 01/19/2016, 01/17/2018, 01/22/2020   Moderna Sars-Covid-2 Vaccination 05/06/2019, 06/03/2019, 03/07/2020   PNEUMOCOCCAL CONJUGATE-20 03/11/2023   Pneumococcal Conjugate-13 05/24/2014   Pneumococcal Polysaccharide-23 05/14/2011   Respiratory  Syncytial Virus Vaccine,Recomb Aduvanted(Arexvy) 03/11/2023   Tdap 01/07/2012, 02/07/2023   Pertinent  Health Maintenance Due  Topic Date Due   FOOT EXAM  Never done   OPHTHALMOLOGY EXAM  Never done   MAMMOGRAM  07/10/2025 (Originally 08/13/2014)   HEMOGLOBIN A1C  08/08/2023   INFLUENZA VACCINE  Completed   DEXA SCAN  Completed      05/29/2021    3:14 PM 02/15/2022    1:21 PM 05/08/2022   12:30 PM 06/13/2022    3:40 PM 08/10/2022    3:02 PM  Fall Risk  Falls in the past year?   1 0 1  Was there an injury with Fall?   0 0 1  Fall Risk Category Calculator   2 0 3  (RETIRED) Patient Fall Risk Level Low fall risk Low fall risk     Patient at Risk for Falls Due to   History of  fall(s);Impaired balance/gait No Fall Risks Impaired balance/gait;Impaired mobility;History of fall(s)  Fall risk Follow up   Falls evaluation completed Education provided;Falls prevention discussed Falls evaluation completed;Falls prevention discussed   Functional Status Survey:    Vitals:   03/20/23 0912  BP: 109/73  Pulse: (!) 103  Resp: 20  Temp: 97.8 F (36.6 C)  SpO2: 91%  Weight: 115 lb 9.6 oz (52.4 kg)  Height: 5\' 4"  (1.626 m)   Body mass index is 19.84 kg/m. Physical Exam Constitutional:      Comments: Thin, Frail  Cardiovascular:     Rate and Rhythm: Normal rate.     Pulses: Normal pulses.  Pulmonary:     Effort: Pulmonary effort is normal.  Abdominal:     General: Abdomen is flat.     Palpations: Abdomen is soft.  Neurological:     Mental Status: She is alert. Mental status is at baseline. She is disoriented.     Labs reviewed: Recent Labs    02/08/23 0455 02/09/23 0631 02/10/23 0656 02/11/23 0352 02/12/23 0349 02/13/23 0932 02/15/23 0326  NA 140 137   < > 140 138 137 135  K 4.2 4.6   < > 3.4* 3.8 3.8 3.3*  CL 110 108   < > 104 102 100 97*  CO2 22 22   < > 27 27 26 28   GLUCOSE 96 149*   < > 86 99 162* 125*  BUN 21 27*   < > 18 16 16 15   CREATININE 1.08* 1.19*   < > 0.60  0.56 0.71 0.66  CALCIUM 8.2* 8.3*   < > 8.0* 8.2* 8.8* 8.1*  MG 1.7 2.3  --  1.9 2.0  --   --   PHOS 4.1 4.4  --   --  2.8  --   --    < > = values in this interval not displayed.   Recent Labs    08/04/22 0502 02/07/23 0814 02/10/23 0656  AST 19 37 47*  ALT 11 17 7   ALKPHOS 57 37* 37*  BILITOT 1.0 0.7 0.9  PROT 6.0* 5.0* 4.9*  ALBUMIN 3.3* 2.8* 2.6*   Recent Labs    08/10/22 1602 11/26/22 1327 02/07/23 0814 02/07/23 1424 02/13/23 0348 02/14/23 0452 02/15/23 0326  WBC 10.2 6.6 20.2*   < > 12.4* 14.3* 11.9*  NEUTROABS 7,375 4.5 17.1*  --   --   --   --   HGB 14.5 13.1 10.0*   < > 10.2* 10.7* 9.8*  HCT 43.8 41.0 31.1*   < > 31.5* 32.3* 29.6*  MCV 90.5 93.8 93.4   < > 91.8 92.6 92.5  PLT 321 320.0 208   < > 219 282 330   < > = values in this interval not displayed.   Lab Results  Component Value Date   TSH 3.984 02/07/2023   Lab Results  Component Value Date   HGBA1C 7.7 (H) 02/07/2023   Lab Results  Component Value Date   CHOL 226 (H) 05/08/2022   HDL 62.30 05/08/2022   LDLCALC 137 (H) 05/08/2022   TRIG 133.0 05/08/2022   CHOLHDL 4 05/08/2022    Significant Diagnostic Results in last 30 days:  No results found.  Assessment/Plan DNR (do not resuscitate) - Plan: Do not attempt resuscitation (DNR)  Closed fracture of right femur with malunion, unspecified fracture morphology, unspecified portion of femur, subsequent encounter  Protein-calorie malnutrition, unspecified severity (HCC)  Acute on chronic systolic CHF (congestive heart failure) (HCC)  Type 2 diabetes mellitus with hyperglycemia, unspecified whether long term insulin use (HCC)  Dementia, unspecified dementia severity, unspecified dementia type, unspecified whether behavioral, psychotic, or mood disturbance or anxiety (HCC)  Diastolic CHF with preserved left ventricular function, NYHA class 2 (HCC)  Chronic obstructive pulmonary disease, unspecified COPD type (HCC) Patient with admission for  rehabilitation after a fall with fracture on 10/17. Due to patient's dementia, there has been slow progress with physical therapy. Of note, patient had fall and was found down outside of her home. Requires 24/7 supervision. Continued weight loss despite protein supplementation and coaching, continue goals of care conversations. Appears euvolemic on exam. Most recent A1c 7.7, diet controlled. Continue SSI. Breathing comfortably on RA.   Family/ staff Communication: nursing, contact daughters for GOC conversations.   Labs/tests ordered:  none

## 2023-03-21 ENCOUNTER — Encounter: Payer: Self-pay | Admitting: Student

## 2023-03-27 DIAGNOSIS — S72141D Displaced intertrochanteric fracture of right femur, subsequent encounter for closed fracture with routine healing: Secondary | ICD-10-CM | POA: Diagnosis not present

## 2023-03-27 LAB — BASIC METABOLIC PANEL
BUN: 26 — AB (ref 4–21)
CO2: 37 — AB (ref 13–22)
Chloride: 97 — AB (ref 99–108)
Creatinine: 1.2 — AB (ref 0.5–1.1)
Glucose: 134
Potassium: 4.3 meq/L (ref 3.5–5.1)
Sodium: 139 (ref 137–147)

## 2023-03-27 LAB — COMPREHENSIVE METABOLIC PANEL
Calcium: 9.5 (ref 8.7–10.7)
eGFR: 43

## 2023-04-11 DIAGNOSIS — R300 Dysuria: Secondary | ICD-10-CM | POA: Diagnosis not present

## 2023-04-11 DIAGNOSIS — D649 Anemia, unspecified: Secondary | ICD-10-CM | POA: Diagnosis not present

## 2023-04-11 LAB — CBC: RBC: 4.25 (ref 3.87–5.11)

## 2023-04-11 LAB — BASIC METABOLIC PANEL
BUN: 17 (ref 4–21)
CO2: 31 — AB (ref 13–22)
Chloride: 96 — AB (ref 99–108)
Creatinine: 1 (ref 0.5–1.1)
Glucose: 195
Potassium: 4 meq/L (ref 3.5–5.1)
Sodium: 139 (ref 137–147)

## 2023-04-11 LAB — CBC AND DIFFERENTIAL
HCT: 40 (ref 36–46)
Hemoglobin: 13.2 (ref 12.0–16.0)
Neutrophils Absolute: 8226
Platelets: 334 10*3/uL (ref 150–400)
WBC: 10.6

## 2023-04-11 LAB — COMPREHENSIVE METABOLIC PANEL
Calcium: 9 (ref 8.7–10.7)
eGFR: 54

## 2023-04-18 ENCOUNTER — Encounter: Payer: Self-pay | Admitting: Nurse Practitioner

## 2023-04-18 ENCOUNTER — Non-Acute Institutional Stay (SKILLED_NURSING_FACILITY): Payer: Self-pay | Admitting: Nurse Practitioner

## 2023-04-18 DIAGNOSIS — J449 Chronic obstructive pulmonary disease, unspecified: Secondary | ICD-10-CM | POA: Diagnosis not present

## 2023-04-18 DIAGNOSIS — F0390 Unspecified dementia without behavioral disturbance: Secondary | ICD-10-CM | POA: Diagnosis not present

## 2023-04-18 DIAGNOSIS — I5030 Unspecified diastolic (congestive) heart failure: Secondary | ICD-10-CM

## 2023-04-18 DIAGNOSIS — E1165 Type 2 diabetes mellitus with hyperglycemia: Secondary | ICD-10-CM

## 2023-04-18 DIAGNOSIS — E46 Unspecified protein-calorie malnutrition: Secondary | ICD-10-CM

## 2023-04-18 NOTE — Progress Notes (Signed)
Location:  Other [facility].  Nursing Home Room Number: [facility] [address] Place of Service:  SNF [phone]) Vanessa Chatters, NP  PCP: Joaquim Nam, MD  Patient Care Team: Joaquim Nam, MD as PCP - General (Family Medicine) Lockie Mola, MD as Referring Physician (Ophthalmology) Antonieta Iba, MD as Consulting Physician (Cardiology)  Extended Emergency Contact Information Primary Emergency Contact: Marlou Starks of [place] Home Phone: [phone] Relation: Daughter Secondary Emergency Contact: Rhetta Mura [place] of [place] Home Phone: [phone] Relation: Daughter  Goals of care: Advanced Directive information    04/18/2023   10:59 AM  Advanced Directives  Does Patient Have a Medical Advance Directive? Yes  Type of Advance Directive Out of facility DNR (pink MOST or yellow form)  Does patient want to make changes to medical advance directive? No - Patient declined     Chief Complaint  Patient presents with   Medical Management of Chronic Issues    Medical Management of Chronic Issues.     HPI:  Pt is a 88 y.o. female seen today for medical management of chronic disease. Pt with hx of copd, chf, svt, DM, cognitive impairment.  Pt is at [facility] for long term care after fall resulting in femur fracture s/p ORIF. She denies any pain at this time.   She is currently on jardiance for DM and CHF.  Euvolemic at this time, continues on torsemide 10 mg daily  She is on midodrine due to hypotension   On metoprolol due to a fib, rate is controlled    Past Medical History:  Diagnosis Date   Anemia    treated ~2009 with iron, resolved   Arthritis    Cataract    surgery on R catarct 2012   Chronic systolic CHF (congestive heart failure) (HCC)    a. TTE 2012: EF of 45-50%, mild diffuse HK, trivial AI, mild MR, mildly dilated RV with mild reduction of RVSF, mild to moderate TR, mild to moderate PR, mildly elevated  PASP   COPD (chronic obstructive pulmonary disease) (HCC)    Family history of adverse reaction to anesthesia    Mother - altered mental status (long term)   Glaucoma    History of pelvic fracture    Hyperglycemia    mild inc in fasting sugar   Hyperlipidemia    Hypothyroidism    Insomnia    Migraines    without aura, sx since age 44   Osteopenia    prev with 10 years of fosamax and intolerant of evista   PAF (paroxysmal atrial fibrillation) (HCC)    a. initially noted 2012; b. CHADS2VASc at least 5 (CHF, age x 2, vasacular disease, female)   Pulmonary hypertension (HCC)    Wears dentures    full upper and lower   Past Surgical History:  Procedure Laterality Date   CATARACT EXTRACTION     CATARACT EXTRACTION W/PHACO Left 08/27/2016   Procedure: CATARACT EXTRACTION PHACO AND INTRAOCULAR LENS PLACEMENT (IOC)  Left;  Surgeon: Lockie Mola, MD;  Location: [facility];  Service: Ophthalmology;  Laterality: Left;   COLONOSCOPY  2012   INTRAMEDULLARY (IM) NAIL INTERTROCHANTERIC Right 02/07/2023   Procedure: INTRAMEDULLARY (IM) NAIL INTERTROCHANTERIC;  Surgeon: Christena Flake, MD;  Location: [facility];  Service: Orthopedics;  Laterality: Right;   REFRACTIVE SURGERY      Allergies  Allergen Reactions   Codeine     Doesn't remember reaction   Cortisone     Doesn't  remember reaction   Decongestant [Pseudoephedrine Hcl] Other (See Comments)    Avoid decongestants due to glaucoma hx.     Donepezil     Prolonged Qt   Naproxen     Stomach issues   Simvastatin     aches    Outpatient Encounter Medications as of 04/18/2023  Medication Sig   acetaminophen (TYLENOL) 325 MG tablet Take 1-2 tablets (325-650 mg total) by mouth every 6 (six) hours as needed for mild pain (pain score 1-3) (pain score 1-3 or temp > 100.5).   Apoaequorin (PREVAGEN EXTRA STRENGTH) 20 MG CAPS Take 20 mg by mouth in the morning.   aspirin EC 81 MG tablet Take 81 mg by mouth daily.    Cholecalciferol (VITAMIN D) 50 MCG (2000 UT) CAPS Take 1 capsule (2,000 Units total) by mouth daily.   Cranberry 1000 MG CAPS Take by mouth daily.   empagliflozin (JARDIANCE) 10 MG TABS tablet Take 10 mg by mouth daily.   feeding supplement (ENSURE ENLIVE / ENSURE PLUS) LIQD Take 237 mLs by mouth 2 (two) times daily between meals.   levothyroxine (SYNTHROID) 25 MCG tablet TAKE 1 TABLET BY MOUTH ONCE DAILY ON AN EMPTY STOMACH. WAIT 30 MINUTES BEFORE TAKING OTHER MEDS.   melatonin 5 MG TABS Take 5 mg by mouth at bedtime as needed.   metoprolol succinate (TOPROL-XL) 25 MG 24 hr tablet Take 12.5 mg by mouth daily.   midodrine (PROAMATINE) 10 MG tablet Take 10 mg by mouth 3 (three) times daily.   pantoprazole (PROTONIX) 20 MG tablet Take 20 mg by mouth 2 (two) times daily before a meal.   polyethylene glycol (MIRALAX / GLYCOLAX) 17 g packet Take 17 g by mouth daily.   Potassium 99 MG TABS Take 2 tablets (198 mg total) by mouth daily.   senna-docusate (SENOKOT-S) 8.6-50 MG tablet Take 1 tablet by mouth 2 (two) times daily as needed for mild constipation.   torsemide (DEMADEX) 10 MG tablet Take 10 mg by mouth daily.   [DISCONTINUED] metoprolol succinate (TOPROL-XL) 25 MG 24 hr tablet Take 1 tablet (25 mg total) by mouth daily.   enoxaparin (LOVENOX) 40 MG/0.4ML injection Inject 0.4 mLs (40 mg total) into the skin at bedtime for 14 days. (Patient not taking: Reported on 03/20/2023)   furosemide (LASIX) 20 MG tablet Take 1 lasix a day.  But increase 2 tabs of lasix if AM weight is above 118 lbs. (Patient not taking: Reported on 03/20/2023)   insulin aspart (NOVOLOG) 100 UNIT/ML injection Inject 0-9 Units into the skin 3 (three) times daily with meals. (Patient not taking: Reported on 04/18/2023)   No facility-administered encounter medications on file as of 04/18/2023.    Review of Systems  Unable to perform ROS: Dementia    Immunization History  Administered Date(s) Administered   Influenza Inj  Mdck Quad Pf 01/28/2019   Influenza Split 01/25/2011, 01/24/2012, 01/21/2013   Influenza, High Dose Seasonal PF 03/15/2021, 01/08/2022, 03/07/2023   Influenza,inj,Quad PF,6+ Mos 01/14/2017   Influenza-Unspecified 01/20/2014, 01/19/2016, 01/17/2018, 01/22/2020   Moderna Sars-Covid-2 Vaccination 05/06/2019, 06/03/2019, 03/07/2020   PNEUMOCOCCAL CONJUGATE-20 03/11/2023   Pneumococcal Conjugate-13 05/24/2014   Pneumococcal Polysaccharide-23 05/14/2011   Respiratory Syncytial Virus Vaccine,Recomb Aduvanted(Arexvy) 03/11/2023   Tdap 01/07/2012, 02/07/2023   Pertinent  Health Maintenance Due  Topic Date Due   FOOT EXAM  Never done   OPHTHALMOLOGY EXAM  Never done   MAMMOGRAM  07/10/2025 (Originally 08/13/2014)   HEMOGLOBIN A1C  08/08/2023   INFLUENZA VACCINE  Completed  DEXA SCAN  Completed      05/29/2021    3:14 PM 02/15/2022    1:21 PM 05/08/2022   12:30 PM 06/13/2022    3:40 PM 08/10/2022    3:02 PM  Fall Risk  Falls in the past year?   1 0 1  Was there an injury with Fall?   0 0 1  Fall Risk Category Calculator   2 0 3  (RETIRED) Patient Fall Risk Level Low fall risk Low fall risk     Patient at Risk for Falls Due to   History of fall(s);Impaired balance/gait No Fall Risks Impaired balance/gait;Impaired mobility;History of fall(s)  Fall risk Follow up   Falls evaluation completed Education provided;Falls prevention discussed Falls evaluation completed;Falls prevention discussed   Functional Status Survey:    Vitals:   04/18/23 1023  BP: 137/80  Pulse: 73  Resp: 16  Temp: (!) 97.2 F (36.2 C)  SpO2: 91%  Weight: 119 lb 3.2 oz (54.1 kg)  Height: 5\' 4"  (1.626 m)   Body mass index is 20.46 kg/m.  Wt Readings from Last 3 Encounters:  04/18/23 119 lb 3.2 oz (54.1 kg)  03/20/23 115 lb 9.6 oz (52.4 kg)  02/18/23 127 lb 9.6 oz (57.9 kg)    Physical Exam Constitutional:      General: She is not in acute distress.    Appearance: She is well-developed. She is not  diaphoretic.  HENT:     Head: Normocephalic and atraumatic.     Mouth/Throat:     Pharynx: No oropharyngeal exudate.  Eyes:     Conjunctiva/sclera: Conjunctivae normal.     Pupils: Pupils are equal, round, and reactive to light.  Cardiovascular:     Rate and Rhythm: Normal rate and regular rhythm.     Heart sounds: Normal heart sounds.  Pulmonary:     Effort: Pulmonary effort is normal.     Breath sounds: Normal breath sounds.  Abdominal:     General: Bowel sounds are normal.     Palpations: Abdomen is soft.  Musculoskeletal:     Cervical back: Normal range of motion and neck supple.     Right lower leg: No edema.     Left lower leg: No edema.  Skin:    General: Skin is warm and dry.  Neurological:     Mental Status: She is alert.     Motor: Weakness present.     Gait: Gait abnormal.  Psychiatric:        Mood and Affect: Mood normal.     Labs reviewed: Recent Labs    02/08/23 0455 02/09/23 0631 02/10/23 0656 02/11/23 0352 02/12/23 0349 02/13/23 0932 02/15/23 0326 02/21/23 0000 03/27/23 0000 04/11/23 0000  NA 140 137   < > 140 138 137 135 142 139 139  K 4.2 4.6   < > 3.4* 3.8 3.8 3.3* 3.7 4.3 4.0  CL 110 108   < > 104 102 100 97* 97* 97* 96*  CO2 22 22   < > 27 27 26 28  32* 37* 31*  GLUCOSE 96 149*   < > 86 99 162* 125*  --   --   --   BUN 21 27*   < > 18 16 16 15 12  26* 17  CREATININE 1.08* 1.19*   < > 0.60 0.56 0.71 0.66 0.7 1.2* 1.0  CALCIUM 8.2* 8.3*   < > 8.0* 8.2* 8.8* 8.1* 8.5* 9.5 9.0  MG 1.7 2.3  --  1.9  2.0  --   --   --   --   --   PHOS 4.1 4.4  --   --  2.8  --   --   --   --   --    < > = values in this interval not displayed.   Recent Labs    08/04/22 0502 02/07/23 0814 02/10/23 0656  AST 19 37 47*  ALT 11 17 7   ALKPHOS 57 37* 37*  BILITOT 1.0 0.7 0.9  PROT 6.0* 5.0* 4.9*  ALBUMIN 3.3* 2.8* 2.6*   Recent Labs    02/07/23 0814 02/07/23 1424 02/13/23 0348 02/14/23 0452 02/15/23 0326 02/21/23 0000 04/11/23 0000  WBC 20.2*   < >  12.4* 14.3* 11.9* 9.0 10.6  NEUTROABS 17.1*  --   --   --   --  6,642.00 8,226.00  HGB 10.0*   < > 10.2* 10.7* 9.8* 10.8* 13.2  HCT 31.1*   < > 31.5* 32.3* 29.6* 34* 40  MCV 93.4   < > 91.8 92.6 92.5  --   --   PLT 208   < > 219 282 330 526* 334   < > = values in this interval not displayed.   Lab Results  Component Value Date   TSH 3.984 02/07/2023   Lab Results  Component Value Date   HGBA1C 7.7 (H) 02/07/2023   Lab Results  Component Value Date   CHOL 226 (H) 05/08/2022   HDL 62.30 05/08/2022   LDLCALC 137 (H) 05/08/2022   TRIG 133.0 05/08/2022   CHOLHDL 4 05/08/2022    Significant Diagnostic Results in last 30 days:  No results found.  Assessment/Plan 1. Type 2 diabetes mellitus with hyperglycemia, unspecified whether long term insulin use (HCC) (Primary) -Encouraged dietary compliance, routine foot care/monitoring and to keep up with diabetic eye exams through ophthalmology  -will be due for A1c next month.   2. Dementia, unspecified dementia severity, unspecified dementia type, unspecified whether behavioral, psychotic, or mood disturbance or anxiety (HCC) -Stable, no acute changes in cognitive or functional status, continue supportive care.   3. Diastolic CHF with preserved left ventricular function, NYHA class 2 (HCC) Euvolemic, continues dietary modifications   4. Protein-calorie malnutrition, unspecified severity (HCC) Eating better since admission with some positive weight gain. Continues on supplement   5. Chronic obstructive pulmonary disease, unspecified COPD type (HCC) Stable at this time.    Vanessa [name]. Biagio Borg [facility] [phone]

## 2023-04-19 NOTE — Addendum Note (Signed)
Addended by: Sharon Seller on: 04/19/2023 07:14 PM   Modules accepted: Level of Service

## 2023-04-25 DIAGNOSIS — Z79899 Other long term (current) drug therapy: Secondary | ICD-10-CM | POA: Diagnosis not present

## 2023-04-25 DIAGNOSIS — I10 Essential (primary) hypertension: Secondary | ICD-10-CM | POA: Diagnosis not present

## 2023-04-25 LAB — COMPREHENSIVE METABOLIC PANEL: Calcium: 9.7 (ref 8.7–10.7)

## 2023-04-25 LAB — BASIC METABOLIC PANEL
BUN: 18 (ref 4–21)
CO2: 34 — AB (ref 13–22)
Chloride: 96 — AB (ref 99–108)
Creatinine: 1 (ref 0.5–1.1)
Glucose: 175
Potassium: 4.2 meq/L (ref 3.5–5.1)
Sodium: 143 (ref 137–147)

## 2023-05-20 ENCOUNTER — Non-Acute Institutional Stay (SKILLED_NURSING_FACILITY): Payer: Self-pay | Admitting: Student

## 2023-05-20 DIAGNOSIS — S7291XP Unspecified fracture of right femur, subsequent encounter for closed fracture with malunion: Secondary | ICD-10-CM | POA: Diagnosis not present

## 2023-05-20 DIAGNOSIS — F0390 Unspecified dementia without behavioral disturbance: Secondary | ICD-10-CM

## 2023-05-20 DIAGNOSIS — I5030 Unspecified diastolic (congestive) heart failure: Secondary | ICD-10-CM | POA: Diagnosis not present

## 2023-05-20 DIAGNOSIS — E46 Unspecified protein-calorie malnutrition: Secondary | ICD-10-CM

## 2023-05-20 DIAGNOSIS — E1165 Type 2 diabetes mellitus with hyperglycemia: Secondary | ICD-10-CM

## 2023-05-20 DIAGNOSIS — J449 Chronic obstructive pulmonary disease, unspecified: Secondary | ICD-10-CM | POA: Diagnosis not present

## 2023-05-24 ENCOUNTER — Encounter: Payer: Self-pay | Admitting: Student

## 2023-05-24 NOTE — Progress Notes (Signed)
Location:  Other Nursing Home Room Number: [facility] [address] Place of Service:  SNF [phone]) Provider:  Ander Gaster, Benetta Spar, MD  Patient Care Team: Earnestine Mealing, MD as PCP - General (Family Medicine) Lockie Mola, MD as Referring Physician (Ophthalmology) Antonieta Iba, MD as Consulting Physician (Cardiology)  Extended Emergency Contact Information Primary Emergency Contact: Marlou Starks of [place] Home Phone: [phone] Relation: Daughter Secondary Emergency Contact: Rhetta Mura [place] of [place] Home Phone: [phone] Relation: Daughter  Code Status:  DNR Goals of care: Advanced Directive information    04/18/2023   10:59 AM  Advanced Directives  Does Patient Have a Medical Advance Directive? Yes  Type of Advance Directive Out of facility DNR (pink MOST or yellow form)  Does patient want to make changes to medical advance directive? No - Patient declined     Chief Complaint  Patient presents with   Medical Management of Chronic Conditions    HPI:  Pt is a 89 y.o. female seen today for medical management of chronic diseases.   Patient is sitting in the living room area. She states she is fine and has no concerns. She gives her name, however, needs reorientation for time and place. Often offers food to others rather than eating food herself. Currently receiving  Significant family support with daily visits and assistance with meals. Notable continued confusion and increase in behaviors.   Patient is scheduled to move to a new hall for long term care. She requires assistance with transfers  Past Medical History:  Diagnosis Date   Anemia    treated ~2009 with iron, resolved   Arthritis    Cataract    surgery on R catarct 2012   Chronic systolic CHF (congestive heart failure) (HCC)    a. TTE 2012: EF of 45-50%, mild diffuse HK, trivial AI, mild MR, mildly dilated RV with mild reduction of RVSF, mild to moderate TR,  mild to moderate PR, mildly elevated PASP   COPD (chronic obstructive pulmonary disease) (HCC)    Family history of adverse reaction to anesthesia    Mother - altered mental status (long term)   Glaucoma    History of pelvic fracture    Hyperglycemia    mild inc in fasting sugar   Hyperlipidemia    Hypothyroidism    Insomnia    Migraines    without aura, sx since age 54   Osteopenia    prev with 10 years of fosamax and intolerant of evista   PAF (paroxysmal atrial fibrillation) (HCC)    a. initially noted 2012; b. CHADS2VASc at least 5 (CHF, age x 2, vasacular disease, female)   Pulmonary hypertension (HCC)    Wears dentures    full upper and lower   Past Surgical History:  Procedure Laterality Date   CATARACT EXTRACTION     CATARACT EXTRACTION W/PHACO Left 08/27/2016   Procedure: CATARACT EXTRACTION PHACO AND INTRAOCULAR LENS PLACEMENT (IOC)  Left;  Surgeon: Lockie Mola, MD;  Location: [facility];  Service: Ophthalmology;  Laterality: Left;   COLONOSCOPY  2012   INTRAMEDULLARY (IM) NAIL INTERTROCHANTERIC Right 02/07/2023   Procedure: INTRAMEDULLARY (IM) NAIL INTERTROCHANTERIC;  Surgeon: Christena Flake, MD;  Location: [facility] ORS;  Service: Orthopedics;  Laterality: Right;   REFRACTIVE SURGERY      Allergies  Allergen Reactions   Codeine     Doesn't remember reaction   Cortisone     Doesn't remember reaction   Decongestant [Pseudoephedrine Hcl] Other (See Comments)  Avoid decongestants due to glaucoma hx.     Donepezil     Prolonged Qt   Naproxen     Stomach issues   Simvastatin     aches    Outpatient Encounter Medications as of 05/20/2023  Medication Sig   acetaminophen (TYLENOL) 325 MG tablet Take 1-2 tablets (325-650 mg total) by mouth every 6 (six) hours as needed for mild pain (pain score 1-3) (pain score 1-3 or temp > 100.5).   Apoaequorin (PREVAGEN EXTRA STRENGTH) 20 MG CAPS Take 20 mg by mouth in the morning.   aspirin EC 81 MG tablet Take 81  mg by mouth daily.   Cholecalciferol (VITAMIN D) 50 MCG (2000 UT) CAPS Take 1 capsule (2,000 Units total) by mouth daily.   Cranberry 1000 MG CAPS Take by mouth daily.   empagliflozin (JARDIANCE) 10 MG TABS tablet Take 10 mg by mouth daily.   feeding supplement (ENSURE ENLIVE / ENSURE PLUS) LIQD Take 237 mLs by mouth 2 (two) times daily between meals.   levothyroxine (SYNTHROID) 25 MCG tablet TAKE 1 TABLET BY MOUTH ONCE DAILY ON AN EMPTY STOMACH. WAIT 30 MINUTES BEFORE TAKING OTHER MEDS.   melatonin 5 MG TABS Take 5 mg by mouth at bedtime as needed.   metoprolol succinate (TOPROL-XL) 25 MG 24 hr tablet Take 12.5 mg by mouth daily.   midodrine (PROAMATINE) 10 MG tablet Take 10 mg by mouth 3 (three) times daily.   pantoprazole (PROTONIX) 20 MG tablet Take 20 mg by mouth 2 (two) times daily before a meal.   polyethylene glycol (MIRALAX / GLYCOLAX) 17 g packet Take 17 g by mouth daily.   Potassium 99 MG TABS Take 2 tablets (198 mg total) by mouth daily.   senna-docusate (SENOKOT-S) 8.6-50 MG tablet Take 1 tablet by mouth 2 (two) times daily as needed for mild constipation.   torsemide (DEMADEX) 10 MG tablet Take 10 mg by mouth daily.   No facility-administered encounter medications on file as of 05/20/2023.    Review of Systems  Immunization History  Administered Date(s) Administered   Influenza Inj Mdck Quad Pf 01/28/2019   Influenza Split 01/25/2011, 01/24/2012, 01/21/2013   Influenza, High Dose Seasonal PF 03/15/2021, 01/08/2022, 03/07/2023   Influenza,inj,Quad PF,6+ Mos 01/14/2017   Influenza-Unspecified 01/20/2014, 01/19/2016, 01/17/2018, 01/22/2020   Moderna Sars-Covid-2 Vaccination 05/06/2019, 06/03/2019, 03/07/2020   PNEUMOCOCCAL CONJUGATE-20 03/11/2023   Pneumococcal Conjugate-13 05/24/2014   Pneumococcal Polysaccharide-23 05/14/2011   Respiratory Syncytial Virus Vaccine,Recomb Aduvanted(Arexvy) 03/11/2023   Tdap 01/07/2012, 02/07/2023   Pertinent  Health Maintenance Due  Topic  Date Due   FOOT EXAM  Never done   OPHTHALMOLOGY EXAM  Never done   MAMMOGRAM  07/10/2025 (Originally 08/13/2014)   HEMOGLOBIN A1C  08/08/2023   INFLUENZA VACCINE  Completed   DEXA SCAN  Completed      05/29/2021    3:14 PM 02/15/2022    1:21 PM 05/08/2022   12:30 PM 06/13/2022    3:40 PM 08/10/2022    3:02 PM  Fall Risk  Falls in the past year?   1 0 1  Was there an injury with Fall?   0 0 1  Fall Risk Category Calculator   2 0 3  (RETIRED) Patient Fall Risk Level Low fall risk Low fall risk     Patient at Risk for Falls Due to   History of fall(s);Impaired balance/gait No Fall Risks Impaired balance/gait;Impaired mobility;History of fall(s)  Fall risk Follow up   Falls evaluation completed Education provided;Falls prevention  discussed Falls evaluation completed;Falls prevention discussed   Functional Status Survey:    Vitals:   05/20/23 0552  BP: 110/67  Pulse: 84  Resp: 18  Temp: (!) 97.4 F (36.3 C)  SpO2: 92%  Weight: 115 lb 6.4 oz (52.3 kg)   Body mass index is 19.81 kg/m. Physical Exam Constitutional:      Appearance: Normal appearance.  Cardiovascular:     Rate and Rhythm: Normal rate. Rhythm irregular.     Pulses: Normal pulses.     Heart sounds: Normal heart sounds.  Pulmonary:     Effort: Pulmonary effort is normal.  Abdominal:     General: Abdomen is flat. Bowel sounds are normal.     Palpations: Abdomen is soft.  Musculoskeletal:        General: No swelling or tenderness.  Skin:    General: Skin is warm and dry.  Neurological:     Mental Status: She is alert. She is disoriented.  Psychiatric:        Mood and Affect: Mood normal.     Labs reviewed: Recent Labs    02/08/23 0455 02/09/23 0631 02/10/23 0656 02/11/23 0352 02/12/23 0349 02/13/23 0932 02/15/23 0326 02/21/23 0000 03/27/23 0000 04/11/23 0000  NA 140 137   < > 140 138 137 135 142 139 139  K 4.2 4.6   < > 3.4* 3.8 3.8 3.3* 3.7 4.3 4.0  CL 110 108   < > 104 102 100 97* 97* 97*  96*  CO2 22 22   < > 27 27 26 28  32* 37* 31*  GLUCOSE 96 149*   < > 86 99 162* 125*  --   --   --   BUN 21 27*   < > 18 16 16 15 12  26* 17  CREATININE 1.08* 1.19*   < > 0.60 0.56 0.71 0.66 0.7 1.2* 1.0  CALCIUM 8.2* 8.3*   < > 8.0* 8.2* 8.8* 8.1* 8.5* 9.5 9.0  MG 1.7 2.3  --  1.9 2.0  --   --   --   --   --   PHOS 4.1 4.4  --   --  2.8  --   --   --   --   --    < > = values in this interval not displayed.   Recent Labs    08/04/22 0502 02/07/23 0814 02/10/23 0656  AST 19 37 47*  ALT 11 17 7   ALKPHOS 57 37* 37*  BILITOT 1.0 0.7 0.9  PROT 6.0* 5.0* 4.9*  ALBUMIN 3.3* 2.8* 2.6*   Recent Labs    02/07/23 0814 02/07/23 1424 02/13/23 0348 02/14/23 0452 02/15/23 0326 02/21/23 0000 04/11/23 0000  WBC 20.2*   < > 12.4* 14.3* 11.9* 9.0 10.6  NEUTROABS 17.1*  --   --   --   --  6,642.00 8,226.00  HGB 10.0*   < > 10.2* 10.7* 9.8* 10.8* 13.2  HCT 31.1*   < > 31.5* 32.3* 29.6* 34* 40  MCV 93.4   < > 91.8 92.6 92.5  --   --   PLT 208   < > 219 282 330 526* 334   < > = values in this interval not displayed.   Lab Results  Component Value Date   TSH 3.984 02/07/2023   Lab Results  Component Value Date   HGBA1C 7.7 (H) 02/07/2023   Lab Results  Component Value Date   CHOL 226 (H) 05/08/2022   HDL 62.30  05/08/2022   LDLCALC 137 (H) 05/08/2022   TRIG 133.0 05/08/2022   CHOLHDL 4 05/08/2022    Significant Diagnostic Results in last 30 days:  No results found.  Assessment/Plan Diastolic CHF with preserved left ventricular function, NYHA class 2 (HCC)  Dementia, unspecified dementia severity, unspecified dementia type, unspecified whether behavioral, psychotic, or mood disturbance or anxiety (HCC), Chronic  Chronic obstructive pulmonary disease, unspecified COPD type (HCC), Chronic  Type 2 diabetes mellitus with hyperglycemia, unspecified whether long term insulin use (HCC), Chronic  Protein-calorie malnutrition, unspecified severity (HCC)  Closed fracture of right  femur with malunion, unspecified fracture morphology, unspecified portion of femur, subsequent encounter Patient with history of CHF and appears euvolemic on exam. No signs of acute respiratory distress at this time. Continue torsemide and lisinopril 2.5 mg daily. Patient remains on midodrine given continued low blood pressures. Consider de-escalation of blood pressure management, however this would mean discontinuation of goal-directed therapy that can help with reduction of rehospitalization. Continue ASA given hx of CVA. Thyroid well-controlled. Hx of diabetes well-controlled at this time with jardiance. History of dementia and is currently taking namenda, however, there are notable continued signs of decline with increase in confusion. Patient has lost 4lb since last visit, likely due to poor PO intake. Continue protein supplementation. Patient remains at high risk for rehospitalization from chronic conditions. Hx of femur fracture on 02/07/2023 which was her original admission diagnosis, continue restorative physical therapy.   Family/ staff Communication: nursing  Labs/tests ordered:  none

## 2023-06-20 DIAGNOSIS — Z8709 Personal history of other diseases of the respiratory system: Secondary | ICD-10-CM | POA: Diagnosis not present

## 2023-06-20 DIAGNOSIS — R42 Dizziness and giddiness: Secondary | ICD-10-CM | POA: Diagnosis not present

## 2023-06-20 DIAGNOSIS — I471 Supraventricular tachycardia, unspecified: Secondary | ICD-10-CM | POA: Diagnosis not present

## 2023-06-20 DIAGNOSIS — E0965 Drug or chemical induced diabetes mellitus with hyperglycemia: Secondary | ICD-10-CM | POA: Diagnosis not present

## 2023-06-20 DIAGNOSIS — E785 Hyperlipidemia, unspecified: Secondary | ICD-10-CM | POA: Diagnosis not present

## 2023-06-20 DIAGNOSIS — Z87898 Personal history of other specified conditions: Secondary | ICD-10-CM | POA: Diagnosis not present

## 2023-06-20 DIAGNOSIS — J449 Chronic obstructive pulmonary disease, unspecified: Secondary | ICD-10-CM | POA: Diagnosis not present

## 2023-06-20 DIAGNOSIS — R0602 Shortness of breath: Secondary | ICD-10-CM | POA: Diagnosis not present

## 2023-06-20 DIAGNOSIS — I5042 Chronic combined systolic (congestive) and diastolic (congestive) heart failure: Secondary | ICD-10-CM | POA: Diagnosis not present

## 2023-07-09 ENCOUNTER — Non-Acute Institutional Stay (SKILLED_NURSING_FACILITY): Payer: Self-pay | Admitting: Nurse Practitioner

## 2023-07-09 ENCOUNTER — Encounter: Payer: Self-pay | Admitting: Nurse Practitioner

## 2023-07-09 ENCOUNTER — Other Ambulatory Visit: Payer: Self-pay | Admitting: Nurse Practitioner

## 2023-07-09 DIAGNOSIS — E039 Hypothyroidism, unspecified: Secondary | ICD-10-CM | POA: Diagnosis not present

## 2023-07-09 DIAGNOSIS — E782 Mixed hyperlipidemia: Secondary | ICD-10-CM

## 2023-07-09 DIAGNOSIS — F0390 Unspecified dementia without behavioral disturbance: Secondary | ICD-10-CM | POA: Diagnosis not present

## 2023-07-09 DIAGNOSIS — E46 Unspecified protein-calorie malnutrition: Secondary | ICD-10-CM | POA: Diagnosis not present

## 2023-07-09 DIAGNOSIS — E1165 Type 2 diabetes mellitus with hyperglycemia: Secondary | ICD-10-CM | POA: Diagnosis not present

## 2023-07-09 DIAGNOSIS — I5030 Unspecified diastolic (congestive) heart failure: Secondary | ICD-10-CM | POA: Diagnosis not present

## 2023-07-09 NOTE — Progress Notes (Unsigned)
 Location:  Other [facility]) Nursing Home Room Number: 308 [id number] Place of Service:  SNF (31)  Nash Bolls K. Janyth Contes, NP   Patient Care Team: Earnestine Mealing, MD as PCP - General (Family Medicine) Lockie Mola, MD as Referring Physician (Ophthalmology) Antonieta Iba, MD as Consulting Physician (Cardiology)  Extended Emergency Contact Information Primary Emergency Contact: Marlou Starks of [place] Home Phone: [phone] Relation: Daughter Secondary Emergency Contact: Rhetta Mura [place] of [place] Home Phone: [phone] Relation: Daughter  Goals of care: Advanced Directive information    04/18/2023   10:59 AM  Advanced Directives  Does Patient Have a Medical Advance Directive? Yes  Type of Advance Directive Out of facility DNR (pink MOST or yellow form)  Does patient want to make changes to medical advance directive? No - Patient declined     Chief Complaint  Patient presents with  . Medical Management of Chronic Issues    Routine visit. Discuss need for covid booster, AWV, foot exam, eye exam, and shingrix.     HPI:  Pt is a 89 y.o. female seen today for medical management of chronic disease. She is alert, and in a pleasant mood. She is able to answer most questions- but has impaired memory at baseline.   She mentions that she feels fine and her appetite is normal. She uses a walker and ambulates very well post fracture of her right femur.  She denies any changes in bladder and bowel function. She wears dentures and is able to take medications without problems.  She mentions she has no changes in memory, she just has to take time and to listen carefully so she can be active in the conversation.   Past Medical History:  Diagnosis Date  . Anemia    treated ~2009 with iron, resolved  . Arthritis   . Cataract    surgery on R catarct 2012  . Chronic systolic CHF (congestive heart failure) (HCC)    a. TTE 2012: EF of 45-50%, mild  diffuse HK, trivial AI, mild MR, mildly dilated RV with mild reduction of RVSF, mild to moderate TR, mild to moderate PR, mildly elevated PASP  . COPD (chronic obstructive pulmonary disease) (HCC)   . Family history of adverse reaction to anesthesia    Mother - altered mental status (long term)  . Glaucoma   . History of pelvic fracture   . Hyperglycemia    mild inc in fasting sugar  . Hyperlipidemia   . Hypothyroidism   . Insomnia   . Migraines    without aura, sx since age 22  . Osteopenia    prev with 10 years of fosamax and intolerant of evista  . PAF (paroxysmal atrial fibrillation) (HCC)    a. initially noted 2012; b. CHADS2VASc at least 5 (CHF, age x 2, vasacular disease, female)  . Pulmonary hypertension (HCC)   . Wears dentures    full upper and lower   Past Surgical History:  Procedure Laterality Date  . CATARACT EXTRACTION    . CATARACT EXTRACTION W/PHACO Left 08/27/2016   Procedure: CATARACT EXTRACTION PHACO AND INTRAOCULAR LENS PLACEMENT (IOC)  Left;  Surgeon: Lockie Mola, MD;  Location: [facility];  Service: Ophthalmology;  Laterality: Left;  . COLONOSCOPY  2012  . INTRAMEDULLARY (IM) NAIL INTERTROCHANTERIC Right 02/07/2023   Procedure: INTRAMEDULLARY (IM) NAIL INTERTROCHANTERIC;  Surgeon: Christena Flake, MD;  Location: [facility] ORS;  Service: Orthopedics;  Laterality: Right;  . REFRACTIVE SURGERY      Allergies  Allergen Reactions  . Codeine     Doesn't remember reaction  . Cortisone     Doesn't remember reaction  . Decongestant [Pseudoephedrine Hcl] Other (See Comments)    Avoid decongestants due to glaucoma hx.    . Donepezil     Prolonged Qt  . Naproxen     Stomach issues  . Simvastatin     aches    Outpatient Encounter Medications as of 07/09/2023  Medication Sig  . acetaminophen (TYLENOL) 325 MG tablet Take 1-2 tablets (325-650 mg total) by mouth every 6 (six) hours as needed for mild pain (pain score 1-3) (pain score 1-3 or temp >  100.5).  Marland Kitchen aspirin EC 81 MG tablet Take 81 mg by mouth daily.  . Cholecalciferol (VITAMIN D) 50 MCG (2000 UT) CAPS Take 1 capsule (2,000 Units total) by mouth daily.  . empagliflozin (JARDIANCE) 10 MG TABS tablet Take 10 mg by mouth daily.  Marland Kitchen levothyroxine (SYNTHROID) 25 MCG tablet TAKE 1 TABLET BY MOUTH ONCE DAILY ON AN EMPTY STOMACH. WAIT 30 MINUTES BEFORE TAKING OTHER MEDS.  . melatonin 5 MG TABS Take 2.5 mg by mouth at bedtime as needed.  . memantine (NAMENDA) 5 MG tablet Take by mouth.  . metoprolol succinate (TOPROL-XL) 25 MG 24 hr tablet Take 12.5 mg by mouth daily.  . midodrine (PROAMATINE) 10 MG tablet Take 10 mg by mouth 3 (three) times daily.  . pantoprazole (PROTONIX) 20 MG tablet Take 20 mg by mouth 2 (two) times daily before a meal.  . polyethylene glycol (MIRALAX / GLYCOLAX) 17 g packet Take 17 g by mouth daily.  . Potassium 99 MG TABS Take 2 tablets (198 mg total) by mouth daily.  Marland Kitchen senna-docusate (SENOKOT-S) 8.6-50 MG tablet Take 1 tablet by mouth 2 (two) times daily as needed for mild constipation.  . torsemide (DEMADEX) 10 MG tablet Take 10 mg by mouth daily.  . Zinc Oxide (TRIPLE PASTE) 40 % PSTE Apply 1 Application topically as needed (to buttocks and sacrum).  . Apoaequorin (PREVAGEN EXTRA STRENGTH) 20 MG CAPS Take 20 mg by mouth in the morning. (Patient not taking: Reported on 07/09/2023)  . Cranberry 1000 MG CAPS Take by mouth daily. (Patient not taking: Reported on 07/09/2023)  . feeding supplement (ENSURE ENLIVE / ENSURE PLUS) LIQD Take 237 mLs by mouth 2 (two) times daily between meals.  Marland Kitchen lisinopril (ZESTRIL) 2.5 MG tablet Take 2.5 mg by mouth daily. (Patient not taking: Reported on 07/09/2023)   No facility-administered encounter medications on file as of 07/09/2023.    Review of Systems  Constitutional: Negative.   HENT: Negative.    Eyes: Negative.   Respiratory: Negative.    Cardiovascular: Negative.   Gastrointestinal: Negative.   Genitourinary: Negative.    Musculoskeletal: Negative.   Skin: Negative.   Neurological: Negative.   Psychiatric/Behavioral: Negative.       Immunization History  Administered Date(s) Administered  . Influenza Inj Mdck Quad Pf 01/28/2019  . Influenza Split 01/25/2011, 01/24/2012, 01/21/2013  . Influenza, High Dose Seasonal PF 03/15/2021, 01/08/2022, 03/07/2023  . Influenza,inj,Quad PF,6+ Mos 01/14/2017  . Influenza-Unspecified 01/20/2014, 01/19/2016, 01/17/2018, 01/22/2020  . Moderna Sars-Covid-2 Vaccination 05/06/2019, 06/03/2019, 03/07/2020  . PNEUMOCOCCAL CONJUGATE-20 03/11/2023  . Pneumococcal Conjugate-13 05/24/2014  . Pneumococcal Polysaccharide-23 05/14/2011  . Respiratory Syncytial Virus Vaccine,Recomb Aduvanted(Arexvy) 03/11/2023  . Tdap 01/07/2012, 02/07/2023   Pertinent  Health Maintenance Due  Topic Date Due  . FOOT EXAM  Never done  . OPHTHALMOLOGY EXAM  Never done  .  MAMMOGRAM  07/10/2025 (Originally 08/13/2014)  . HEMOGLOBIN A1C  08/08/2023  . INFLUENZA VACCINE  Completed  . DEXA SCAN  Completed      05/29/2021    3:14 PM 02/15/2022    1:21 PM 05/08/2022   12:30 PM 06/13/2022    3:40 PM 08/10/2022    3:02 PM  Fall Risk  Falls in the past year?   1 0 1  Was there an injury with Fall?   0 0 1  Fall Risk Category Calculator   2 0 3  (RETIRED) Patient Fall Risk Level Low fall risk Low fall risk     Patient at Risk for Falls Due to   History of fall(s);Impaired balance/gait No Fall Risks Impaired balance/gait;Impaired mobility;History of fall(s)  Fall risk Follow up   Falls evaluation completed Education provided;Falls prevention discussed Falls evaluation completed;Falls prevention discussed   Functional Status Survey:    Vitals:   07/09/23 0804  BP: 124/74  Pulse: (!) 104  SpO2: 95%  Weight: 118 lb 6.4 oz (53.7 kg)  Height: 5\' 4"  (1.626 m)   Body mass index is 20.32 kg/m. Physical Exam Vitals reviewed.  Constitutional:      Appearance: Normal appearance.  HENT:     Head:  Normocephalic and atraumatic.     Nose: Nose normal.  Eyes:     Conjunctiva/sclera: Conjunctivae normal.  Cardiovascular:     Rate and Rhythm: Normal rate and regular rhythm.     Pulses: Normal pulses.     Heart sounds: Normal heart sounds.  Pulmonary:     Effort: Pulmonary effort is normal.     Breath sounds: Normal breath sounds.  Abdominal:     General: Bowel sounds are normal.     Palpations: Abdomen is soft.  Musculoskeletal:        General: Normal range of motion.  Skin:    General: Skin is warm and dry.  Neurological:     Mental Status: She is alert. Mental status is at baseline.     Comments: Memory deficit at baseline  Psychiatric:        Mood and Affect: Mood normal.        Behavior: Behavior normal.    Labs reviewed: Recent Labs    02/08/23 0455 02/09/23 0631 02/10/23 0656 02/11/23 0352 02/12/23 0349 02/13/23 0932 02/15/23 0326 02/21/23 0000 03/27/23 0000 04/11/23 0000 04/25/23 0000  NA 140 137   < > 140 138 137 135   < > 139 139 143  K 4.2 4.6   < > 3.4* 3.8 3.8 3.3*   < > 4.3 4.0 4.2  CL 110 108   < > 104 102 100 97*   < > 97* 96* 96*  CO2 22 22   < > 27 27 26 28    < > 37* 31* 34*  GLUCOSE 96 149*   < > 86 99 162* 125*  --   --   --   --   BUN 21 27*   < > 18 16 16 15    < > 26* 17 18  CREATININE 1.08* 1.19*   < > 0.60 0.56 0.71 0.66   < > 1.2* 1.0 1.0  CALCIUM 8.2* 8.3*   < > 8.0* 8.2* 8.8* 8.1*   < > 9.5 9.0 9.7  MG 1.7 2.3  --  1.9 2.0  --   --   --   --   --   --   PHOS 4.1 4.4  --   --  2.8  --   --   --   --   --   --    < > = values in this interval not displayed.   Recent Labs    08/04/22 0502 02/07/23 0814 02/10/23 0656  AST 19 37 47*  ALT 11 17 7   ALKPHOS 57 37* 37*  BILITOT 1.0 0.7 0.9  PROT 6.0* 5.0* 4.9*  ALBUMIN 3.3* 2.8* 2.6*   Recent Labs    02/07/23 0814 02/07/23 1424 02/13/23 0348 02/14/23 0452 02/15/23 0326 02/21/23 0000 04/11/23 0000  WBC 20.2*   < > 12.4* 14.3* 11.9* 9.0 10.6  NEUTROABS 17.1*  --   --   --   --   6,642.00 8,226.00  HGB 10.0*   < > 10.2* 10.7* 9.8* 10.8* 13.2  HCT 31.1*   < > 31.5* 32.3* 29.6* 34* 40  MCV 93.4   < > 91.8 92.6 92.5  --   --   PLT 208   < > 219 282 330 526* 334   < > = values in this interval not displayed.   Lab Results  Component Value Date   TSH 3.984 02/07/2023   Lab Results  Component Value Date   HGBA1C 7.7 (H) 02/07/2023   Lab Results  Component Value Date   CHOL 226 (H) 05/08/2022   HDL 62.30 05/08/2022   LDLCALC 137 (H) 05/08/2022   TRIG 133.0 05/08/2022   CHOLHDL 4 05/08/2022    Significant Diagnostic Results in last 30 days:  No results found.  Assessment/Plan  1. Type 2 diabetes mellitus with hyperglycemia, unspecified whether long term insulin use (HCC) (Primary)  - Hgb A1C 7.7 from 02/07/23, ordered to be rechecked at next lab draw - Continue Jardiance 10 mg tablet daily as prescribed  2. Protein-calorie malnutrition, unspecified severity (HCC)  - Weight today is 118 lb: remains stable - Attests to having a good appetite and eating her meals - Continue to drink Ensure supplement,consider switching to Glucerna if Hgb A1C remains elevated  3. Mixed hyperlipidemia  - Currently not on any antihyperlipidemic medication - LDL 137 and Cholesterol 226 on last lipid panel from January 2024 - Recheck lipid panel at next lab draw   4. Diastolic CHF (congestive heart failure) with preserved left ventricular function (HCC)  - At baseline, denies shortness of breath. No lower extremity edema noted on physical exam - Continue Metoprolol 12.5 mg tablet daily and Demadex 10 mg tablet daily as prescribed - Recheck CMP at next lab draw  5. Dementia, unspecified dementia severity, unspecified dementia type, unspecified whether behavioral, psychotic, or mood disturbance or anxiety (HCC)  - Memory impairment at baseline. Ability to perform ADLs at baseline. She continues to function well without any behavioral disturbances - Long term memory noted  to be better than short term memory - Able to answer questions appropriately with minimal lapse in memory - Continue taking Namenda 5 mg tablet daily as prescribed  6. Hypothyroidism, unspecified type  - Stable - Last TSH 3.9 on 02/07/23 - Will continue to monitor routinely - Continue levothyroxine 25 mcg tablet daily as prescribed  Irfat Magnus Ivan, RN, DNP-AGPCNP Student   Janene Harvey. Biagio Borg [facility] [phone]

## 2023-07-09 NOTE — Progress Notes (Deleted)
 Location:  Other [facility]) Nursing Home Room Number: 308 A Place of Service:  SNF (31)  Talon Regala K. Janyth Contes, NP   Patient Care Team: Earnestine Mealing, MD as PCP - General (Family Medicine) Lockie Mola, MD as Referring Physician (Ophthalmology) Antonieta Iba, MD as Consulting Physician (Cardiology)  Extended Emergency Contact Information Primary Emergency Contact: Marlou Starks of [place] Home Phone: [phone] Relation: Daughter Secondary Emergency Contact: Rhetta Mura [place] of [place] Home Phone: [phone] Relation: Daughter  Goals of care: Advanced Directive information    04/18/2023   10:59 AM  Advanced Directives  Does Patient Have a Medical Advance Directive? Yes  Type of Advance Directive Out of facility DNR (pink MOST or yellow form)  Does patient want to make changes to medical advance directive? No - Patient declined     Chief Complaint  Patient presents with   Medical Management of Chronic Issues    Routine visit. Discuss need for covid booster, AWV, foot exam, eye exam, and shingrix.     HPI:  Pt is a 89 y.o. female seen today for medical management of chronic disease. ***   Past Medical History:  Diagnosis Date   Anemia    treated ~2009 with iron, resolved   Arthritis    Cataract    surgery on R catarct 2012   Chronic systolic CHF (congestive heart failure) (HCC)    a. TTE 2012: EF of 45-50%, mild diffuse HK, trivial AI, mild MR, mildly dilated RV with mild reduction of RVSF, mild to moderate TR, mild to moderate PR, mildly elevated PASP   COPD (chronic obstructive pulmonary disease) (HCC)    Family history of adverse reaction to anesthesia    Mother - altered mental status (long term)   Glaucoma    History of pelvic fracture    Hyperglycemia    mild inc in fasting sugar   Hyperlipidemia    Hypothyroidism    Insomnia    Migraines    without aura, sx since age 59   Osteopenia    prev with 10  years of fosamax and intolerant of evista   PAF (paroxysmal atrial fibrillation) (HCC)    a. initially noted 2012; b. CHADS2VASc at least 5 (CHF, age x 2, vasacular disease, female)   Pulmonary hypertension (HCC)    Wears dentures    full upper and lower   Past Surgical History:  Procedure Laterality Date   CATARACT EXTRACTION     CATARACT EXTRACTION W/PHACO Left 08/27/2016   Procedure: CATARACT EXTRACTION PHACO AND INTRAOCULAR LENS PLACEMENT (IOC)  Left;  Surgeon: Lockie Mola, MD;  Location: [facility];  Service: Ophthalmology;  Laterality: Left;   COLONOSCOPY  2012   INTRAMEDULLARY (IM) NAIL INTERTROCHANTERIC Right 02/07/2023   Procedure: INTRAMEDULLARY (IM) NAIL INTERTROCHANTERIC;  Surgeon: Christena Flake, MD;  Location: [facility];  Service: Orthopedics;  Laterality: Right;   REFRACTIVE SURGERY      Allergies  Allergen Reactions   Codeine     Doesn't remember reaction   Cortisone     Doesn't remember reaction   Decongestant [Pseudoephedrine Hcl] Other (See Comments)    Avoid decongestants due to glaucoma hx.     Donepezil     Prolonged Qt   Naproxen     Stomach issues   Simvastatin     aches    Outpatient Encounter Medications as of 07/09/2023  Medication Sig   acetaminophen (TYLENOL) 325 MG tablet Take 1-2 tablets (325-650 mg total) by mouth every  6 (six) hours as needed for mild pain (pain score 1-3) (pain score 1-3 or temp > 100.5).   aspirin EC 81 MG tablet Take 81 mg by mouth daily.   Cholecalciferol (VITAMIN D) 50 MCG (2000 UT) CAPS Take 1 capsule (2,000 Units total) by mouth daily.   empagliflozin (JARDIANCE) 10 MG TABS tablet Take 10 mg by mouth daily.   levothyroxine (SYNTHROID) 25 MCG tablet TAKE 1 TABLET BY MOUTH ONCE DAILY ON AN EMPTY STOMACH. WAIT 30 MINUTES BEFORE TAKING OTHER MEDS.   melatonin 5 MG TABS Take 2.5 mg by mouth at bedtime as needed.   memantine (NAMENDA) 5 MG tablet Take by mouth.   metoprolol succinate (TOPROL-XL) 25 MG 24 hr  tablet Take 12.5 mg by mouth daily.   midodrine (PROAMATINE) 10 MG tablet Take 10 mg by mouth 3 (three) times daily.   pantoprazole (PROTONIX) 20 MG tablet Take 20 mg by mouth 2 (two) times daily before a meal.   polyethylene glycol (MIRALAX / GLYCOLAX) 17 g packet Take 17 g by mouth daily.   Potassium 99 MG TABS Take 2 tablets (198 mg total) by mouth daily.   senna-docusate (SENOKOT-S) 8.6-50 MG tablet Take 1 tablet by mouth 2 (two) times daily as needed for mild constipation.   torsemide (DEMADEX) 10 MG tablet Take 10 mg by mouth daily.   Zinc Oxide (TRIPLE PASTE) 40 % PSTE Apply 1 Application topically as needed (to buttocks and sacrum).   Apoaequorin (PREVAGEN EXTRA STRENGTH) 20 MG CAPS Take 20 mg by mouth in the morning. (Patient not taking: Reported on 07/09/2023)   Cranberry 1000 MG CAPS Take by mouth daily. (Patient not taking: Reported on 07/09/2023)   feeding supplement (ENSURE ENLIVE / ENSURE PLUS) LIQD Take 237 mLs by mouth 2 (two) times daily between meals.   lisinopril (ZESTRIL) 2.5 MG tablet Take 2.5 mg by mouth daily. (Patient not taking: Reported on 07/09/2023)   No facility-administered encounter medications on file as of 07/09/2023.    Review of Systems ***  Immunization History  Administered Date(s) Administered   Influenza Inj Mdck Quad Pf 01/28/2019   Influenza Split 01/25/2011, 01/24/2012, 01/21/2013   Influenza, High Dose Seasonal PF 03/15/2021, 01/08/2022, 03/07/2023   Influenza,inj,Quad PF,6+ Mos 01/14/2017   Influenza-Unspecified 01/20/2014, 01/19/2016, 01/17/2018, 01/22/2020   Moderna Sars-Covid-2 Vaccination 05/06/2019, 06/03/2019, 03/07/2020   PNEUMOCOCCAL CONJUGATE-20 03/11/2023   Pneumococcal Conjugate-13 05/24/2014   Pneumococcal Polysaccharide-23 05/14/2011   Respiratory Syncytial Virus Vaccine,Recomb Aduvanted(Arexvy) 03/11/2023   Tdap 01/07/2012, 02/07/2023   Pertinent  Health Maintenance Due  Topic Date Due   FOOT EXAM  Never done   OPHTHALMOLOGY  EXAM  Never done   MAMMOGRAM  07/10/2025 (Originally 08/13/2014)   HEMOGLOBIN A1C  08/08/2023   INFLUENZA VACCINE  Completed   DEXA SCAN  Completed      05/29/2021    3:14 PM 02/15/2022    1:21 PM 05/08/2022   12:30 PM 06/13/2022    3:40 PM 08/10/2022    3:02 PM  Fall Risk  Falls in the past year?   1 0 1  Was there an injury with Fall?   0 0 1  Fall Risk Category Calculator   2 0 3  (RETIRED) Patient Fall Risk Level Low fall risk Low fall risk     Patient at Risk for Falls Due to   History of fall(s);Impaired balance/gait No Fall Risks Impaired balance/gait;Impaired mobility;History of fall(s)  Fall risk Follow up   Falls evaluation completed Education provided;Falls prevention discussed  Falls evaluation completed;Falls prevention discussed   Functional Status Survey:    Vitals:   07/09/23 0804  BP: 124/74  Pulse: (!) 104  SpO2: 95%  Weight: 118 lb 6.4 oz (53.7 kg)  Height: 5\' 4"  (1.626 m)   Body mass index is 20.32 kg/m. Physical Exam***  Labs reviewed: Recent Labs    02/08/23 0455 02/09/23 0631 02/10/23 6301 02/11/23 0352 02/12/23 0349 02/13/23 0932 02/15/23 0326 02/21/23 0000 03/27/23 0000 04/11/23 0000 04/25/23 0000  NA 140 137   < > 140 138 137 135   < > 139 139 143  K 4.2 4.6   < > 3.4* 3.8 3.8 3.3*   < > 4.3 4.0 4.2  CL 110 108   < > 104 102 100 97*   < > 97* 96* 96*  CO2 22 22   < > 27 27 26 28    < > 37* 31* 34*  GLUCOSE 96 149*   < > 86 99 162* 125*  --   --   --   --   BUN 21 27*   < > 18 16 16 15    < > 26* 17 18  CREATININE 1.08* 1.19*   < > 0.60 0.56 0.71 0.66   < > 1.2* 1.0 1.0  CALCIUM 8.2* 8.3*   < > 8.0* 8.2* 8.8* 8.1*   < > 9.5 9.0 9.7  MG 1.7 2.3  --  1.9 2.0  --   --   --   --   --   --   PHOS 4.1 4.4  --   --  2.8  --   --   --   --   --   --    < > = values in this interval not displayed.   Recent Labs    08/04/22 0502 02/07/23 0814 02/10/23 0656  AST 19 37 47*  ALT 11 17 7   ALKPHOS 57 37* 37*  BILITOT 1.0 0.7 0.9  PROT 6.0* 5.0*  4.9*  ALBUMIN 3.3* 2.8* 2.6*   Recent Labs    02/07/23 0814 02/07/23 1424 02/13/23 0348 02/14/23 0452 02/15/23 0326 02/21/23 0000 04/11/23 0000  WBC 20.2*   < > 12.4* 14.3* 11.9* 9.0 10.6  NEUTROABS 17.1*  --   --   --   --  6,642.00 8,226.00  HGB 10.0*   < > 10.2* 10.7* 9.8* 10.8* 13.2  HCT 31.1*   < > 31.5* 32.3* 29.6* 34* 40  MCV 93.4   < > 91.8 92.6 92.5  --   --   PLT 208   < > 219 282 330 526* 334   < > = values in this interval not displayed.   Lab Results  Component Value Date   TSH 3.984 02/07/2023   Lab Results  Component Value Date   HGBA1C 7.7 (H) 02/07/2023   Lab Results  Component Value Date   CHOL 226 (H) 05/08/2022   HDL 62.30 05/08/2022   LDLCALC 137 (H) 05/08/2022   TRIG 133.0 05/08/2022   CHOLHDL 4 05/08/2022    Significant Diagnostic Results in last 30 days:  No results found.  Assessment/Plan No problem-specific Assessment & Plan notes found for this encounter.     Janene Harvey. [facility] [phone]

## 2023-07-11 ENCOUNTER — Encounter: Payer: Self-pay | Admitting: Student

## 2023-07-11 DIAGNOSIS — R799 Abnormal finding of blood chemistry, unspecified: Secondary | ICD-10-CM | POA: Diagnosis not present

## 2023-07-11 DIAGNOSIS — I10 Essential (primary) hypertension: Secondary | ICD-10-CM | POA: Diagnosis not present

## 2023-07-11 DIAGNOSIS — E119 Type 2 diabetes mellitus without complications: Secondary | ICD-10-CM | POA: Diagnosis not present

## 2023-07-11 LAB — CBC AND DIFFERENTIAL
HCT: 42 (ref 36–46)
Hemoglobin: 13.4 (ref 12.0–16.0)
Platelets: 299 10*3/uL (ref 150–400)
WBC: 8.4

## 2023-07-11 LAB — BASIC METABOLIC PANEL
BUN: 20 (ref 4–21)
CO2: 33 — AB (ref 13–22)
Chloride: 99 (ref 99–108)
Creatinine: 1.1 (ref 0.5–1.1)
Glucose: 125
Potassium: 4.1 meq/L (ref 3.5–5.1)
Sodium: 142 (ref 137–147)

## 2023-07-11 LAB — HEMOGLOBIN A1C: Hemoglobin A1C: 8.3

## 2023-07-11 LAB — CBC: RBC: 4.71 (ref 3.87–5.11)

## 2023-07-11 LAB — HEPATIC FUNCTION PANEL
ALT: 5 U/L — AB (ref 7–35)
AST: 14 (ref 13–35)
Alkaline Phosphatase: 73 (ref 25–125)
Bilirubin, Total: 0.4

## 2023-07-11 LAB — COMPREHENSIVE METABOLIC PANEL
Albumin: 3.7 (ref 3.5–5.0)
Calcium: 9.2 (ref 8.7–10.7)
Globulin: 2.4
eGFR: 49

## 2023-07-12 ENCOUNTER — Encounter: Payer: Self-pay | Admitting: Nurse Practitioner

## 2023-07-12 NOTE — Telephone Encounter (Signed)
Message routed to PCP Beamer, Victoria, MD  

## 2023-07-16 ENCOUNTER — Encounter: Payer: Self-pay | Admitting: Nurse Practitioner

## 2023-07-16 ENCOUNTER — Non-Acute Institutional Stay (INDEPENDENT_AMBULATORY_CARE_PROVIDER_SITE_OTHER): Payer: Self-pay | Admitting: Nurse Practitioner

## 2023-07-16 DIAGNOSIS — Z0000 Encounter for general adult medical examination without abnormal findings: Secondary | ICD-10-CM | POA: Diagnosis not present

## 2023-07-16 NOTE — Patient Instructions (Signed)
  Vanessa [name] , Thank you for taking time to come for your Medicare Wellness Visit. I appreciate your ongoing commitment to your health goals. Please review the following plan we discussed and let me know if I can assist you in the future.      This is a list of the screening recommended for you and due dates:  Health Maintenance  Topic Date Due   Complete foot exam   Never done   Eye exam for diabetics  Never done   COVID-19 Vaccine (4 - 2024-25 season) 08/01/2023*   Zoster (Shingles) Vaccine (1 of 2) 10/16/2023*   Hemoglobin A1C  01/11/2024   Medicare Annual Wellness Visit  07/15/2024   DTaP/Tdap/Td vaccine (3 - Td or Tdap) 02/06/2033   Pneumonia Vaccine  Completed   Flu Shot  Completed   DEXA scan (bone density measurement)  Completed   HPV Vaccine  Aged Out  *Topic was postponed. The date shown is not the original due date.

## 2023-07-16 NOTE — Progress Notes (Signed)
 Subjective:   Vanessa [name] is a 89 y.o. female who presents for Medicare Annual (Subsequent) preventive examination.  Visit Complete: In person at [facility]    Cardiac Risk Factors include: advanced age (>25men, >28 women);dyslipidemia;sedentary lifestyle     Objective:    Today's Vitals   07/16/23 1053  BP: 124/74  Pulse: (!) 104  Resp: 16  Temp: (!) 97.2 F (36.2 C)  SpO2: 95%  Weight: 119 lb 12.8 oz (54.3 kg)  Height: 5\' 4"  (1.626 m)   Body mass index is 20.56 kg/m.     07/16/2023   11:03 AM 04/18/2023   10:59 AM 03/20/2023    9:19 AM 02/07/2023    8:12 AM 08/03/2022    6:44 PM 08/03/2022   10:45 AM 06/13/2022    3:47 PM  Advanced Directives  Does Patient Have a Medical Advance Directive? Yes Yes Yes Unable to assess, patient is non-responsive or altered mental status Yes Yes Yes  Type of Advance Directive Out of facility DNR (pink MOST or yellow form) Out of facility DNR (pink MOST or yellow form) Living will  Healthcare Power of [place];Living will Living will;Healthcare Power of Attorney   Does patient want to make changes to medical advance directive? No - Patient declined No - Patient declined No - Patient declined  No - Patient declined    Copy of Healthcare Power of Attorney in Chart?     No - copy requested No - copy requested   Would patient like information on creating a medical advance directive?     No - Patient declined No - Patient declined     Current Medications (verified) Outpatient Encounter Medications as of 07/16/2023  Medication Sig   acetaminophen (TYLENOL) 325 MG tablet Take 1-2 tablets (325-650 mg total) by mouth every 6 (six) hours as needed for mild pain (pain score 1-3) (pain score 1-3 or temp > 100.5).   aspirin EC 81 MG tablet Take 81 mg by mouth daily.   Cholecalciferol (VITAMIN D) 50 MCG (2000 UT) CAPS Take 1 capsule (2,000 Units total) by mouth daily.   empagliflozin (JARDIANCE) 10 MG TABS tablet Take 10 mg by mouth daily.    feeding supplement (ENSURE ENLIVE / ENSURE PLUS) LIQD Take 237 mLs by mouth 2 (two) times daily between meals.   levothyroxine (SYNTHROID) 25 MCG tablet TAKE 1 TABLET BY MOUTH ONCE DAILY ON AN EMPTY STOMACH. WAIT 30 MINUTES BEFORE TAKING OTHER MEDS.   melatonin 5 MG TABS Take 2.5 mg by mouth at bedtime as needed.   memantine (NAMENDA) 5 MG tablet Take 5 mg by mouth 2 (two) times daily.   metoprolol succinate (TOPROL-XL) 25 MG 24 hr tablet Take 12.5 mg by mouth daily.   midodrine (PROAMATINE) 10 MG tablet Take 10 mg by mouth 3 (three) times daily.   pantoprazole (PROTONIX) 20 MG tablet Take 20 mg by mouth daily.   polyethylene glycol (MIRALAX / GLYCOLAX) 17 g packet Take 17 g by mouth daily.   Potassium 99 MG TABS Take 2 tablets (198 mg total) by mouth daily.   senna-docusate (SENOKOT-S) 8.6-50 MG tablet Take 1 tablet by mouth 2 (two) times daily as needed for mild constipation.   torsemide (DEMADEX) 10 MG tablet Take 10 mg by mouth daily.   Zinc Oxide (TRIPLE PASTE) 40 % PSTE Apply 1 Application topically as needed (to buttocks and sacrum).   No facility-administered encounter medications on file as of 07/16/2023.    Allergies (verified) Codeine, Cortisone,  Decongestant [pseudoephedrine hcl], Donepezil, Naproxen, Simvastatin, and Statins   History: Past Medical History:  Diagnosis Date   Anemia    treated ~2009 with iron, resolved   Arthritis    Cataract    surgery on R catarct 2012   Chronic systolic CHF (congestive heart failure) (HCC)    a. TTE 2012: EF of 45-50%, mild diffuse HK, trivial AI, mild MR, mildly dilated RV with mild reduction of RVSF, mild to moderate TR, mild to moderate PR, mildly elevated PASP   COPD (chronic obstructive pulmonary disease) (HCC)    Family history of adverse reaction to anesthesia    Mother - altered mental status (long term)   Glaucoma    History of pelvic fracture    Hyperglycemia    mild inc in fasting sugar   Hyperlipidemia    Hypothyroidism     Insomnia    Migraines    without aura, sx since age 76   Osteopenia    prev with 10 years of fosamax and intolerant of evista   PAF (paroxysmal atrial fibrillation) (HCC)    a. initially noted 2012; b. CHADS2VASc at least 5 (CHF, age x 2, vasacular disease, female)   Pulmonary hypertension (HCC)    Wears dentures    full upper and lower   Past Surgical History:  Procedure Laterality Date   CATARACT EXTRACTION     CATARACT EXTRACTION W/PHACO Left 08/27/2016   Procedure: CATARACT EXTRACTION PHACO AND INTRAOCULAR LENS PLACEMENT (IOC)  Left;  Surgeon: Lockie Mola, MD;  Location: [facility];  Service: Ophthalmology;  Laterality: Left;   COLONOSCOPY  2012   INTRAMEDULLARY (IM) NAIL INTERTROCHANTERIC Right 02/07/2023   Procedure: INTRAMEDULLARY (IM) NAIL INTERTROCHANTERIC;  Surgeon: Christena Flake, MD;  Location: [facility];  Service: Orthopedics;  Laterality: Right;   REFRACTIVE SURGERY     Family History  Problem Relation Age of Onset   Diabetes Father    Migraines Mother    Stroke Mother        TIAs   Breast cancer Neg Hx    Colon cancer Neg Hx    Social History   Socioeconomic History   Marital status: Widowed    Spouse name: Not on file   Number of children: Not on file   Years of education: Not on file   Highest education level: Some college, no degree  Occupational History   Not on file  Tobacco Use   Smoking status: Former    Current packs/day: 0.25    Average packs/day: 0.3 packs/day for 50.0 years (12.5 ttl pk-yrs)    Types: Cigarettes   Smokeless tobacco: Never   Tobacco comments:    started smoking in 1958- stopped 2020 after a fall  Vaping Use   Vaping status: Never Used  Substance and Sexual Activity   Alcohol use: No   Drug use: No   Sexual activity: Never  Other Topics Concern   Not on file  Social History Narrative   From [place].     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  Was married for 47 years.     Active in church and  senior groups.     Likes gospel singing.     Lives alone (with her dog Kirt Boys)   4 kids, all are nearby.     Social Drivers of Corporate investment banker Strain: Low Risk  (08/10/2022)   Overall Financial Resource Strain (CARDIA)    Difficulty of Paying Living  Expenses: Not very hard  Food Insecurity: No Food Insecurity (02/08/2023)   Hunger Vital Sign    Worried About Running Out of Food in the Last Year: Never true    Ran Out of Food in the Last Year: Never true  Transportation Needs: No Transportation Needs (02/08/2023)   PRAPARE - Administrator, Civil Service (Medical): No    Lack of Transportation (Non-Medical): No  Physical Activity: Unknown (08/10/2022)   Exercise Vital Sign    Days of Exercise per Week: 0 days    Minutes of Exercise per Session: Not on file  Recent Concern: Physical Activity - Inactive (08/10/2022)   Exercise Vital Sign    Days of Exercise per Week: 0 days    Minutes of Exercise per Session: 0 min  Stress: No Stress Concern Present (08/10/2022)   [employer] of Occupational Health - Occupational Stress Questionnaire    Feeling of Stress : Not at all  Social Connections: Unknown (08/10/2022)   Social Connection and Isolation Panel [NHANES]    Frequency of Communication with Friends and Family: Patient declined    Frequency of Social Gatherings with Friends and Family: More than three times a week    Attends Religious Services: Patient declined    Database administrator or Organizations: No    Attends Banker Meetings: Not on file    Marital Status: Widowed  Recent Concern: Social Connections - Moderately Isolated (06/13/2022)   Social Connection and Isolation Panel [NHANES]    Frequency of Communication with Friends and Family: More than three times a week    Frequency of Social Gatherings with Friends and Family: More than three times a week    Attends Religious Services: More than 4 times per year    Active Member of [employer]  or Organizations: No    Attends Banker Meetings: Never    Marital Status: Widowed    Tobacco Counseling Counseling given: Not Answered Tobacco comments: started smoking in 1958- stopped 2020 after a fall   Clinical Intake:  Pre-visit preparation completed: Yes  Pain : No/denies pain     BMI - recorded: 20 Nutritional Status: BMI of 19-24  Normal Nutritional Risks: None Diabetes: Yes  How often do you need to have someone help you when you read instructions, pamphlets, or other written materials from your doctor or pharmacy?: 5 - Always         Activities of Daily Living    07/16/2023    1:06 PM 02/08/2023    8:28 PM  In your present state of health, do you have any difficulty performing the following activities:  Hearing? 0 0  Vision? 0 0  Difficulty concentrating or making decisions? 1 1  Walking or climbing stairs? 1   Dressing or bathing? 1   Doing errands, shopping? 1 1  Preparing Food and eating ? N   Comment feeds herself   Using the Toilet? Y   Comment has standby assistance   In the past six months, have you accidently leaked urine? Y   Do you have problems with loss of bowel control? Y   Managing your Medications? Y   Managing your Finances? Y   Housekeeping or managing your Housekeeping? Y     Patient Care Team: Earnestine Mealing, MD as PCP - General (Family Medicine) Lockie Mola, MD as Referring Physician (Ophthalmology) Antonieta Iba, MD as Consulting Physician (Cardiology)  Indicate any recent Medical Services you may have received from  other than [facility] providers in the past year (date may be approximate).     Assessment:   This is a routine wellness examination for [place].  Hearing/Vision screen No results found.   Goals Addressed   None    Depression Screen    07/16/2023    1:15 PM 08/10/2022    3:03 PM 06/13/2022    3:42 PM 05/08/2022   12:30 PM 05/04/2021   12:50 PM 03/22/2020    8:31 AM 03/16/2019     8:58 AM  PHQ 2/9 Scores  PHQ - 2 Score 0 0 0 3 0 0 0  PHQ- 9 Score  0 0 15       Fall Risk    07/16/2023    1:13 PM 08/10/2022    3:02 PM 06/13/2022    3:40 PM 05/08/2022   12:30 PM 05/04/2021   12:50 PM  Fall Risk   Falls in the past year? 1 1 0 1 1  Number falls in past yr: 0 1 0 1 1  Injury with Fall? 1 1 0 0 0  Risk for fall due to : History of fall(s) Impaired balance/gait;Impaired mobility;History of fall(s) No Fall Risks History of fall(s);Impaired balance/gait History of fall(s);Impaired balance/gait  Follow up  Falls evaluation completed;Falls prevention discussed Education provided;Falls prevention discussed Falls evaluation completed Falls evaluation completed    MEDICARE RISK AT HOME: Medicare Risk at Home Any stairs in or around the home?: No Home free of loose throw rugs in walkways, pet beds, electrical cords, etc?: Yes Adequate lighting in your home to reduce risk of falls?: Yes Life alert?: No Use of a cane, walker or w/c?: Yes Grab bars in the bathroom?: Yes Shower chair or bench in shower?: Yes Elevated toilet seat or a handicapped toilet?: Yes  TIMED UP AND GO:  Was the test performed?  No    Cognitive Function:    07/10/2016   11:19 AM 07/07/2015    2:11 PM  MMSE - Mini Mental State Exam  Orientation to time 5 5  Orientation to Place 5 5  Registration 3 3  Attention/ Calculation 0 5  Recall 3 3  Language- name 2 objects 0 0  Language- repeat 1 1  Language- follow 3 step command 3 3  Language- read & follow direction 0 1  Write a sentence 0 0  Copy design 0 0  Total score 20 26        06/13/2022    3:47 PM  6CIT Screen  What Year? 0 points  What month? 0 points  What time? 0 points  Count back from 20 0 points  Months in reverse 0 points  Repeat phrase 0 points  Total Score 0 points    Immunizations Immunization History  Administered Date(s) Administered   Influenza Inj Mdck Quad Pf 01/28/2019   Influenza Split 01/25/2011,  01/24/2012, 01/21/2013   Influenza, High Dose Seasonal PF 03/15/2021, 01/08/2022, 03/07/2023   Influenza,inj,Quad PF,6+ Mos 01/14/2017   Influenza-Unspecified 01/20/2014, 01/19/2016, 01/17/2018, 01/22/2020   Moderna Sars-Covid-2 Vaccination 05/06/2019, 06/03/2019, 03/07/2020   PNEUMOCOCCAL CONJUGATE-20 03/11/2023   Pneumococcal Conjugate-13 05/24/2014   Pneumococcal Polysaccharide-23 05/14/2011   Respiratory Syncytial Virus Vaccine,Recomb Aduvanted(Arexvy) 03/11/2023   Tdap 01/07/2012, 02/07/2023    TDAP status: Up to date  Flu Vaccine status: Up to date  Pneumococcal vaccine status: Up to date  Covid-19 vaccine status: Information provided on how to obtain vaccines.   Qualifies for Shingles Vaccine? Yes   Zostavax completed No  Shingrix Completed?: no, pt declines  Screening Tests Health Maintenance  Topic Date Due   FOOT EXAM  Never done   OPHTHALMOLOGY EXAM  Never done   COVID-19 Vaccine (4 - 2024-25 season) 08/01/2023 (Originally 12/23/2022)   Zoster Vaccines- Shingrix (1 of 2) 10/16/2023 (Originally 10/05/1953)   HEMOGLOBIN A1C  01/11/2024   Medicare Annual Wellness (AWV)  07/15/2024   DTaP/Tdap/Td (3 - Td or Tdap) 02/06/2033   Pneumonia Vaccine 53+ Years old  Completed   INFLUENZA VACCINE  Completed   DEXA SCAN  Completed   HPV VACCINES  Aged Out    Health Maintenance  Health Maintenance Due  Topic Date Due   FOOT EXAM  Never done   OPHTHALMOLOGY EXAM  Never done    Colorectal cancer screening: No longer required.   Mammogram status: No longer required due to age.  Does not qualify   Lung Cancer Screening: (Low Dose CT Chest recommended if Age 69-80 years, 20 pack-year currently smoking OR have quit w/in 15years.) does not qualify.   Lung Cancer Screening Referral: na  Additional Screening:  Hepatitis C Screening: does not qualify  Vision Screening: Recommended annual ophthalmology exams for early detection of glaucoma and other disorders of the  eye. Is the patient up to date with their annual eye exam?  No  Who is the provider or what is the name of the office in which the patient attends annual eye exams? Needs eye doctor  If pt is not established with a provider, would they like to be referred to a provider to establish care? No .   Dental Screening: Recommended annual dental exams for proper oral hygiene  Diabetic Foot Exam: Diabetic Foot Exam: Completed today  Community Resource Referral / Chronic Care Management: CRR required this visit?  No   CCM required this visit?  No     Plan:     I have personally reviewed and noted the following in the patient's chart:   Medical and social history Use of alcohol, tobacco or illicit drugs  Current medications and supplements including opioid prescriptions. Patient is not currently taking opioid prescriptions. Functional ability and status Nutritional status Physical activity Advanced directives List of other physicians Hospitalizations, surgeries, and ER visits in previous 12 months Vitals Screenings to include cognitive, depression, and falls Referrals and appointments  In addition, I have reviewed and discussed with patient certain preventive protocols, quality metrics, and best practice recommendations. A written personalized care plan for preventive services as well as general preventive health recommendations were provided to patient.     Sharon Seller, NP   07/16/2023   After Visit Summary: (MyChart) Due to this being a telephonic visit, the after visit summary with patients personalized plan was offered to patient via MyChart

## 2023-08-04 ENCOUNTER — Other Ambulatory Visit: Payer: Self-pay

## 2023-08-04 ENCOUNTER — Inpatient Hospital Stay

## 2023-08-04 ENCOUNTER — Inpatient Hospital Stay: Admitting: Certified Registered Nurse Anesthetist

## 2023-08-04 ENCOUNTER — Inpatient Hospital Stay
Admission: EM | Admit: 2023-08-04 | Discharge: 2023-08-07 | DRG: 481 | Disposition: A | Discharge status: Skilled Nursing Facility | Source: Skilled Nursing Facility | Attending: Internal Medicine | Admitting: Internal Medicine

## 2023-08-04 ENCOUNTER — Emergency Department

## 2023-08-04 ENCOUNTER — Encounter
Admission: EM | Disposition: A | Discharge status: Skilled Nursing Facility | Payer: Self-pay | Source: Skilled Nursing Facility | Attending: Obstetrics and Gynecology

## 2023-08-04 DIAGNOSIS — E1165 Type 2 diabetes mellitus with hyperglycemia: Secondary | ICD-10-CM | POA: Diagnosis not present

## 2023-08-04 DIAGNOSIS — I110 Hypertensive heart disease with heart failure: Secondary | ICD-10-CM | POA: Diagnosis not present

## 2023-08-04 DIAGNOSIS — I959 Hypotension, unspecified: Secondary | ICD-10-CM | POA: Diagnosis present

## 2023-08-04 DIAGNOSIS — E1151 Type 2 diabetes mellitus with diabetic peripheral angiopathy without gangrene: Secondary | ICD-10-CM | POA: Diagnosis present

## 2023-08-04 DIAGNOSIS — S72145A Nondisplaced intertrochanteric fracture of left femur, initial encounter for closed fracture: Secondary | ICD-10-CM

## 2023-08-04 DIAGNOSIS — S7290XA Unspecified fracture of unspecified femur, initial encounter for closed fracture: Secondary | ICD-10-CM | POA: Diagnosis not present

## 2023-08-04 DIAGNOSIS — Z79890 Hormone replacement therapy (postmenopausal): Secondary | ICD-10-CM

## 2023-08-04 DIAGNOSIS — J449 Chronic obstructive pulmonary disease, unspecified: Secondary | ICD-10-CM | POA: Diagnosis present

## 2023-08-04 DIAGNOSIS — Z043 Encounter for examination and observation following other accident: Secondary | ICD-10-CM | POA: Diagnosis not present

## 2023-08-04 DIAGNOSIS — I5020 Unspecified systolic (congestive) heart failure: Secondary | ICD-10-CM | POA: Diagnosis not present

## 2023-08-04 DIAGNOSIS — I5042 Chronic combined systolic (congestive) and diastolic (congestive) heart failure: Secondary | ICD-10-CM | POA: Diagnosis present

## 2023-08-04 DIAGNOSIS — W19XXXA Unspecified fall, initial encounter: Secondary | ICD-10-CM | POA: Diagnosis not present

## 2023-08-04 DIAGNOSIS — E039 Hypothyroidism, unspecified: Secondary | ICD-10-CM | POA: Diagnosis not present

## 2023-08-04 DIAGNOSIS — Z87891 Personal history of nicotine dependence: Secondary | ICD-10-CM

## 2023-08-04 DIAGNOSIS — Z96642 Presence of left artificial hip joint: Secondary | ICD-10-CM | POA: Diagnosis not present

## 2023-08-04 DIAGNOSIS — I480 Paroxysmal atrial fibrillation: Secondary | ICD-10-CM | POA: Diagnosis present

## 2023-08-04 DIAGNOSIS — N179 Acute kidney failure, unspecified: Secondary | ICD-10-CM | POA: Diagnosis not present

## 2023-08-04 DIAGNOSIS — I9589 Other hypotension: Secondary | ICD-10-CM | POA: Diagnosis present

## 2023-08-04 DIAGNOSIS — I739 Peripheral vascular disease, unspecified: Secondary | ICD-10-CM | POA: Diagnosis not present

## 2023-08-04 DIAGNOSIS — Z6821 Body mass index (BMI) 21.0-21.9, adult: Secondary | ICD-10-CM

## 2023-08-04 DIAGNOSIS — Z7401 Bed confinement status: Secondary | ICD-10-CM | POA: Diagnosis not present

## 2023-08-04 DIAGNOSIS — R0689 Other abnormalities of breathing: Secondary | ICD-10-CM | POA: Diagnosis not present

## 2023-08-04 DIAGNOSIS — E46 Unspecified protein-calorie malnutrition: Secondary | ICD-10-CM | POA: Diagnosis present

## 2023-08-04 DIAGNOSIS — F0390 Unspecified dementia without behavioral disturbance: Secondary | ICD-10-CM | POA: Diagnosis present

## 2023-08-04 DIAGNOSIS — J849 Interstitial pulmonary disease, unspecified: Secondary | ICD-10-CM | POA: Diagnosis not present

## 2023-08-04 DIAGNOSIS — Z7982 Long term (current) use of aspirin: Secondary | ICD-10-CM

## 2023-08-04 DIAGNOSIS — M84459A Pathological fracture, hip, unspecified, initial encounter for fracture: Secondary | ICD-10-CM | POA: Diagnosis not present

## 2023-08-04 DIAGNOSIS — F03A0 Unspecified dementia, mild, without behavioral disturbance, psychotic disturbance, mood disturbance, and anxiety: Secondary | ICD-10-CM | POA: Diagnosis not present

## 2023-08-04 DIAGNOSIS — Y92128 Other place in nursing home as the place of occurrence of the external cause: Secondary | ICD-10-CM

## 2023-08-04 DIAGNOSIS — S72002A Fracture of unspecified part of neck of left femur, initial encounter for closed fracture: Principal | ICD-10-CM

## 2023-08-04 DIAGNOSIS — E119 Type 2 diabetes mellitus without complications: Secondary | ICD-10-CM | POA: Diagnosis present

## 2023-08-04 DIAGNOSIS — Z01810 Encounter for preprocedural cardiovascular examination: Secondary | ICD-10-CM | POA: Diagnosis not present

## 2023-08-04 DIAGNOSIS — E872 Acidosis, unspecified: Secondary | ICD-10-CM | POA: Diagnosis not present

## 2023-08-04 DIAGNOSIS — I10 Essential (primary) hypertension: Secondary | ICD-10-CM | POA: Diagnosis not present

## 2023-08-04 DIAGNOSIS — I272 Pulmonary hypertension, unspecified: Secondary | ICD-10-CM | POA: Diagnosis present

## 2023-08-04 DIAGNOSIS — S79929A Unspecified injury of unspecified thigh, initial encounter: Secondary | ICD-10-CM | POA: Diagnosis not present

## 2023-08-04 DIAGNOSIS — S79919A Unspecified injury of unspecified hip, initial encounter: Secondary | ICD-10-CM | POA: Diagnosis not present

## 2023-08-04 DIAGNOSIS — S72142A Displaced intertrochanteric fracture of left femur, initial encounter for closed fracture: Principal | ICD-10-CM | POA: Diagnosis present

## 2023-08-04 DIAGNOSIS — W07XXXA Fall from chair, initial encounter: Secondary | ICD-10-CM | POA: Diagnosis present

## 2023-08-04 DIAGNOSIS — R7989 Other specified abnormal findings of blood chemistry: Secondary | ICD-10-CM | POA: Diagnosis not present

## 2023-08-04 DIAGNOSIS — M25552 Pain in left hip: Secondary | ICD-10-CM | POA: Diagnosis not present

## 2023-08-04 DIAGNOSIS — E440 Moderate protein-calorie malnutrition: Secondary | ICD-10-CM | POA: Diagnosis present

## 2023-08-04 DIAGNOSIS — S72142D Displaced intertrochanteric fracture of left femur, subsequent encounter for closed fracture with routine healing: Secondary | ICD-10-CM | POA: Diagnosis not present

## 2023-08-04 DIAGNOSIS — K449 Diaphragmatic hernia without obstruction or gangrene: Secondary | ICD-10-CM | POA: Diagnosis not present

## 2023-08-04 DIAGNOSIS — E785 Hyperlipidemia, unspecified: Secondary | ICD-10-CM | POA: Diagnosis not present

## 2023-08-04 DIAGNOSIS — Z66 Do not resuscitate: Secondary | ICD-10-CM | POA: Diagnosis present

## 2023-08-04 DIAGNOSIS — Z7984 Long term (current) use of oral hypoglycemic drugs: Secondary | ICD-10-CM

## 2023-08-04 DIAGNOSIS — W1830XA Fall on same level, unspecified, initial encounter: Secondary | ICD-10-CM | POA: Diagnosis not present

## 2023-08-04 DIAGNOSIS — I5030 Unspecified diastolic (congestive) heart failure: Secondary | ICD-10-CM | POA: Diagnosis present

## 2023-08-04 HISTORY — PX: INTRAMEDULLARY (IM) NAIL INTERTROCHANTERIC: SHX5875

## 2023-08-04 LAB — URINALYSIS, ROUTINE W REFLEX MICROSCOPIC
Bilirubin Urine: NEGATIVE
Glucose, UA: >=500 mg/dL — AB
Hgb urine dipstick: NEGATIVE
Ketones, ur: NEGATIVE mg/dL
Leukocytes,Ua: NEGATIVE
Nitrite: NEGATIVE
Protein, ur: NEGATIVE mg/dL
Specific Gravity, Urine: 1.017 (ref 1.005–1.030)
pH: 5 (ref 5.0–8.0)

## 2023-08-04 LAB — CBG MONITORING, ED: Glucose-Capillary: 253 mg/dL — ABNORMAL HIGH (ref 70–99)

## 2023-08-04 LAB — CBC
HCT: 44.7 % (ref 36.0–46.0)
HCT: 37.5 % (ref 36.0–46.0)
Hemoglobin: 14.2 g/dL (ref 12.0–15.0)
Hemoglobin: 11.8 g/dL — ABNORMAL LOW (ref 12.0–15.0)
MCH: 28.7 pg (ref 26.0–34.0)
MCH: 29 pg (ref 26.0–34.0)
MCHC: 31.8 g/dL (ref 30.0–36.0)
MCHC: 31.5 g/dL (ref 30.0–36.0)
MCV: 90.3 fL (ref 80.0–100.0)
MCV: 92.1 fL (ref 80.0–100.0)
Platelets: 276 10*3/uL (ref 150–400)
Platelets: 261 10*3/uL (ref 150–400)
RBC: 4.95 MIL/uL (ref 3.87–5.11)
RBC: 4.07 MIL/uL (ref 3.87–5.11)
RDW: 14.3 % (ref 11.5–15.5)
RDW: 14.4 % (ref 11.5–15.5)
WBC: 8.8 10*3/uL (ref 4.0–10.5)
WBC: 19 10*3/uL — ABNORMAL HIGH (ref 4.0–10.5)
nRBC: 0 % (ref 0.0–0.2)
nRBC: 0 % (ref 0.0–0.2)

## 2023-08-04 LAB — BASIC METABOLIC PANEL WITH GFR
Anion gap: 12 (ref 5–15)
Anion gap: 14 (ref 5–15)
BUN: 21 mg/dL (ref 8–23)
BUN: 24 mg/dL — ABNORMAL HIGH (ref 8–23)
CO2: 28 mmol/L (ref 22–32)
CO2: 24 mmol/L (ref 22–32)
Calcium: 9.2 mg/dL (ref 8.9–10.3)
Calcium: 8.5 mg/dL — ABNORMAL LOW (ref 8.9–10.3)
Chloride: 98 mmol/L (ref 98–111)
Chloride: 99 mmol/L (ref 98–111)
Creatinine, Ser: 1.23 mg/dL — ABNORMAL HIGH (ref 0.44–1.00)
Creatinine, Ser: 1.47 mg/dL — ABNORMAL HIGH (ref 0.44–1.00)
GFR, Estimated: 43 mL/min — ABNORMAL LOW (ref >60)
GFR, Estimated: 35 mL/min — ABNORMAL LOW (ref >60)
Glucose, Bld: 230 mg/dL — ABNORMAL HIGH (ref 70–99)
Glucose, Bld: 289 mg/dL — ABNORMAL HIGH (ref 70–99)
Potassium: 4 mmol/L (ref 3.5–5.1)
Potassium: 4.5 mmol/L (ref 3.5–5.1)
Sodium: 138 mmol/L (ref 135–145)
Sodium: 137 mmol/L (ref 135–145)

## 2023-08-04 LAB — PROTIME-INR
INR: 1.1 (ref 0.8–1.2)
Prothrombin Time: 14.2 s (ref 11.4–15.2)

## 2023-08-04 LAB — LACTIC ACID, PLASMA: Lactic Acid, Venous: 3.8 mmol/L (ref 0.5–1.9)

## 2023-08-04 LAB — TYPE AND SCREEN
ABO/RH(D): A NEG
Antibody Screen: NEGATIVE

## 2023-08-04 LAB — TROPONIN I (HIGH SENSITIVITY)
Troponin I (High Sensitivity): 65 ng/L — ABNORMAL HIGH (ref <18)
Troponin I (High Sensitivity): 56 ng/L — ABNORMAL HIGH (ref <18)

## 2023-08-04 LAB — GLUCOSE, CAPILLARY: Glucose-Capillary: 232 mg/dL — ABNORMAL HIGH (ref 70–99)

## 2023-08-04 LAB — MRSA NEXT GEN BY PCR, NASAL: MRSA by PCR Next Gen: NOT DETECTED

## 2023-08-04 LAB — PROCALCITONIN: Procalcitonin: 0.76 ng/mL

## 2023-08-04 SURGERY — FIXATION, FRACTURE, INTERTROCHANTERIC, WITH INTRAMEDULLARY ROD
Anesthesia: General | Site: Hip | Laterality: Left

## 2023-08-04 MED ORDER — SENNA 8.6 MG PO TABS
1.0000 | ORAL_TABLET | Freq: Two times a day (BID) | ORAL | Status: DC
  Administered 2023-08-05 – 2023-08-07 (×5): 8.6 mg via ORAL
  Filled 2023-08-04 (×5): qty 1

## 2023-08-04 MED ORDER — GENTAMICIN SULFATE 40 MG/ML IJ SOLN
INTRAMUSCULAR | Status: AC
  Filled 2023-08-04: qty 2

## 2023-08-04 MED ORDER — CEFAZOLIN SODIUM-DEXTROSE 2-4 GM/100ML-% IV SOLN
2.0000 g | INTRAVENOUS | Status: AC
  Administered 2023-08-04: 2 g via INTRAVENOUS

## 2023-08-04 MED ORDER — LEVOTHYROXINE SODIUM 25 MCG PO TABS
25.0000 ug | ORAL_TABLET | Freq: Every day | ORAL | Status: DC
  Administered 2023-08-06 – 2023-08-07 (×2): 25 ug via ORAL
  Filled 2023-08-04 (×2): qty 1

## 2023-08-04 MED ORDER — CHLORHEXIDINE GLUCONATE CLOTH 2 % EX PADS
6.0000 | MEDICATED_PAD | Freq: Every day | CUTANEOUS | Status: DC
  Administered 2023-08-05 – 2023-08-06 (×2): 6 via TOPICAL

## 2023-08-04 MED ORDER — TRANEXAMIC ACID-NACL 1000-0.7 MG/100ML-% IV SOLN
1000.0000 mg | Freq: Once | INTRAVENOUS | Status: AC
  Administered 2023-08-04: 1000 mg via INTRAVENOUS

## 2023-08-04 MED ORDER — FENTANYL CITRATE PF 50 MCG/ML IJ SOSY
50.0000 ug | PREFILLED_SYRINGE | Freq: Once | INTRAMUSCULAR | Status: AC
  Administered 2023-08-04: 50 ug via INTRAVENOUS
  Filled 2023-08-04: qty 1

## 2023-08-04 MED ORDER — DEXAMETHASONE SODIUM PHOSPHATE 10 MG/ML IJ SOLN
INTRAMUSCULAR | Status: DC | PRN
  Administered 2023-08-04: 5 mg via INTRAVENOUS

## 2023-08-04 MED ORDER — TRANEXAMIC ACID-NACL 1000-0.7 MG/100ML-% IV SOLN
INTRAVENOUS | Status: AC
  Filled 2023-08-04: qty 100

## 2023-08-04 MED ORDER — FENTANYL CITRATE (PF) 100 MCG/2ML IJ SOLN
INTRAMUSCULAR | Status: DC | PRN
  Administered 2023-08-04: 50 ug via INTRAVENOUS

## 2023-08-04 MED ORDER — GABAPENTIN 100 MG PO CAPS
200.0000 mg | ORAL_CAPSULE | Freq: Two times a day (BID) | ORAL | Status: DC
  Administered 2023-08-05 – 2023-08-07 (×5): 200 mg via ORAL
  Filled 2023-08-04 (×5): qty 2

## 2023-08-04 MED ORDER — SODIUM CHLORIDE 0.9 % IV BOLUS
500.0000 mL | Freq: Once | INTRAVENOUS | Status: AC
  Administered 2023-08-05: 500 mL via INTRAVENOUS

## 2023-08-04 MED ORDER — SODIUM CHLORIDE 0.9 % IR SOLN
Status: DC | PRN
  Administered 2023-08-04: 1000 mL

## 2023-08-04 MED ORDER — ACETAMINOPHEN 10 MG/ML IV SOLN: 1000.0000 mg | Freq: Once | INTRAVENOUS | Status: DC | PRN

## 2023-08-04 MED ORDER — FENTANYL CITRATE (PF) 100 MCG/2ML IJ SOLN: 25.0000 ug | INTRAMUSCULAR | Status: DC | PRN

## 2023-08-04 MED ORDER — ZOLPIDEM TARTRATE 5 MG PO TABS: 5.0000 mg | ORAL_TABLET | Freq: Every evening | ORAL | Status: DC | PRN

## 2023-08-04 MED ORDER — POLYETHYLENE GLYCOL 3350 17 G PO PACK: 17.0000 g | PACK | Freq: Every day | ORAL | Status: DC | PRN

## 2023-08-04 MED ORDER — SODIUM CHLORIDE 0.9 % IV BOLUS
500.0000 mL | Freq: Once | INTRAVENOUS | Status: AC
  Administered 2023-08-04: 500 mL via INTRAVENOUS

## 2023-08-04 MED ORDER — CEFAZOLIN SODIUM-DEXTROSE 2-4 GM/100ML-% IV SOLN
2.0000 g | Freq: Three times a day (TID) | INTRAVENOUS | Status: AC
  Administered 2023-08-05 (×3): 2 g via INTRAVENOUS
  Filled 2023-08-04 (×3): qty 100

## 2023-08-04 MED ORDER — MENTHOL 3 MG MT LOZG: 1.0000 | LOZENGE | OROMUCOSAL | Status: DC | PRN

## 2023-08-04 MED ORDER — METOPROLOL TARTRATE 5 MG/5ML IV SOLN
2.5000 mg | Freq: Once | INTRAVENOUS | Status: AC
  Administered 2023-08-04: 2.5 mg via INTRAVENOUS
  Filled 2023-08-04: qty 5

## 2023-08-04 MED ORDER — MORPHINE SULFATE (PF) 2 MG/ML IV SOLN: 1.0000 mg | INTRAVENOUS | Status: DC | PRN

## 2023-08-04 MED ORDER — IPRATROPIUM-ALBUTEROL 0.5-2.5 (3) MG/3ML IN SOLN: 3.0000 mL | RESPIRATORY_TRACT | Status: DC | PRN

## 2023-08-04 MED ORDER — ACETAMINOPHEN 10 MG/ML IV SOLN
INTRAVENOUS | Status: DC | PRN
  Administered 2023-08-04: 1000 mg via INTRAVENOUS

## 2023-08-04 MED ORDER — METOPROLOL SUCCINATE ER 25 MG PO TB24: 12.5000 mg | ORAL_TABLET | Freq: Every day | ORAL | Status: DC

## 2023-08-04 MED ORDER — HYDROMORPHONE HCL 1 MG/ML IJ SOLN
0.5000 mg | INTRAMUSCULAR | Status: DC | PRN
  Administered 2023-08-04: 0.5 mg via INTRAVENOUS
  Filled 2023-08-04: qty 0.5

## 2023-08-04 MED ORDER — CELECOXIB 200 MG PO CAPS
200.0000 mg | ORAL_CAPSULE | Freq: Two times a day (BID) | ORAL | Status: DC
  Filled 2023-08-04: qty 1

## 2023-08-04 MED ORDER — METOCLOPRAMIDE HCL 5 MG/ML IJ SOLN
5.0000 mg | Freq: Three times a day (TID) | INTRAMUSCULAR | Status: DC | PRN
  Administered 2023-08-05: 10 mg via INTRAVENOUS
  Filled 2023-08-04: qty 2

## 2023-08-04 MED ORDER — MEMANTINE HCL 5 MG PO TABS
5.0000 mg | ORAL_TABLET | Freq: Two times a day (BID) | ORAL | Status: DC
  Filled 2023-08-04: qty 1

## 2023-08-04 MED ORDER — MIDODRINE HCL 5 MG PO TABS
10.0000 mg | ORAL_TABLET | Freq: Three times a day (TID) | ORAL | Status: DC
  Administered 2023-08-05 – 2023-08-07 (×7): 10 mg via ORAL
  Filled 2023-08-04 (×7): qty 2

## 2023-08-04 MED ORDER — INSULIN ASPART 100 UNIT/ML IJ SOLN: 0.0000 [IU] | Freq: Three times a day (TID) | INTRAMUSCULAR | Status: DC

## 2023-08-04 MED ORDER — ENSURE ENLIVE PO LIQD: 237.0000 mL | Freq: Two times a day (BID) | ORAL | Status: DC

## 2023-08-04 MED ORDER — CEFAZOLIN SODIUM-DEXTROSE 2-4 GM/100ML-% IV SOLN
INTRAVENOUS | Status: AC
  Filled 2023-08-04: qty 100

## 2023-08-04 MED ORDER — ONDANSETRON HCL 4 MG/2ML IJ SOLN
INTRAMUSCULAR | Status: DC | PRN
  Administered 2023-08-04: 4 mg via INTRAVENOUS

## 2023-08-04 MED ORDER — FLEET ENEMA RE ENEM: 1.0000 | ENEMA | Freq: Once | RECTAL | Status: DC | PRN

## 2023-08-04 MED ORDER — LACTATED RINGERS IV SOLN: INTRAVENOUS | Status: DC | PRN

## 2023-08-04 MED ORDER — LIDOCAINE HCL (CARDIAC) PF 100 MG/5ML IV SOSY
PREFILLED_SYRINGE | INTRAVENOUS | Status: DC | PRN
  Administered 2023-08-04: 50 mg via INTRAVENOUS

## 2023-08-04 MED ORDER — TRANEXAMIC ACID-NACL 1000-0.7 MG/100ML-% IV SOLN
1000.0000 mg | INTRAVENOUS | Status: AC
  Administered 2023-08-04: 1000 mg via INTRAVENOUS

## 2023-08-04 MED ORDER — SODIUM CHLORIDE 0.9 % IV SOLN: INTRAVENOUS | Status: DC

## 2023-08-04 MED ORDER — POLYETHYLENE GLYCOL 3350 17 G PO PACK: 17.0000 g | PACK | Freq: Every day | ORAL | Status: DC

## 2023-08-04 MED ORDER — PROPOFOL 10 MG/ML IV BOLUS
INTRAVENOUS | Status: DC | PRN
  Administered 2023-08-04: 50 mg via INTRAVENOUS

## 2023-08-04 MED ORDER — ACETAMINOPHEN 325 MG PO TABS
325.0000 mg | ORAL_TABLET | Freq: Four times a day (QID) | ORAL | Status: DC | PRN
  Administered 2023-08-05 – 2023-08-06 (×2): 650 mg via ORAL
  Filled 2023-08-04 (×2): qty 2

## 2023-08-04 MED ORDER — PHENYLEPHRINE 80 MCG/ML (10ML) SYRINGE FOR IV PUSH (FOR BLOOD PRESSURE SUPPORT)
PREFILLED_SYRINGE | INTRAVENOUS | Status: DC | PRN
  Administered 2023-08-04: 240 ug via INTRAVENOUS
  Administered 2023-08-04 (×3): 160 ug via INTRAVENOUS
  Administered 2023-08-04: 200 ug via INTRAVENOUS
  Administered 2023-08-04 (×2): 160 ug via INTRAVENOUS
  Administered 2023-08-04: 240 ug via INTRAVENOUS
  Administered 2023-08-04 (×5): 160 ug via INTRAVENOUS

## 2023-08-04 MED ORDER — PHENYLEPHRINE HCL-NACL 20-0.9 MG/250ML-% IV SOLN
25.0000 ug/min | INTRAVENOUS | Status: DC
Start: 2023-08-04 — End: 2023-08-06
  Administered 2023-08-04: 50 ug/min via INTRAVENOUS
  Administered 2023-08-04: 30 ug/min via INTRAVENOUS
  Administered 2023-08-04: 75 ug/min via INTRAVENOUS
  Administered 2023-08-05: 100 ug/min via INTRAVENOUS
  Filled 2023-08-04: qty 250

## 2023-08-04 MED ORDER — LIDOCAINE HCL (PF) 2 % IJ SOLN
INTRAMUSCULAR | Status: AC
Start: 2023-08-04 — End: ?
  Filled 2023-08-04: qty 5

## 2023-08-04 MED ORDER — VITAMIN D3 25 MCG (1000 UNIT) PO TABS
2000.0000 [IU] | ORAL_TABLET | Freq: Every day | ORAL | Status: DC
  Administered 2023-08-05 – 2023-08-07 (×3): 2000 [IU] via ORAL
  Filled 2023-08-04 (×6): qty 2

## 2023-08-04 MED ORDER — ONDANSETRON HCL 4 MG/2ML IJ SOLN
INTRAMUSCULAR | Status: AC
  Filled 2023-08-04: qty 2

## 2023-08-04 MED ORDER — BISACODYL 10 MG RE SUPP: 10.0000 mg | Freq: Every day | RECTAL | Status: DC | PRN

## 2023-08-04 MED ORDER — ASPIRIN 81 MG PO TBEC: 81.0000 mg | DELAYED_RELEASE_TABLET | Freq: Every day | ORAL | Status: DC

## 2023-08-04 MED ORDER — ENOXAPARIN SODIUM 30 MG/0.3ML IJ SOSY
30.0000 mg | PREFILLED_SYRINGE | INTRAMUSCULAR | Status: DC
  Administered 2023-08-05: 30 mg via SUBCUTANEOUS
  Filled 2023-08-04: qty 0.3

## 2023-08-04 MED ORDER — SODIUM CHLORIDE 0.45 % IV SOLN: INTRAVENOUS | Status: DC

## 2023-08-04 MED ORDER — BUPIVACAINE-EPINEPHRINE (PF) 0.25% -1:200000 IJ SOLN
INTRAMUSCULAR | Status: AC
Start: 2023-08-04 — End: ?
  Filled 2023-08-04: qty 60

## 2023-08-04 MED ORDER — CHLORHEXIDINE GLUCONATE 4 % EX SOLN: Freq: Once | CUTANEOUS | Status: DC

## 2023-08-04 MED ORDER — MAGNESIUM HYDROXIDE 400 MG/5ML PO SUSP: 30.0000 mL | Freq: Every day | ORAL | Status: DC | PRN

## 2023-08-04 MED ORDER — SODIUM CHLORIDE 0.9 % IV SOLN: 0.0000 ug/min | INTRAVENOUS | Status: DC

## 2023-08-04 MED ORDER — MORPHINE SULFATE (PF) 2 MG/ML IV SOLN: 0.5000 mg | INTRAVENOUS | Status: DC | PRN

## 2023-08-04 MED ORDER — METOCLOPRAMIDE HCL 5 MG PO TABS: 5.0000 mg | ORAL_TABLET | Freq: Three times a day (TID) | ORAL | Status: DC | PRN

## 2023-08-04 MED ORDER — MELATONIN 5 MG PO TABS: 2.5000 mg | ORAL_TABLET | Freq: Every evening | ORAL | Status: DC | PRN

## 2023-08-04 MED ORDER — OXYCODONE HCL 5 MG PO TABS: 5.0000 mg | ORAL_TABLET | Freq: Four times a day (QID) | ORAL | Status: DC | PRN

## 2023-08-04 MED ORDER — PHENOL 1.4 % MT LIQD: 1.0000 | OROMUCOSAL | Status: DC | PRN

## 2023-08-04 MED ORDER — HYDROCODONE-ACETAMINOPHEN 5-325 MG PO TABS
1.0000 | ORAL_TABLET | ORAL | Status: DC | PRN
  Administered 2023-08-07: 1 via ORAL
  Filled 2023-08-04: qty 1

## 2023-08-04 MED ORDER — FERROUS SULFATE 325 (65 FE) MG PO TABS
325.0000 mg | ORAL_TABLET | Freq: Every day | ORAL | Status: DC
  Administered 2023-08-05 – 2023-08-07 (×3): 325 mg via ORAL
  Filled 2023-08-04 (×3): qty 1

## 2023-08-04 MED ORDER — FENTANYL CITRATE (PF) 100 MCG/2ML IJ SOLN
INTRAMUSCULAR | Status: AC
  Filled 2023-08-04: qty 2

## 2023-08-04 MED ORDER — PHENYLEPHRINE HCL-NACL 20-0.9 MG/250ML-% IV SOLN
INTRAVENOUS | Status: AC
  Filled 2023-08-04: qty 250

## 2023-08-04 MED ORDER — ALUM & MAG HYDROXIDE-SIMETH 200-200-20 MG/5ML PO SUSP: 30.0000 mL | ORAL | Status: DC | PRN

## 2023-08-04 MED ORDER — BUPIVACAINE-EPINEPHRINE (PF) 0.25% -1:200000 IJ SOLN
INTRAMUSCULAR | Status: DC | PRN
  Administered 2023-08-04: 45 mL via PERINEURAL

## 2023-08-04 SURGICAL SUPPLY — 39 items
BIT DRILL CALIBRATED 4.2 (BIT) IMPLANT
BIT DRILL CANN 16 HIP (BIT) IMPLANT
BIT DRILL CANN STP 6/9 HIP (BIT) IMPLANT
BIT DRILL TAPERED 10 (BIT) IMPLANT
BLADE TFNA HELICAL 85 (Anchor) IMPLANT
BNDG COHESIVE 4X5 TAN STRL LF (GAUZE/BANDAGES/DRESSINGS) ×1 IMPLANT
CHLORAPREP W/TINT 26 (MISCELLANEOUS) ×2 IMPLANT
DRAPE C-ARMOR (DRAPES) IMPLANT
DRAPE INCISE 23X17 STRL (DRAPES) ×1 IMPLANT
DRAPE INCISE IOBAN 23X17 STRL (DRAPES) ×1 IMPLANT
DRILL BIT CALIBRATED 4.2 (BIT) ×1 IMPLANT
ELECT REM PT RETURN 9FT ADLT (ELECTROSURGICAL) ×1 IMPLANT
ELECTRODE REM PT RTRN 9FT ADLT (ELECTROSURGICAL) ×1 IMPLANT
GAUZE SPONGE 4X4 12PLY STRL (GAUZE/BANDAGES/DRESSINGS) ×2 IMPLANT
GLOVE INDICATOR 8.0 STRL GRN (GLOVE) ×1 IMPLANT
GLOVE SURG ORTHO 8.5 STRL (GLOVE) ×1 IMPLANT
GOWN STRL REUS W/ TWL LRG LVL3 (GOWN DISPOSABLE) ×1 IMPLANT
GOWN STRL REUS W/TWL LRG LVL4 (GOWN DISPOSABLE) ×1 IMPLANT
GUIDEWIRE 3.2X400 (WIRE) IMPLANT
KIT TURNOVER KIT A (KITS) ×1 IMPLANT
MANIFOLD NEPTUNE II (INSTRUMENTS) ×1 IMPLANT
MAT ABSORB FLUID 56X50 GRAY (MISCELLANEOUS) ×1 IMPLANT
NAIL TROCH FIX 10X170 130 (Nail) IMPLANT
NDL SPNL 18GX3.5 QUINCKE PK (NEEDLE) ×1 IMPLANT
NEEDLE SPNL 18GX3.5 QUINCKE PK (NEEDLE) ×1 IMPLANT
NS IRRIG 500ML POUR BTL (IV SOLUTION) ×1 IMPLANT
PACK HIP COMPR (MISCELLANEOUS) ×1 IMPLANT
PAD ABD DERMACEA PRESS 5X9 (GAUZE/BANDAGES/DRESSINGS) ×2 IMPLANT
SCREW LOCK STAR 5X32 (Screw) IMPLANT
SOL PREP PVP 2OZ (MISCELLANEOUS) ×1 IMPLANT
SOLUTION PREP PVP 2OZ (MISCELLANEOUS) ×1 IMPLANT
STAPLER SKIN PROX 35W (STAPLE) ×1 IMPLANT
SUCTION TUBE FRAZIER 10FR DISP (SUCTIONS) ×1 IMPLANT
SUT VIC AB 0 CT1 36 (SUTURE) ×1 IMPLANT
SUT VIC AB 2-0 CT1 (SUTURE) ×1 IMPLANT
SYR 30ML LL (SYRINGE) ×1 IMPLANT
TRAP FLUID SMOKE EVACUATOR (MISCELLANEOUS) ×1 IMPLANT
WATER STERILE IRR 1000ML POUR (IV SOLUTION) IMPLANT
WATER STERILE IRR 500ML POUR (IV SOLUTION) ×1 IMPLANT

## 2023-08-04 NOTE — Progress Notes (Addendum)
 Addendum @2107 : I spoke with Guillermo Lees, NP from the ICU regarding this patient. I feel she would benefit from intensive care-level monitoring and rate control of her atrial fibrillation given this patient's persistent hypotension and current pressor dependence. I also informed the two daughters of these updates.   Called to PACU for persistent hypotension. Patient is awake and alert, though confused as her baseline. Does not appear in any distress.  Patient is in Afib with rapid ventricular response, as per pre-op. Being given phenylephrine support while awaiting phenylephrine infusion.  Patient spontaneously converted to NSR briefly, and we were able to capture an EKG at this time (though sadly it appears it did not pull into Epic). No apparent ST changes. During this brief period of sinus rhythm, patient's blood pressure normalized to 120s systolic. Patient went right back into atrial fibrillation however, with continued hypotension.  I am hesitant to give any AV nodal blockers given patient's current hypotension. She is not in distress and does not warrant synchronized cardioversion at this time. Will adjust phenylephrine infusion for vasopressor support and contact main hospitalist team for potential step-down admission.

## 2023-08-04 NOTE — H&P (Signed)
THE PATIENT WAS SEEN PRIOR TO SURGERY TODAY.  HISTORY, ALLERGIES, HOME MEDICATIONS AND OPERATIVE PROCEDURE WERE REVIEWED. RISKS AND BENEFITS OF SURGERY DISCUSSED WITH PATIENT AGAIN.  NO CHANGES FROM INITIAL HISTORY AND PHYSICAL NOTED.    

## 2023-08-04 NOTE — Assessment & Plan Note (Signed)
 Patient is presenting with an unwitnessed fall complicated by a left femur fracture.  Given patient's prior history of falls and generalized weakness, fall likely mechanical in nature.  - Orthopedic surgery consulted; appreciate their recommendations - N.p.o. - Pain control with Tylenol, oxycodone and Dilaudid - Continuous pulse oximetry

## 2023-08-04 NOTE — Assessment & Plan Note (Addendum)
 Very minimal AKI with creatinine of 1.23.  Likely due to underlying dementia and associated poor p.o. intake complicated by chronic diuretic use.  - S/p 500 cc bolus - Hold further fluids given history of recurrent acute HF - Bladder scan - Repeat BMP in the a.m. - Hold nephrotoxic agents

## 2023-08-04 NOTE — Assessment & Plan Note (Signed)
 Continue home midodrine

## 2023-08-04 NOTE — ED Notes (Signed)
Report given to OR nurse at this time.

## 2023-08-04 NOTE — ED Triage Notes (Addendum)
 Coming from [facility]. Pt was sitting in a recliner and was trying to get up without help and fell. Fall was unwitnessed. She landed on her left side.Tenderness to the left arm. Patient has Hx of broken femur to the right hip. DNR and MOST form. 18G LAC. Obvious shortening and outward rotation to the LEFT leg. CBG was 450. Takes Asprin and has CHF.  AOX3.  104/56, ET26, 94%RA, 60

## 2023-08-04 NOTE — Progress Notes (Signed)
 Patient have had HCG wipe down, and consent have be sign by daughter Dorthy Gavia) and nurse

## 2023-08-04 NOTE — Anesthesia Procedure Notes (Signed)
 Procedure Name: LMA Insertion Date/Time: 08/04/2023 6:54 PM  Performed by: Delice Felt, CRNAPre-anesthesia Checklist: Patient identified, Patient being monitored, Timeout performed, Emergency Drugs available and Suction available Patient Re-evaluated:Patient Re-evaluated prior to induction Oxygen Delivery Method: Circle system utilized Preoxygenation: Pre-oxygenation with 100% oxygen Induction Type: IV induction Ventilation: Mask ventilation without difficulty LMA: LMA inserted and LMA with gastric port inserted LMA Size: 3.0 Tube type: Oral Number of attempts: 1 Placement Confirmation: positive ETCO2 and breath sounds checked- equal and bilateral Tube secured with: Tape Dental Injury: Teeth and Oropharynx as per pre-operative assessment

## 2023-08-04 NOTE — Assessment & Plan Note (Addendum)
 Reported mild dementia, however patient is a poor historian and does not recall fall earlier today.  Per family, she has a history of hospital induced delirium  - Continue home Namenda - Delirium precautions

## 2023-08-04 NOTE — Transfer of Care (Signed)
 Immediate Anesthesia Transfer of Care Note  Patient: Vanessa [name]  Procedure(s) Performed: FIXATION, FRACTURE, INTERTROCHANTERIC, WITH INTRAMEDULLARY ROD (Left: Hip)  Patient Location: PACU  Anesthesia Type:General  Level of Consciousness: drowsy  Airway & Oxygen Therapy: Patient Spontanous Breathing and Patient connected to face mask oxygen  Post-op Assessment: Report given to RN and Post -op Vital signs reviewed and stable  Post vital signs: Reviewed and stable  Last Vitals:  Vitals Value Taken Time  BP 98/79 08/04/23 2016  Temp 37.3 C 08/04/23 2014  Pulse 116 08/04/23 2017  Resp 18 08/04/23 2020  SpO2 96 % 08/04/23 2017  Vitals shown include unfiled device data.  Last Pain:  Vitals:   08/04/23 2014  TempSrc:   PainSc: 0-No pain         Complications: No notable events documented.

## 2023-08-04 NOTE — Consult Note (Incomplete)
 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   ED Course: Initial vital signs showed HR of 61 beats/minute, BP 115/7mm Hg, the RR 20 breaths/minute, and the oxygen saturation 74% on RA initially then placed on 4L with improvemetn to 100% and a temperature of 98.53F (37.1C).  Pertinent Labs/Diagnostics Findings: Na+/ K+: 138/4.0.  Glucose: 230 BUN/Cr.:  21/1.23  Unremarkable CBC troponin:65 CXR> no active disease.  X-ray of right hip> shows intertrochanteric fracture of the left femoral neck in addition to lucency in the left inferior pubic ramus. Disposition: [facility] service consulted for admission.  Ortho surgery consulted and patient taken to the OR for left hip repair.  S/p left hip repair patient went into A-fib RVR and developed hypotension requiring vasopressors in the PACU.  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  Postop went into A-fib with RVR and became hypotensive requiring pressors.  PCCM consulted  Consults:  PCCM Ortho Cardiology  Procedures:  4/13:ORIF left hip with  trochanteric fixation nail   Significant Diagnostic Tests:  4/13: Chest Xray> IMPRESSION: 1. No acute cardiopulmonary process. 2. Large hiatal hernia.  4/13:Xray of Left Hip> IMPRESSION: Intertrochanteric fracture of the LEFT femoral neck. Lucency in the LEFT inferior pubic ramus. Potential aggressive osseous lesion.  Interim History / Subjective:      Micro Data:  4/13: MRSA PCR>>   Antimicrobials:  None  OBJECTIVE  Blood pressure (!) 69/52, pulse (!) 125, temperature 99.2 F (37.3 C), resp. rate 13, height 5\' 4"  (1.626 m), weight 56.3 kg, SpO2 92%.        Intake/Output Summary (Last 24 hours) at 08/04/2023 2118 Last data filed at 08/04/2023 1959 Gross per 24 hour  Intake 800 ml  Output 50 ml  Net 750 ml   Filed Weights   08/04/23 1207  Weight: 56.3 kg     Physical Examination  GENERAL: 89 year-old critically ill patient lying in the bed in no acute distress EYES: PEERLA. No scleral icterus. Extraocular muscles intact.  HEENT: Head atraumatic, normocephalic. Oropharynx and nasopharynx clear.  NECK:  No JVD, supple  LUNGS: Normal breath sounds bilaterally.  No use of accessory muscles of respiration.  CARDIOVASCULAR: S1, S2 normal. No murmurs, rubs, or gallops.  ABDOMEN: Soft, NTND EXTREMITIES: Mild swelling on the left hip without erythema.  Capillary refill < 3 seconds in all extremities. Pulses palpable distally. NEUROLOGIC: The patient is oriented to self . No focal neurological deficit appreciated. Cranial nerves are intact.  SKIN: No obvious rash, lesion, or ulcer. Warm to touch Labs/imaging that I havepersonally reviewed  (right click and "Reselect all SmartList Selections" daily)     Labs   CBC: Recent Labs  Lab 08/04/23  1312  WBC 8.8  HGB 14.2  HCT 44.7  MCV 90.3  PLT 276    Basic Metabolic Panel: Recent Labs  Lab 08/04/23 1312  NA 138  K 4.0  CL 98  CO2 28  GLUCOSE 230*  BUN 21  CREATININE 1.23*  CALCIUM 9.2   GFR: Estimated  Creatinine Clearance: 28.4 mL/min (A) (by C-G formula based on SCr of 1.23 mg/dL (H)). Recent Labs  Lab 08/04/23 1312  WBC 8.8    Liver Function Tests: No results for input(s): "AST", "ALT", "ALKPHOS", "BILITOT", "PROT", "ALBUMIN" in the last 168 hours. No results for input(s): "LIPASE", "AMYLASE" in the last 168 hours. No results for input(s): "AMMONIA" in the last 168 hours.  ABG No results found for: "PHART", "PCO2ART", "PO2ART", "HCO3", "TCO2", "ACIDBASEDEF", "O2SAT"   Coagulation Profile: Recent Labs  Lab 08/04/23 1705  INR 1.1    Cardiac Enzymes: No results for input(s): "CKTOTAL", "CKMB", "CKMBINDEX", "TROPONINI" in the last 168 hours.  HbA1C: Hemoglobin A1C  Date/Time Value Ref Range Status  07/11/2023 12:00 AM 8.3  Final   Hgb A1c MFr Bld  Date/Time Value Ref Range Status  02/07/2023 08:14 AM 7.7 (H) 4.8 - 5.6 % Final    Comment:    (NOTE) Pre diabetes:          5.7%-6.4%  Diabetes:              >6.4%  Glycemic control for   <7.0% adults with diabetes   08/03/2022 06:49 PM 6.5 (H) 4.8 - 5.6 % Final    Comment:    (NOTE) Pre diabetes:          5.7%-6.4%  Diabetes:              >6.4%  Glycemic control for   <7.0% adults with diabetes     CBG: Recent Labs  Lab 08/04/23 1214 08/04/23 2026  GLUCAP 253* 232*    Review of Systems:   Unable to be obtained secondary to the patient's hx of dementia.    Past Medical History  She,  has a past medical history of Anemia, Arthritis, Cataract, Chronic systolic CHF (congestive heart failure) (HCC), COPD (chronic obstructive pulmonary disease) (HCC), Family history of adverse reaction to anesthesia, Glaucoma, History of pelvic fracture, Hyperglycemia, Hyperlipidemia, Hypothyroidism, Insomnia, Migraines, Osteopenia, PAF (paroxysmal atrial fibrillation) (HCC), Pulmonary hypertension (HCC), and Wears dentures.   Surgical History    Past Surgical History:  Procedure Laterality Date   CATARACT EXTRACTION      CATARACT EXTRACTION W/PHACO Left 08/27/2016   Procedure: CATARACT EXTRACTION PHACO AND INTRAOCULAR LENS PLACEMENT (IOC)  Left;  Surgeon: [name], MD;  Location: [facility];  Service: Ophthalmology;  Laterality: Left;   COLONOSCOPY  2012   INTRAMEDULLARY (IM) NAIL INTERTROCHANTERIC Right 02/07/2023   Procedure: INTRAMEDULLARY (IM) NAIL INTERTROCHANTERIC;  Surgeon: Elner Hahn, MD;  Location: [facility];  Service: Orthopedics;  Laterality: Right;   REFRACTIVE SURGERY       Social History   reports that she has quit smoking. Her smoking use included cigarettes. She has a 12.5 pack-year smoking history. She has never used smokeless tobacco. She reports that she does not drink alcohol and does not use drugs.   Family History   Her family history includes Diabetes in her father; Migraines in her mother; Stroke in her mother. There is no history of Breast cancer or Colon cancer.   Allergies Allergies  Allergen Reactions   Codeine     Doesn't remember reaction  Cortisone     Doesn't remember reaction   Decongestant [Pseudoephedrine Hcl] Other (See Comments)    Avoid decongestants due to glaucoma hx.     Donepezil     Prolonged Qt   Naproxen     Stomach issues   Simvastatin     aches   Statins Other (See Comments)    Aches      Home Medications  Prior to Admission medications   Medication Sig Start Date End Date Taking? Authorizing Provider  acetaminophen (TYLENOL) 325 MG tablet Take 1-2 tablets (325-650 mg total) by mouth every 6 (six) hours as needed for mild pain (pain score 1-3) (pain score 1-3 or temp > 100.5). 02/14/23  Yes Coralyn Derry, PA-C  aspirin EC 81 MG tablet Take 81 mg by mouth daily.   Yes [provider]  Cholecalciferol (VITAMIN D) 50 MCG (2000 UT) CAPS Take 1 capsule (2,000 Units total) by mouth daily. 10/03/18  Yes Donnie Galea, MD  empagliflozin (JARDIANCE) 10 MG TABS tablet Take 10 mg by mouth daily.   Yes [provider]  levothyroxine (SYNTHROID) 25 MCG tablet TAKE 1 TABLET BY MOUTH ONCE DAILY ON AN EMPTY STOMACH. WAIT 30 MINUTES BEFORE TAKING OTHER MEDS. 01/04/23  Yes Donnie Galea, MD  melatonin 5 MG TABS Take 2.5 mg by mouth at bedtime as needed.   Yes [provider]  memantine (NAMENDA) 5 MG tablet Take 5 mg by mouth 2 (two) times daily. 05/01/23  Yes [provider]  metoprolol succinate (TOPROL-XL) 25 MG 24 hr tablet Take 12.5 mg by mouth daily.   Yes [provider]  midodrine (PROAMATINE) 10 MG tablet Take 10 mg by mouth 3 (three) times daily.   Yes [provider]  pantoprazole (PROTONIX) 20 MG tablet Take 20 mg by mouth daily. 11/27/22 11/27/23 Yes [provider]  polyethylene glycol (MIRALAX / GLYCOLAX) 17 g packet Take 17 g by mouth daily. 02/15/23  Yes Agbata, Tochukwu, MD  Potassium 99 MG TABS Take 2 tablets (198 mg total) by mouth daily. 08/10/22  Yes Donnie Galea, MD  senna-docusate (SENOKOT-S) 8.6-50 MG tablet Take 1 tablet by mouth 2 (two) times daily as needed for mild constipation.   Yes [provider]  torsemide (DEMADEX) 10 MG tablet Take 10 mg by mouth daily.   Yes [provider]  Zinc Oxide (TRIPLE PASTE) 40 % PSTE Apply 1 Application topically as needed (to buttocks and sacrum).   Yes [provider]  feeding supplement (ENSURE ENLIVE / ENSURE PLUS) LIQD Take 237 mLs by mouth 2 (two) times daily between meals. 02/15/23   Read Camel, MD  Scheduled Meds:  celecoxib  200 mg Oral BID   Chlorhexidine Gluconate Cloth  6 each Topical Daily   cholecalciferol  2,000 Units Oral Daily   enoxaparin (LOVENOX) injection  30 mg Subcutaneous Q24H   ferrous sulfate  325 mg Oral Q breakfast   gabapentin  200 mg Oral BID   levothyroxine  25 mcg Oral Q0600   midodrine  10 mg Oral TID AC   senna  1 tablet Oral BID   Continuous Infusions:  sodium chloride 75 mL/hr at 08/04/23 2124    ceFAZolin (ANCEF) IV      phenylephrine (NEO-SYNEPHRINE) Adult infusion 90 mcg/min (08/05/23 0035)   PRN Meds:.acetaminophen, alum & mag hydroxide-simeth, bisacodyl, HYDROcodone-acetaminophen, ipratropium-albuterol, magnesium hydroxide, menthol-cetylpyridinium **OR** phenol, metoCLOPramide **OR** metoCLOPramide (REGLAN) injection, morphine injection, sodium phosphate, zolpidem   Active Hospital Problem  list   See systems below  Assessment & Plan:  #Left Intertrochanteric fracture s/p ORIF left hip with trochanteric fixation nail 08/04/23 Hx of Recurrent Falls, Right Intertrochanteric Femur Fracture s/p repair 02/07/23 -Control pain as needed -DVT ppx on hold due to potential acute blood loss anemia drop in hgb from 14 to 11 -SCD's -Ortho following for further recs  #Leukocytosis ?Sepsis vs reactive post op Hypotensive with Lactic acidosis unclear if related to true sepsis, afib with RVR  or acute blood loss anemia post op. Now with drop in hgb from 14.2 to 11.8 -Obtain cultures, trend lactic/ PCT -Daily CBC, monitor WBC/ fever curve -hold further IVF resuscitation due to hx of chf -Continue cefazolin for now -Peripheral vasopressors to maintain MAP> 65 -Monitor for S/Sx of bleeding -Trend CBC (H&H q6h) -SCD's for VTE Prophylaxis (chemical ppx contraindicated) -Transfuse for Hgb <7     #Acute Kidney Injury  Baseline Cr: 1.1, Cr now 1.47 GFR 35 -Monitor I&O's / urinary output -Follow BMP -Ensure adequate renal perfusion -Avoid nephrotoxic agents as able -Replace electrolytes as indicated  #Chronic HFpEF #Hx of Afib/Aflutter/SVT on Flecanide now in Afib with RVR #Mildly Elevated troponin likely demand PMHx: HTN, HLD Last Echo LVEF 02/08/23 > 45-50%,  -BNP pending -trend troponin -Echocardiogram ordered -hold outpatient lopressor due to hypotension, consider restarting as patient stabilizes -Hold anticoagulation -Hold GMDT in the setting of hypotension -Cardiology consult if appropriate  #COPD- No  signs of exacerbation - patient with hx of COPD uses home oxygen at 2L intermittently -Supplemental O2, goal sat 88-92% -Bronchodilators (albuterol/ipratropium) standing and PRN   #T2DM -Check HgbA1c  -CBG's q4; Target range of 140 to 180 -SSI -Follow ICU Hypo/Hyperglycemia protocol  #Hypothyroidism -Check TSH and Free T4 -Continue home meds  Best practice:  Diet:  NPO Pain/Anxiety/Delirium protocol (if indicated): No VAP protocol (if indicated): Not indicated DVT prophylaxis: Contraindicated GI prophylaxis: H2B Glucose control:  SSI Yes Central venous access:  N/A Arterial line:  N/A Foley:  N/A Mobility:  bed rest  PT consulted: N/A Last date of multidisciplinary goals of care discussion [updated at the bedside] Code Status:  DNR Disposition: ICU   = Goals of Care = Code Status Order: DNR  Primary Emergency Contact: [name],[name], Home Phone: [phone] Patient wishes to pursue ongoing treatment, but concurred that if deteriorated to pulselessness, patient would prefer a natural death as opposed to invasive measures such as CPR and intubation.  Family concurs  Critical care time: 43 minutes        Alonza Arthurs DNP, CCRN, FNP-C, AGACNP-BC Acute Care & Family Nurse Practitioner [facility] PCCM on call pager [phone]

## 2023-08-04 NOTE — Assessment & Plan Note (Signed)
 Continue home Synthroid

## 2023-08-04 NOTE — Assessment & Plan Note (Signed)
 No wheezing on examination.  No reported shortness of breath or cough.  - DuoNebs as needed - Continue home bronchodilators

## 2023-08-04 NOTE — ED Notes (Signed)
 Provided patient with warm blankets.

## 2023-08-04 NOTE — Assessment & Plan Note (Signed)
 History of moderately reduced EF as low as 40-45% with recovery to 45-50% when checked in October 2024.  She appears euvolemic at this time.  - Daily weights - Strict and out - Hold home diuretics today - Resume tomorrow if improvement in renal function and p.o. intake

## 2023-08-04 NOTE — H&P (Signed)
 History and Physical    Patient: Vanessa [name]:[id number] DOB: 02/21/1935 DOA: 08/04/2023 DOS: the patient was seen and examined on 08/04/2023 PCP: [name], MD  Patient coming from: SNF  Chief Complaint:  Chief Complaint  Patient presents with   Hip Injury    left   HPI: [name] is a 89 y.o. female with medical history significant of Chronic systolic and diastolic CHF, COPD, SVT, type 2 diabetes, hypothyroidism, atrial fibrillation not on AC, chronic hypotension on midodrine, who presents to the ED due to unwitnessed fall.  [name] states she cannot recall exactly what happened with the fall.  Per chart review, she was sitting in a recliner and tried to get up without help and fell forward landing on her left side.  Due to this, EMS was called.  At this time, [name] endorses left hip pain but otherwise denies chest pain, shortness of breath, dizziness, headache, nausea, vomiting.  ED course: On arrival to the ED, patient was normotensive at 115/73 with heart rate of 120.  She was saturating at 94% on room air.  She was afebrile.  Initial workup notable for unremarkable CBC, creatinine 1.23, GFR 43, and troponin of 65.  Chest x-ray with no active disease.  Hip x-ray with intertrochanteric fracture of the left femoral neck, in addition to lucency in the left inferior pubic ramus.  Orthopedic surgery consulted and [facility] contacted for admission.   Review of Systems: As mentioned in the history of present illness. All other systems reviewed and are negative.  Past Medical History:  Diagnosis Date   Anemia    treated ~2009 with iron, resolved   Arthritis    Cataract    surgery on R catarct 2012   Chronic systolic CHF (congestive heart failure) (HCC)    a. TTE 2012: EF of 45-50%, mild diffuse HK, trivial AI, mild MR, mildly dilated RV with mild reduction of RVSF, mild to moderate TR, mild to moderate PR, mildly elevated PASP   COPD (chronic obstructive  pulmonary disease) (HCC)    Family history of adverse reaction to anesthesia    Mother - altered mental status (long term)   Glaucoma    History of pelvic fracture    Hyperglycemia    mild inc in fasting sugar   Hyperlipidemia    Hypothyroidism    Insomnia    Migraines    without aura, sx since age 70   Osteopenia    prev with 10 years of fosamax and intolerant of evista   PAF (paroxysmal atrial fibrillation) (HCC)    a. initially noted 2012; b. CHADS2VASc at least 5 (CHF, age x 2, vasacular disease, female)   Pulmonary hypertension (HCC)    Wears dentures    full upper and lower   Past Surgical History:  Procedure Laterality Date   CATARACT EXTRACTION     CATARACT EXTRACTION W/PHACO Left 08/27/2016   Procedure: CATARACT EXTRACTION PHACO AND INTRAOCULAR LENS PLACEMENT (IOC)  Left;  Surgeon: [name], MD;  Location: [facility];  Service: Ophthalmology;  Laterality: Left;   COLONOSCOPY  2012   INTRAMEDULLARY (IM) NAIL INTERTROCHANTERIC Right 02/07/2023   Procedure: INTRAMEDULLARY (IM) NAIL INTERTROCHANTERIC;  Surgeon: Elner Hahn, MD;  Location: [facility];  Service: Orthopedics;  Laterality: Right;   REFRACTIVE SURGERY     Social History:  reports that she has quit smoking. Her smoking use included cigarettes. She has a 12.5 pack-year smoking history. She has never used smokeless tobacco. She  reports that she does not drink alcohol and does not use drugs.  Allergies  Allergen Reactions   Codeine     Doesn't remember reaction   Cortisone     Doesn't remember reaction   Decongestant [Pseudoephedrine Hcl] Other (See Comments)    Avoid decongestants due to glaucoma hx.     Donepezil     Prolonged Qt   Naproxen     Stomach issues   Simvastatin     aches   Statins Other (See Comments)    Aches     Family History  Problem Relation Age of Onset   Diabetes Father    Migraines Mother    Stroke Mother        TIAs   Breast cancer Neg Hx    Colon cancer  Neg Hx     Prior to Admission medications   Medication Sig Start Date End Date Taking? Authorizing Provider  acetaminophen (TYLENOL) 325 MG tablet Take 1-2 tablets (325-650 mg total) by mouth every 6 (six) hours as needed for mild pain (pain score 1-3) (pain score 1-3 or temp > 100.5). 02/14/23   Coralyn Derry, PA-C  aspirin EC 81 MG tablet Take 81 mg by mouth daily.    [provider]  Cholecalciferol (VITAMIN D) 50 MCG (2000 UT) CAPS Take 1 capsule (2,000 Units total) by mouth daily. 10/03/18   Donnie Galea, MD  empagliflozin (JARDIANCE) 10 MG TABS tablet Take 10 mg by mouth daily.    [provider]  feeding supplement (ENSURE ENLIVE / ENSURE PLUS) LIQD Take 237 mLs by mouth 2 (two) times daily between meals. 02/15/23   Read Camel, MD  levothyroxine (SYNTHROID) 25 MCG tablet TAKE 1 TABLET BY MOUTH ONCE DAILY ON AN EMPTY STOMACH. WAIT 30 MINUTES BEFORE TAKING OTHER MEDS. 01/04/23   Donnie Galea, MD  melatonin 5 MG TABS Take 2.5 mg by mouth at bedtime as needed.    [provider]  memantine (NAMENDA) 5 MG tablet Take 5 mg by mouth 2 (two) times daily. 05/01/23   [provider]  metoprolol succinate (TOPROL-XL) 25 MG 24 hr tablet Take 12.5 mg by mouth daily.    [provider]  midodrine (PROAMATINE) 10 MG tablet Take 10 mg by mouth 3 (three) times daily.    [provider]  pantoprazole (PROTONIX) 20 MG tablet Take 20 mg by mouth daily. 11/27/22 11/27/23  [provider]  polyethylene glycol (MIRALAX / GLYCOLAX) 17 g packet Take 17 g by mouth daily. 02/15/23   Read Camel, MD  Potassium 99 MG TABS Take 2 tablets (198 mg total) by mouth daily. 08/10/22   Donnie Galea, MD  senna-docusate (SENOKOT-S) 8.6-50 MG tablet Take 1 tablet by mouth 2 (two) times daily as needed for mild constipation.    [provider]  torsemide (DEMADEX) 10 MG tablet Take 10 mg by mouth daily.    [provider]  Zinc Oxide  (TRIPLE PASTE) 40 % PSTE Apply 1 Application topically as needed (to buttocks and sacrum).    [provider]    Physical Exam: Vitals:   08/04/23 1207 08/04/23 1400 08/04/23 1530 08/04/23 1545  BP:  132/69 (!) 134/106 (!) 139/102  Pulse:  63 (!) 125 (!) 133  Resp:  19 (!) 30 15  Temp:      SpO2:  100% 93% 95%  Weight: 56.3 kg     Height: 5\' 4"  (1.626 m)      Physical  Exam Vitals and nursing note reviewed.  Constitutional:      General: She is not in acute distress.    Appearance: She is normal weight. She is not toxic-appearing.  HENT:     Head: Normocephalic and atraumatic.     Mouth/Throat:     Mouth: Mucous membranes are moist.     Pharynx: Oropharynx is clear.  Eyes:     Conjunctiva/sclera: Conjunctivae normal.     Pupils: Pupils are equal, round, and reactive to light.  Cardiovascular:     Rate and Rhythm: Regular rhythm. Bradycardia present.     Heart sounds: No murmur heard.    No gallop.  Pulmonary:     Effort: Pulmonary effort is normal. No respiratory distress.     Breath sounds: Normal breath sounds. No wheezing, rhonchi or rales.  Abdominal:     General: Bowel sounds are normal. There is no distension.     Palpations: Abdomen is soft.     Tenderness: There is no abdominal tenderness. There is no guarding.  Musculoskeletal:     Right lower leg: No edema.     Left lower leg: No edema.     Comments: Left lower extremity externally rotated and shortened.  Tender to palpation  Skin:    General: Skin is warm and dry.  Neurological:     Mental Status: She is alert.     Comments: Patient is alert and oriented to self, family members at bedside.  Psychiatric:        Mood and Affect: Mood normal.        Behavior: Behavior normal.    Data Reviewed: CBC with WBC of 8.8, hemoglobin of 14.2, platelets of 276 BMP with sodium of 138, potassium 4.0, bicarb 28, glucose 230, BUN 21, creatinine 1.23 with GFR 43 Troponin 65  Initial EKG personally reviewed.   Sinus rhythm with rate of 60.  Borderline first-degree AV block.  Repeat EKG personally reviewed.  Narrow complex tachycardia with rate of 125.  Difficult to assess for P waves, but concerning for atrial fibrillation with RVR.  DG Chest Portable 1 View Result Date: 08/04/2023 CLINICAL DATA:  Pain, LEFT hip fall. EXAM: PORTABLE CHEST 1 VIEW COMPARISON:  Radiograph 02/09/2023 FINDINGS: Large hiatal hernia posterior to the heart. Entire stomach is above the hemidiaphragm. No pulmonary edema or pleural fluid. No pneumothorax. No acute osseous abnormality. IMPRESSION: 1. No acute cardiopulmonary process. 2. Large hiatal hernia. Electronically Signed   By: Deboraha Fallow M.D.   On: 08/04/2023 13:11   DG Hip Unilat With Pelvis 2-3 Views Left Result Date: 08/04/2023 CLINICAL DATA:  Fall, LEFT hip pain EXAM: DG HIP (WITH OR WITHOUT PELVIS) 2-3V LEFT COMPARISON:  None Available. FINDINGS: Intertrochanteric fracture of the LEFT femoral neck. Mild caudad migration of the femur. No angulation. Avulsion of the lesser trochanter. Lucency in the LEFT inferior pubic ramus. Potential aggressive osseous lesion. Internal fixation of the RIGHT hip. IMPRESSION: Intertrochanteric fracture of the LEFT femoral neck. Lucency in the LEFT inferior pubic ramus. Potential aggressive osseous lesion. Electronically Signed   By: Deboraha Fallow M.D.   On: 08/04/2023 13:09   Results are pending, will review when available.  Assessment and Plan:  * Femur fracture [facility]) Patient is presenting with an unwitnessed fall complicated by a left femur fracture.  Given patient's prior history of falls and generalized weakness, fall likely mechanical in nature.  - Orthopedic surgery consulted; appreciate their recommendations - N.p.o. - Pain control with Tylenol, oxycodone and Dilaudid -  Continuous pulse oximetry  Paroxysmal atrial fibrillation (HCC) Initially, patient was in sinus rhythm with borderline bradycardia but subsequently went  into atrial fibrillation with RVR, likely due to femur fracture.  She is on metoprolol at home.  She is not on anticoagulation.  - Telemetry monitoring - One-time dose of metoprolol 2.5 mg IV - Resume home metoprolol  AKI (acute kidney injury) (HCC) Very minimal AKI with creatinine of 1.23.  Likely due to underlying dementia and associated poor p.o. intake complicated by chronic diuretic use.  - S/p 500 cc bolus - Hold further fluids given history of recurrent acute HF - Bladder scan - Repeat BMP in the a.m. - Hold nephrotoxic agents  Elevated troponin Mildly elevated troponin at 65 in the absence of chest pain.  Potentially due to demand in the setting of femur fracture and paroxysmal A-fib.  - Repeat troponin pending  Diastolic CHF with preserved left ventricular function, NYHA class 2 (HCC) History of moderately reduced EF as low as 40-45% with recovery to 45-50% when checked in October 2024.  She appears euvolemic at this time.  - Daily weights - Strict and out - Hold home diuretics today - Resume tomorrow if improvement in renal function and p.o. intake  Dementia (HCC) Reported mild dementia, however patient is a poor historian and does not recall fall earlier today.  Per family, she has a history of hospital induced delirium  - Continue home Namenda - Delirium precautions  Chronic obstructive pulmonary disease (HCC) No wheezing on examination.  No reported shortness of breath or cough.  - DuoNebs as needed - Continue home bronchodilators  Hypotension - Continue home midodrine  Diabetes mellitus with hyperglycemia (HCC) Most recent A1c of 8.3% approximately 3 weeks ago, presenting with hyperglycemia  - SSI, sensitive - Hold home regimen  Hypothyroid - Continue home Synthroid  Advance Care Planning:   Code Status: Limited: Do not attempt resuscitation (DNR) -DNR-LIMITED -Do Not Intubate/DNI signed gold form at bedside  Consults: Orthopedic surgery  Family  Communication: Patient's daughter and sons updated at bedside  Severity of Illness: The appropriate patient status for this patient is INPATIENT. Inpatient status is judged to be reasonable and necessary in order to provide the required intensity of service to ensure the patient's safety. The patient's presenting symptoms, physical exam findings, and initial radiographic and laboratory data in the context of their chronic comorbidities is felt to place them at high risk for further clinical deterioration. Furthermore, it is not anticipated that the patient will be medically stable for discharge from the hospital within 2 midnights of admission.   * I certify that at the point of admission it is my clinical judgment that the patient will require inpatient hospital care spanning beyond 2 midnights from the point of admission due to high intensity of service, high risk for further deterioration and high frequency of surveillance required.*  Author: Avi Body, MD 08/04/2023 3:48 PM  For on call review [email or web address].

## 2023-08-04 NOTE — Assessment & Plan Note (Signed)
 Initially, patient was in sinus rhythm with borderline bradycardia but subsequently went into atrial fibrillation with RVR, likely due to femur fracture.  She is on metoprolol at home.  She is not on anticoagulation.  - Telemetry monitoring - One-time dose of metoprolol 2.5 mg IV - Resume home metoprolol

## 2023-08-04 NOTE — ED Provider Notes (Signed)
 [facility] Provider Note    Event Date/Time   First MD Initiated Contact with Patient 08/04/23 1159     (approximate)   History   Hip Injury (left)   HPI  Vanessa [name] is a 89 y.o. female past medical history significant for CHF, diabetes, prior right hip fracture, presents to the emergency department following a fall.  According to EMS patient is coming from [facility].  Patient was sitting in a recliner and trying to get up without using any help and then fell.  Fall was unwitnessed.'s they stated that she landed on her left side.  Complaining of pain to her left hip.  Patient has a DNR and MOST form in place.  Complaining of pain to her left hip.  She does not recall the events of the fall.  Denies chest pain or shortness of breath.  Denies headache or change in vision.  Denies abdominal pain, nausea, vomiting or diarrhea.  Denies dysuria, urinary urgency or frequency.     Physical Exam   Triage Vital Signs: ED Triage Vitals  Encounter Vitals Group     BP 08/04/23 1203 115/73     Systolic BP Percentile --      Diastolic BP Percentile --      Pulse Rate 08/04/23 1203 61     Resp 08/04/23 1203 20     Temp 08/04/23 1203 98.7 F (37.1 C)     Temp src --      SpO2 --      Weight 08/04/23 1207 124 lb 1.9 oz (56.3 kg)     Height 08/04/23 1207 5\' 4"  (1.626 m)     Head Circumference --      Peak Flow --      Pain Score 08/04/23 1205 4     Pain Loc --      Pain Education --      Exclude from Growth Chart --     Most recent vital signs: Vitals:   08/04/23 1203  BP: 115/73  Pulse: 61  Resp: 20  Temp: 98.7 F (37.1 C)    Physical Exam Constitutional:      Appearance: She is well-developed.  HENT:     Head: Atraumatic.  Eyes:     Extraocular Movements: Extraocular movements intact.     Conjunctiva/sclera: Conjunctivae normal.     Pupils: Pupils are equal, round, and reactive to light.  Cardiovascular:     Rate and Rhythm: Regular  rhythm.  Pulmonary:     Effort: No respiratory distress.  Abdominal:     General: There is no distension.     Tenderness: There is no abdominal tenderness. There is no right CVA tenderness, left CVA tenderness or guarding.  Musculoskeletal:     Cervical back: Normal range of motion. No tenderness.     Comments: Left leg shortened and externally rotated.  Tenderness to palpation to the left hip.  +2 DP pulses that are equal bilaterally.  Good capillary refill.  Skin:    General: Skin is warm.     Capillary Refill: Capillary refill takes less than 2 seconds.  Neurological:     Mental Status: She is alert. Mental status is at baseline.     IMPRESSION / MDM / ASSESSMENT AND PLAN / ED COURSE  I reviewed the triage vital signs and the nursing notes.  Differential diagnosis including hip fracture, dislocation, electrolyte abnormality, ACS, anemia  On chart review patient's last hospitalization was at the  end of last year where she was admitted for septic shock secondary to urinary tract infection and had ORIF to the right hip at that time.   EKG  I, Viviano Ground, the attending physician, personally viewed and interpreted this ECG.  EKG showed low voltage.  Signs of atrial enlargement.  No significant ST elevation or depression.  No findings of acute ischemia or dysrhythmia.  QTc 464.  No tachycardic or bradycardic dysrhythmias while on cardiac telemetry.  RADIOLOGY I independently reviewed imaging, my interpretation of imaging: X-ray left hip -left hip fracture  Chest x-ray  LABS (all labs ordered are listed, but only abnormal results are displayed) Labs interpreted as -    Labs Reviewed  BASIC METABOLIC PANEL WITH GFR - Abnormal; Notable for the following components:      Result Value   Glucose, Bld 230 (*)    Creatinine, Ser 1.23 (*)    GFR, Estimated 43 (*)    All other components within normal limits  CBG MONITORING, ED - Abnormal; Notable for the following components:    Glucose-Capillary 253 (*)    All other components within normal limits  TROPONIN I (HIGH SENSITIVITY) - Abnormal; Notable for the following components:   Troponin I (High Sensitivity) 65 (*)    All other components within normal limits  CBC  URINALYSIS, ROUTINE W REFLEX MICROSCOPIC     MDM  Given IV fluids and IV fentanyl  Found to have left hip fracture.  Message Dr. Annabell Key with orthopedics concern for left hip fracture.  Hyperglycemia but no concern for DKA.  No leukocytosis.  Troponin elevated at 65 which is increased from her prior, initial troponin was 14.  Possible demand ischemia.  No chest pain at this time and no ST elevation on EKG.  Consulted hospitalist for admission for left hip fracture.       PROCEDURES:  Critical Care performed: No  Procedures  Patient's presentation is most consistent with acute presentation with potential threat to life or bodily function.   MEDICATIONS ORDERED IN ED: Medications  fentaNYL (SUBLIMAZE) injection 50 mcg (50 mcg Intravenous Given 08/04/23 1238)  sodium chloride 0.9 % bolus 500 mL (500 mLs Intravenous New Bag/Given 08/04/23 1315)    FINAL CLINICAL IMPRESSION(S) / ED DIAGNOSES   Final diagnoses:  Closed fracture of left hip, initial encounter (HCC)     Rx / DC Orders   ED Discharge Orders     None        Note:  This document was prepared using Dragon voice recognition software and may include unintentional dictation errors.   Viviano Ground, MD 08/04/23 1350

## 2023-08-04 NOTE — Consult Note (Addendum)
 ORTHOPAEDIC CONSULTATION  REQUESTING PHYSICIAN: Avi Body, MD  Chief Complaint: Left hip pain  HPI: Vanessa [name] is a 89 y.o. female who complains of left hip pain following a fall at [facility] skilled nursing today.  Patient fractured her right hip October 2024 and was treated by Dr. Daun Epstein with a fixation nail.  She has been at [facility] since.  Patient complains of the left hip pain today.  She is brought to the emergency room and exam and x-rays reveal a displaced intertrochanteric fracture of the left hip.  Family is present.  Discussed surgical treatment with the with them about the patient and they agree that surgical fixation is indicated to alleviate pain and suffering.  Risk and benefits of surgery discussed with them.  At their request we will proceed later today.  Past Medical History:  Diagnosis Date   Anemia    treated ~2009 with iron, resolved   Arthritis    Cataract    surgery on R catarct 2012   Chronic systolic CHF (congestive heart failure) (HCC)    a. TTE 2012: EF of 45-50%, mild diffuse HK, trivial AI, mild MR, mildly dilated RV with mild reduction of RVSF, mild to moderate TR, mild to moderate PR, mildly elevated PASP   COPD (chronic obstructive pulmonary disease) (HCC)    Family history of adverse reaction to anesthesia    Mother - altered mental status (long term)   Glaucoma    History of pelvic fracture    Hyperglycemia    mild inc in fasting sugar   Hyperlipidemia    Hypothyroidism    Insomnia    Migraines    without aura, sx since age 38   Osteopenia    prev with 10 years of fosamax and intolerant of evista   PAF (paroxysmal atrial fibrillation) (HCC)    a. initially noted 2012; b. CHADS2VASc at least 5 (CHF, age x 2, vasacular disease, female)   Pulmonary hypertension (HCC)    Wears dentures    full upper and lower   Past Surgical History:  Procedure Laterality Date   CATARACT EXTRACTION     CATARACT EXTRACTION W/PHACO Left 08/27/2016    Procedure: CATARACT EXTRACTION PHACO AND INTRAOCULAR LENS PLACEMENT (IOC)  Left;  Surgeon: [name], MD;  Location: [facility];  Service: Ophthalmology;  Laterality: Left;   COLONOSCOPY  2012   INTRAMEDULLARY (IM) NAIL INTERTROCHANTERIC Right 02/07/2023   Procedure: INTRAMEDULLARY (IM) NAIL INTERTROCHANTERIC;  Surgeon: Elner Hahn, MD;  Location: [facility] ORS;  Service: Orthopedics;  Laterality: Right;   REFRACTIVE SURGERY     Social History   Socioeconomic History   Marital status: Widowed    Spouse name: Not on file   Number of children: Not on file   Years of education: Not on file   Highest education level: Some college, no degree  Occupational History   Not on file  Tobacco Use   Smoking status: Former    Current packs/day: 0.25    Average packs/day: 0.3 packs/day for 50.0 years (12.5 ttl pk-yrs)    Types: Cigarettes   Smokeless tobacco: Never   Tobacco comments:    started smoking in 1958- stopped 2020 after a fall  Vaping Use   Vaping status: Never Used  Substance and Sexual Activity   Alcohol use: No   Drug use: No   Sexual activity: Never  Other Topics Concern   Not on file  Social History Narrative   From [place].  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  Was married for 47 years.     Active in church and senior groups.     Likes gospel singing.     Lives alone (with her dog Vanessa [name])   4 kids, all are nearby.     Social Drivers of Corporate investment banker Strain: Low Risk  (08/10/2022)   Overall Financial Resource Strain (CARDIA)    Difficulty of Paying Living Expenses: Not very hard  Food Insecurity: No Food Insecurity (02/08/2023)   Hunger Vital Sign    Worried About Running Out of Food in the Last Year: Never true    Ran Out of Food in the Last Year: Never true  Transportation Needs: No Transportation Needs (02/08/2023)   PRAPARE - Administrator, Civil Service (Medical): No    Lack of Transportation  (Non-Medical): No  Physical Activity: Unknown (08/10/2022)   Exercise Vital Sign    Days of Exercise per Week: 0 days    Minutes of Exercise per Session: Not on file  Recent Concern: Physical Activity - Inactive (08/10/2022)   Exercise Vital Sign    Days of Exercise per Week: 0 days    Minutes of Exercise per Session: 0 min  Stress: No Stress Concern Present (08/10/2022)   [employer] of Occupational Health - Occupational Stress Questionnaire    Feeling of Stress : Not at all  Social Connections: Unknown (08/10/2022)   Social Connection and Isolation Panel [NHANES]    Frequency of Communication with Friends and Family: Patient declined    Frequency of Social Gatherings with Friends and Family: More than three times a week    Attends Religious Services: Patient declined    Database administrator or Organizations: No    Attends Banker Meetings: Not on file    Marital Status: Widowed  Recent Concern: Social Connections - Moderately Isolated (06/13/2022)   Social Connection and Isolation Panel [NHANES]    Frequency of Communication with Friends and Family: More than three times a week    Frequency of Social Gatherings with Friends and Family: More than three times a week    Attends Religious Services: More than 4 times per year    Active Member of [employer] or Organizations: No    Attends Banker Meetings: Never    Marital Status: Widowed   Family History  Problem Relation Age of Onset   Diabetes Father    Migraines Mother    Stroke Mother        TIAs   Breast cancer Neg Hx    Colon cancer Neg Hx    Allergies  Allergen Reactions   Codeine     Doesn't remember reaction   Cortisone     Doesn't remember reaction   Decongestant [Pseudoephedrine Hcl] Other (See Comments)    Avoid decongestants due to glaucoma hx.     Donepezil     Prolonged Qt   Naproxen     Stomach issues   Simvastatin     aches   Statins Other (See Comments)    Aches    Prior to  Admission medications   Medication Sig Start Date End Date Taking? Authorizing Provider  acetaminophen (TYLENOL) 325 MG tablet Take 1-2 tablets (325-650 mg total) by mouth every 6 (six) hours as needed for mild pain (pain score 1-3) (pain score 1-3 or temp > 100.5). 02/14/23  Yes Coralyn Derry, PA-C  aspirin EC 81  MG tablet Take 81 mg by mouth daily.   Yes [provider]  Cholecalciferol (VITAMIN D) 50 MCG (2000 UT) CAPS Take 1 capsule (2,000 Units total) by mouth daily. 10/03/18  Yes Donnie Galea, MD  empagliflozin (JARDIANCE) 10 MG TABS tablet Take 10 mg by mouth daily.   Yes [provider]  levothyroxine (SYNTHROID) 25 MCG tablet TAKE 1 TABLET BY MOUTH ONCE DAILY ON AN EMPTY STOMACH. WAIT 30 MINUTES BEFORE TAKING OTHER MEDS. 01/04/23  Yes Donnie Galea, MD  melatonin 5 MG TABS Take 2.5 mg by mouth at bedtime as needed.   Yes [provider]  memantine (NAMENDA) 5 MG tablet Take 5 mg by mouth 2 (two) times daily. 05/01/23  Yes [provider]  metoprolol succinate (TOPROL-XL) 25 MG 24 hr tablet Take 12.5 mg by mouth daily.   Yes [provider]  midodrine (PROAMATINE) 10 MG tablet Take 10 mg by mouth 3 (three) times daily.   Yes [provider]  pantoprazole (PROTONIX) 20 MG tablet Take 20 mg by mouth daily. 11/27/22 11/27/23 Yes [provider]  polyethylene glycol (MIRALAX / GLYCOLAX) 17 g packet Take 17 g by mouth daily. 02/15/23  Yes Agbata, Tochukwu, MD  Potassium 99 MG TABS Take 2 tablets (198 mg total) by mouth daily. 08/10/22  Yes Donnie Galea, MD  senna-docusate (SENOKOT-S) 8.6-50 MG tablet Take 1 tablet by mouth 2 (two) times daily as needed for mild constipation.   Yes [provider]  torsemide (DEMADEX) 10 MG tablet Take 10 mg by mouth daily.   Yes [provider]  Zinc Oxide (TRIPLE PASTE) 40 % PSTE Apply 1 Application topically as needed (to buttocks and sacrum).   Yes [provider]   feeding supplement (ENSURE ENLIVE / ENSURE PLUS) LIQD Take 237 mLs by mouth 2 (two) times daily between meals. 02/15/23   Read Camel, MD   DG Chest Portable 1 View Result Date: 08/04/2023 CLINICAL DATA:  Pain, LEFT hip fall. EXAM: PORTABLE CHEST 1 VIEW COMPARISON:  Radiograph 02/09/2023 FINDINGS: Large hiatal hernia posterior to the heart. Entire stomach is above the hemidiaphragm. No pulmonary edema or pleural fluid. No pneumothorax. No acute osseous abnormality. IMPRESSION: 1. No acute cardiopulmonary process. 2. Large hiatal hernia. Electronically Signed   By: Deboraha Fallow M.D.   On: 08/04/2023 13:11   DG Hip Unilat With Pelvis 2-3 Views Left Result Date: 08/04/2023 CLINICAL DATA:  Fall, LEFT hip pain EXAM: DG HIP (WITH OR WITHOUT PELVIS) 2-3V LEFT COMPARISON:  None Available. FINDINGS: Intertrochanteric fracture of the LEFT femoral neck. Mild caudad migration of the femur. No angulation. Avulsion of the lesser trochanter. Lucency in the LEFT inferior pubic ramus. Potential aggressive osseous lesion. Internal fixation of the RIGHT hip. IMPRESSION: Intertrochanteric fracture of the LEFT femoral neck. Lucency in the LEFT inferior pubic ramus. Potential aggressive osseous lesion. Electronically Signed   By: Deboraha Fallow M.D.   On: 08/04/2023 13:09    Positive ROS: All other systems have been reviewed and were otherwise negative with the exception of those mentioned in the HPI and as above.  Physical Exam: General: Alert, no acute distress Cardiovascular: No pedal edema Respiratory: No cyanosis, no use of accessory musculature GI: No organomegaly, abdomen is soft and non-tender Skin: No lesions in the area of chief complaint Neurologic: Sensation intact distally Psychiatric: Patient is competent for consent with normal mood and affect Lymphatic: No axillary or cervical lymphadenopathy  MUSCULOSKELETAL: Left leg is shortened actually rotated.  There is pain with movement.   Neurovascular status exam.  The skin is intact.  Right lower extremity is normal to exam.  Both upper extremities are normal to exam.  Head neck and spine are normal.  Patient  has been sedated with narcotics and is minimally responsive to questions.  Assessment: Displaced intertrochanteric fracture left hip  Plan: ORIF left hip with trochanteric fixation nail    Oletha Bernheim, MD [phone]   08/04/2023 4:13 PM

## 2023-08-04 NOTE — ED Notes (Signed)
 Pt is in a-fib, Md notified via secure chat, repeat EKG performed. New orders for IV metoprolol placed, med given

## 2023-08-04 NOTE — Anesthesia Preprocedure Evaluation (Signed)
 Anesthesia Evaluation  Patient identified by MRN, date of birth, ID band Patient awake and Patient confused  General Assessment Comment:Patient oriented to self, but not to place or time. Daughters at bedside  Reviewed: Allergy & Precautions, H&P , NPO status , Patient's Chart, lab work & pertinent test results  History of Anesthesia Complications Negative for: history of anesthetic complications  Airway Mallampati: II  TM Distance: >3 FB Neck ROM: full    Dental  (+) Upper Dentures, Lower Dentures   Pulmonary neg sleep apnea, COPD, Patient abstained from smoking.Not current smoker, former smoker    + decreased breath sounds      Cardiovascular Exercise Tolerance: Poor METS(-) hypertension+ Peripheral Vascular Disease and +CHF  (-) CAD and (-) Past MI + dysrhythmias Atrial Fibrillation  Rhythm:Irregular Rate:Tachycardia - Systolic murmurs Elevated troponins on admission (peaked at 60s, already decreasing), attributed to demand ischemia. Two serial EKGs without evidence of ischemia.   1. Left ventricular ejection fraction, by estimation, is 45 to 50%. Left  ventricular ejection fraction by PLAX is 49 %. The left ventricle has  mildly decreased function. The left ventricle demonstrates global  hypokinesis. Left ventricular diastolic  parameters are indeterminate.   2. Right ventricular systolic function is normal. The right ventricular  size is normal. There is normal pulmonary artery systolic pressure. The  estimated right ventricular systolic pressure is 34.8 mmHg.   3. The mitral valve is grossly normal. Trivial mitral valve  regurgitation.   4. The aortic valve is grossly normal. Aortic valve regurgitation is not  visualized. Aortic valve sclerosis/calcification is present, without any  evidence of aortic stenosis.     Neuro/Psych  Headaches PSYCHIATRIC DISORDERS     Dementia    GI/Hepatic negative GI ROS, Neg liver ROS,neg  GERD  ,,  Endo/Other  neg diabetesHypothyroidism    Renal/GU Renal disease     Musculoskeletal   Abdominal   Peds  Hematology negative hematology ROS (+)   Anesthesia Other Findings Past Medical History: No date: Anemia     Comment:  treated ~2009 with iron, resolved No date: Arthritis No date: Cataract     Comment:  surgery on R catarct 2012 No date: Chronic systolic CHF (congestive heart failure) (HCC)     Comment:  a. TTE 2012: EF of 45-50%, mild diffuse HK, trivial AI,               mild MR, mildly dilated RV with mild reduction of RVSF,               mild to moderate TR, mild to moderate PR, mildly elevated              PASP No date: COPD (chronic obstructive pulmonary disease) (HCC) No date: Family history of adverse reaction to anesthesia     Comment:  Mother - altered mental status (long term) No date: Glaucoma No date: History of pelvic fracture No date: Hyperglycemia     Comment:  mild inc in fasting sugar No date: Hyperlipidemia No date: Hypothyroidism No date: Insomnia No date: Migraines     Comment:  without aura, sx since age 89 No date: Osteopenia     Comment:  prev with 10 years of fosamax and intolerant of evista No date: PAF (paroxysmal atrial fibrillation) (HCC)     Comment:  a. initially noted 2012; b. CHADS2VASc at least 5 (CHF,               age x 2, vasacular disease,  female) No date: Pulmonary hypertension (HCC) No date: Wears dentures     Comment:  full upper and lower  Past Surgical History: No date: CATARACT EXTRACTION 08/27/2016: CATARACT EXTRACTION W/PHACO; Left     Comment:  Procedure: CATARACT EXTRACTION PHACO AND INTRAOCULAR               LENS PLACEMENT (IOC)  Left;  Surgeon: [name], MD;  Location: [facility];  Service:               Ophthalmology;  Laterality: Left; 2012: COLONOSCOPY No date: REFRACTIVE SURGERY  BMI    Body Mass Index: 22.82 kg/m      Reproductive/Obstetrics negative OB  ROS                             Anesthesia Physical Anesthesia Plan  ASA: 3  Anesthesia Plan: General   Post-op Pain Management: Ofirmev IV (intra-op)*   Induction: Intravenous  PONV Risk Score and Plan: 3 and Ondansetron and Dexamethasone  Airway Management Planned: Oral ETT  Additional Equipment: None  Intra-op Plan:   Post-operative Plan: Extubation in OR and Possible Post-op intubation/ventilation  Informed Consent: I have reviewed the patients History and Physical, chart, labs and discussed the procedure including the risks, benefits and alternatives for the proposed anesthesia with the patient or authorized representative who has indicated his/her understanding and acceptance.   Patient has DNR.  Discussed DNR with power of attorney and Suspend DNR.   Dental Advisory Given and Consent reviewed with POA  Plan Discussed with: Anesthesiologist, CRNA and Surgeon  Anesthesia Plan Comments: (Patient's daughters consented for risks of anesthesia including but not limited to:  - adverse reactions to medications - damage to eyes, teeth, lips or other oral mucosa - nerve damage due to positioning  - sore throat or hoarseness - Damage to heart, brain, nerves, lungs, other parts of body or loss of life  Patient's daughters voiced understanding and assent. Had extensive discussion regarding their mother's DNR. They ultimately believe her mom would want to be resuscitated (including chest compressions) in the event of a perioperative cardiac arrest, using our medical judgement. They do not want their mom to suffer needlessly.)       Anesthesia Quick Evaluation

## 2023-08-04 NOTE — Assessment & Plan Note (Signed)
 Most recent A1c of 8.3% approximately 3 weeks ago, presenting with hyperglycemia  - SSI, sensitive - Hold home regimen

## 2023-08-04 NOTE — Progress Notes (Signed)
 Patient arrived In pacu with low Bp, NEO boluses given per CRNA, Patient then in and out of SR , with increased BP, immediately starts back in afib and drops BP again , Dr Girard Lam at bedside with orders to start Neo drip .

## 2023-08-04 NOTE — ED Notes (Signed)
 Called lab to add on troponin. Lab informed RN that tube station was down and that they did not have any blood. RN to re-collect.

## 2023-08-04 NOTE — Assessment & Plan Note (Signed)
 Mildly elevated troponin at 65 in the absence of chest pain.  Potentially due to demand in the setting of femur fracture and paroxysmal A-fib.  - Repeat troponin pending

## 2023-08-04 NOTE — Op Note (Signed)
 DATE OF SURGERY:  08/04/2023  TIME: 8:08 PM  PATIENT NAME:  Vanessa [name]  AGE: 89 y.o.  PRE-OPERATIVE DIAGNOSIS:  Left Hip Fracture  POST-OPERATIVE DIAGNOSIS:  SAME  PROCEDURE:  FIXATION, FRACTURE, left INTERTROCHANTERIC, WITH INTRAMEDULLARY ROD  SURGEON:  Oletha Bernheim  ASST:  EBL: 50 cc  COMPLICATIONS: None  OPERATIVE IMPLANTS: Synthes trochanteric femoral nail 10 mm/135 degree with interlocking helical blade 85 mm and distal locking screw 32 mm.  PREOPERATIVE INDICATIONS:  Vanessa [name] is a 89 y.o. year old who fell and suffered a hip fracture. She was brought into the ER and then admitted and optimized and then elected for surgical intervention.    The risks benefits and alternatives were discussed with the patient including but not limited to the risks of nonoperative treatment, versus surgical intervention including infection, bleeding, nerve injury, malunion, nonunion, hardware prominence, hardware failure, need for hardware removal, blood clots, cardiopulmonary complications, morbidity, mortality, among others, and they were willing to proceed.    OPERATIVE PROCEDURE:  The patient was brought to the operating room and placed in the supine position.  General endotracheal anesthesia was administered . She was placed on the fracture table.  Closed reduction was performed under C-arm guidance. The length of the femur was also measured using fluoroscopy. Time out was then performed after sterile prep and drape. She received preoperative antibiotics.  Incision was made proximal to the greater trochanter. A guidewire was placed in the appropriate position. Confirmation was made on AP and lateral views. The above-named nail was opened. I opened the proximal femur with a reamer. I then placed the nail by hand easily down. I did not need to ream the femur.  Once the nail was completely seated, I placed a guidepin into the femoral head into the center center position through a  second incision.  I measured the length, and then reamed the lateral cortex and up into the head. I then placed the helical blade. Slight compression was applied. Anatomic fixation achieved. Bone quality was mediocre.  I then secured the proximal interlock.  The distal locking screw was then placed and after confirming the position of the fracture fragments and hardware I then removed the instruments, and took final C-arm pictures AP and lateral the entire length of the leg. Anatomic reconstruction was achieved, and the wounds were irrigated copiously and closed with Vicryl  followed by staples and dry sterile dressing. Sponge and needle count were correct.   The patient was awakened and returned to PACU in stable and satisfactory condition. There no complications and the patient tolerated the procedure well.  She will be partial weightbearing as tolerated, and will be on Lovenox  For DVT prophylaxis.     Oletha Bernheim, M.D.

## 2023-08-05 ENCOUNTER — Inpatient Hospital Stay

## 2023-08-05 ENCOUNTER — Encounter: Payer: Self-pay | Admitting: Specialist

## 2023-08-05 DIAGNOSIS — S72145A Nondisplaced intertrochanteric fracture of left femur, initial encounter for closed fracture: Secondary | ICD-10-CM | POA: Diagnosis not present

## 2023-08-05 LAB — BASIC METABOLIC PANEL WITH GFR
Anion gap: 12 (ref 5–15)
BUN: 24 mg/dL — ABNORMAL HIGH (ref 8–23)
CO2: 22 mmol/L (ref 22–32)
Calcium: 8.1 mg/dL — ABNORMAL LOW (ref 8.9–10.3)
Chloride: 105 mmol/L (ref 98–111)
Creatinine, Ser: 1.28 mg/dL — ABNORMAL HIGH (ref 0.44–1.00)
GFR, Estimated: 41 mL/min — ABNORMAL LOW (ref >60)
Glucose, Bld: 234 mg/dL — ABNORMAL HIGH (ref 70–99)
Potassium: 4.3 mmol/L (ref 3.5–5.1)
Sodium: 139 mmol/L (ref 135–145)

## 2023-08-05 LAB — CBC
HCT: 34.4 % — ABNORMAL LOW (ref 36.0–46.0)
Hemoglobin: 10.6 g/dL — ABNORMAL LOW (ref 12.0–15.0)
MCH: 28 pg (ref 26.0–34.0)
MCHC: 30.8 g/dL (ref 30.0–36.0)
MCV: 91 fL (ref 80.0–100.0)
Platelets: 210 10*3/uL (ref 150–400)
RBC: 3.78 MIL/uL — ABNORMAL LOW (ref 3.87–5.11)
RDW: 14.3 % (ref 11.5–15.5)
WBC: 16 10*3/uL — ABNORMAL HIGH (ref 4.0–10.5)
nRBC: 0 % (ref 0.0–0.2)

## 2023-08-05 LAB — GLUCOSE, CAPILLARY
Glucose-Capillary: 245 mg/dL — ABNORMAL HIGH (ref 70–99)
Glucose-Capillary: 277 mg/dL — ABNORMAL HIGH (ref 70–99)
Glucose-Capillary: 216 mg/dL — ABNORMAL HIGH (ref 70–99)
Glucose-Capillary: 141 mg/dL — ABNORMAL HIGH (ref 70–99)
Glucose-Capillary: 66 mg/dL — ABNORMAL LOW (ref 70–99)
Glucose-Capillary: 71 mg/dL (ref 70–99)

## 2023-08-05 LAB — TSH: TSH: 2.176 u[IU]/mL (ref 0.350–4.500)

## 2023-08-05 LAB — BRAIN NATRIURETIC PEPTIDE: B Natriuretic Peptide: 320.6 pg/mL — ABNORMAL HIGH (ref 0.0–100.0)

## 2023-08-05 LAB — T4, FREE: Free T4: 0.95 ng/dL (ref 0.61–1.12)

## 2023-08-05 LAB — LACTIC ACID, PLASMA: Lactic Acid, Venous: 3.2 mmol/L (ref 0.5–1.9)

## 2023-08-05 MED ORDER — DIGOXIN 0.25 MG/ML IJ SOLN
0.1250 mg | Freq: Once | INTRAMUSCULAR | Status: AC
  Administered 2023-08-05: 0.125 mg via INTRAVENOUS
  Filled 2023-08-05: qty 2

## 2023-08-05 MED ORDER — DIGOXIN 0.25 MG/ML IJ SOLN
0.2500 mg | Freq: Once | INTRAMUSCULAR | Status: AC
Start: 2023-08-05 — End: 2023-08-05
  Administered 2023-08-05: 0.25 mg via INTRAVENOUS
  Filled 2023-08-05: qty 2

## 2023-08-05 MED ORDER — INSULIN ASPART 100 UNIT/ML IJ SOLN
0.0000 [IU] | INTRAMUSCULAR | Status: DC
  Administered 2023-08-05 (×2): 5 [IU] via SUBCUTANEOUS
  Administered 2023-08-05: 8 [IU] via SUBCUTANEOUS
  Administered 2023-08-05: 2 [IU] via SUBCUTANEOUS
  Filled 2023-08-05 (×4): qty 1

## 2023-08-05 MED ORDER — SODIUM CHLORIDE 0.9 % IV SOLN: INTRAVENOUS | Status: DC

## 2023-08-05 MED ORDER — MEMANTINE HCL 10 MG PO TABS
5.0000 mg | ORAL_TABLET | Freq: Two times a day (BID) | ORAL | Status: DC
  Administered 2023-08-05 – 2023-08-07 (×5): 5 mg via ORAL
  Filled 2023-08-05 (×6): qty 1

## 2023-08-05 NOTE — Plan of Care (Signed)
  Problem: Coping: Goal: Ability to adjust to condition or change in health will improve Outcome: Progressing   Problem: Coping: Goal: Ability to adjust to condition or change in health will improve Outcome: Progressing   Problem: Fluid Volume: Goal: Ability to maintain a balanced intake and output will improve Outcome: Progressing   Problem: Fluid Volume: Goal: Ability to maintain a balanced intake and output will improve Outcome: Progressing   Problem: Skin Integrity: Goal: Risk for impaired skin integrity will decrease Outcome: Progressing   Problem: Skin Integrity: Goal: Risk for impaired skin integrity will decrease Outcome: Progressing   Problem: Tissue Perfusion: Goal: Adequacy of tissue perfusion will improve Outcome: Progressing   Problem: Tissue Perfusion: Goal: Adequacy of tissue perfusion will improve Outcome: Progressing   Problem: Clinical Measurements: Goal: Will remain free from infection Outcome: Progressing Goal: Diagnostic test results will improve Outcome: Progressing Goal: Respiratory complications will improve Outcome: Progressing   Problem: Clinical Measurements: Goal: Will remain free from infection Outcome: Progressing

## 2023-08-05 NOTE — Progress Notes (Signed)
 Hospitalist and orthopedic provider has been notified about not Rn not being able to obtain pulse on left foot with doppler. MD has ordered bilateral lower extremity ultrasound.

## 2023-08-05 NOTE — Anesthesia Postprocedure Evaluation (Signed)
 Anesthesia Post Note  Patient: Vanessa [name]  Procedure(s) Performed: FIXATION, FRACTURE, INTERTROCHANTERIC, WITH INTRAMEDULLARY ROD (Left: Hip)  Anesthesia Type: General Anesthetic complications: no   No notable events documented.   Last Vitals:  Vitals:   08/05/23 0745 08/05/23 0751  BP:  94/65  Pulse:  (!) 111  Resp: (!) 22 14  Temp:  (!) 36.3 C  SpO2:  90%    Last Pain:  Vitals:   08/05/23 0751  TempSrc: Axillary  PainSc:                  [name]

## 2023-08-05 NOTE — Evaluation (Signed)
 Physical Therapy Evaluation Patient Details Name: Vanessa [name] MRN: [id number] DOB: 01/30/1935 Today's Date: 08/05/2023  History of Present Illness  Patient is a 89 year old female with fall with left hip fracture s/p left hip repair with post-op A-fib with RVR and hypotension requiring vasopressors in PACU. History of heart failure, COPD, dementia, diabetes mellitus, pulmonary hypertension.  Clinical Impression  Patient is cooperative throughout session. Her daughter was at bedside and reports the patient lives in the long term care side of [facility]. She is ambulatory with a 2 wheeled walker with supervision from staff but required lifting assistance to stand from her chair.  Today the patient reports moderate pain in the left hip. +2 person assistance required for bed mobility. The patient was able to stand with Mod A +2 person assistance. Limited standing tolerance secondary to pain. No dizziness reported with upright activity. The patient is not at her baseline level of functional independence. Recommend PT to maximize function and decrease caregiver burden. Rehabilitation < 3 hours/day recommended after this hospital stay.       If plan is discharge home, recommend the following: Two people to help with walking and/or transfers;A lot of help with bathing/dressing/bathroom;Assist for transportation;Help with stairs or ramp for entrance;Supervision due to cognitive status;Assistance with cooking/housework   Can travel by private vehicle   No    Equipment Recommendations None recommended by PT  Recommendations for Other Services       Functional Status Assessment Patient has had a recent decline in their functional status and demonstrates the ability to make significant improvements in function in a reasonable and predictable amount of time.     Precautions / Restrictions Precautions Precautions: Fall Recall of Precautions/Restrictions: Impaired Restrictions Weight  Bearing Restrictions Per Provider Order: Yes LLE Weight Bearing Per Provider Order: Partial weight bearing LLE Partial Weight Bearing Percentage or Pounds: 50%      Mobility  Bed Mobility Overal bed mobility: Needs Assistance Bed Mobility: Supine to Sit, Sit to Supine     Supine to sit: Max assist, +2 for physical assistance Sit to supine: Max assist, +2 for physical assistance   General bed mobility comments: verbal cues for technique with increased time and effort required    Transfers Overall transfer level: Needs assistance Equipment used: 2 person hand held assist Transfers: Sit to/from Stand Sit to Stand: Mod assist, +2 physical assistance           General transfer comment: cues for technique.    Ambulation/Gait               General Gait Details: unable to at this time due to limited standing tolerance secondary to pain  Stairs            Wheelchair Mobility     Tilt Bed    Modified Rankin (Stroke Patients Only)       Balance Overall balance assessment: Needs assistance Sitting-balance support: Feet supported Sitting balance-Leahy Scale: Poor Sitting balance - Comments: initial external support provided, progressing to fair sitting balance with increased sitting time   Standing balance support: Bilateral upper extremity supported Standing balance-Leahy Scale: Poor Standing balance comment: external support required to maintain standing balance. standing tolerance of only a few seconds.                             Pertinent Vitals/Pain Pain Assessment Pain Assessment: Faces Faces Pain Scale: Hurts even more Pain Location:  L hip Pain Descriptors / Indicators: Discomfort Pain Intervention(s): Monitored during session, Limited activity within patient's tolerance, Repositioned, Ice applied (ice pack applied at end end of session)    Home Living Family/patient expects to be discharged to:: Skilled nursing facility                    Additional Comments: long term care at [facility] per daughter    Prior Function Prior Level of Function : Needs assist             Mobility Comments: ambulatory with 2 wheeled walker at the SNF with supervision from staff. lifting assistance requried for standing ADLs Comments: assistance required     Extremity/Trunk Assessment   Upper Extremity Assessment Upper Extremity Assessment: Defer to OT evaluation    Lower Extremity Assessment Lower Extremity Assessment: RLE deficits/detail;LLE deficits/detail RLE Deficits / Details: can complete LAQ while sitting. generalized weakness throughout LLE Deficits / Details: pain with active movement of L hip       Communication   Communication Communication: No apparent difficulties    Cognition Arousal: Alert Behavior During Therapy: WFL for tasks assessed/performed   PT - Cognitive impairments: History of cognitive impairments                       PT - Cognition Comments: Patient is cooperative. She is disoriented to place and situation. Following commands: Impaired Following commands impaired: Follows one step commands with increased time     Cueing Cueing Techniques: Verbal cues, Tactile cues, Visual cues     General Comments General comments (skin integrity, edema, etc.): no dizziness is reported with activity. blood pressure 77/61 after return to bed with no dizziness reported with activity    Exercises     Assessment/Plan    PT Assessment Patient needs continued PT services  PT Problem List Decreased strength;Decreased range of motion;Decreased activity tolerance;Decreased balance;Decreased mobility;Decreased safety awareness;Decreased knowledge of precautions;Pain       PT Treatment Interventions DME instruction;Gait training;Stair training;Functional mobility training;Therapeutic activities;Therapeutic exercise;Balance training;Neuromuscular re-education;Cognitive  remediation;Patient/family education    PT Goals (Current goals can be found in the Care Plan section)  Acute Rehab PT Goals Patient Stated Goal: to return to [facility] PT Goal Formulation: With patient/family Time For Goal Achievement: 08/19/23 Potential to Achieve Goals: Good    Frequency 7X/week     Co-evaluation PT/OT/SLP Co-Evaluation/Treatment: Yes Reason for Co-Treatment: To address functional/ADL transfers PT goals addressed during session: Mobility/safety with mobility         AM-PAC PT "6 Clicks" Mobility  Outcome Measure Help needed turning from your back to your side while in a flat bed without using bedrails?: A Lot Help needed moving from lying on your back to sitting on the side of a flat bed without using bedrails?: A Lot Help needed moving to and from a bed to a chair (including a wheelchair)?: Total Help needed standing up from a chair using your arms (e.g., wheelchair or bedside chair)?: Total Help needed to walk in hospital room?: Total Help needed climbing 3-5 steps with a railing? : Total 6 Click Score: 8    End of Session   Activity Tolerance: Patient limited by fatigue Patient left: in bed;with call bell/phone within reach;with bed alarm set;with SCD's reapplied;with family/visitor present Nurse Communication: Mobility status PT Visit Diagnosis: Other abnormalities of gait and mobility (R26.89);Difficulty in walking, not elsewhere classified (R26.2)    Time: 1610-9604 PT Time Calculation (min) (ACUTE  ONLY): 17 min   Charges:   PT Evaluation $PT Eval High Complexity: 1 High   PT General Charges $$ ACUTE PT VISIT: 1 Visit         Ozie Bo, PT, MPT   Erlene Hawks 08/05/2023, 2:06 PM

## 2023-08-05 NOTE — TOC Initial Note (Signed)
 Transition of Care [facility]) - Initial/Assessment Note    Patient Details  Name: Vanessa [name] MRN: [id number] Date of Birth: 1935-04-20  [facility]) CM/SW Contact:    Thayne Cindric A Laverna Dossett, RN Phone Number: 08/05/2023, 2:29 PM  Clinical Narrative:                 Chart reviewed.  Noted that patient was admitted with left   Femur fracture.  Noted that patient is post op day 1 Fixation, Fracture, Intertrochanteric with intramedullary rod.    I have spoken with Cain Castillo, Admission Coordinator with [facility].  Cain Castillo informs me prior to admission patient was a long-term care resident at [facility].  Cain Castillo reports that patient will need SNF authorization for short term rehab when patient is stable for discharge.  Patient developed hypotension post surgery and  required Phenylephrine gtt.  TOC will continue to follow progress of patient.           Expected Discharge Plan: Skilled Nursing Facility Barriers to Discharge: No Barriers Identified   Patient Goals and CMS Choice            Expected Discharge Plan and Services       Living arrangements for the past 2 months: Skilled Nursing Facility (Patient was a long term care resident of [facility].)                                      Prior Living Arrangements/Services Living arrangements for the past 2 months: Skilled Nursing Facility (Patient was a long term care resident of [facility].)                     Activities of Daily Living   ADL Screening (condition at time of admission) Independently performs ADLs?: No Does the patient have a NEW difficulty with bathing/dressing/toileting/self-feeding that is expected to last >3 days?: Yes (Initiates electronic notice to provider for possible OT consult) Does the patient have a NEW difficulty with getting in/out of bed, walking, or climbing stairs that is expected to last >3 days?: Yes (Initiates electronic notice to provider for possible PT  consult) Does the patient have a NEW difficulty with communication that is expected to last >3 days?: No Is the patient deaf or have difficulty hearing?: No Does the patient have difficulty seeing, even when wearing glasses/contacts?: No Does the patient have difficulty concentrating, remembering, or making decisions?: Yes  Permission Sought/Granted                  Emotional Assessment              Admission diagnosis:  Femur fracture (HCC) [S72.90XA] Closed fracture of left hip, initial encounter (HCC) [S72.002A] Patient Active Problem List   Diagnosis Date Noted   Femur fracture (HCC) 08/04/2023   Paroxysmal atrial fibrillation (HCC) 08/04/2023   Elevated troponin 08/04/2023   Diastolic CHF with preserved left ventricular function, NYHA class 2 (HCC) 02/21/2023   Protein-calorie malnutrition, unspecified severity (HCC) 02/18/2023   History of chronic 02/15/2023   Femur fracture, right (HCC) 02/15/2023   Acute metabolic encephalopathy 02/15/2023   ABLA (acute blood loss anemia) 02/15/2023   Closed displaced intertrochanteric fracture of right femur (HCC) 02/11/2023   Septic shock (HCC) 02/07/2023   History of UTI 11/28/2022   Chronic obstructive pulmonary disease (HCC) 08/03/2022   Hypokalemia 08/03/2022   Syncope 08/03/2022  Dementia (HCC) 08/03/2022   Prolonged QT interval 08/03/2022   AKI (acute kidney injury) (HCC) 08/03/2022   Dysuria 09/24/2021   Dizziness of unknown cause 09/21/2021   B12 deficiency 03/22/2019   Mild cognitive impairment 03/18/2019   Pelvic fracture (HCC) 10/05/2018   Abnormal weight loss 08/19/2017   Hypotension 01/18/2016   Fatigue 07/12/2015   Diabetes mellitus with hyperglycemia (HCC) 05/25/2014   OA (osteoarthritis) 05/25/2014   Adjustment disorder 01/07/2014   Environmental allergies 12/13/2013   Arthritis 06/27/2013   Carotid stenosis 11/04/2012   Migraine 11/12/2011   Medicare annual wellness visit, subsequent 05/15/2011    Osteopenia 01/25/2011   Hypothyroid 01/25/2011   Advance directive discussed with patient 01/24/2011   SVT (supraventricular tachycardia) (HCC) 08/17/2010   Hyperlipidemia 08/17/2010   PCP:  [name], MD Pharmacy:   [facility] [place], [place] - [address] [place] [address] Phone: [phone] Fax: [phone]  [id number] [place],  - [address] AT [facility] [address] [place] [address] [place] [address] Phone: [phone] Fax: [phone]     Social Drivers of Health (SDOH) Social History: SDOH Screenings   Food Insecurity: No Food Insecurity (08/05/2023)  Housing: Low Risk  (02/08/2023)  Transportation Needs: No Transportation Needs (08/05/2023)  Utilities: Not At Risk (08/05/2023)  Alcohol Screen: Low Risk  (06/13/2022)  Depression (PHQ2-9): Low Risk  (07/16/2023)  Financial Resource Strain: Low Risk  (08/10/2022)  Physical Activity: Unknown (08/10/2022)  Recent Concern: Physical Activity - Inactive (08/10/2022)  Social Connections: Unknown (08/10/2022)  Recent Concern: Social Connections - Moderately Isolated (06/13/2022)  Stress: No Stress Concern Present (08/10/2022)  Tobacco Use: Medium Risk (08/04/2023)   SDOH Interventions:     Readmission Risk Interventions     No data to display

## 2023-08-05 NOTE — Evaluation (Signed)
 Clinical/Bedside Swallow Evaluation Patient Details  Name: Vanessa [name] MRN: [id number] Date of Birth: [birth year]  Today's Date: 08/05/2023 Time: SLP Start Time (ACUTE ONLY): 1215 SLP Stop Time (ACUTE ONLY): 1240 SLP Time Calculation (min) (ACUTE ONLY): 25 min  Past Medical History:  Past Medical History:  Diagnosis Date   Anemia    treated ~2009 with iron, resolved   Arthritis    Cataract    surgery on R catarct 2012   Chronic systolic CHF (congestive heart failure) (HCC)    a. TTE 2012: EF of 45-50%, mild diffuse HK, trivial AI, mild MR, mildly dilated RV with mild reduction of RVSF, mild to moderate TR, mild to moderate PR, mildly elevated PASP   COPD (chronic obstructive pulmonary disease) (HCC)    Family history of adverse reaction to anesthesia    Mother - altered mental status (long term)   Glaucoma    History of pelvic fracture    Hyperglycemia    mild inc in fasting sugar   Hyperlipidemia    Hypothyroidism    Insomnia    Migraines    without aura, sx since age 37   Osteopenia    prev with 10 years of fosamax and intolerant of evista   PAF (paroxysmal atrial fibrillation) (HCC)    a. initially noted 2012; b. CHADS2VASc at least 5 (CHF, age x 2, vasacular disease, female)   Pulmonary hypertension (HCC)    Wears dentures    full upper and lower   Past Surgical History:  Past Surgical History:  Procedure Laterality Date   CATARACT EXTRACTION     CATARACT EXTRACTION W/PHACO Left 08/27/2016   Procedure: CATARACT EXTRACTION PHACO AND INTRAOCULAR LENS PLACEMENT (IOC)  Left;  Surgeon: Lockie Mola, MD;  Location: [facility];  Service: Ophthalmology;  Laterality: Left;   COLONOSCOPY  2012   INTRAMEDULLARY (IM) NAIL INTERTROCHANTERIC Right 02/07/2023   Procedure: INTRAMEDULLARY (IM) NAIL INTERTROCHANTERIC;  Surgeon: Christena Flake, MD;  Location: [facility] ORS;  Service: Orthopedics;  Laterality: Right;   REFRACTIVE SURGERY     HPI:  Vanessa [name] is a  89 y.o. female with medical history significant of Chronic systolic and diastolic CHF, COPD, SVT, type 2 diabetes, hypothyroidism, atrial fibrillation not on AC, chronic hypotension on midodrine, who presents to the ED due to unwitnessed fall. Vanessa [name] states she cannot recall exactly what happened with the fall.  Per chart review, she was sitting in a recliner and tried to get up without help and fell forward landing on her left side.  Due to this, EMS was called.  At this time, Vanessa [name] endorses left hip pain but otherwise denies chest pain, shortness of breath, dizziness, headache, nausea, vomiting. Pt is now s/p FIXATION, FRACTURE, left INTERTROCHANTERIC, WITH INTRAMEDULLARY ROD. MD Progress note reporting ILD 2/2 recurrent aspiration pneumonia. CXR 08/04/23:  No acute cardiopulmonary process. Large hiatal hernia. CT Head pending    Assessment / Plan / Recommendation  Clinical Impression  Pt seen for bedside swallow assessment in the setting of concern for recurrent PNA. Pt alert, sitting upright in bed, with daughter present at the bedside. 3L nasal canula in place. O2 saturations averaging at 95. Lunch tray present (pt currently on Dys 3 mech soft) and thin liquids. Pt seen with trials of thin liquids (via straw), mixed consistencies (soup), and regular solids. No overt or subtle s/sx pharyngeal dysphagia noted. No change to vocal quality across trials. Vitals stable for duration of trials. Oral phase grossly intact- with  complete manipulation and clearance of regular solid from oral cavity.       Daughter reported that pt had PNA during last admission, Oct 2024, but not since. Daughter also reporting awareness of hiatal hernia and pt's GERD (which is medically managed at home). Education shared regarding importance of upright positioning during and after intake to reduce risk for post prandial aspiration. Daughter reported understanding.       Based on age, GI concerns, and current debility,  pt is at increased risk of aspiration, therefore recommend aspiration precautions (slow rate, small bites, elevated HOB, and alert for PO intake). Continue with mech soft (Dys 3) to aid endurance for meals and thin liquids. MD and RN aware of recommendations.  SLP Visit Diagnosis: Dysphagia, unspecified (R13.10)    Aspiration Risk  Mild aspiration risk    Diet Recommendation   Thin;Dysphagia 3 (mechanical soft)  Medication Administration: Whole meds with liquid    Other  Recommendations Oral Care Recommendations: Oral care BID;Staff/trained caregiver to provide oral care    Recommendations for follow up therapy are one component of a multi-disciplinary discharge planning process, led by the attending physician.  Recommendations may be updated based on patient status, additional functional criteria and insurance authorization.  Follow up Recommendations No SLP follow up      Assistance Recommended at Discharge  Intermittent supervision for Po intake   Functional Status Assessment Patient has not had a recent decline in their functional status (in regards to oropharyngeal swallow function)    Swallow Study   General Date of Onset: 08/05/23 HPI: Vanessa [name] is a 89 y.o. female with medical history significant of Chronic systolic and diastolic CHF, COPD, SVT, type 2 diabetes, hypothyroidism, atrial fibrillation not on AC, chronic hypotension on midodrine, who presents to the ED due to unwitnessed fall. Vanessa [name] states she cannot recall exactly what happened with the fall.  Per chart review, she was sitting in a recliner and tried to get up without help and fell forward landing on her left side.  Due to this, EMS was called.  At this time, Vanessa [name] endorses left hip pain but otherwise denies chest pain, shortness of breath, dizziness, headache, nausea, vomiting. Pt is now s/p FIXATION, FRACTURE, left INTERTROCHANTERIC, WITH INTRAMEDULLARY ROD. MD Progress note reporting ILD 2/2  recurrent aspiration pneumonia. CXR 08/04/23:  No acute cardiopulmonary process. Large hiatal hernia. CT Head pending Type of Study: Bedside Swallow Evaluation Previous Swallow Assessment: none in chart Diet Prior to this Study: Dysphagia 3 (mechanical soft);Thin liquids (Level 0) Temperature Spikes Noted: No (16.0 (now trending down)) Respiratory Status: Nasal cannula (3L) History of Recent Intubation:  (for procedure only) Behavior/Cognition: Alert;Cooperative;Pleasant mood Oral Cavity Assessment: Within Functional Limits Oral Care Completed by SLP: No Oral Cavity - Dentition: Dentures, top;Dentures, bottom Vision: Functional for self-feeding Self-Feeding Abilities: Able to feed self;Needs set up Patient Positioning: Upright in bed Baseline Vocal Quality: Normal Volitional Swallow: Able to elicit    Oral/Motor/Sensory Function Overall Oral Motor/Sensory Function: Within functional limits   Ice Chips Ice chips: Not tested   Thin Liquid Thin Liquid: Within functional limits Presentation: Straw;Self Fed    Nectar Thick Nectar Thick Liquid: Not tested   Honey Thick Honey Thick Liquid: Not tested   Puree Puree: Not tested   Solid     Solid: Within functional limits Presentation: Self Fed     [place] Lonisha Bobby Clapp, MS, CCC-SLP Speech Language Pathologist Rehab Services; [facility] [phone] (ascom)   [place] J  Clapp 08/05/2023,12:57 PM

## 2023-08-05 NOTE — Consult Note (Addendum)
 [facility] CARDIOLOGY CONSULT NOTE       Patient ID: Vanessa [name] MRN: [id number] DOB/AGE: 87-Jan-1936 87 y.o.  Admit date: 08/04/2023 Referring Physician Dr., Sari Cunning Primary Physician [name], MD  Primary Cardiologist Dr Beau Bound Reason for Consultation AF RVR  HPI: Vanessa [name] is a 89 y.o. female  with a past medical history of chronic combined systolic and diastolic heart failure, paroxysmal atrial fibrillation, Hx SVT, chronic hypotension on midodrine, hyperlipidemia, COPD, type 2 diabetes mellitus who presented to the ED on 08/04/2023 following an unwitnessed fall that resulted in a fracture of the left femoral neck requiring surgery. In the setting of left hip pain patient went into atrial fibrillation, this is not new for the patient. Cardiology was consulted for further evaluation.   Patient is demented and poor historian, history mostly obtained via chart review and from daughters present at bedside. Patient reported to the ED via EMS from [facility] after an unwitnessed fall.  Patient reports that she does not remember the fall.  Patient denied any chest pain or shortness of breath, headache or change in vision.  Workup in the ED notable for sodium 138, potassium 4.0, creatinine 1.23, WBC 8.8, hemoglobin 14.2.  BNP 320.  Troponins mildly elevated and flat 65 > 56. EKG in ED prior to surgery sinus Rhythm, rate 60 bpm.  Chest x-ray showed no acute cardiopulmonary disease.  Hip x-ray showed fracture of the left femoral neck.  In the setting of her left hip pain patient went to atrial fibrillation RVR rates in the 120s.  Patient received 1 dose of IV metoprolol 2.5 mg for rate control.  At 8 PM on 4/13 patient underwent fixation of left femur.  S/p left hip repair patient developed hypotension requiring vasopressors in the PACU and PCCM was consulted.  Patient was on phenylephrine gtt for hypotension and stopped at 0640 this morning.  At the time of my evaluation this  morning, patient was sitting up in no acute distress with daughters at bedside.  Patient states she "feels good".  Patient denies any pain or concerns.  When asked about the events leading up to today patient is unsure what happened.  Patient reports she does not remember falling.  Patient remains in atrial fibrillation with mildly elevated rates and soft blood pressure.  Review of systems complete and found to be negative unless listed above    Past Medical History:  Diagnosis Date   Anemia    treated ~2009 with iron, resolved   Arthritis    Cataract    surgery on R catarct 2012   Chronic systolic CHF (congestive heart failure) (HCC)    a. TTE 2012: EF of 45-50%, mild diffuse HK, trivial AI, mild MR, mildly dilated RV with mild reduction of RVSF, mild to moderate TR, mild to moderate PR, mildly elevated PASP   COPD (chronic obstructive pulmonary disease) (HCC)    Family history of adverse reaction to anesthesia    Mother - altered mental status (long term)   Glaucoma    History of pelvic fracture    Hyperglycemia    mild inc in fasting sugar   Hyperlipidemia    Hypothyroidism    Insomnia    Migraines    without aura, sx since age 79   Osteopenia    prev with 10 years of fosamax and intolerant of evista   PAF (paroxysmal atrial fibrillation) (HCC)    a. initially noted 2012; b. CHADS2VASc at least 5 (CHF, age x  2, vasacular disease, female)   Pulmonary hypertension (HCC)    Wears dentures    full upper and lower    Past Surgical History:  Procedure Laterality Date   CATARACT EXTRACTION     CATARACT EXTRACTION W/PHACO Left 08/27/2016   Procedure: CATARACT EXTRACTION PHACO AND INTRAOCULAR LENS PLACEMENT (IOC)  Left;  Surgeon: [name], MD;  Location: [facility];  Service: Ophthalmology;  Laterality: Left;   COLONOSCOPY  2012   INTRAMEDULLARY (IM) NAIL INTERTROCHANTERIC Right 02/07/2023   Procedure: INTRAMEDULLARY (IM) NAIL INTERTROCHANTERIC;  Surgeon: Elner Hahn, MD;  Location: [facility] ORS;  Service: Orthopedics;  Laterality: Right;   REFRACTIVE SURGERY      Medications Prior to Admission  Medication Sig Dispense Refill Last Dose/Taking   acetaminophen (TYLENOL) 325 MG tablet Take 1-2 tablets (325-650 mg total) by mouth every 6 (six) hours as needed for mild pain (pain score 1-3) (pain score 1-3 or temp > 100.5).   Taking As Needed   aspirin EC 81 MG tablet Take 81 mg by mouth daily.   08/04/2023   Cholecalciferol (VITAMIN D) 50 MCG (2000 UT) CAPS Take 1 capsule (2,000 Units total) by mouth daily.   08/04/2023   empagliflozin (JARDIANCE) 10 MG TABS tablet Take 10 mg by mouth daily.   08/04/2023   levothyroxine (SYNTHROID) 25 MCG tablet TAKE 1 TABLET BY MOUTH ONCE DAILY ON AN EMPTY STOMACH. WAIT 30 MINUTES BEFORE TAKING OTHER MEDS. 90 tablet 1 08/04/2023   melatonin 5 MG TABS Take 2.5 mg by mouth at bedtime as needed.   Past Week   memantine (NAMENDA) 5 MG tablet Take 5 mg by mouth 2 (two) times daily.   08/04/2023   metoprolol succinate (TOPROL-XL) 25 MG 24 hr tablet Take 12.5 mg by mouth daily.   08/04/2023 Morning   midodrine (PROAMATINE) 10 MG tablet Take 10 mg by mouth 3 (three) times daily.   08/04/2023   pantoprazole (PROTONIX) 20 MG tablet Take 20 mg by mouth daily.   Taking   polyethylene glycol (MIRALAX / GLYCOLAX) 17 g packet Take 17 g by mouth daily. 14 each 0 08/03/2023   Potassium 99 MG TABS Take 2 tablets (198 mg total) by mouth daily.   08/04/2023   senna-docusate (SENOKOT-S) 8.6-50 MG tablet Take 1 tablet by mouth 2 (two) times daily as needed for mild constipation.   Taking As Needed   torsemide (DEMADEX) 10 MG tablet Take 10 mg by mouth daily.   08/04/2023   Zinc Oxide (TRIPLE PASTE) 40 % PSTE Apply 1 Application topically as needed (to buttocks and sacrum).   Past Week   feeding supplement (ENSURE ENLIVE / ENSURE PLUS) LIQD Take 237 mLs by mouth 2 (two) times daily between meals. 237 mL 12    Social History   Socioeconomic History    Marital status: Widowed    Spouse name: Not on file   Number of children: Not on file   Years of education: Not on file   Highest education level: Some college, no degree  Occupational History   Not on file  Tobacco Use   Smoking status: Former    Current packs/day: 0.25    Average packs/day: 0.3 packs/day for 50.0 years (12.5 ttl pk-yrs)    Types: Cigarettes   Smokeless tobacco: Never   Tobacco comments:    started smoking in 1958- stopped 2020 after a fall  Vaping Use   Vaping status: Never Used  Substance and Sexual Activity   Alcohol use:  No   Drug use: No   Sexual activity: Never  Other Topics Concern   Not on file  Social History Narrative   From [place].     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  Was married for 47 years.     Active in church and senior groups.     Likes gospel singing.     Lives alone (with her dog Kirt Boys)   4 kids, all are nearby.     Social Drivers of Corporate investment banker Strain: Low Risk  (08/10/2022)   Overall Financial Resource Strain (CARDIA)    Difficulty of Paying Living Expenses: Not very hard  Food Insecurity: No Food Insecurity (08/05/2023)   Hunger Vital Sign    Worried About Running Out of Food in the Last Year: Never true    Ran Out of Food in the Last Year: Never true  Transportation Needs: No Transportation Needs (08/05/2023)   PRAPARE - Administrator, Civil Service (Medical): No    Lack of Transportation (Non-Medical): No  Physical Activity: Unknown (08/10/2022)   Exercise Vital Sign    Days of Exercise per Week: 0 days    Minutes of Exercise per Session: Not on file  Recent Concern: Physical Activity - Inactive (08/10/2022)   Exercise Vital Sign    Days of Exercise per Week: 0 days    Minutes of Exercise per Session: 0 min  Stress: No Stress Concern Present (08/10/2022)   [employer] of Occupational Health - Occupational Stress Questionnaire    Feeling of Stress : Not at all  Social  Connections: Unknown (08/10/2022)   Social Connection and Isolation Panel [NHANES]    Frequency of Communication with Friends and Family: Patient declined    Frequency of Social Gatherings with Friends and Family: More than three times a week    Attends Religious Services: Patient declined    Database administrator or Organizations: No    Attends Banker Meetings: Not on file    Marital Status: Widowed  Recent Concern: Social Connections - Moderately Isolated (06/13/2022)   Social Connection and Isolation Panel [NHANES]    Frequency of Communication with Friends and Family: More than three times a week    Frequency of Social Gatherings with Friends and Family: More than three times a week    Attends Religious Services: More than 4 times per year    Active Member of [employer] or Organizations: No    Attends Banker Meetings: Never    Marital Status: Widowed  Intimate Partner Violence: Not At Risk (02/08/2023)   Humiliation, Afraid, Rape, and Kick questionnaire    Fear of Current or Ex-Partner: No    Emotionally Abused: No    Physically Abused: No    Sexually Abused: No    Family History  Problem Relation Age of Onset   Diabetes Father    Migraines Mother    Stroke Mother        TIAs   Breast cancer Neg Hx    Colon cancer Neg Hx      Vitals:   08/05/23 0715 08/05/23 0730 08/05/23 0745 08/05/23 0751  BP:  (!) 87/62  94/65  Pulse: 90 90  (!) 111  Resp: 19 (!) 22 (!) 22 14  Temp:    (!) 97.3 F (36.3 C)  TempSrc:    Axillary  SpO2: 99% 97%  90%  Weight:      Height:  PHYSICAL EXAM General: Well-appearing elderly female, well nourished, in no acute distress. HEENT: Normocephalic and atraumatic. Neck: No JVD.  Lungs: Normal respiratory effort on 4L. Clear bilaterally to auscultation. No wheezes, crackles, rhonchi.  Heart: Irregularly irregular, mildly elevated heart rate. Normal S1 and S2 without gallops or murmurs.  Abdomen: Non-distended  appearing.  Msk: Normal strength and tone for age. Extremities: Warm and well perfused. No clubbing, cyanosis.  No edema.  Neuro: Alert and oriented X 3. Psych: Answers questions appropriately.   Labs: Basic Metabolic Panel: Recent Labs    08/04/23 2214 08/05/23 0435  NA 137 139  K 4.5 4.3  CL 99 105  CO2 24 22  GLUCOSE 289* 234*  BUN 24* 24*  CREATININE 1.47* 1.28*  CALCIUM 8.5* 8.1*   Liver Function Tests: No results for input(s): "AST", "ALT", "ALKPHOS", "BILITOT", "PROT", "ALBUMIN" in the last 72 hours. No results for input(s): "LIPASE", "AMYLASE" in the last 72 hours. CBC: Recent Labs    08/04/23 2214 08/05/23 0435  WBC 19.0* 16.0*  HGB 11.8* 10.6*  HCT 37.5 34.4*  MCV 92.1 91.0  PLT 261 210   Cardiac Enzymes: Recent Labs    08/04/23 1312 08/04/23 1705  TROPONINIHS 65* 56*   BNP: Recent Labs    08/04/23 2214  BNP 320.6*   D-Dimer: No results for input(s): "DDIMER" in the last 72 hours. Hemoglobin A1C: No results for input(s): "HGBA1C" in the last 72 hours. Fasting Lipid Panel: No results for input(s): "CHOL", "HDL", "LDLCALC", "TRIG", "CHOLHDL", "LDLDIRECT" in the last 72 hours. Thyroid Function Tests: Recent Labs    08/05/23 0435  TSH 2.176   Anemia Panel: No results for input(s): "VITAMINB12", "FOLATE", "FERRITIN", "TIBC", "IRON", "RETICCTPCT" in the last 72 hours.   Radiology: DG HIP UNILAT WITH PELVIS 2-3 VIEWS LEFT Result Date: 08/04/2023 CLINICAL DATA:  Left intramedullary nail EXAM: DG HIP (WITH OR WITHOUT PELVIS) 2-3V LEFT COMPARISON:  Left hip x-ray 08/04/2023. FINDINGS: Three intraoperative fluoroscopic views of the left hip. Left femoral intramedullary nail and hip screw replaced. Alignment is anatomic as can be seen. IMPRESSION: Left femoral intramedullary nail and hip screw replaced. Electronically Signed   By: Darliss Cheney M.D.   On: 08/04/2023 20:27   DG C-Arm 1-60 Min-No Report Result Date: 08/04/2023 Fluoroscopy was utilized by  the requesting physician.  No radiographic interpretation.   DG C-Arm 1-60 Min-No Report Result Date: 08/04/2023 Fluoroscopy was utilized by the requesting physician.  No radiographic interpretation.   DG Chest Portable 1 View Result Date: 08/04/2023 CLINICAL DATA:  Pain, LEFT hip fall. EXAM: PORTABLE CHEST 1 VIEW COMPARISON:  Radiograph 02/09/2023 FINDINGS: Large hiatal hernia posterior to the heart. Entire stomach is above the hemidiaphragm. No pulmonary edema or pleural fluid. No pneumothorax. No acute osseous abnormality. IMPRESSION: 1. No acute cardiopulmonary process. 2. Large hiatal hernia. Electronically Signed   By: Genevive Bi M.D.   On: 08/04/2023 13:11   DG Hip Unilat With Pelvis 2-3 Views Left Result Date: 08/04/2023 CLINICAL DATA:  Fall, LEFT hip pain EXAM: DG HIP (WITH OR WITHOUT PELVIS) 2-3V LEFT COMPARISON:  None Available. FINDINGS: Intertrochanteric fracture of the LEFT femoral neck. Mild caudad migration of the femur. No angulation. Avulsion of the lesser trochanter. Lucency in the LEFT inferior pubic ramus. Potential aggressive osseous lesion. Internal fixation of the RIGHT hip. IMPRESSION: Intertrochanteric fracture of the LEFT femoral neck. Lucency in the LEFT inferior pubic ramus. Potential aggressive osseous lesion. Electronically Signed   By: Loura Halt.D.  On: 08/04/2023 13:09    ECHO 02/08/2023  1. Left ventricular ejection fraction, by estimation, is 45 to 50%. Left ventricular ejection fraction by PLAX is 49 %. The left ventricle has mildly decreased function. The left ventricle demonstrates global hypokinesis. Left ventricular diastolic parameters are indeterminate.   2. Right ventricular systolic function is normal. The right ventricular size is normal. There is normal pulmonary artery systolic pressure. The estimated right ventricular systolic pressure is 34.8 mmHg.   3. The mitral valve is grossly normal. Trivial mitral valve regurgitation.   4. The  aortic valve is grossly normal. Aortic valve regurgitation is not visualized. Aortic valve sclerosis/calcification is present, without any evidence of aortic stenosis.   TELEMETRY reviewed by me 08/05/2023: Atrial fibrillation, rate 90s -100s  EKG reviewed by me: Pre-op: Sinus Rhythm, rate 60 bpm       Postop: Atrial fibrillation, rate 125 bpm  Data reviewed by me 08/05/2023: last 24h vitals tele labs imaging I/O ED provider note, admission H&P, ortho notes  Principal Problem:   Femur fracture (HCC) Active Problems:   Hypothyroid   Diabetes mellitus with hyperglycemia (HCC)   Hypotension   Chronic obstructive pulmonary disease (HCC)   Dementia (HCC)   AKI (acute kidney injury) (HCC)   Protein-calorie malnutrition, unspecified severity (HCC)   Diastolic CHF with preserved left ventricular function, NYHA class 2 (HCC)   Paroxysmal atrial fibrillation (HCC)   Elevated troponin    ASSESSMENT AND PLAN:  Vanessa [name] is a 89 y.o. female  with a past medical history of chronic combined systolic and diastolic heart failure, paroxysmal atrial fibrillation, Hx SVT, chronic hypotension on midodrine, hyperlipidemia, COPD, type 2 diabetes mellitus who presented to the ED on 08/04/2023 following an unwitnessed fall that resulted in a fracture of the left femoral neck requiring surgery. In the setting of left hip pain patient went into atrial fibrillation, this is not new for the patient. Cardiology was consulted for further evaluation.  # Left femur fracture s/p ORIF (04/13) # Frequent falls # Paroxysmal atrial fibrillation # Chronic hypotension (Midodrine at home) # Chronic combined systolic and diastolic heart failure (EF 45-50%) Patient reports to the ED after an unwitnessed fall brought in by EMS.  Patient reports she does not remember falling or having a procedure. BNP mildy elevated 320.  Troponins mildly elevated and flat 65 > 56. Initially in NSR on EKG but in the setting of left hip pain  patient went into atrial fibrillation with RVR rates 120s to 130s.  Patient was given 1 dose of IV metoprolol 2.5 mg for rate control.  S/p left femoral fixation patient developed hypotension and was placed on phenylephrine gtt.  At 0640 today phenylephrine was stopped and patient resumed on home midodrine.  At the time my evaluation this morning patient states she feels good with no pain or concerns.  Heart rate is improving and BP soft. -Start IV digoxin 0.25 mg now, then 0.125 mg in 6 hours. -Defer use of anticoagulation due to high risk of bleeding/frequent falls. -Continue home midodrine. Plan to resume home metoprolol as able. -Resume GDMT as BP allows.   This patient's plan of care was discussed and created with Dr. Juliann Pares and he is in agreement.  Signed: Gale Journey, PA-C  08/05/2023, 10:13 AM [facility]

## 2023-08-05 NOTE — Progress Notes (Signed)
 PROGRESS NOTE    Vanessa [name]  ZOX:[id number] DOB: 1934-07-04 DOA: 08/04/2023 PCP: [name], MD  Outpatient Specialists: cardiology, pulmonology    Brief Narrative:   From admission h and p  Vanessa [name] is a 89 y.o. female with medical history significant of Chronic systolic and diastolic CHF, COPD, SVT, type 2 diabetes, hypothyroidism, atrial fibrillation not on AC, chronic hypotension on midodrine, who presents to the ED due to unwitnessed fall.   Vanessa [name] states she cannot recall exactly what happened with the fall.  Per chart review, she was sitting in a recliner and tried to get up without help and fell forward landing on her left side.  Due to this, EMS was called.  At this time, Vanessa [name] endorses left hip pain but otherwise denies chest pain, shortness of breath, dizziness, headache, nausea, vomiting.  Assessment & Plan:   Principal Problem:   Femur fracture (HCC) Active Problems:   Paroxysmal atrial fibrillation (HCC)   AKI (acute kidney injury) (HCC)   Elevated troponin   Diastolic CHF with preserved left ventricular function, NYHA class 2 (HCC)   Hypothyroid   Diabetes mellitus with hyperglycemia (HCC)   Hypotension   Chronic obstructive pulmonary disease (HCC)   Dementia (HCC)   Protein-calorie malnutrition, unspecified severity (HCC)  # Left intertrochanteric femur fracture # Fall 2/2 fall, pod #1. Broke right hip October of last year. - hgb appropriate drop today to 10s from baseline 13s, ctm - lovenox - PT/OT - pain control - bowel regimen - CT head as fall was unwitnessed and patient is baseline demented - ABI LLE as nurse unable to find doplerable pulse  # a-fib with RVR Required pressors overnight, now weaned off and hemodynamically stable, hr low 100s - continue fluids - defer therapeutics to cardiology. On metoprolol at home  # AKI # Elevated lactate Improving - continue fluids  # Dementia Without behavioral  disturbance. Resides at [name] - home memantine  # Chronic hypotension On midodrine at home - continue midodrine  # ILD 2/2 recurrent aspiration pneumonia - dysphagia 3 diet, slp swallow eval - monitor  # T2DM With hyperglycemia, A1c 8s earlier this year - SSI  # Hypothyroid TSH wnl - home levothyroxine  # HFpEF Does not appear exacerbated - home prn torsemide and jardiance on hold  DVT prophylaxis: lovenox Code Status: dnr Family Communication: daughters updated @ bedside  Level of care: ICU Status is: Inpatient Remains inpatient appropriate because: severity of illness    Consultants:  cardiology  Procedures: Left intertrochanteric fracture fixation with IM rod on 4/13 w/ Dr. Annabell Key  Antimicrobials:  Cefazolin surgical ppx    Subjective: No pain, confused  Objective: Vitals:   08/05/23 0715 08/05/23 0730 08/05/23 0745 08/05/23 0751  BP:  (!) 87/62  94/65  Pulse: 90 90  (!) 111  Resp: 19 (!) 22 (!) 22 14  Temp:    (!) 97.3 F (36.3 C)  TempSrc:    Axillary  SpO2: 99% 97%  90%  Weight:      Height:        Intake/Output Summary (Last 24 hours) at 08/05/2023 0836 Last data filed at 08/05/2023 0600 Gross per 24 hour  Intake 1967.68 ml  Output 600 ml  Net 1367.68 ml   Filed Weights   08/04/23 1207 08/04/23 2204  Weight: 56.3 kg 55.7 kg    Examination:  General exam: Appears calm and comfortable  Respiratory system: Clear to auscultation. Respiratory effort normal. Cardiovascular  system: S1 & S2 heard, tachycardic, irreg Gastrointestinal system: Abdomen is nondistended, soft and nontender.   Central nervous system: moving all 4 Extremities: warm Skin: No rashes, lesions or ulcers Psychiatry: Judgement and insight appear normal. Mood & affect appropriate.     Data Reviewed: I have personally reviewed following labs and imaging studies  CBC: Recent Labs  Lab 08/04/23 1312 08/04/23 2214 08/05/23 0435  WBC 8.8 19.0* 16.0*   HGB 14.2 11.8* 10.6*  HCT 44.7 37.5 34.4*  MCV 90.3 92.1 91.0  PLT 276 261 210   Basic Metabolic Panel: Recent Labs  Lab 08/04/23 1312 08/04/23 2214 08/05/23 0435  NA 138 137 139  K 4.0 4.5 4.3  CL 98 99 105  CO2 28 24 22   GLUCOSE 230* 289* 234*  BUN 21 24* 24*  CREATININE 1.23* 1.47* 1.28*  CALCIUM 9.2 8.5* 8.1*   GFR: Estimated Creatinine Clearance: 27.2 mL/min (A) (by C-G formula based on SCr of 1.28 mg/dL (H)). Liver Function Tests: No results for input(s): "AST", "ALT", "ALKPHOS", "BILITOT", "PROT", "ALBUMIN" in the last 168 hours. No results for input(s): "LIPASE", "AMYLASE" in the last 168 hours. No results for input(s): "AMMONIA" in the last 168 hours. Coagulation Profile: Recent Labs  Lab 08/04/23 1705  INR 1.1   Cardiac Enzymes: No results for input(s): "CKTOTAL", "CKMB", "CKMBINDEX", "TROPONINI" in the last 168 hours. BNP (last 3 results) No results for input(s): "PROBNP" in the last 8760 hours. HbA1C: No results for input(s): "HGBA1C" in the last 72 hours. CBG: Recent Labs  Lab 08/04/23 1214 08/04/23 2026 08/04/23 2206  GLUCAP 253* 232* 245*   Lipid Profile: No results for input(s): "CHOL", "HDL", "LDLCALC", "TRIG", "CHOLHDL", "LDLDIRECT" in the last 72 hours. Thyroid Function Tests: Recent Labs    08/05/23 0435  TSH 2.176  FREET4 0.95   Anemia Panel: No results for input(s): "VITAMINB12", "FOLATE", "FERRITIN", "TIBC", "IRON", "RETICCTPCT" in the last 72 hours. Urine analysis:    Component Value Date/Time   COLORURINE YELLOW (A) 08/04/2023 2344   APPEARANCEUR HAZY (A) 08/04/2023 2344   LABSPEC 1.017 08/04/2023 2344   PHURINE 5.0 08/04/2023 2344   GLUCOSEU >=500 (A) 08/04/2023 2344   GLUCOSEU NEGATIVE 11/26/2022 1327   HGBUR NEGATIVE 08/04/2023 2344   BILIRUBINUR NEGATIVE 08/04/2023 2344   BILIRUBINUR negative 11/07/2022 1339   BILIRUBINUR neg 02/27/2022 1508   KETONESUR NEGATIVE 08/04/2023 2344   PROTEINUR NEGATIVE 08/04/2023 2344    UROBILINOGEN 0.2 11/26/2022 1327   NITRITE NEGATIVE 08/04/2023 2344   LEUKOCYTESUR NEGATIVE 08/04/2023 2344   Sepsis Labs: @LABRCNTIP (procalcitonin:4,lacticidven:4)  ) Recent Results (from the past 240 hours)  MRSA Next Gen by PCR, Nasal     Status: None   Collection Time: 08/04/23 10:13 PM   Specimen: Nasal Mucosa; Nasal Swab  Result Value Ref Range Status   MRSA by PCR Next Gen NOT DETECTED NOT DETECTED Final    Comment: (NOTE) The GeneXpert MRSA Assay (FDA approved for NASAL specimens only), is one component of a comprehensive MRSA colonization surveillance program. It is not intended to diagnose MRSA infection nor to guide or monitor treatment for MRSA infections. Test performance is not FDA approved in patients less than 12 years old. Performed at [facility], [address] [place], [place] [address]   Culture, blood (Routine X 2) w Reflex to ID Panel     Status: None (Preliminary result)   Collection Time: 08/05/23  3:02 AM   Specimen: BLOOD  Result Value Ref Range Status   Specimen Description  BLOOD BLOOD LEFT HAND low volume hard stick  Final   Special Requests   Final    BOTTLES DRAWN AEROBIC ONLY Blood Culture results may not be optimal due to an inadequate volume of blood received in culture bottles   Culture   Final    NO GROWTH <12 HOURS Performed at [name], [address], [place], [place] [address]    Report Status PENDING  Incomplete         Radiology Studies: DG HIP UNILAT WITH PELVIS 2-3 VIEWS LEFT Result Date: 08/04/2023 CLINICAL DATA:  Left intramedullary nail EXAM: DG HIP (WITH OR WITHOUT PELVIS) 2-3V LEFT COMPARISON:  Left hip x-ray 08/04/2023. FINDINGS: Three intraoperative fluoroscopic views of the left hip. Left femoral intramedullary nail and hip screw replaced. Alignment is anatomic as can be seen. IMPRESSION: Left femoral intramedullary nail and hip screw replaced. Electronically Signed   By: Tyron Gallon M.D.    On: 08/04/2023 20:27   DG C-Arm 1-60 Min-No Report Result Date: 08/04/2023 Fluoroscopy was utilized by the requesting physician.  No radiographic interpretation.   DG C-Arm 1-60 Min-No Report Result Date: 08/04/2023 Fluoroscopy was utilized by the requesting physician.  No radiographic interpretation.   DG Chest Portable 1 View Result Date: 08/04/2023 CLINICAL DATA:  Pain, LEFT hip fall. EXAM: PORTABLE CHEST 1 VIEW COMPARISON:  Radiograph 02/09/2023 FINDINGS: Large hiatal hernia posterior to the heart. Entire stomach is above the hemidiaphragm. No pulmonary edema or pleural fluid. No pneumothorax. No acute osseous abnormality. IMPRESSION: 1. No acute cardiopulmonary process. 2. Large hiatal hernia. Electronically Signed   By: Deboraha Fallow M.D.   On: 08/04/2023 13:11   DG Hip Unilat With Pelvis 2-3 Views Left Result Date: 08/04/2023 CLINICAL DATA:  Fall, LEFT hip pain EXAM: DG HIP (WITH OR WITHOUT PELVIS) 2-3V LEFT COMPARISON:  None Available. FINDINGS: Intertrochanteric fracture of the LEFT femoral neck. Mild caudad migration of the femur. No angulation. Avulsion of the lesser trochanter. Lucency in the LEFT inferior pubic ramus. Potential aggressive osseous lesion. Internal fixation of the RIGHT hip. IMPRESSION: Intertrochanteric fracture of the LEFT femoral neck. Lucency in the LEFT inferior pubic ramus. Potential aggressive osseous lesion. Electronically Signed   By: Deboraha Fallow M.D.   On: 08/04/2023 13:09        Scheduled Meds:  celecoxib  200 mg Oral BID   Chlorhexidine Gluconate Cloth  6 each Topical Daily   cholecalciferol  2,000 Units Oral Daily   enoxaparin (LOVENOX) injection  30 mg Subcutaneous Q24H   ferrous sulfate  325 mg Oral Q breakfast   gabapentin  200 mg Oral BID   insulin aspart  0-15 Units Subcutaneous Q4H   levothyroxine  25 mcg Oral Q0600   midodrine  10 mg Oral TID AC   senna  1 tablet Oral BID   Continuous Infusions:  sodium chloride 75 mL/hr at  08/05/23 0600    ceFAZolin (ANCEF) IV 2 g (08/05/23 0303)   phenylephrine (NEO-SYNEPHRINE) Adult infusion Stopped (08/05/23 0640)     LOS: 1 day     Raymonde Calico, MD [facility]   If 7PM-7AM, please contact night-coverage [email or web address] Password [facility] 08/05/2023, 8:36 AM

## 2023-08-05 NOTE — Evaluation (Signed)
 Occupational Therapy Evaluation Patient Details Name: Vanessa [name] MRN: [id number] DOB: 06-14-1934 Today's Date: 08/05/2023   History of Present Illness   Patient is a 89 year old female with fall with left hip fracture s/p left hip repair with post-op A-fib with RVR and hypotension requiring vasopressors in PACU. History of heart failure, COPD, dementia, diabetes mellitus, pulmonary hypertension.    Clinical Impressions Vanessa [name] was seen for OT/PT co-evaluation this date. Prior to hospital admission, pt required supervision for mobility, assist for standing and ADLs. Pt lives at [facility] nursing facility. Pt currently requires MAX Ax2 bed mobility and MAX A don B socks seated EOB. Initial MOD A static sitting balance improving to CGA. MOD A x2 + HHA for sit<>stand at EOB, tolerates ~1 min. SETUP bed level grooming. Pt would benefit from skilled OT to address noted impairments and functional limitations (see below for any additional details). Upon hospital discharge, recommend OT follow up <3 hours/day.    If plan is discharge home, recommend the following:   Two people to help with walking and/or transfers;Two people to help with bathing/dressing/bathroom     Functional Status Assessment   Patient has had a recent decline in their functional status and demonstrates the ability to make significant improvements in function in a reasonable and predictable amount of time.     Equipment Recommendations   Other (comment) (defer)     Recommendations for Other Services         Precautions/Restrictions   Precautions Precautions: Fall Recall of Precautions/Restrictions: Impaired Restrictions Weight Bearing Restrictions Per Provider Order: Yes LLE Weight Bearing Per Provider Order: Partial weight bearing LLE Partial Weight Bearing Percentage or Pounds: 50%     Mobility Bed Mobility Overal bed mobility: Needs Assistance Bed Mobility: Supine to Sit, Sit to Supine      Supine to sit: Max assist, +2 for physical assistance Sit to supine: Max assist, +2 for physical assistance        Transfers Overall transfer level: Needs assistance Equipment used: 2 person hand held assist Transfers: Sit to/from Stand Sit to Stand: Mod assist, +2 physical assistance           General transfer comment: cues for technique.      Balance Overall balance assessment: Needs assistance Sitting-balance support: Feet supported Sitting balance-Leahy Scale: Poor Sitting balance - Comments: initial +1 assist improves to CGA   Standing balance support: Bilateral upper extremity supported Standing balance-Leahy Scale: Poor                             ADL either performed or assessed with clinical judgement   ADL Overall ADL's : Needs assistance/impaired                                       General ADL Comments: MAX A don B socks seated EOB. MOD A x2 + HHA for simulated BSC t/f      Pertinent Vitals/Pain Pain Assessment Pain Assessment: Faces Faces Pain Scale: Hurts even more Pain Location: L hip Pain Descriptors / Indicators: Discomfort Pain Intervention(s): Monitored during session, Limited activity within patient's tolerance, Ice applied     Extremity/Trunk Assessment Upper Extremity Assessment Upper Extremity Assessment: Generalized weakness   Lower Extremity Assessment Lower Extremity Assessment: Generalized weakness RLE Deficits / Details: can complete LAQ while sitting. generalized weakness throughout LLE  Deficits / Details: pain with active movement of L hip       Communication Communication Communication: No apparent difficulties   Cognition Arousal: Alert Behavior During Therapy: WFL for tasks assessed/performed Cognition: History of cognitive impairments                               Following commands: Impaired Following commands impaired: Follows one step commands with increased time      Cueing  General Comments   Cueing Techniques: Verbal cues;Tactile cues;Visual cues  no dizziness is reported with activity. blood pressure 77/61 after return to bed with no dizziness reported with activity   Exercises     Shoulder Instructions      Home Living Family/patient expects to be discharged to:: Skilled nursing facility                                 Additional Comments: long term care at [facility] per daughter      Prior Functioning/Environment Prior Level of Function : Needs assist             Mobility Comments: ambulatory with 2 wheeled walker at the SNF with supervision from staff. lifting assistance requried for standing ADLs Comments: assistance required    OT Problem List: Decreased strength;Decreased range of motion;Decreased activity tolerance;Impaired balance (sitting and/or standing);Decreased safety awareness   OT Treatment/Interventions: Therapeutic exercise;Energy conservation;DME and/or AE instruction;Self-care/ADL training;Therapeutic activities;Balance training;Patient/family education      OT Goals(Current goals can be found in the care plan section)   Acute Rehab OT Goals Patient Stated Goal: to walk OT Goal Formulation: With patient/family Time For Goal Achievement: 08/19/23 Potential to Achieve Goals: Fair ADL Goals Pt Will Perform Grooming: with set-up;with supervision;sitting (tolerate 10 mins) Pt Will Perform Lower Body Dressing: with min assist;with caregiver independent in assisting;sit to/from stand Pt Will Transfer to Toilet: with min assist;ambulating;regular height toilet   OT Frequency:  Min 2X/week    Co-evaluation PT/OT/SLP Co-Evaluation/Treatment: Yes Reason for Co-Treatment: To address functional/ADL transfers PT goals addressed during session: Mobility/safety with mobility OT goals addressed during session: ADL's and self-care      AM-PAC OT "6 Clicks" Daily Activity     Outcome Measure Help from  another person eating meals?: None Help from another person taking care of personal grooming?: A Little Help from another person toileting, which includes using toliet, bedpan, or urinal?: A Lot Help from another person bathing (including washing, rinsing, drying)?: A Lot Help from another person to put on and taking off regular upper body clothing?: A Little Help from another person to put on and taking off regular lower body clothing?: A Lot 6 Click Score: 16   End of Session Equipment Utilized During Treatment: Oxygen Nurse Communication: Mobility status  Activity Tolerance: Patient tolerated treatment well Patient left: in bed;with call bell/phone within reach;with bed alarm set;with family/visitor present  OT Visit Diagnosis: Other abnormalities of gait and mobility (R26.89);Muscle weakness (generalized) (M62.81)                Time: 1610-9604 OT Time Calculation (min): 18 min Charges:  OT General Charges $OT Visit: 1 Visit OT Evaluation $OT Eval Moderate Complexity: 1 Mod  Kathie Dike, M.S. OTR/L  08/05/23, 2:16 PM  ascom [phone]

## 2023-08-05 NOTE — Progress Notes (Signed)
 Subjective: 1 Day Post-Op Procedure(s) (LRB): FIXATION, FRACTURE, INTERTROCHANTERIC, WITH INTRAMEDULLARY ROD (Left) Patient is awake and alert and sitting up in bed in ICU.  Her daughter and other family are present at the bedside.  She continues to have fluctuating blood pressure and heart rate problems due to her atrial fibrillation.  However she stood with PT this morning.  Hemoglobin is stable.  Dressing is dry and the hip is stable.  Patient reports pain as mild.  Objective:   VITALS:   Vitals:   08/05/23 1100 08/05/23 1115  BP: (!) 78/67 92/74  Pulse:    Resp: 18 20  Temp:    SpO2:      Neurologically intact Sensation intact distally Dorsiflexion/Plantar flexion intact Incision: dressing C/D/I  LABS Recent Labs    08/04/23 1312 08/04/23 2214 08/05/23 0435  HGB 14.2 11.8* 10.6*  HCT 44.7 37.5 34.4*  WBC 8.8 19.0* 16.0*  PLT 276 261 210    Recent Labs    08/04/23 1312 08/04/23 2214 08/05/23 0435  NA 138 137 139  K 4.0 4.5 4.3  BUN 21 24* 24*  CREATININE 1.23* 1.47* 1.28*  GLUCOSE 230* 289* 234*    Recent Labs    08/04/23 1705  INR 1.1     Assessment/Plan: 1 Day Post-Op Procedure(s) (LRB): FIXATION, FRACTURE, INTERTROCHANTERIC, WITH INTRAMEDULLARY ROD (Left)   Advance diet Up with therapy Discharge to SNF when medically stabilized. 50% weightbearing left leg with walker.

## 2023-08-06 DIAGNOSIS — S72145A Nondisplaced intertrochanteric fracture of left femur, initial encounter for closed fracture: Secondary | ICD-10-CM | POA: Diagnosis not present

## 2023-08-06 DIAGNOSIS — E440 Moderate protein-calorie malnutrition: Secondary | ICD-10-CM | POA: Insufficient documentation

## 2023-08-06 LAB — BASIC METABOLIC PANEL WITH GFR
Anion gap: 9 (ref 5–15)
BUN: 26 mg/dL — ABNORMAL HIGH (ref 8–23)
CO2: 23 mmol/L (ref 22–32)
Calcium: 8.3 mg/dL — ABNORMAL LOW (ref 8.9–10.3)
Chloride: 106 mmol/L (ref 98–111)
Creatinine, Ser: 0.9 mg/dL (ref 0.44–1.00)
GFR, Estimated: >60 mL/min
Glucose, Bld: 86 mg/dL (ref 70–99)
Potassium: 3.9 mmol/L (ref 3.5–5.1)
Sodium: 138 mmol/L (ref 135–145)

## 2023-08-06 LAB — GLUCOSE, CAPILLARY
Glucose-Capillary: 72 mg/dL (ref 70–99)
Glucose-Capillary: 101 mg/dL — ABNORMAL HIGH (ref 70–99)
Glucose-Capillary: 211 mg/dL — ABNORMAL HIGH (ref 70–99)
Glucose-Capillary: 181 mg/dL — ABNORMAL HIGH (ref 70–99)
Glucose-Capillary: 136 mg/dL — ABNORMAL HIGH (ref 70–99)

## 2023-08-06 LAB — CBC
HCT: 27.6 % — ABNORMAL LOW (ref 36.0–46.0)
Hemoglobin: 8.8 g/dL — ABNORMAL LOW (ref 12.0–15.0)
MCH: 28.5 pg (ref 26.0–34.0)
MCHC: 31.9 g/dL (ref 30.0–36.0)
MCV: 89.3 fL (ref 80.0–100.0)
Platelets: 177 K/uL (ref 150–400)
RBC: 3.09 MIL/uL — ABNORMAL LOW (ref 3.87–5.11)
RDW: 14.2 % (ref 11.5–15.5)
WBC: 11.7 K/uL — ABNORMAL HIGH (ref 4.0–10.5)
nRBC: 0 % (ref 0.0–0.2)

## 2023-08-06 LAB — VITAMIN D 25 HYDROXY (VIT D DEFICIENCY, FRACTURES): Vit D, 25-Hydroxy: 83.87 ng/mL (ref 30–100)

## 2023-08-06 LAB — DIGOXIN LEVEL: Digoxin Level: 1.6 ng/mL (ref 0.8–2.0)

## 2023-08-06 MED ORDER — ADULT MULTIVITAMIN W/MINERALS CH
1.0000 | ORAL_TABLET | Freq: Every day | ORAL | Status: DC
  Administered 2023-08-07: 1 via ORAL
  Filled 2023-08-06: qty 1

## 2023-08-06 MED ORDER — INSULIN ASPART 100 UNIT/ML IJ SOLN
0.0000 [IU] | Freq: Three times a day (TID) | INTRAMUSCULAR | Status: DC
  Administered 2023-08-06: 3 [IU] via SUBCUTANEOUS
  Administered 2023-08-06: 2 [IU] via SUBCUTANEOUS
  Administered 2023-08-07: 1 [IU] via SUBCUTANEOUS
  Administered 2023-08-07: 2 [IU] via SUBCUTANEOUS
  Filled 2023-08-06 (×4): qty 1

## 2023-08-06 MED ORDER — DIGOXIN 125 MCG PO TABS
0.1250 mg | ORAL_TABLET | Freq: Every day | ORAL | Status: DC
  Administered 2023-08-06 – 2023-08-07 (×2): 0.125 mg via ORAL
  Filled 2023-08-06 (×2): qty 1

## 2023-08-06 MED ORDER — METOPROLOL SUCCINATE ER 25 MG PO TB24
12.5000 mg | ORAL_TABLET | Freq: Every day | ORAL | Status: DC
  Administered 2023-08-06 – 2023-08-07 (×2): 12.5 mg via ORAL
  Filled 2023-08-06: qty 1
  Filled 2023-08-06: qty 0.5

## 2023-08-06 MED ORDER — ENSURE ENLIVE PO LIQD
237.0000 mL | Freq: Three times a day (TID) | ORAL | Status: DC
  Administered 2023-08-06: 237 mL via ORAL

## 2023-08-06 MED ORDER — SODIUM CHLORIDE 0.9 % IV SOLN: INTRAVENOUS | Status: AC

## 2023-08-06 MED ORDER — INSULIN ASPART 100 UNIT/ML IJ SOLN: 0.0000 [IU] | Freq: Every day | INTRAMUSCULAR | Status: DC

## 2023-08-06 NOTE — [place] FL2 (Signed)
 [place] MEDICAID FL2 LEVEL OF CARE FORM     IDENTIFICATION  Patient Name: Vanessa [name] Birthdate: 1934-07-06 Sex: female Admission Date (Current Location): 08/04/2023  [place] and [place] Number:  Chiropodist and Address:  [facility], [address], [place], [place] [address]      Provider Number: [id number]  Attending Physician Name and Address:  Kathrynn Running, MD  Relative Name and Phone Number:  Patsy Lager [phone]    Current Level of Care: Hospital Recommended Level of Care: Skilled Nursing Facility Prior Approval Number:    Date Approved/Denied:   PASRR Number: [id number]  Discharge Plan: SNF    Current Diagnoses: Patient Active Problem List   Diagnosis Date Noted   Femur fracture (HCC) 08/04/2023   Paroxysmal atrial fibrillation (HCC) 08/04/2023   Elevated troponin 08/04/2023   Diastolic CHF with preserved left ventricular function, NYHA class 2 (HCC) 02/21/2023   Protein-calorie malnutrition, unspecified severity (HCC) 02/18/2023   History of chronic 02/15/2023   Femur fracture, right (HCC) 02/15/2023   Acute metabolic encephalopathy 02/15/2023   ABLA (acute blood loss anemia) 02/15/2023   Closed displaced intertrochanteric fracture of right femur (HCC) 02/11/2023   Septic shock (HCC) 02/07/2023   History of UTI 11/28/2022   Chronic obstructive pulmonary disease (HCC) 08/03/2022   Hypokalemia 08/03/2022   Syncope 08/03/2022   Dementia (HCC) 08/03/2022   Prolonged QT interval 08/03/2022   AKI (acute kidney injury) (HCC) 08/03/2022   Dysuria 09/24/2021   Dizziness of unknown cause 09/21/2021   B12 deficiency 03/22/2019   Mild cognitive impairment 03/18/2019   Pelvic fracture (HCC) 10/05/2018   Abnormal weight loss 08/19/2017   Hypotension 01/18/2016   Fatigue 07/12/2015   Diabetes mellitus with hyperglycemia (HCC) 05/25/2014   OA (osteoarthritis) 05/25/2014   Adjustment disorder 01/07/2014    Environmental allergies 12/13/2013   Arthritis 06/27/2013   Carotid stenosis 11/04/2012   Migraine 11/12/2011   Medicare annual wellness visit, subsequent 05/15/2011   Osteopenia 01/25/2011   Hypothyroid 01/25/2011   Advance directive discussed with patient 01/24/2011   SVT (supraventricular tachycardia) (HCC) 08/17/2010   Hyperlipidemia 08/17/2010    Orientation RESPIRATION BLADDER Height & Weight     Self, Time  Normal Continent Weight: 55.7 kg Height:  5\' 4"  (162.6 cm)  BEHAVIORAL SYMPTOMS/MOOD NEUROLOGICAL BOWEL NUTRITION STATUS      Incontinent  (See Discharge Summary)  AMBULATORY STATUS COMMUNICATION OF NEEDS Skin   Extensive Assist Verbally Surgical wounds                       Personal Care Assistance Level of Assistance  Bathing, Feeding, Dressing Bathing Assistance: Maximum assistance Feeding assistance: Limited assistance Dressing Assistance: Maximum assistance     Functional Limitations Info  Sight, Hearing, Speech Sight Info: Adequate Hearing Info: Adequate Speech Info: Adequate    SPECIAL CARE FACTORS FREQUENCY  PT (By licensed PT), OT (By licensed OT)     PT Frequency: 5x week OT Frequency: 5x week            Contractures Contractures Info: Not present    Additional Factors Info  Code Status, Allergies Code Status Info: DNR-Limited Allergies Info: Codeine, Cortisone, Decongestant (Pseudoephedrine Hcl), Donepezil, Naproxen, Simvastatin, Statins           Current Medications (08/06/2023):  This is the current hospital active medication list Current Facility-Administered Medications  Medication Dose Route Frequency Provider Last Rate Last Admin   0.9 %  sodium chloride  infusion   Intravenous Continuous Kathrynn Running, MD 50 mL/hr at 08/06/23 1151 Infusion Verify at 08/06/23 1151   acetaminophen (TYLENOL) tablet 325-650 mg  325-650 mg Oral Q6H PRN Deeann Saint, MD   650 mg at 08/06/23 0944   alum & mag hydroxide-simeth  (MAALOX/MYLANTA) 200-200-20 MG/5ML suspension 30 mL  30 mL Oral Q4H PRN Deeann Saint, MD       bisacodyl (DULCOLAX) suppository 10 mg  10 mg Rectal Daily PRN Deeann Saint, MD       Chlorhexidine Gluconate Cloth 2 % PADS 6 each  6 each Topical Daily Verdene Lennert, MD   6 each at 08/06/23 0933   cholecalciferol (VITAMIN D3) tablet 2,000 Units  2,000 Units Oral Daily Deeann Saint, MD   2,000 Units at 08/06/23 0905   digoxin (LANOXIN) tablet 0.125 mg  0.125 mg Oral Daily Hudson, Caralyn, PA-C   0.125 mg at 08/06/23 1200   feeding supplement (ENSURE ENLIVE / ENSURE PLUS) liquid 237 mL  237 mL Oral TID BM Wouk, Wilfred Curtis, MD   237 mL at 08/06/23 1417   ferrous sulfate tablet 325 mg  325 mg Oral Q breakfast Deeann Saint, MD   325 mg at 08/06/23 0800   gabapentin (NEURONTIN) capsule 200 mg  200 mg Oral BID Deeann Saint, MD   200 mg at 08/06/23 1610   HYDROcodone-acetaminophen (NORCO/VICODIN) 5-325 MG per tablet 1-2 tablet  1-2 tablet Oral Q4H PRN Deeann Saint, MD       insulin aspart (novoLOG) injection 0-5 Units  0-5 Units Subcutaneous QHS Wouk, Wilfred Curtis, MD       insulin aspart (novoLOG) injection 0-9 Units  0-9 Units Subcutaneous TID WC Kathrynn Running, MD   3 Units at 08/06/23 1208   ipratropium-albuterol (DUONEB) 0.5-2.5 (3) MG/3ML nebulizer solution 3 mL  3 mL Nebulization Q4H PRN Deeann Saint, MD       levothyroxine (SYNTHROID) tablet 25 mcg  25 mcg Oral Q0600 Deeann Saint, MD   25 mcg at 08/06/23 0511   magnesium hydroxide (MILK OF MAGNESIA) suspension 30 mL  30 mL Oral Daily PRN Deeann Saint, MD       memantine [facility]) tablet 5 mg  5 mg Oral BID Kathrynn Running, MD   5 mg at 08/06/23 9604   menthol-cetylpyridinium (CEPACOL) lozenge 3 mg  1 lozenge Oral PRN Deeann Saint, MD       Or   phenol (CHLORASEPTIC) mouth spray 1 spray  1 spray Mouth/Throat PRN Deeann Saint, MD       metoCLOPramide (REGLAN) tablet 5-10 mg  5-10 mg Oral Q8H PRN Deeann Saint, MD        Or   metoCLOPramide (REGLAN) injection 5-10 mg  5-10 mg Intravenous Q8H PRN Deeann Saint, MD   10 mg at 08/05/23 5409   metoprolol succinate (TOPROL-XL) 24 hr tablet 12.5 mg  12.5 mg Oral Daily Hudson, Caralyn, PA-C   12.5 mg at 08/06/23 0933   midodrine (PROAMATINE) tablet 10 mg  10 mg Oral TID Clint Guy, MD   10 mg at 08/06/23 1208   morphine (PF) 2 MG/ML injection 0.5-1 mg  0.5-1 mg Intravenous Q2H PRN Deeann Saint, MD       Melene Muller ON 08/07/2023] multivitamin with minerals tablet 1 tablet  1 tablet Oral Daily Wouk, Wilfred Curtis, MD       senna Mancel Parsons) tablet 8.6 mg  1 tablet Oral BID Deeann Saint, MD   8.6 mg at 08/06/23 [phone]  sodium phosphate (FLEET) enema 1 enema  1 enema Rectal Once PRN Marlynn Singer, MD         Discharge Medications: Please see discharge summary for a list of discharge medications.  Relevant Imaging Results:  Relevant Lab Results:   Additional Information [id number]  Merle Whitehorn A Montario Zilka, RN

## 2023-08-06 NOTE — Plan of Care (Signed)
  Problem: Education: Goal: Ability to describe self-care measures that may prevent or decrease complications (Diabetes Survival Skills Education) will improve Outcome: Not Progressing   Problem: Education: Goal: Ability to describe self-care measures that may prevent or decrease complications (Diabetes Survival Skills Education) will improve Outcome: Not Progressing   Problem: Fluid Volume: Goal: Ability to maintain a balanced intake and output will improve Outcome: Progressing   Problem: Fluid Volume: Goal: Ability to maintain a balanced intake and output will improve Outcome: Progressing   Problem: Metabolic: Goal: Ability to maintain appropriate glucose levels will improve Outcome: Progressing   Problem: Metabolic: Goal: Ability to maintain appropriate glucose levels will improve Outcome: Progressing   Problem: Nutritional: Goal: Maintenance of adequate nutrition will improve Outcome: Progressing   Problem: Nutritional: Goal: Maintenance of adequate nutrition will improve Outcome: Progressing   Problem: Skin Integrity: Goal: Risk for impaired skin integrity will decrease Outcome: Progressing   Problem: Skin Integrity: Goal: Risk for impaired skin integrity will decrease Outcome: Progressing

## 2023-08-06 NOTE — Progress Notes (Signed)
 Initial Nutrition Assessment  DOCUMENTATION CODES:   Non-severe (moderate) malnutrition in context of chronic illness  INTERVENTION:   Ensure Enlive po TID, each supplement provides 350 kcal and 20 grams of protein.  Ice cream with meals   MVI po daily   Pt is at refeed risk; recommend monitor potassium, magnesium and phosphorus labs daily until stable  Daily weights   NUTRITION DIAGNOSIS:   Moderate Malnutrition related to chronic illness as evidenced by moderate fat depletion, moderate muscle depletion.  GOAL:   Patient will meet greater than or equal to 90% of their needs  MONITOR:   PO intake, Supplement acceptance, Labs, Weight trends, I & O's, Skin  REASON FOR ASSESSMENT:   Consult Hip fracture protocol  ASSESSMENT:   89 y/o female with h/o hypothyroid disease, DM, COPD, dementia, CHF, PAF, HLD and R hip fracture who is admitted with AKI and L hip fracture after fall s/p surgical intervention 4/13.  Met with pt and pt's daughter in room today. Pt sitting up and in good spirits laughing and making jokes. Pt is having some confusion today. Pt reports that she has good appetite and oral intake at baseline and that she loves the food at [facility]  Daughter reports that patient ate 85% of her breakfast this morning which included two pancakes, 50% grits and a piece of sausage. Pt reports that she does not drink any supplements at baseline. RD discussed with pt and daughter the importance of adequate nutrition needed to preserve lean muscle and support post op healing. Pt is agreeable to trying supplements in hospital. Pt is likely at refeed risk. No new weight since admission; will monitor daily weights as pt with h/o CHF.    Medications reviewed and include: D3, ferrous sulfate, insulin, synthroid, midodrine, senokot, NaCl @50ml /hr  Labs reviewed: K 3.9 wnl, BUN 26(H) BNP- 320.6(H)- 4/13 Wbc- 11.7(H), Hgb 8.8(L), Hct 27.6(L) Cbgs- 211, 101, 72 x 24 hrs  AIC 8.3-  3/20  NUTRITION - FOCUSED PHYSICAL EXAM:  Flowsheet Row Most Recent Value  Orbital Region No depletion  Upper Arm Region Moderate depletion  Thoracic and Lumbar Region Mild depletion  Buccal Region Mild depletion  Temple Region Moderate depletion  Clavicle Bone Region Moderate depletion  Clavicle and Acromion Bone Region Moderate depletion  Scapular Bone Region Mild depletion  Dorsal Hand Mild depletion  Patellar Region Mild depletion  Anterior Thigh Region No depletion  Posterior Calf Region No depletion  Edema (RD Assessment) None  Hair Reviewed  Eyes Reviewed  Mouth Reviewed  Skin Reviewed  Nails Reviewed   Diet Order:   Diet Order             DIET DYS 3 Fluid consistency: Thin  Diet effective now                  EDUCATION NEEDS:   Education needs have been addressed  Skin:  Skin Assessment: Reviewed RN Assessment (incision L hip)  Last BM:  4/15- type 5  Height:   Ht Readings from Last 1 Encounters:  08/04/23 5\' 4"  (1.626 m)    Weight:   Wt Readings from Last 1 Encounters:  08/04/23 55.7 kg    Ideal Body Weight:  54.5 kg  BMI:  Body mass index is 21.08 kg/m.  Estimated Nutritional Needs:   Kcal:  1400-1600kcal/day  Protein:  70-80g/day  Fluid:  1.4-1.6L/day  Betsey Holiday MS, RD, LDN If unable to be reached, please send secure chat to "RD inpatient" available  from 8:00a-4:00p daily

## 2023-08-06 NOTE — Progress Notes (Signed)
 Physical Therapy Treatment Patient Details Name: Vanessa [name] MRN: [id number] DOB: 1935/01/04 Today's Date: 08/06/2023   History of Present Illness Patient is a 89 year old female with fall with left hip fracture s/p left hip repair with post-op A-fib with RVR and hypotension requiring vasopressors in PACU. History of heart failure, COPD, dementia, diabetes mellitus, pulmonary hypertension.    PT Comments  Patient is agreeable to PT session. She is more confused today but is cooperative with session. Patient continues to require physical assistance for mobility. Activity tolerance limited by fatigue and pain in the left hip. Recommend to continue PT to maximize independence. Rehabilitation < 3 hours/day recommended after this hospital stay.    If plan is discharge home, recommend the following: Two people to help with walking and/or transfers;A lot of help with bathing/dressing/bathroom;Assist for transportation;Help with stairs or ramp for entrance;Supervision due to cognitive status;Assistance with cooking/housework   Can travel by private vehicle     No  Equipment Recommendations  None recommended by PT    Recommendations for Other Services       Precautions / Restrictions Precautions Precautions: Fall Recall of Precautions/Restrictions: Impaired Restrictions Weight Bearing Restrictions Per Provider Order: Yes LLE Weight Bearing Per Provider Order: Partial weight bearing LLE Partial Weight Bearing Percentage or Pounds: 50%     Mobility  Bed Mobility Overal bed mobility: Needs Assistance Bed Mobility: Supine to Sit, Sit to Supine     Supine to sit: Max assist Sit to supine: Max assist   General bed mobility comments: assistance required for trunk and BLE support. verbal cues for technique    Transfers                   General transfer comment: not attempted. recommend to have +2 person for safety    Ambulation/Gait                   Stairs              Wheelchair Mobility     Tilt Bed    Modified Rankin (Stroke Patients Only)       Balance Overall balance assessment: Needs assistance Sitting-balance support: Feet supported Sitting balance-Leahy Scale: Fair Sitting balance - Comments: no loss of balance while sitting                                    Communication Communication Communication: No apparent difficulties  Cognition Arousal: Alert Behavior During Therapy: WFL for tasks assessed/performed   PT - Cognitive impairments: History of cognitive impairments                       PT - Cognition Comments: Patient is more confused than yesterday. She is disoriented to time, situation, place. She is able to follow commands with cues for redirection Following commands: Impaired Following commands impaired: Follows one step commands with increased time    Cueing Cueing Techniques: Verbal cues, Tactile cues, Visual cues  Exercises General Exercises - Lower Extremity Ankle Circles/Pumps: AROM, AAROM, Strengthening, Both, 10 reps, Seated (chair position of bed) Long Arc Quad: AAROM, Strengthening, Left, 5 reps, Seated (from edge of bed) Heel Slides: AAROM, Strengthening, Both, 10 reps, Seated (chair position of bed) Hip ABduction/ADduction: AROM, AAROM, Strengthening, Both, 10 reps, Seated (from chair position in bed) Other Exercises Other Exercises: exercises performed with cues for technique    General  Comments General comments (skin integrity, edema, etc.): Sp02 around 90 with activity on supplemental oxygen. no dizziness reported with activity      Pertinent Vitals/Pain Pain Assessment Pain Assessment: Faces Faces Pain Scale: Hurts little more Pain Location: L hip Pain Descriptors / Indicators: Discomfort Pain Intervention(s): Limited activity within patient's tolerance, Monitored during session, Repositioned    Home Living                          Prior  Function            PT Goals (current goals can now be found in the care plan section) Acute Rehab PT Goals Patient Stated Goal: to return to [facility] PT Goal Formulation: With patient/family Time For Goal Achievement: 08/19/23 Potential to Achieve Goals: Good Progress towards PT goals: Progressing toward goals    Frequency    7X/week      PT Plan      Co-evaluation              AM-PAC PT "6 Clicks" Mobility   Outcome Measure  Help needed turning from your back to your side while in a flat bed without using bedrails?: A Lot Help needed moving from lying on your back to sitting on the side of a flat bed without using bedrails?: A Lot Help needed moving to and from a bed to a chair (including a wheelchair)?: Total Help needed standing up from a chair using your arms (e.g., wheelchair or bedside chair)?: Total Help needed to walk in hospital room?: Total Help needed climbing 3-5 steps with a railing? : Total 6 Click Score: 8    End of Session Equipment Utilized During Treatment: Oxygen Activity Tolerance: Patient tolerated treatment well Patient left: in bed;with call bell/phone within reach;with bed alarm set Nurse Communication: Mobility status PT Visit Diagnosis: Other abnormalities of gait and mobility (R26.89);Difficulty in walking, not elsewhere classified (R26.2)     Time: 1431-1501 PT Time Calculation (min) (ACUTE ONLY): 30 min  Charges:    $Therapeutic Activity: 23-37 mins PT General Charges $$ ACUTE PT VISIT: 1 Visit                     Ozie Bo, PT, MPT    Vanessa [name] 08/06/2023, 3:26 PM

## 2023-08-06 NOTE — Progress Notes (Addendum)
 [facility] CLINIC CARDIOLOGY PROGRESS NOTE       Patient ID: Vanessa [name] MRN: [id number] DOB/AGE: 10-08-1934 89 y.o.  Admit date: 08/04/2023 Referring Physician Dr., Sari Cunning Primary Physician [name], MD  Primary Cardiologist Dr Beau Bound Reason for Consultation AF RVR  HPI: Vanessa [name] is a 89 y.o. female  with a past medical history of chronic combined systolic and diastolic heart failure, paroxysmal atrial fibrillation, Hx SVT, chronic hypotension on midodrine, hyperlipidemia, COPD, type 2 diabetes mellitus who presented to the ED on 08/04/2023 following an unwitnessed fall that resulted in a fracture of the left femoral neck requiring surgery. In the setting of left hip pain patient went into atrial fibrillation, this is not new for the patient. Cardiology was consulted for further evaluation.   Interval history: -Patient seen and examined, reports she is feeling well today.  -Continue to be confused but states that she is not having chest pain, SOB, or palpitations.  -Reports that her hip is uncomfortable but no significant pain.  -BP improved off pressors. HR improved as well.   Review of systems complete and found to be negative unless listed above    Past Medical History:  Diagnosis Date   Anemia    treated ~2009 with iron, resolved   Arthritis    Cataract    surgery on R catarct 2012   Chronic systolic CHF (congestive heart failure) (HCC)    a. TTE 2012: EF of 45-50%, mild diffuse HK, trivial AI, mild MR, mildly dilated RV with mild reduction of RVSF, mild to moderate TR, mild to moderate PR, mildly elevated PASP   COPD (chronic obstructive pulmonary disease) (HCC)    Family history of adverse reaction to anesthesia    Mother - altered mental status (long term)   Glaucoma    History of pelvic fracture    Hyperglycemia    mild inc in fasting sugar   Hyperlipidemia    Hypothyroidism    Insomnia    Migraines    without aura, sx since age 53   Osteopenia     prev with 10 years of fosamax and intolerant of evista   PAF (paroxysmal atrial fibrillation) (HCC)    a. initially noted 2012; b. CHADS2VASc at least 5 (CHF, age x 2, vasacular disease, female)   Pulmonary hypertension (HCC)    Wears dentures    full upper and lower    Past Surgical History:  Procedure Laterality Date   CATARACT EXTRACTION     CATARACT EXTRACTION W/PHACO Left 08/27/2016   Procedure: CATARACT EXTRACTION PHACO AND INTRAOCULAR LENS PLACEMENT (IOC)  Left;  Surgeon: [name], MD;  Location: [facility];  Service: Ophthalmology;  Laterality: Left;   COLONOSCOPY  2012   INTRAMEDULLARY (IM) NAIL INTERTROCHANTERIC Right 02/07/2023   Procedure: INTRAMEDULLARY (IM) NAIL INTERTROCHANTERIC;  Surgeon: Elner Hahn, MD;  Location: [facility] OR[facility];  Service: Orthopedics;  Laterality: Right;   INTRAMEDULLARY (IM) NAIL INTERTROCHANTERIC Left 08/04/2023   Procedure: FIXATION, FRACTURE, INTERTROCHANTERIC, WITH INTRAMEDULLARY ROD;  Surgeon: Marlynn Singer, MD;  Location: [facility] OR[facility];  Service: Orthopedics;  Laterality: Left;   REFRACTIVE SURGERY      Medications Prior to Admission  Medication Sig Dispense Refill Last Dose/Taking   acetaminophen (TYLENOL) 325 MG tablet Take 1-2 tablets (325-650 mg total) by mouth every 6 (six) hours as needed for mild pain (pain score 1-3) (pain score 1-3 or temp > 100.5).   Taking As Needed   aspirin EC 81 MG tablet Take 81 mg by  mouth daily.   08/04/2023   Cholecalciferol (VITAMIN D) 50 MCG (2000 UT) CAPS Take 1 capsule (2,000 Units total) by mouth daily.   08/04/2023   empagliflozin (JARDIANCE) 10 MG TABS tablet Take 10 mg by mouth daily.   08/04/2023   levothyroxine (SYNTHROID) 25 MCG tablet TAKE 1 TABLET BY MOUTH ONCE DAILY ON AN EMPTY STOMACH. WAIT 30 MINUTES BEFORE TAKING OTHER MEDS. 90 tablet 1 08/04/2023   melatonin 5 MG TABS Take 2.5 mg by mouth at bedtime as needed.   Past Week   memantine (NAMENDA) 5 MG tablet Take 5 mg by mouth 2 (two)  times daily.   08/04/2023   metoprolol succinate (TOPROL-XL) 25 MG 24 hr tablet Take 12.5 mg by mouth daily.   08/04/2023 Morning   midodrine (PROAMATINE) 10 MG tablet Take 10 mg by mouth 3 (three) times daily.   08/04/2023   pantoprazole (PROTONIX) 20 MG tablet Take 20 mg by mouth daily.   Taking   polyethylene glycol (MIRALAX / GLYCOLAX) 17 g packet Take 17 g by mouth daily. 14 each 0 08/03/2023   Potassium 99 MG TABS Take 2 tablets (198 mg total) by mouth daily.   08/04/2023   senna-docusate (SENOKOT-S) 8.6-50 MG tablet Take 1 tablet by mouth 2 (two) times daily as needed for mild constipation.   Taking As Needed   torsemide (DEMADEX) 10 MG tablet Take 10 mg by mouth daily.   08/04/2023   Zinc Oxide (TRIPLE PASTE) 40 % PSTE Apply 1 Application topically as needed (to buttocks and sacrum).   Past Week   feeding supplement (ENSURE ENLIVE / ENSURE PLUS) LIQD Take 237 mLs by mouth 2 (two) times daily between meals. 237 mL 12    Social History   Socioeconomic History   Marital status: Widowed    Spouse name: Not on file   Number of children: Not on file   Years of education: Not on file   Highest education level: Some college, no degree  Occupational History   Not on file  Tobacco Use   Smoking status: Former    Current packs/day: 0.25    Average packs/day: 0.3 packs/day for 50.0 years (12.5 ttl pk-yrs)    Types: Cigarettes   Smokeless tobacco: Never   Tobacco comments:    started smoking in 1958- stopped 2020 after a fall  Vaping Use   Vaping status: Never Used  Substance and Sexual Activity   Alcohol use: No   Drug use: No   Sexual activity: Never  Other Topics Concern   Not on file  Social History Narrative   From [place].     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  Was married for 47 years.     Active in church and senior groups.     Likes gospel singing.     Lives alone (with her dog Kirt Boys)   4 kids, all are nearby.     Social Drivers of Corporate investment banker  Strain: Low Risk  (08/10/2022)   Overall Financial Resource Strain (CARDIA)    Difficulty of Paying Living Expenses: Not very hard  Food Insecurity: No Food Insecurity (08/05/2023)   Hunger Vital Sign    Worried About Running Out of Food in the Last Year: Never true    Ran Out of Food in the Last Year: Never true  Transportation Needs: No Transportation Needs (08/05/2023)   PRAPARE - Administrator, Civil Service (Medical): No  Lack of Transportation (Non-Medical): No  Physical Activity: Unknown (08/10/2022)   Exercise Vital Sign    Days of Exercise per Week: 0 days    Minutes of Exercise per Session: Not on file  Recent Concern: Physical Activity - Inactive (08/10/2022)   Exercise Vital Sign    Days of Exercise per Week: 0 days    Minutes of Exercise per Session: 0 min  Stress: No Stress Concern Present (08/10/2022)   [employer] of Occupational Health - Occupational Stress Questionnaire    Feeling of Stress : Not at all  Social Connections: Socially Isolated (08/05/2023)   Social Connection and Isolation Panel [NHANES]    Frequency of Communication with Friends and Family: Once a week    Frequency of Social Gatherings with Friends and Family: Once a week    Attends Religious Services: Never    Database administrator or Organizations: No    Attends Banker Meetings: Never    Marital Status: Widowed  Intimate Partner Violence: Not At Risk (08/05/2023)   Humiliation, Afraid, Rape, and Kick questionnaire    Fear of Current or Ex-Partner: No    Emotionally Abused: No    Physically Abused: No    Sexually Abused: No    Family History  Problem Relation Age of Onset   Diabetes Father    Migraines Mother    Stroke Mother        TIAs   Breast cancer Neg Hx    Colon cancer Neg Hx      Vitals:   08/06/23 0745 08/06/23 0800 08/06/23 0815 08/06/23 0830  BP:  (!) 120/104    Pulse:   84   Resp: 20 20 19  (!) 28  Temp:      TempSrc:      SpO2:   (!) 82%    Weight:      Height:        PHYSICAL EXAM General: Well-appearing elderly female, well nourished, in no acute distress. HEENT: Normocephalic and atraumatic. Neck: No JVD.  Lungs: Normal respiratory effort on 4L. Clear bilaterally to auscultation. No wheezes, crackles, rhonchi.  Heart: Irregularly irregular, controlled heart rate. Normal S1 and S2 without gallops or murmurs.  Abdomen: Non-distended appearing.  Msk: Normal strength and tone for age. Extremities: Warm and well perfused. No clubbing, cyanosis.  No edema.  Neuro: Alert and oriented X 3. Psych: Answers questions appropriately.   Labs: Basic Metabolic Panel: Recent Labs    08/05/23 0435 08/06/23 0434  NA 139 138  K 4.3 3.9  CL 105 106  CO2 22 23  GLUCOSE 234* 86  BUN 24* 26*  CREATININE 1.28* 0.90  CALCIUM 8.1* 8.3*   Liver Function Tests: No results for input(s): "AST", "ALT", "ALKPHOS", "BILITOT", "PROT", "ALBUMIN" in the last 72 hours. No results for input(s): "LIPASE", "AMYLASE" in the last 72 hours. CBC: Recent Labs    08/05/23 0435 08/06/23 0434  WBC 16.0* 11.7*  HGB 10.6* 8.8*  HCT 34.4* 27.6*  MCV 91.0 89.3  PLT 210 177   Cardiac Enzymes: Recent Labs    08/04/23 1312 08/04/23 1705  TROPONINIHS 65* 56*   BNP: Recent Labs    08/04/23 2214  BNP 320.6*   D-Dimer: No results for input(s): "DDIMER" in the last 72 hours. Hemoglobin A1C: No results for input(s): "HGBA1C" in the last 72 hours. Fasting Lipid Panel: No results for input(s): "CHOL", "HDL", "LDLCALC", "TRIG", "CHOLHDL", "LDLDIRECT" in the last 72 hours. Thyroid Function Tests: Recent Labs  08/05/23 0435  TSH 2.176   Anemia Panel: No results for input(s): "VITAMINB12", "FOLATE", "FERRITIN", "TIBC", "IRON", "RETICCTPCT" in the last 72 hours.   Radiology: CT HEAD WO CONTRAST ( ) Result Date: 08/05/2023 CLINICAL DATA:  unwitnessed fall EXAM: CT HEAD WITHOUT CONTRAST TECHNIQUE: Contiguous axial images were obtained from  the base of the skull through the vertex without intravenous contrast. RADIATION DOSE REDUCTION: This exam was performed according to the departmental dose-optimization program which includes automated exposure control, adjustment of the mA and/or kV according to patient size and/or use of iterative reconstruction technique. COMPARISON:  CT head February 07, 2023. FINDINGS: Brain: No evidence of acute infarction, hemorrhage, hydrocephalus, extra-axial collection or mass lesion/mass effect. Patchy white matter hypodensities, nonspecific but compatible with chronic microvascular disease. Vascular: No hyperdense vessel or unexpected calcification. Skull: No acute fracture. Sinuses/Orbits: Clear sinuses.  No acute orbital findings. Other: No mastoid effusions. IMPRESSION: No evidence of acute intracranial abnormality. Electronically Signed   By: Feliberto Harts M.D.   On: 08/05/2023 19:44   US ARTERIAL ABI (SCREENING LOWER EXTREMITY) Result Date: 08/05/2023 CLINICAL DATA:  Absent pulses in the lower extremities. EXAM: NONINVASIVE PHYSIOLOGIC VASCULAR STUDY OF BILATERAL LOWER EXTREMITIES TECHNIQUE: Evaluation of both lower extremities were performed at rest, including calculation of ankle-brachial indices with single level pressure measurements and doppler recording. COMPARISON:  None available. FINDINGS: Right ABI:  0.93 Left ABI:  1.30 Right Lower Extremity: Posterior tibial and dorsalis pedis artery waveforms are monophasic. Left Lower Extremity: Posterior tibial and dorsalis pedis artery waveforms are monophasic. 0.9-0.99 Borderline PAD IMPRESSION: At least borderline lower extremity peripheral arterial disease in the right lower extremity. The degree of stenosis may be more significant given abnormal bilateral waveforms. Consider further evaluation with CT angiography lower extremities. Electronically Signed   By: Acquanetta Belling M.D.   On: 08/05/2023 11:26   DG HIP UNILAT WITH PELVIS 2-3 VIEWS LEFT Result Date:  08/04/2023 CLINICAL DATA:  Left intramedullary nail EXAM: DG HIP (WITH OR WITHOUT PELVIS) 2-3V LEFT COMPARISON:  Left hip x-ray 08/04/2023. FINDINGS: Three intraoperative fluoroscopic views of the left hip. Left femoral intramedullary nail and hip screw replaced. Alignment is anatomic as can be seen. IMPRESSION: Left femoral intramedullary nail and hip screw replaced. Electronically Signed   By: Darliss Cheney M.D.   On: 08/04/2023 20:27   DG C-Arm 1-60 Min-No Report Result Date: 08/04/2023 Fluoroscopy was utilized by the requesting physician.  No radiographic interpretation.   DG C-Arm 1-60 Min-No Report Result Date: 08/04/2023 Fluoroscopy was utilized by the requesting physician.  No radiographic interpretation.   DG Chest Portable 1 View Result Date: 08/04/2023 CLINICAL DATA:  Pain, LEFT hip fall. EXAM: PORTABLE CHEST 1 VIEW COMPARISON:  Radiograph 02/09/2023 FINDINGS: Large hiatal hernia posterior to the heart. Entire stomach is above the hemidiaphragm. No pulmonary edema or pleural fluid. No pneumothorax. No acute osseous abnormality. IMPRESSION: 1. No acute cardiopulmonary process. 2. Large hiatal hernia. Electronically Signed   By: Genevive Bi M.D.   On: 08/04/2023 13:11   DG Hip Unilat With Pelvis 2-3 Views Left Result Date: 08/04/2023 CLINICAL DATA:  Fall, LEFT hip pain EXAM: DG HIP (WITH OR WITHOUT PELVIS) 2-3V LEFT COMPARISON:  None Available. FINDINGS: Intertrochanteric fracture of the LEFT femoral neck. Mild caudad migration of the femur. No angulation. Avulsion of the lesser trochanter. Lucency in the LEFT inferior pubic ramus. Potential aggressive osseous lesion. Internal fixation of the RIGHT hip. IMPRESSION: Intertrochanteric fracture of the LEFT femoral neck. Lucency in the LEFT inferior pubic  ramus. Potential aggressive osseous lesion. Electronically Signed   By: Genevive Bi M.D.   On: 08/04/2023 13:09    ECHO 02/08/2023  1. Left ventricular ejection fraction, by  estimation, is 45 to 50%. Left ventricular ejection fraction by PLAX is 49 %. The left ventricle has mildly decreased function. The left ventricle demonstrates global hypokinesis. Left ventricular diastolic parameters are indeterminate.   2. Right ventricular systolic function is normal. The right ventricular size is normal. There is normal pulmonary artery systolic pressure. The estimated right ventricular systolic pressure is 34.8 mmHg.   3. The mitral valve is grossly normal. Trivial mitral valve regurgitation.   4. The aortic valve is grossly normal. Aortic valve regurgitation is not visualized. Aortic valve sclerosis/calcification is present, without any evidence of aortic stenosis.   TELEMETRY reviewed by me 08/06/2023: Atrial fibrillation, rate 80s  EKG reviewed by me: Pre-op: Sinus Rhythm, rate 60 bpm       Postop: Atrial fibrillation, rate 125 bpm  Data reviewed by me 08/06/2023: last 24h vitals tele labs imaging I/O hospitalist progress notes, ortho notes  Principal Problem:   Femur fracture (HCC) Active Problems:   Hypothyroid   Diabetes mellitus with hyperglycemia (HCC)   Hypotension   Chronic obstructive pulmonary disease (HCC)   Dementia (HCC)   AKI (acute kidney injury) (HCC)   Protein-calorie malnutrition, unspecified severity (HCC)   Diastolic CHF with preserved left ventricular function, NYHA class 2 (HCC)   Paroxysmal atrial fibrillation (HCC)   Elevated troponin    ASSESSMENT AND PLAN:  Vanessa [name] is a 89 y.o. female  with a past medical history of chronic combined systolic and diastolic heart failure, paroxysmal atrial fibrillation, Hx SVT, chronic hypotension on midodrine, hyperlipidemia, COPD, type 2 diabetes mellitus who presented to the ED on 08/04/2023 following an unwitnessed fall that resulted in a fracture of the left femoral neck requiring surgery. In the setting of left hip pain patient went into atrial fibrillation, this is not new for the patient.  Cardiology was consulted for further evaluation.  # Left femur fracture s/p ORIF (04/13) # Frequent falls # Paroxysmal atrial fibrillation # Chronic hypotension (Midodrine at home) # Chronic combined systolic and diastolic heart failure (EF 45-50%) Patient reports to the ED after an unwitnessed fall brought in by EMS.  Patient reports she does not remember falling or having a procedure. BNP mildy elevated 320.  Troponins mildly elevated and flat 65 > 56. Initially in NSR on EKG but in the setting of left hip pain patient went into atrial fibrillation with RVR rates 120s to 130s.  Patient was given 1 dose of IV metoprolol 2.5 mg for rate control.  S/p left femoral fixation patient developed hypotension and was placed on phenylephrine gtt.  At 0640 yesterday phenylephrine was stopped and patient resumed on home midodrine.  HR improved today. -S/p IV digoxin with dig level of 1.6 this AM. Starting PO digoxin 0.125 mg daily as renal function today is normal, GFR >60. Resuming home metoprolol 12.5 mg daily. -Defer use of anticoagulation due to high risk of bleeding/frequent falls, downtrending Hgb. -Continue home midodrine.  -Resume GDMT as BP allows.   This patient's plan of care was discussed and created with Dr. Melton Alar and she is in agreement.  Signed: Gale Journey, PA-C  08/06/2023, 9:35 AM [facility] Cardiology

## 2023-08-06 NOTE — Progress Notes (Signed)
 PROGRESS NOTE    Vanessa [name]  NFA:[id number] DOB: October 07, 1934 DOA: 08/04/2023 PCP: [name], MD  Outpatient Specialists: cardiology, pulmonology    Brief Narrative:   From admission h and p  Vanessa [name] is a 89 y.o. female with medical history significant of Chronic systolic and diastolic CHF, COPD, SVT, type 2 diabetes, hypothyroidism, atrial fibrillation not on AC, chronic hypotension on midodrine, who presents to the ED due to unwitnessed fall.   Vanessa [name] states she cannot recall exactly what happened with the fall.  Per chart review, she was sitting in a recliner and tried to get up without help and fell forward landing on her left side.  Due to this, EMS was called.  At this time, Vanessa [name] endorses left hip pain but otherwise denies chest pain, shortness of breath, dizziness, headache, nausea, vomiting.  Assessment & Plan:   Principal Problem:   Femur fracture (HCC) Active Problems:   Paroxysmal atrial fibrillation (HCC)   AKI (acute kidney injury) (HCC)   Elevated troponin   Diastolic CHF with preserved left ventricular function, NYHA class 2 (HCC)   Hypothyroid   Diabetes mellitus with hyperglycemia (HCC)   Hypotension   Chronic obstructive pulmonary disease (HCC)   Dementia (HCC)   Protein-calorie malnutrition, unspecified severity (HCC)  # Left intertrochanteric femur fracture # Fall 2/2 fall, pod #2. Broke right hip October of last year. Ct head nothing acute - hgb 8s, no signs bleeding, monitor - lovenox - PT/OT - pain control - bowel regimen (BM today) - CT head as fall was unwitnessed and patient is baseline demented  # PAD ABIs show mild disease, vascular (Brien Pace) has reviewed, nothing acute, can f/u outpt  # a-fib with RVR Required pressors overnight after surgery now weaned off and hemodynamically stable - continue fluids for one more day - metop resumed today per cardiology - on digoxin per cardiology - anticoagulation  deferred by cardiology given bleed risk  # AKI # Elevated lactate Improving - continue fluids one more day at half rate of 50 today - repeat lactate tomorrow  # Dementia Without behavioral disturbance. Resides at [facility] - home memantine  # Chronic hypotension On midodrine at home - continue midodrine  # ILD 2/2 recurrent aspiration pneumonia - dysphagia 3 diet, slp swallow eval - monitor  # T2DM With hyperglycemia, A1c 8s earlier this year - SSI  # Hypothyroid TSH wnl - home levothyroxine  # HFpEF Does not appear exacerbated - home prn torsemide and jardiance on hold  DVT prophylaxis: lovenox Code Status: dnr Family Communication: daughter updated @ bedside 4/15  Level of care: Stepdown Status is: Inpatient Remains inpatient appropriate because: continuing IV fluids one more day, likely stable for d/c tomorrow pending insurance auth for snf    Consultants:  Cardiology, orthopedics  Procedures: Left intertrochanteric fracture fixation with IM rod on 4/13 w/ Dr. Annabell Key  Antimicrobials:  Cefazolin surgical ppx    Subjective: No pain, eating lunch, bm earlier today  Objective: Vitals:   08/06/23 1000 08/06/23 1100 08/06/23 1130 08/06/23 1200  BP: (!) 98/56 127/68  115/69  Pulse:  89  90  Resp: 20 (!) 23  (!) 32  Temp:   (!) 97.2 F (36.2 C) (!) 97.3 F (36.3 C)  TempSrc:   Oral Oral  SpO2:  100%  97%  Weight:      Height:        Intake/Output Summary (Last 24 hours) at 08/06/2023 1232 Last data  filed at 08/06/2023 1101 Gross per 24 hour  Intake 2416.43 ml  Output 1500 ml  Net 916.43 ml   Filed Weights   08/04/23 1207 08/04/23 2204  Weight: 56.3 kg 55.7 kg    Examination:  General exam: Appears calm and comfortable  Respiratory system: Clear to auscultation. Respiratory effort normal. Cardiovascular system: S1 & S2 heard, tachycardic, irreg Gastrointestinal system: Abdomen is nondistended, soft and nontender.   Central nervous  system: moving all 4 Extremities: warm Skin: bandage over left lateral hip Psychiatry: Judgement and insight appear normal. Mood & affect appropriate.     Data Reviewed: I have personally reviewed following labs and imaging studies  CBC: Recent Labs  Lab 08/04/23 1312 08/04/23 2214 08/05/23 0435 08/06/23 0434  WBC 8.8 19.0* 16.0* 11.7*  HGB 14.2 11.8* 10.6* 8.8*  HCT 44.7 37.5 34.4* 27.6*  MCV 90.3 92.1 91.0 89.3  PLT 276 261 210 177   Basic Metabolic Panel: Recent Labs  Lab 08/04/23 1312 08/04/23 2214 08/05/23 0435 08/06/23 0434  NA 138 137 139 138  K 4.0 4.5 4.3 3.9  CL 98 99 105 106  CO2 28 24 22 23   GLUCOSE 230* 289* 234* 86  BUN 21 24* 24* 26*  CREATININE 1.23* 1.47* 1.28* 0.90  CALCIUM 9.2 8.5* 8.1* 8.3*   GFR: Estimated Creatinine Clearance: 38.7 mL/min (by C-G formula based on SCr of 0.9 mg/dL). Liver Function Tests: No results for input(s): "AST", "ALT", "ALKPHOS", "BILITOT", "PROT", "ALBUMIN" in the last 168 hours. No results for input(s): "LIPASE", "AMYLASE" in the last 168 hours. No results for input(s): "AMMONIA" in the last 168 hours. Coagulation Profile: Recent Labs  Lab 08/04/23 1705  INR 1.1   Cardiac Enzymes: No results for input(s): "CKTOTAL", "CKMB", "CKMBINDEX", "TROPONINI" in the last 168 hours. BNP (last 3 results) No results for input(s): "PROBNP" in the last 8760 hours. HbA1C: No results for input(s): "HGBA1C" in the last 72 hours. CBG: Recent Labs  Lab 08/05/23 2325 08/05/23 2327 08/06/23 0329 08/06/23 0743 08/06/23 1137  GLUCAP 66* 71 72 101* 211*   Lipid Profile: No results for input(s): "CHOL", "HDL", "LDLCALC", "TRIG", "CHOLHDL", "LDLDIRECT" in the last 72 hours. Thyroid Function Tests: Recent Labs    08/05/23 0435  TSH 2.176  FREET4 0.95   Anemia Panel: No results for input(s): "VITAMINB12", "FOLATE", "FERRITIN", "TIBC", "IRON", "RETICCTPCT" in the last 72 hours. Urine analysis:    Component Value Date/Time    COLORURINE YELLOW (A) 08/04/2023 2344   APPEARANCEUR HAZY (A) 08/04/2023 2344   LABSPEC 1.017 08/04/2023 2344   PHURINE 5.0 08/04/2023 2344   GLUCOSEU >=500 (A) 08/04/2023 2344   GLUCOSEU NEGATIVE 11/26/2022 1327   HGBUR NEGATIVE 08/04/2023 2344   BILIRUBINUR NEGATIVE 08/04/2023 2344   BILIRUBINUR negative 11/07/2022 1339   BILIRUBINUR neg 02/27/2022 1508   KETONESUR NEGATIVE 08/04/2023 2344   PROTEINUR NEGATIVE 08/04/2023 2344   UROBILINOGEN 0.2 11/26/2022 1327   NITRITE NEGATIVE 08/04/2023 2344   LEUKOCYTESUR NEGATIVE 08/04/2023 2344   Sepsis Labs: @LABRCNTIP (procalcitonin:4,lacticidven:4)  ) Recent Results (from the past 240 hours)  MRSA Next Gen by PCR, Nasal     Status: None   Collection Time: 08/04/23 10:13 PM   Specimen: Nasal Mucosa; Nasal Swab  Result Value Ref Range Status   MRSA by PCR Next Gen NOT DETECTED NOT DETECTED Final    Comment: (NOTE) The GeneXpert MRSA Assay (FDA approved for NASAL specimens only), is one component of a comprehensive MRSA colonization surveillance program. It is not intended to diagnose  MRSA infection nor to guide or monitor treatment for MRSA infections. Test performance is not FDA approved in patients less than 32 years old. Performed at [facility], [address] [place], [place] [address]   Culture, blood (Routine X 2) w Reflex to ID Panel     Status: None (Preliminary result)   Collection Time: 08/05/23  3:02 AM   Specimen: BLOOD  Result Value Ref Range Status   Specimen Description BLOOD BLOOD LEFT HAND low volume hard stick  Final   Special Requests   Final    BOTTLES DRAWN AEROBIC ONLY Blood Culture results may not be optimal due to an inadequate volume of blood received in culture bottles   Culture   Final    NO GROWTH 1 DAY Performed at [facility], [address], [place], [place] [address]    Report Status PENDING  Incomplete  Culture, blood (Routine X 2) w Reflex to ID Panel     Status: None  (Preliminary result)   Collection Time: 08/05/23  4:35 AM   Specimen: BLOOD LEFT HAND  Result Value Ref Range Status   Specimen Description BLOOD LEFT HAND  Final   Special Requests   Final    BOTTLES DRAWN AEROBIC ONLY Blood Culture results may not be optimal due to an inadequate volume of blood received in culture bottles   Culture   Final    NO GROWTH 1 DAY Performed at [facility], [address], [place], [place] [address]    Report Status PENDING  Incomplete         Radiology Studies: CT HEAD WO CONTRAST ( ) Result Date: 08/05/2023 CLINICAL DATA:  unwitnessed fall EXAM: CT HEAD WITHOUT CONTRAST TECHNIQUE: Contiguous axial images were obtained from the base of the skull through the vertex without intravenous contrast. RADIATION DOSE REDUCTION: This exam was performed according to the departmental dose-optimization program which includes automated exposure control, adjustment of the mA and/or kV according to patient size and/or use of iterative reconstruction technique. COMPARISON:  CT head February 07, 2023. FINDINGS: Brain: No evidence of acute infarction, hemorrhage, hydrocephalus, extra-axial collection or mass lesion/mass effect. Patchy white matter hypodensities, nonspecific but compatible with chronic microvascular disease. Vascular: No hyperdense vessel or unexpected calcification. Skull: No acute fracture. Sinuses/Orbits: Clear sinuses.  No acute orbital findings. Other: No mastoid effusions. IMPRESSION: No evidence of acute intracranial abnormality. Electronically Signed   By: Feliberto Harts M.D.   On: 08/05/2023 19:44   US ARTERIAL ABI (SCREENING LOWER EXTREMITY) Result Date: 08/05/2023 CLINICAL DATA:  Absent pulses in the lower extremities. EXAM: NONINVASIVE PHYSIOLOGIC VASCULAR STUDY OF BILATERAL LOWER EXTREMITIES TECHNIQUE: Evaluation of both lower extremities were performed at rest, including calculation of ankle-brachial indices with single level pressure  measurements and doppler recording. COMPARISON:  None available. FINDINGS: Right ABI:  0.93 Left ABI:  1.30 Right Lower Extremity: Posterior tibial and dorsalis pedis artery waveforms are monophasic. Left Lower Extremity: Posterior tibial and dorsalis pedis artery waveforms are monophasic. 0.9-0.99 Borderline PAD IMPRESSION: At least borderline lower extremity peripheral arterial disease in the right lower extremity. The degree of stenosis may be more significant given abnormal bilateral waveforms. Consider further evaluation with CT angiography lower extremities. Electronically Signed   By: Acquanetta Belling M.D.   On: 08/05/2023 11:26   DG HIP UNILAT WITH PELVIS 2-3 VIEWS LEFT Result Date: 08/04/2023 CLINICAL DATA:  Left intramedullary nail EXAM: DG HIP (WITH OR WITHOUT PELVIS) 2-3V LEFT COMPARISON:  Left hip x-ray 08/04/2023. FINDINGS:  Three intraoperative fluoroscopic views of the left hip. Left femoral intramedullary nail and hip screw replaced. Alignment is anatomic as can be seen. IMPRESSION: Left femoral intramedullary nail and hip screw replaced. Electronically Signed   By: Tyron Gallon M.D.   On: 08/04/2023 20:27   DG C-Arm 1-60 Min-No Report Result Date: 08/04/2023 Fluoroscopy was utilized by the requesting physician.  No radiographic interpretation.   DG C-Arm 1-60 Min-No Report Result Date: 08/04/2023 Fluoroscopy was utilized by the requesting physician.  No radiographic interpretation.   DG Chest Portable 1 View Result Date: 08/04/2023 CLINICAL DATA:  Pain, LEFT hip fall. EXAM: PORTABLE CHEST 1 VIEW COMPARISON:  Radiograph 02/09/2023 FINDINGS: Large hiatal hernia posterior to the heart. Entire stomach is above the hemidiaphragm. No pulmonary edema or pleural fluid. No pneumothorax. No acute osseous abnormality. IMPRESSION: 1. No acute cardiopulmonary process. 2. Large hiatal hernia. Electronically Signed   By: Deboraha Fallow M.D.   On: 08/04/2023 13:11   DG Hip Unilat With Pelvis 2-3 Views  Left Result Date: 08/04/2023 CLINICAL DATA:  Fall, LEFT hip pain EXAM: DG HIP (WITH OR WITHOUT PELVIS) 2-3V LEFT COMPARISON:  None Available. FINDINGS: Intertrochanteric fracture of the LEFT femoral neck. Mild caudad migration of the femur. No angulation. Avulsion of the lesser trochanter. Lucency in the LEFT inferior pubic ramus. Potential aggressive osseous lesion. Internal fixation of the RIGHT hip. IMPRESSION: Intertrochanteric fracture of the LEFT femoral neck. Lucency in the LEFT inferior pubic ramus. Potential aggressive osseous lesion. Electronically Signed   By: Deboraha Fallow M.D.   On: 08/04/2023 13:09        Scheduled Meds:  Chlorhexidine Gluconate Cloth  6 each Topical Daily   cholecalciferol  2,000 Units Oral Daily   digoxin  0.125 mg Oral Daily   feeding supplement  237 mL Oral TID BM   ferrous sulfate  325 mg Oral Q breakfast   gabapentin  200 mg Oral BID   insulin aspart  0-5 Units Subcutaneous QHS   insulin aspart  0-9 Units Subcutaneous TID WC   levothyroxine  25 mcg Oral Q0600   memantine  5 mg Oral BID   metoprolol succinate  12.5 mg Oral Daily   midodrine  10 mg Oral TID AC   [START ON 08/07/2023] multivitamin with minerals  1 tablet Oral Daily   senna  1 tablet Oral BID   Continuous Infusions:  sodium chloride 50 mL/hr at 08/06/23 1151     LOS: 2 days     Raymonde Calico, MD [facility]   If 7PM-7AM, please contact night-coverage [email or web address] Password [facility] 08/06/2023, 12:32 PM

## 2023-08-06 NOTE — Progress Notes (Signed)
 Patient transferred from the ICU by Billie Budge, RN. Patient alert and oriented to self, otherwise confused. Patient has surgical incision to L hip. Clean, dry, intact. Patient denies pain and in no apparent distress. Bed alarm activated. Call bell within reach.

## 2023-08-06 NOTE — Progress Notes (Signed)
 Subjective: 2 Days Post-Op Procedure(s) (LRB): FIXATION, FRACTURE, INTERTROCHANTERIC, WITH INTRAMEDULLARY ROD (Left) Patient remains in ICU but is stable and alert.  Pain is minimal.  Dressing is dry.  She is worked with PT slightly.  Hemoglobin is 8.8.  Patient reports pain as mild.  Objective:   VITALS:   Vitals:   08/06/23 1130 08/06/23 1200  BP:  115/69  Pulse:  90  Resp:  (!) 32  Temp: (!) 97.2 F (36.2 C) (!) 97.3 F (36.3 C)  SpO2:  97%    Neurologically intact Sensation intact distally Dorsiflexion/Plantar flexion intact Incision: dressing C/D/I  LABS Recent Labs    08/04/23 2214 08/05/23 0435 08/06/23 0434  HGB 11.8* 10.6* 8.8*  HCT 37.5 34.4* 27.6*  WBC 19.0* 16.0* 11.7*  PLT 261 210 177    Recent Labs    08/04/23 2214 08/05/23 0435 08/06/23 0434  NA 137 139 138  K 4.5 4.3 3.9  BUN 24* 24* 26*  CREATININE 1.47* 1.28* 0.90  GLUCOSE 289* 234* 86    Recent Labs    08/04/23 1705  INR 1.1     Assessment/Plan: 2 Days Post-Op Procedure(s) (LRB): FIXATION, FRACTURE, INTERTROCHANTERIC, WITH INTRAMEDULLARY ROD (Left)   Advance diet Up with therapy Discharge to SNF Holding DVT prophylaxis at the request of cardiology due to high risk of bleeding and downtrending hemoglobin

## 2023-08-06 NOTE — TOC Progression Note (Signed)
 Transition of Care [facility]) - Progression Note    Patient Details  Name: Vanessa [name] MRN: [id number] Date of Birth: 01-24-1935  Transition of Care [facility]) CM/SW Contact  Shepherd Finnan A Galen Russman, RN Phone Number: 08/06/2023, 3:12 PM  Clinical Narrative:    Chart reviewed.  Informed by Dr. Sari Cunning that patient will be stable for discharge on tomorrow.    I have spoken with patient's daughter [name] and she informs me that she would like for her mother to receive short term rehab at [facility] on discharge.  I have made [name] aware that [facility] has requested SNF authorization for short-term rehab.    I have asked Tammy with [employer] to submit for SNF authorization and I have also requested ambulance transport authorization.    TOC will continue to follow for discharge planning.    Expected Discharge Plan: Skilled Nursing Facility Barriers to Discharge: No Barriers Identified  Expected Discharge Plan and Services       Living arrangements for the past 2 months: Skilled Nursing Facility (Patient was a long term care resident of [facility].)                                       Social Determinants of Health (SDOH) Interventions SDOH Screenings   Food Insecurity: No Food Insecurity (08/05/2023)  Housing: Unknown (08/05/2023)  Transportation Needs: No Transportation Needs (08/05/2023)  Utilities: Not At Risk (08/05/2023)  Alcohol Screen: Low Risk  (06/13/2022)  Depression (PHQ2-9): Low Risk  (07/16/2023)  Financial Resource Strain: Low Risk  (08/10/2022)  Physical Activity: Unknown (08/10/2022)  Recent Concern: Physical Activity - Inactive (08/10/2022)  Social Connections: Socially Isolated (08/05/2023)  Stress: No Stress Concern Present (08/10/2022)  Tobacco Use: Medium Risk (08/04/2023)    Readmission Risk Interventions     No data to display

## 2023-08-06 NOTE — Plan of Care (Signed)
  Problem: Education: Goal: Ability to describe self-care measures that may prevent or decrease complications (Diabetes Survival Skills Education) will improve Outcome: Progressing Goal: Individualized Educational Video(s) Outcome: Progressing   Problem: Coping: Goal: Ability to adjust to condition or change in health will improve Outcome: Progressing   Problem: Fluid Volume: Goal: Ability to maintain a balanced intake and output will improve Outcome: Progressing   Problem: Health Behavior/Discharge Planning: Goal: Ability to identify and utilize available resources and services will improve Outcome: Progressing Goal: Ability to manage health-related needs will improve Outcome: Progressing   Problem: Metabolic: Goal: Ability to maintain appropriate glucose levels will improve Outcome: Progressing   Problem: Nutritional: Goal: Maintenance of adequate nutrition will improve Outcome: Progressing Goal: Progress toward achieving an optimal weight will improve Outcome: Progressing   Problem: Skin Integrity: Goal: Risk for impaired skin integrity will decrease Outcome: Progressing   Problem: Tissue Perfusion: Goal: Adequacy of tissue perfusion will improve Outcome: Progressing   Problem: Education: Goal: Knowledge of General Education information will improve Description: Including pain rating scale, medication(s)/side effects and non-pharmacologic comfort measures Outcome: Progressing   Problem: Health Behavior/Discharge Planning: Goal: Ability to manage health-related needs will improve Outcome: Progressing   Problem: Clinical Measurements: Goal: Ability to maintain clinical measurements within normal limits will improve Outcome: Progressing Goal: Will remain free from infection Outcome: Progressing Goal: Diagnostic test results will improve Outcome: Progressing Goal: Respiratory complications will improve Outcome: Progressing Goal: Cardiovascular complication will  be avoided Outcome: Progressing   Problem: Activity: Goal: Risk for activity intolerance will decrease Outcome: Progressing   Problem: Nutrition: Goal: Adequate nutrition will be maintained Outcome: Progressing   Problem: Coping: Goal: Level of anxiety will decrease Outcome: Progressing   Problem: Elimination: Goal: Will not experience complications related to bowel motility Outcome: Progressing Goal: Will not experience complications related to urinary retention Outcome: Progressing   Problem: Pain Managment: Goal: General experience of comfort will improve and/or be controlled Outcome: Progressing   Problem: Safety: Goal: Ability to remain free from injury will improve Outcome: Progressing   Problem: Education: Goal: Verbalization of understanding the information provided (i.e., activity precautions, restrictions, etc) will improve Outcome: Progressing Goal: Individualized Educational Video(s) Outcome: Progressing   Problem: Activity: Goal: Ability to ambulate and perform ADLs will improve Outcome: Progressing   Problem: Clinical Measurements: Goal: Postoperative complications will be avoided or minimized Outcome: Progressing   Problem: Self-Concept: Goal: Ability to maintain and perform role responsibilities to the fullest extent possible will improve Outcome: Progressing   Problem: Pain Management: Goal: Pain level will decrease Outcome: Progressing

## 2023-08-07 ENCOUNTER — Encounter: Payer: Self-pay | Admitting: Student

## 2023-08-07 ENCOUNTER — Non-Acute Institutional Stay (SKILLED_NURSING_FACILITY): Payer: Self-pay | Admitting: Student

## 2023-08-07 DIAGNOSIS — E1165 Type 2 diabetes mellitus with hyperglycemia: Secondary | ICD-10-CM | POA: Diagnosis not present

## 2023-08-07 DIAGNOSIS — I5030 Unspecified diastolic (congestive) heart failure: Secondary | ICD-10-CM | POA: Diagnosis not present

## 2023-08-07 DIAGNOSIS — S72142D Displaced intertrochanteric fracture of left femur, subsequent encounter for closed fracture with routine healing: Secondary | ICD-10-CM | POA: Diagnosis not present

## 2023-08-07 DIAGNOSIS — F0390 Unspecified dementia without behavioral disturbance: Secondary | ICD-10-CM

## 2023-08-07 DIAGNOSIS — E46 Unspecified protein-calorie malnutrition: Secondary | ICD-10-CM

## 2023-08-07 DIAGNOSIS — E039 Hypothyroidism, unspecified: Secondary | ICD-10-CM

## 2023-08-07 DIAGNOSIS — S72145A Nondisplaced intertrochanteric fracture of left femur, initial encounter for closed fracture: Secondary | ICD-10-CM | POA: Diagnosis not present

## 2023-08-07 LAB — CBC
HCT: 26.1 % — ABNORMAL LOW (ref 36.0–46.0)
Hemoglobin: 8.3 g/dL — ABNORMAL LOW (ref 12.0–15.0)
MCH: 28.5 pg (ref 26.0–34.0)
MCHC: 31.8 g/dL (ref 30.0–36.0)
MCV: 89.7 fL (ref 80.0–100.0)
Platelets: 200 10*3/uL (ref 150–400)
RBC: 2.91 MIL/uL — ABNORMAL LOW (ref 3.87–5.11)
RDW: 14.3 % (ref 11.5–15.5)
WBC: 10 10*3/uL (ref 4.0–10.5)
nRBC: 0 % (ref 0.0–0.2)

## 2023-08-07 LAB — BASIC METABOLIC PANEL WITH GFR
Anion gap: 5 (ref 5–15)
BUN: 22 mg/dL (ref 8–23)
CO2: 24 mmol/L (ref 22–32)
Calcium: 8.6 mg/dL — ABNORMAL LOW (ref 8.9–10.3)
Chloride: 106 mmol/L (ref 98–111)
Creatinine, Ser: 0.83 mg/dL (ref 0.44–1.00)
GFR, Estimated: >60 mL/min (ref >60)
Glucose, Bld: 222 mg/dL — ABNORMAL HIGH (ref 70–99)
Potassium: 4.1 mmol/L (ref 3.5–5.1)
Sodium: 135 mmol/L (ref 135–145)

## 2023-08-07 LAB — LACTIC ACID, PLASMA: Lactic Acid, Venous: 1.3 mmol/L (ref 0.5–1.9)

## 2023-08-07 LAB — GLUCOSE, CAPILLARY
Glucose-Capillary: 129 mg/dL — ABNORMAL HIGH (ref 70–99)
Glucose-Capillary: 169 mg/dL — ABNORMAL HIGH (ref 70–99)

## 2023-08-07 MED ORDER — ASPIRIN 81 MG PO TBEC
81.0000 mg | DELAYED_RELEASE_TABLET | Freq: Every day | ORAL | Status: DC
  Administered 2023-08-07: 81 mg via ORAL
  Filled 2023-08-07: qty 1

## 2023-08-07 MED ORDER — HYDROCODONE-ACETAMINOPHEN 5-325 MG PO TABS
1.0000 | ORAL_TABLET | Freq: Four times a day (QID) | ORAL | 0 refills | Status: AC | PRN
Start: 2023-08-07 — End: 2023-08-08

## 2023-08-07 MED ORDER — EMPAGLIFLOZIN 10 MG PO TABS
10.0000 mg | ORAL_TABLET | Freq: Every day | ORAL | Status: DC
  Administered 2023-08-07: 10 mg via ORAL
  Filled 2023-08-07: qty 1

## 2023-08-07 MED ORDER — DIGOXIN 125 MCG PO TABS: 0.1250 mg | ORAL_TABLET | Freq: Every day | ORAL | 0 refills | Status: DC

## 2023-08-07 NOTE — Plan of Care (Signed)
   Problem: Metabolic: Goal: Ability to maintain appropriate glucose levels will improve Outcome: Progressing

## 2023-08-07 NOTE — TOC Progression Note (Signed)
 Transition of Care [facility]) - Progression Note    Patient Details  Name: Vanessa [name] MRN: [id number] Date of Birth: 11-Aug-1934  [facility] CM/SW Contact  Baird Bombard, RN Phone Number: 08/07/2023, 10:01 AM  Clinical Narrative:    Attempt to reach Cain Castillo, Scientist, research (physical sciences) for [facility]. No answer. Message left advising [employer] was approved. Auth approval number provided.    Expected Discharge Plan: Skilled Nursing Facility Barriers to Discharge: No Barriers Identified  Expected Discharge Plan and Services       Living arrangements for the past 2 months: Skilled Nursing Facility (Patient was a long term care resident of [facility].)                                       Social Determinants of Health (SDOH) Interventions SDOH Screenings   Food Insecurity: No Food Insecurity (08/05/2023)  Housing: Unknown (08/05/2023)  Transportation Needs: No Transportation Needs (08/05/2023)  Utilities: Not At Risk (08/05/2023)  Alcohol Screen: Low Risk  (06/13/2022)  Depression (PHQ2-9): Low Risk  (07/16/2023)  Financial Resource Strain: Low Risk  (08/10/2022)  Physical Activity: Unknown (08/10/2022)  Recent Concern: Physical Activity - Inactive (08/10/2022)  Social Connections: Socially Isolated (08/05/2023)  Stress: No Stress Concern Present (08/10/2022)  Tobacco Use: Medium Risk (08/04/2023)    Readmission Risk Interventions     No data to display

## 2023-08-07 NOTE — Progress Notes (Signed)
 Provider:  Threasa Flood, M.D. Location:  [facility] Room Number: 308 [id number] Place of Service:  SNF (31)  PCP: [name], MD Patient Care Team: [name], MD as PCP - General (Family Medicine) [name], MD as Referring Physician (Ophthalmology) [name], MD as Consulting Physician (Cardiology)  Extended Emergency Contact Information Primary Emergency Contact: [name],[name]  [place] Home Phone: [phone] Relation: Daughter Secondary Emergency Contact: Zaldivar,[name] Mobile Phone: [phone] Relation: Son  Code Status: DNR Goals of Care: Advanced Directive information    08/05/2023   12:45 AM  Advanced Directives  Does Patient Have a Medical Advance Directive? Yes  Type of Advance Directive Out of facility DNR (pink MOST or yellow form)      Chief Complaint  Patient presents with   New Admit To SNF    New admission     HPI: Patient is a 89 y.o. female seen today for admission to [facility].  History of Present Illness The patient, with atrial fibrillation and heart failure, presents following a recent fall and fracture.  She has a history of atrial fibrillation with rapid ventricular rate and diastolic heart failure, class II. She was previously hospitalized for heart failure exacerbation and was on pressers in the ICU. Her current medications include digoxin  for atrial fibrillation, but she is not on anticoagulation due to fall and bleeding risk. She was previously on midodrine  for chronic hypertension, which is supposed to be taken three times a day.  She experienced a fall that led to her current admission, resulting in a fracture. She underwent a procedure for the fall and had ABIs that showed nothing acute. The fall has impacted her mobility and required assistance during recovery.  She has a history of dementia and is disoriented at her baseline. She is on memantine  for dementia management.  Her cognitive state affects her ability to communicate and manage her daily activities independently.  During the review of symptoms, she was noted to be wheezing, although she initially denied it was her. She also has a bandage on her left eye with some bruising and soft tissue swelling. Her skin is otherwise intact.  Spoke with patient's daughter, Ima Maltos. The hospital is traumatic for her. Going back and forth for things like She would prefer if we do everything we can here in the facility especially if there is nothing that will be fixed immediately like a fracture.   She notes that she has more delirium like she did las ttime. She hasn't slept in 36 hours.   Past Medical History:  Diagnosis Date   Anemia    treated ~2009 with iron, resolved   Arthritis    Cataract    surgery on R catarct 2012   Chronic systolic CHF (congestive heart failure) (HCC)    a. TTE 2012: EF of 45-50%, mild diffuse HK, trivial AI, mild MR, mildly dilated RV with mild reduction of RVSF, mild to moderate TR, mild to moderate PR, mildly elevated PASP   COPD (chronic obstructive pulmonary disease) (HCC)    Family history of adverse reaction to anesthesia    Mother - altered mental status (long term)   Glaucoma    History of pelvic fracture    Hyperglycemia    mild inc in fasting sugar   Hyperlipidemia    Hypothyroidism    Insomnia    Migraines    without aura, sx since age 47   Osteopenia    prev with 10 years  of fosamax and intolerant of evista   PAF (paroxysmal atrial fibrillation) (HCC)    a. initially noted 2012; b. CHADS2VASc at least 5 (CHF, age x 2, vasacular disease, female)   Pulmonary hypertension (HCC)    Wears dentures    full upper and lower   Past Surgical History:  Procedure Laterality Date   CATARACT EXTRACTION     CATARACT EXTRACTION W/PHACO Left 08/27/2016   Procedure: CATARACT EXTRACTION PHACO AND INTRAOCULAR LENS PLACEMENT (IOC)  Left;  Surgeon: [name], MD;  Location:  [facility];  Service: Ophthalmology;  Laterality: Left;   COLONOSCOPY  2012   INTRAMEDULLARY (IM) NAIL INTERTROCHANTERIC Right 02/07/2023   Procedure: INTRAMEDULLARY (IM) NAIL INTERTROCHANTERIC;  Surgeon: Elner Hahn, MD;  Location: [facility];  Service: Orthopedics;  Laterality: Right;   INTRAMEDULLARY (IM) NAIL INTERTROCHANTERIC Left 08/04/2023   Procedure: FIXATION, FRACTURE, INTERTROCHANTERIC, WITH INTRAMEDULLARY ROD;  Surgeon: Marlynn Singer, MD;  Location: [facility] OR[facility];  Service: Orthopedics;  Laterality: Left;   REFRACTIVE SURGERY      reports that she has quit smoking. Her smoking use included cigarettes. She has a 12.5 pack-year smoking history. She has never used smokeless tobacco. She reports that she does not drink alcohol and does not use drugs. Social History   Socioeconomic History   Marital status: Widowed    Spouse name: Not on file   Number of children: Not on file   Years of education: Not on file   Highest education level: Some college, no degree  Occupational History   Not on file  Tobacco Use   Smoking status: Former    Current packs/day: 0.25    Average packs/day: 0.3 packs/day for 50.0 years (12.5 ttl pk-yrs)    Types: Cigarettes   Smokeless tobacco: Never   Tobacco comments:    started smoking in 1958- stopped 2020 after a fall  Vaping Use   Vaping status: Never Used  Substance and Sexual Activity   Alcohol use: No   Drug use: No   Sexual activity: Never  Other Topics Concern   Not on file  Social History Narrative   From [place].     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  Was married for 47 years.     Active in church and senior groups.     Likes gospel singing.     Lives alone (with her dog Hayden Lipoma)   4 kids, all are nearby.     Social Drivers of Corporate investment banker Strain: Low Risk  (08/10/2022)   Overall Financial Resource Strain (CARDIA)    Difficulty of Paying Living Expenses: Not very hard  Food Insecurity: No Food  Insecurity (08/05/2023)   Hunger Vital Sign    Worried About Running Out of Food in the Last Year: Never true    Ran Out of Food in the Last Year: Never true  Transportation Needs: No Transportation Needs (08/05/2023)   PRAPARE - Administrator, Civil Service (Medical): No    Lack of Transportation (Non-Medical): No  Physical Activity: Inactive (08/10/2022)   Exercise Vital Sign    Days of Exercise per Week: 0 days    Minutes of Exercise per Session: 0 min  Stress: No Stress Concern Present (08/10/2022)   [employer] of Occupational Health - Occupational Stress Questionnaire    Feeling of Stress : Not at all  Social Connections: Socially Isolated (08/05/2023)   Social Connection and Isolation Panel [NHANES]  Frequency of Communication with Friends and Family: Once a week    Frequency of Social Gatherings with Friends and Family: Once a week    Attends Religious Services: Never    Database administrator or Organizations: No    Attends Banker Meetings: Never    Marital Status: Widowed  Intimate Partner Violence: Not At Risk (08/05/2023)   Humiliation, Afraid, Rape, and Kick questionnaire    Fear of Current or Ex-Partner: No    Emotionally Abused: No    Physically Abused: No    Sexually Abused: No    Functional Status Survey:    Family History  Problem Relation Age of Onset   Diabetes Father    Migraines Mother    Stroke Mother        TIAs   Breast cancer Neg Hx    Colon cancer Neg Hx     Health Maintenance  Topic Date Due   OPHTHALMOLOGY EXAM  Never done   COVID-19 Vaccine (4 - 2024-25 season) 12/23/2022   Zoster Vaccines- Shingrix (1 of 2) 10/16/2023 (Originally 10/05/1953)   INFLUENZA VACCINE  11/22/2023   HEMOGLOBIN A1C  01/11/2024   FOOT EXAM  07/15/2024   Medicare Annual Wellness (AWV)  07/15/2024   DTaP/Tdap/Td (3 - Td or Tdap) 02/06/2033   Pneumonia Vaccine 29+ Years old  Completed   DEXA SCAN  Completed   HPV VACCINES  Aged Out    Meningococcal B Vaccine  Aged Out    Allergies  Allergen Reactions   Codeine     Doesn't remember reaction   Cortisone     Doesn't remember reaction   Decongestant [Pseudoephedrine Hcl] Other (See Comments)    Avoid decongestants due to glaucoma hx.     Donepezil      Prolonged Qt   Naproxen     Stomach issues   Simvastatin      aches   Statins Other (See Comments)    Aches     Outpatient Encounter Medications as of 08/07/2023  Medication Sig   acetaminophen  (TYLENOL ) 325 MG tablet Take 1-2 tablets (325-650 mg total) by mouth every 6 (six) hours as needed for mild pain (pain score 1-3) (pain score 1-3 or temp > 100.5).   aspirin  EC 81 MG tablet Take 81 mg by mouth daily.   Cholecalciferol  (VITAMIN D ) 50 MCG (2000 UT) CAPS Take 1 capsule (2,000 Units total) by mouth daily.   [START ON 08/08/2023] digoxin  (LANOXIN ) 0.125 MG tablet Take 1 tablet (0.125 mg total) by mouth daily.   empagliflozin  (JARDIANCE ) 10 MG TABS tablet Take 10 mg by mouth daily.   HYDROcodone -acetaminophen  (NORCO/VICODIN) 5-325 MG tablet Take 1-2 tablets by mouth every 6 (six) hours as needed for up to 1 day for moderate pain (pain score 4-6).   lactose free nutrition (BOOST) LIQD Take 237 mLs by mouth 2 (two) times daily.   levothyroxine  (SYNTHROID ) 25 MCG tablet TAKE 1 TABLET BY MOUTH ONCE DAILY ON AN EMPTY STOMACH. WAIT 30 MINUTES BEFORE TAKING OTHER MEDS.   melatonin 5 MG TABS Take 2.5 mg by mouth at bedtime as needed.   memantine  (NAMENDA ) 5 MG tablet Take 5 mg by mouth 2 (two) times daily.   metoprolol  succinate (TOPROL -XL) 25 MG 24 hr tablet Take 12.5 mg by mouth daily.   midodrine  (PROAMATINE ) 10 MG tablet Take 10 mg by mouth 3 (three) times daily.   pantoprazole  (PROTONIX ) 20 MG tablet Take 20 mg by mouth daily.   polyethylene glycol (MIRALAX  /  GLYCOLAX ) 17 g packet Take 17 g by mouth daily.   Potassium 99 MG TABS Take 2 tablets (198 mg total) by mouth daily.   senna-docusate (SENOKOT-S) 8.6-50 MG  tablet Take 1 tablet by mouth 2 (two) times daily as needed for mild constipation.   torsemide (DEMADEX) 10 MG tablet Take 10 mg by mouth daily.   Zinc Oxide (TRIPLE PASTE) 40 % PSTE Apply 1 Application topically as needed (to buttocks and sacrum).   feeding supplement (ENSURE ENLIVE / ENSURE PLUS) LIQD Take 237 mLs by mouth 2 (two) times daily between meals. (Patient not taking: Reported on 08/07/2023)   Facility-Administered Encounter Medications as of 08/07/2023  Medication   acetaminophen  (TYLENOL ) tablet 325-650 mg   alum & mag hydroxide-simeth (MAALOX/MYLANTA) 200-200-20 MG/5ML suspension 30 mL   aspirin  EC tablet 81 mg   bisacodyl  (DULCOLAX) suppository 10 mg   Chlorhexidine  Gluconate Cloth 2 % PADS 6 each   cholecalciferol  (VITAMIN D3) tablet 2,000 Units   digoxin  (LANOXIN ) tablet 0.125 mg   empagliflozin  (JARDIANCE ) tablet 10 mg   feeding supplement (ENSURE ENLIVE / ENSURE PLUS) liquid 237 mL   ferrous sulfate  tablet 325 mg   gabapentin  (NEURONTIN ) capsule 200 mg   HYDROcodone -acetaminophen  (NORCO/VICODIN) 5-325 MG per tablet 1-2 tablet   insulin  aspart (novoLOG ) injection 0-5 Units   insulin  aspart (novoLOG ) injection 0-9 Units   ipratropium-albuterol  (DUONEB) 0.5-2.5 (3) MG/3ML nebulizer solution 3 mL   levothyroxine  (SYNTHROID ) tablet 25 mcg   magnesium  hydroxide (MILK OF MAGNESIA) suspension 30 mL   memantine  (NAMENDA ) tablet 5 mg   menthol -cetylpyridinium (CEPACOL) lozenge 3 mg   Or   phenol (CHLORASEPTIC) mouth spray 1 spray   metoCLOPramide  (REGLAN ) tablet 5-10 mg   Or   metoCLOPramide  (REGLAN ) injection 5-10 mg   metoprolol  succinate (TOPROL -XL) 24 hr tablet 12.5 mg   morphine  (PF) 2 MG/ML injection 0.5-1 mg   multivitamin with minerals tablet 1 tablet   senna (SENOKOT) tablet 8.6 mg   sodium phosphate  (FLEET) enema 1 enema    Review of Systems  Vitals:   08/07/23 1651  BP: (!) 150/72  Pulse: 72  Temp: 98 F (36.7 C)  SpO2: 96%   There is no height or  weight on file to calculate BMI. Physical Exam Constitutional:      Appearance: Normal appearance.  Cardiovascular:     Rate and Rhythm: Normal rate. Rhythm irregular.  Pulmonary:     Breath sounds: Wheezing and rhonchi present.  Abdominal:     General: Abdomen is flat. Bowel sounds are normal.     Palpations: Abdomen is soft.  Musculoskeletal:        General: No swelling or tenderness.  Skin:    General: Skin is warm and dry.     Comments: Left leg wound clean/dry/intact  Neurological:     Mental Status: She is alert. She is disoriented.  Psychiatric:        Mood and Affect: Mood normal.     Labs reviewed: Basic Metabolic Panel: Recent Labs    02/08/23 0455 02/09/23 0631 02/10/23 0656 02/11/23 0352 02/12/23 0349 02/13/23 0932 08/05/23 0435 08/06/23 0434 08/07/23 0322  NA 140 137   < > 140 138   < > 139 138 135  K 4.2 4.6   < > 3.4* 3.8   < > 4.3 3.9 4.1  CL 110 108   < > 104 102   < > 105 106 106  CO2 22 22   < > 27 27   < >  22 23 24   GLUCOSE 96 149*   < > 86 99   < > 234* 86 222*  BUN 21 27*   < > 18 16   < > 24* 26* 22  CREATININE 1.08* 1.19*   < > 0.60 0.56   < > 1.28* 0.90 0.83  CALCIUM  8.2* 8.3*   < > 8.0* 8.2*   < > 8.1* 8.3* 8.6*  MG 1.7 2.3  --  1.9 2.0  --   --   --   --   PHOS 4.1 4.4  --   --  2.8  --   --   --   --    < > = values in this interval not displayed.   Liver Function Tests: Recent Labs    02/07/23 0814 02/10/23 0656 07/11/23 0000  AST 37 47* 14  ALT 17 7 5*  ALKPHOS 37* 37* 73  BILITOT 0.7 0.9  --   PROT 5.0* 4.9*  --   ALBUMIN 2.8* 2.6* 3.7   Recent Labs    02/07/23 0814  LIPASE 28   No results for input(s): "AMMONIA" in the last 8760 hours. CBC: Recent Labs    02/07/23 0814 02/07/23 1424 02/21/23 0000 04/11/23 0000 07/11/23 0000 08/05/23 0435 08/06/23 0434 08/07/23 0322  WBC 20.2*   < > 9.0 10.6   < > 16.0* 11.7* 10.0  NEUTROABS 17.1*  --  6,642.00 8,226.00  --   --   --   --   HGB 10.0*   < > 10.8* 13.2   < >  10.6* 8.8* 8.3*  HCT 31.1*   < > 34* 40   < > 34.4* 27.6* 26.1*  MCV 93.4   < >  --   --    < > 91.0 89.3 89.7  PLT 208   < > 526* 334   < > 210 177 200   < > = values in this interval not displayed.   Cardiac Enzymes: Recent Labs    02/07/23 0814  CKTOTAL 336*   BNP: Invalid input(s): "POCBNP" Lab Results  Component Value Date   HGBA1C 8.3 07/11/2023   Lab Results  Component Value Date   TSH 2.176 08/05/2023   Lab Results  Component Value Date   VITAMINB12 448 05/08/2022   No results found for: "FOLATE" No results found for: "IRON", "TIBC", "FERRITIN"  Imaging and Procedures obtained prior to SNF admission: CT HEAD WO CONTRAST ( ) Result Date: 08/05/2023 CLINICAL DATA:  unwitnessed fall EXAM: CT HEAD WITHOUT CONTRAST TECHNIQUE: Contiguous axial images were obtained from the base of the skull through the vertex without intravenous contrast. RADIATION DOSE REDUCTION: This exam was performed according to the departmental dose-optimization program which includes automated exposure control, adjustment of the mA and/or kV according to patient size and/or use of iterative reconstruction technique. COMPARISON:  CT head February 07, 2023. FINDINGS: Brain: No evidence of acute infarction, hemorrhage, hydrocephalus, extra-axial collection or mass lesion/mass effect. Patchy white matter hypodensities, nonspecific but compatible with chronic microvascular disease. Vascular: No hyperdense vessel or unexpected calcification. Skull: No acute fracture. Sinuses/Orbits: Clear sinuses.  No acute orbital findings. Other: No mastoid effusions. IMPRESSION: No evidence of acute intracranial abnormality. Electronically Signed   By: Stevenson Elbe M.D.   On: 08/05/2023 19:44   US  ARTERIAL ABI (SCREENING LOWER EXTREMITY) Result Date: 08/05/2023 CLINICAL DATA:  Absent pulses in the lower extremities. EXAM: NONINVASIVE PHYSIOLOGIC VASCULAR STUDY OF BILATERAL LOWER EXTREMITIES TECHNIQUE: Evaluation of both  lower  extremities were performed at rest, including calculation of ankle-brachial indices with single level pressure measurements and doppler recording. COMPARISON:  None available. FINDINGS: Right ABI:  0.93 Left ABI:  1.30 Right Lower Extremity: Posterior tibial and dorsalis pedis artery waveforms are monophasic. Left Lower Extremity: Posterior tibial and dorsalis pedis artery waveforms are monophasic. 0.9-0.99 Borderline PAD IMPRESSION: At least borderline lower extremity peripheral arterial disease in the right lower extremity. The degree of stenosis may be more significant given abnormal bilateral waveforms. Consider further evaluation with CT angiography lower extremities. Electronically Signed   By: Elester Grim M.D.   On: 08/05/2023 11:26   DG HIP UNILAT WITH PELVIS 2-3 VIEWS LEFT Result Date: 08/04/2023 CLINICAL DATA:  Left intramedullary nail EXAM: DG HIP (WITH OR WITHOUT PELVIS) 2-3V LEFT COMPARISON:  Left hip x-ray 08/04/2023. FINDINGS: Three intraoperative fluoroscopic views of the left hip. Left femoral intramedullary nail and hip screw replaced. Alignment is anatomic as can be seen. IMPRESSION: Left femoral intramedullary nail and hip screw replaced. Electronically Signed   By: Tyron Gallon M.D.   On: 08/04/2023 20:27   DG C-Arm 1-60 Min-No Report Result Date: 08/04/2023 Fluoroscopy was utilized by the requesting physician.  No radiographic interpretation.   DG C-Arm 1-60 Min-No Report Result Date: 08/04/2023 Fluoroscopy was utilized by the requesting physician.  No radiographic interpretation.   DG Chest Portable 1 View Result Date: 08/04/2023 CLINICAL DATA:  Pain, LEFT hip fall. EXAM: PORTABLE CHEST 1 VIEW COMPARISON:  Radiograph 02/09/2023 FINDINGS: Large hiatal hernia posterior to the heart. Entire stomach is above the hemidiaphragm. No pulmonary edema or pleural fluid. No pneumothorax. No acute osseous abnormality. IMPRESSION: 1. No acute cardiopulmonary process. 2. Large hiatal  hernia. Electronically Signed   By: Deboraha Fallow M.D.   On: 08/04/2023 13:11   DG Hip Unilat With Pelvis 2-3 Views Left Result Date: 08/04/2023 CLINICAL DATA:  Fall, LEFT hip pain EXAM: DG HIP (WITH OR WITHOUT PELVIS) 2-3V LEFT COMPARISON:  None Available. FINDINGS: Intertrochanteric fracture of the LEFT femoral neck. Mild caudad migration of the femur. No angulation. Avulsion of the lesser trochanter. Lucency in the LEFT inferior pubic ramus. Potential aggressive osseous lesion. Internal fixation of the RIGHT hip. IMPRESSION: Intertrochanteric fracture of the LEFT femoral neck. Lucency in the LEFT inferior pubic ramus. Potential aggressive osseous lesion. Electronically Signed   By: Deboraha Fallow M.D.   On: 08/04/2023 13:09    Assessment/Plan Left hip fracture DOI 08/04/2023 Patient with fall on 4/13 leading to injury. S/p ORIF. Wound C/D/I. She has scheudled f/u in 2 weeks. No AC based on GOC. Continue supportive care at this time. Recent fall resulted in fracture and hospital admission. Procedure performed for fracture. No acute issues on ABIs. - Follow up with vascular surgery as scheduled  Atrial Fibrillation with Rapid Ventricular Response (RVR) Atrial fibrillation with RVR managed with digoxin . Anticoagulation not initiated due to recent fall and bleeding risk. Digoxin  aids in rate control and blood pressure management. - Continue digoxin  for rate control - Monitor digoxin  levels - Discuss anticoagulation risks and benefits with family  Diastolic Heart Failure (HFpEF) Diastolic heart failure, class II, with ejection fraction of 45-50% as of October 2024. No recent exacerbation. - Review cardiology notes for further management - Ensure continuation of appropriate heart failure medications  Hypertension Chronic hypertension with slightly lower blood pressures. Midodrine  was recommended but not listed in the medication regimen. - Restart midodrine  three times a day to manage blood  pressure  Dementia Disorientation consistent with baseline  dementia. Managed with Memantine . - Continue Memantine  for dementia management   Goals of Care Discussion needed with family regarding future hospital admissions and care preferences. - Discuss with family about preferences for future hospital admissions - Document family preferences in medical record  Follow-up Post-hospitalization follow-up care and monitoring required. - Order CBC and BMP labs - Ensure digoxin  level is checked - Have Camilo Cella see her tomorrow - Instruct staff to check vital signs at least once per shift and twice per next shift   Family/ staff Communication: Daughter, Ahmed Ales. Nursing, nurse manager  Labs/tests ordered: CBC, BMP  I spent greater than 35  minutes for the care of this patient in face to face time, chart review, clinical documentation, patient education. I spent an additional 16 minutes discussing goals of care and advanced care planning.

## 2023-08-07 NOTE — Progress Notes (Signed)
 [facility] Liaison Note  08/07/2023  Vanessa [name] 05-Oct-1934 [id number]  Location: RN Hospital Liaison screened the patient remotely at [facility].  Insurance: [employer]age   Vanessa [name] is a 89 y.o. female who is a Primary Care Patient of Beamer, Jamilynn Whitacre Rideau, MD [facility].The patient was screened for readmission hospitalization with noted high risk score for unplanned readmission risk with 2 IP in 6 months.  The patient was assessed for potential Care Management service needs for post hospital transition for care coordination. Review of patient's electronic medical record reveals patient was admitted with Femur Fracture. Pt recommended for SNF and will transition today to [facility] for [employer]. This facility will continue to address pt's needs post hospital discharge.   VBCI Care Management/Population Health does not replace or interfere with any arrangements made by the Inpatient Transition of Care team.   For questions contact:   Lilla Reichert, RN, BSN Hospital Liaison [place]   [facility], Population Health Office Hours MTWF  8:00 am-6:00 pm Direct Dial: [phone] mobile [email or web address]

## 2023-08-07 NOTE — TOC Progression Note (Signed)
 Transition of Care [facility]) - Progression Note    Patient Details  Name: Vanessa [name] MRN: [id number] Date of Birth: 1934-05-28  Transition of Care [facility]) CM/SW Contact  Makenzi Bannister A Irem Stoneham, RN Phone Number: 08/07/2023, 9:14 AM  Clinical Narrative:    Call received from [facility] with [employer].  She informs me that patient has been approved for SNF.  She reports that patient has been approved for 7 days.  Auth approval number is [id number].  Ambulance approval number is [id number].    I have made unit Case Manager aware of the above information.     Expected Discharge Plan: Skilled Nursing Facility Barriers to Discharge: No Barriers Identified  Expected Discharge Plan and Services       Living arrangements for the past 2 months: Skilled Nursing Facility (Patient was a long term care resident of [facility].)                                       Social Determinants of Health (SDOH) Interventions SDOH Screenings   Food Insecurity: No Food Insecurity (08/05/2023)  Housing: Unknown (08/05/2023)  Transportation Needs: No Transportation Needs (08/05/2023)  Utilities: Not At Risk (08/05/2023)  Alcohol Screen: Low Risk  (06/13/2022)  Depression (PHQ2-9): Low Risk  (07/16/2023)  Financial Resource Strain: Low Risk  (08/10/2022)  Physical Activity: Unknown (08/10/2022)  Recent Concern: Physical Activity - Inactive (08/10/2022)  Social Connections: Socially Isolated (08/05/2023)  Stress: No Stress Concern Present (08/10/2022)  Tobacco Use: Medium Risk (08/04/2023)    Readmission Risk Interventions     No data to display

## 2023-08-07 NOTE — Care Management Important Message (Signed)
 Important Message  Patient Details  Name: Vanessa [name] MRN: [id number] Date of Birth: Jul 10, 1934   Important Message Given:  Yes - Medicare IM     Anise Kerns 08/07/2023, 12:33 PM

## 2023-08-07 NOTE — Progress Notes (Signed)
 Physical Therapy Treatment Patient Details Name: Vanessa [name] MRN: [id number] DOB: 1934/08/28 Today's Date: 08/07/2023   History of Present Illness Patient is a 89 year old female with fall with left hip fracture s/p left hip repair with post-op A-fib with RVR and hypotension requiring vasopressors in PACU. History of heart failure, COPD, dementia, diabetes mellitus, pulmonary hypertension.    PT Comments  Patient is agreeable to PT session. She is pleasantly confused. Patient continues to require assistance with mobility. Three standing bouts performed with +2 person assistance and pre-gait activity with standing. She will need continued PT to maximize independence and decrease caregiver burden. Rehabilitation < 3 hours/day recommended after this hospital stay.    If plan is discharge home, recommend the following: Two people to help with walking and/or transfers;A lot of help with bathing/dressing/bathroom;Assist for transportation;Help with stairs or ramp for entrance;Supervision due to cognitive status;Assistance with cooking/housework   Can travel by private vehicle     No  Equipment Recommendations  None recommended by PT    Recommendations for Other Services       Precautions / Restrictions Precautions Precautions: Fall Recall of Precautions/Restrictions: Impaired Restrictions Weight Bearing Restrictions Per Provider Order: Yes LLE Weight Bearing Per Provider Order: Partial weight bearing LLE Partial Weight Bearing Percentage or Pounds: 50%     Mobility  Bed Mobility Overal bed mobility: Needs Assistance Bed Mobility: Supine to Sit, Sit to Supine     Supine to sit: Max assist, +2 for physical assistance Sit to supine: Max assist, +2 for physical assistance   General bed mobility comments: cues for sequencing and technique    Transfers Overall transfer level: Needs assistance Equipment used: Rolling walker (2 wheels) Transfers: Sit to/from Stand Sit to Stand:  Mod assist, +2 physical assistance           General transfer comment: 3 standing bouts performed. cues for anterior weight shifting. lifting and lowering assistance required    Ambulation/Gait             Pre-gait activities: patient abel to take sevearal side steps to the left with rolling walker with +2 person assistance using rolling walker. further standing tolerance limited by fatigue and pain     Stairs             Wheelchair Mobility     Tilt Bed    Modified Rankin (Stroke Patients Only)       Balance Overall balance assessment: Needs assistance Sitting-balance support: Feet supported Sitting balance-Leahy Scale: Fair     Standing balance support: Bilateral upper extremity supported Standing balance-Leahy Scale: Poor Standing balance comment: external support required to maintain standing balance. standing tolerance around 1 minute                            Communication Communication Communication: No apparent difficulties  Cognition Arousal: Alert Behavior During Therapy: WFL for tasks assessed/performed   PT - Cognitive impairments: History of cognitive impairments                       PT - Cognition Comments: She is disoriented to time, situation, place. She is able to follow commands with cues for redirection Following commands: Impaired Following commands impaired: Follows one step commands with increased time    Cueing Cueing Techniques: Verbal cues, Tactile cues, Visual cues  Exercises      General Comments General comments (skin integrity, edema, etc.): heart rate  briefly up to 121bpm with activity.      Pertinent Vitals/Pain Pain Assessment Pain Assessment: Faces Faces Pain Scale: Hurts little more Pain Location: L hip Pain Descriptors / Indicators: Discomfort Pain Intervention(s): Limited activity within patient's tolerance, Monitored during session, Repositioned    Home Living                           Prior Function            PT Goals (current goals can now be found in the care plan section) Acute Rehab PT Goals Patient Stated Goal: to return to [facility] PT Goal Formulation: With patient/family Time For Goal Achievement: 08/19/23 Potential to Achieve Goals: Good Progress towards PT goals: Progressing toward goals    Frequency    7X/week      PT Plan      Co-evaluation PT/OT/SLP Co-Evaluation/Treatment: Yes Reason for Co-Treatment: To address functional/ADL transfers PT goals addressed during session: Mobility/safety with mobility OT goals addressed during session: ADL's and self-care      AM-PAC PT "6 Clicks" Mobility   Outcome Measure  Help needed turning from your back to your side while in a flat bed without using bedrails?: A Lot Help needed moving from lying on your back to sitting on the side of a flat bed without using bedrails?: A Lot Help needed moving to and from a bed to a chair (including a wheelchair)?: Total Help needed standing up from a chair using your arms (e.g., wheelchair or bedside chair)?: Total Help needed to walk in hospital room?: Total Help needed climbing 3-5 steps with a railing? : Total 6 Click Score: 8    End of Session Equipment Utilized During Treatment: Gait belt Activity Tolerance: Patient tolerated treatment well Patient left: in bed;with call bell/phone within reach;with bed alarm set;with SCD's reapplied;with family/visitor present   PT Visit Diagnosis: Other abnormalities of gait and mobility (R26.89);Difficulty in walking, not elsewhere classified (R26.2)     Time: 2952-8413 PT Time Calculation (min) (ACUTE ONLY): 26 min  Charges:    $Therapeutic Activity: 8-22 mins PT General Charges $$ ACUTE PT VISIT: 1 Visit                     Ozie Bo, PT, MPT   Vanessa [name] 08/07/2023, 11:35 AM

## 2023-08-07 NOTE — TOC Transition Note (Signed)
 Transition of Care [facility]) - Discharge Note   Patient Details  Name: Vanessa [name] MRN: [id number] Date of Birth: 1934/10/07  [facility] CM/SW Contact:  Hyde Sires C Eliceo Gladu, RN Phone Number: 08/07/2023, 1:12 PM   Clinical Narrative:     Per facility patient admission confirmed for today. Patient assigned room # 308 Report will be called to [phone] Face sheet and medical necessity forms printed to the floor to be added to the EMS pack EMS arranged " Third on the list." Discharge summary and SNF transfer report sent in HUB.  Nurse, and family notified spoke with her daughter, Carlisle Cheshire signing off.    Final next level of care: Skilled Nursing Facility Barriers to Discharge: Barriers Resolved   Patient Goals and CMS Choice Patient states their goals for this hospitalization and ongoing recovery are:: SNF          Discharge Placement              Patient chooses bed at: [facility] Patient to be transferred to facility by: [facility] Name of family member notified: [name] Patient and family notified of of transfer: 08/07/23  Discharge Plan and Services Additional resources added to the After Visit Summary for                                       Social Drivers of Health (SDOH) Interventions SDOH Screenings   Food Insecurity: No Food Insecurity (08/05/2023)  Housing: Unknown (08/05/2023)  Transportation Needs: No Transportation Needs (08/05/2023)  Utilities: Not At Risk (08/05/2023)  Alcohol Screen: Low Risk  (06/13/2022)  Depression (PHQ2-9): Low Risk  (07/16/2023)  Financial Resource Strain: Low Risk  (08/10/2022)  Physical Activity: Unknown (08/10/2022)  Recent Concern: Physical Activity - Inactive (08/10/2022)  Social Connections: Socially Isolated (08/05/2023)  Stress: No Stress Concern Present (08/10/2022)  Tobacco Use: Medium Risk (08/04/2023)     Readmission Risk Interventions     No data to display

## 2023-08-07 NOTE — Progress Notes (Signed)
 Subjective: 3 Days Post-Op Procedure(s) (LRB): FIXATION, FRACTURE, INTERTROCHANTERIC, WITH INTRAMEDULLARY ROD (Left) Patient is out of ICU now and doing better.  Back in sinus rhythm.  Hip pain is mild.  Dressing is dry.  Slow progress with PT.  Hemoglobin is 8.3.  Patient reports pain as mild.  Objective:   VITALS:   Vitals:   08/07/23 0835 08/07/23 1100  BP:  110/73  Pulse: 69 (!) 106  Resp:  16  Temp:  (!) 97.5 F (36.4 C)  SpO2:  100%    Neurologically intact Neurovascular intact Dorsiflexion/Plantar flexion intact Incision: dressing C/D/I  LABS Recent Labs    08/05/23 0435 08/06/23 0434 08/07/23 0322  HGB 10.6* 8.8* 8.3*  HCT 34.4* 27.6* 26.1*  WBC 16.0* 11.7* 10.0  PLT 210 177 200    Recent Labs    08/05/23 0435 08/06/23 0434 08/07/23 0322  NA 139 138 135  K 4.3 3.9 4.1  BUN 24* 26* 22  CREATININE 1.28* 0.90 0.83  GLUCOSE 234* 86 222*    Recent Labs    08/04/23 1705  INR 1.1     Assessment/Plan: 3 Days Post-Op Procedure(s) (LRB): FIXATION, FRACTURE, INTERTROCHANTERIC, WITH INTRAMEDULLARY ROD (Left)   Advance diet Up with therapy Discharge to SNF when medically stable. Discharge on 81 mg ASA twice daily for 8 weeks 50% weightbearing left hip with walker Return to my office in 2 weeks after discharge for x-rays and staple removal

## 2023-08-07 NOTE — Progress Notes (Signed)
 Occupational Therapy Treatment Patient Details Name: Vanessa [name] MRN: [id number] DOB: 1934-08-24 Today's Date: 08/07/2023   History of present illness Patient is a 89 year old female with fall with left hip fracture s/p left hip repair with post-op A-fib with RVR and hypotension requiring vasopressors in PACU. History of heart failure, COPD, dementia, diabetes mellitus, pulmonary hypertension.   OT comments  Vanessa [name] was seen for OT/PT co-treatment on this date. Upon arrival to room pt in bed, pleasantly confused and agreeable to tx. Pt requires MAX A x2 for bed mobility. Initial MIN A improving to CGA for static sitting. MOD A x2 + RW sit<>stand x3 trials, MAX A pericare standing. Pt making good progress toward goals, will continue to follow POC. Discharge recommendation remains appropriate.        If plan is discharge home, recommend the following:  Two people to help with walking and/or transfers;Two people to help with bathing/dressing/bathroom   Equipment Recommendations  Other (comment) (defer)    Recommendations for Other Services      Precautions / Restrictions Precautions Precautions: Fall Recall of Precautions/Restrictions: Impaired Restrictions Weight Bearing Restrictions Per Provider Order: Yes LLE Weight Bearing Per Provider Order: Partial weight bearing LLE Partial Weight Bearing Percentage or Pounds: 50%       Mobility Bed Mobility Overal bed mobility: Needs Assistance Bed Mobility: Supine to Sit, Sit to Supine     Supine to sit: Max assist, +2 for physical assistance Sit to supine: Max assist, +2 for physical assistance        Transfers Overall transfer level: Needs assistance Equipment used: Rolling walker (2 wheels) Transfers: Sit to/from Stand Sit to Stand: Mod assist, +2 physical assistance           General transfer comment: x3 stands and lateral side steps ~2 ft     Balance Overall balance assessment: Needs  assistance Sitting-balance support: Feet supported Sitting balance-Leahy Scale: Fair     Standing balance support: Bilateral upper extremity supported Standing balance-Leahy Scale: Poor                             ADL either performed or assessed with clinical judgement   ADL Overall ADL's : Needs assistance/impaired                                       General ADL Comments: MAX A don B socks in sittting. MOD A x2 + RW simulated BSC t/f, MAX A pericare standing     Communication Communication Communication: No apparent difficulties   Cognition Arousal: Alert Behavior During Therapy: WFL for tasks assessed/performed Cognition: History of cognitive impairments                               Following commands: Impaired Following commands impaired: Follows one step commands with increased time      Cueing   Cueing Techniques: Verbal cues, Tactile cues, Visual cues             Pertinent Vitals/ Pain       Pain Assessment Pain Assessment: Faces Faces Pain Scale: Hurts little more Pain Location: L hip Pain Descriptors / Indicators: Discomfort Pain Intervention(s): Limited activity within patient's tolerance, Repositioned   Frequency  Min 2X/week        Progress Toward  Goals  OT Goals(current goals can now be found in the care plan section)  Progress towards OT goals: Progressing toward goals  Acute Rehab OT Goals OT Goal Formulation: With patient/family Time For Goal Achievement: 08/19/23 Potential to Achieve Goals: Fair ADL Goals Pt Will Perform Grooming: with set-up;with supervision;sitting Pt Will Perform Lower Body Dressing: with min assist;with caregiver independent in assisting;sit to/from stand Pt Will Transfer to Toilet: with min assist;ambulating;regular height toilet  Plan      Co-evaluation    PT/OT/SLP Co-Evaluation/Treatment: Yes Reason for Co-Treatment: To address functional/ADL transfers PT  goals addressed during session: Mobility/safety with mobility OT goals addressed during session: ADL's and self-care      AM-PAC OT "6 Clicks" Daily Activity     Outcome Measure   Help from another person eating meals?: None Help from another person taking care of personal grooming?: A Little Help from another person toileting, which includes using toliet, bedpan, or urinal?: A Lot Help from another person bathing (including washing, rinsing, drying)?: A Lot Help from another person to put on and taking off regular upper body clothing?: A Little Help from another person to put on and taking off regular lower body clothing?: A Lot 6 Click Score: 16    End of Session    OT Visit Diagnosis: Other abnormalities of gait and mobility (R26.89);Muscle weakness (generalized) (M62.81)   Activity Tolerance Patient tolerated treatment well   Patient Left in bed;with call bell/phone within reach;with bed alarm set;with family/visitor present   Nurse Communication          Time: 3244-0102 OT Time Calculation (min): 25 min  Charges: OT General Charges $OT Visit: 1 Visit OT Treatments $Self Care/Home Management : 8-22 mins  Vanessa [name], M.S. OTR/L  08/07/23, 11:25 AM  ascom [phone]

## 2023-08-07 NOTE — Progress Notes (Signed)
 [facility] CLINIC CARDIOLOGY PROGRESS NOTE       Patient ID: Vanessa [name] MRN: [id number] DOB/AGE: 87-Jun-1936 87 y.o.  Admit date: 08/04/2023 Referring Physician Dr., Ashok Pall Primary Physician Earnestine Mealing, MD  Primary Cardiologist Dr Juliann Pares Reason for Consultation AF RVR  HPI: Vanessa [name] is a 89 y.o. female  with a past medical history of chronic combined systolic and diastolic heart failure, paroxysmal atrial fibrillation, Hx SVT, chronic hypotension on midodrine, hyperlipidemia, COPD, type 2 diabetes mellitus who presented to the ED on 08/04/2023 following an unwitnessed fall that resulted in a fracture of the left femoral neck requiring surgery. In the setting of left hip pain patient went into atrial fibrillation, this is not new for the patient. Cardiology was consulted for further evaluation.   Interval history: -Patient seen and examined, reports she is feeling well today.  -Continues to be confused but states that she is not having chest pain, SOB, or palpitations.  -Back in NSR on tele this AM. BP improved.  Review of systems complete and found to be negative unless listed above    Past Medical History:  Diagnosis Date   Anemia    treated ~2009 with iron, resolved   Arthritis    Cataract    surgery on R catarct 2012   Chronic systolic CHF (congestive heart failure) (HCC)    a. TTE 2012: EF of 45-50%, mild diffuse HK, trivial AI, mild MR, mildly dilated RV with mild reduction of RVSF, mild to moderate TR, mild to moderate PR, mildly elevated PASP   COPD (chronic obstructive pulmonary disease) (HCC)    Family history of adverse reaction to anesthesia    Mother - altered mental status (long term)   Glaucoma    History of pelvic fracture    Hyperglycemia    mild inc in fasting sugar   Hyperlipidemia    Hypothyroidism    Insomnia    Migraines    without aura, sx since age 36   Osteopenia    prev with 10 years of fosamax and intolerant of evista   PAF  (paroxysmal atrial fibrillation) (HCC)    a. initially noted 2012; b. CHADS2VASc at least 5 (CHF, age x 2, vasacular disease, female)   Pulmonary hypertension (HCC)    Wears dentures    full upper and lower    Past Surgical History:  Procedure Laterality Date   CATARACT EXTRACTION     CATARACT EXTRACTION W/PHACO Left 08/27/2016   Procedure: CATARACT EXTRACTION PHACO AND INTRAOCULAR LENS PLACEMENT (IOC)  Left;  Surgeon: Lockie Mola, MD;  Location: [facility];  Service: Ophthalmology;  Laterality: Left;   COLONOSCOPY  2012   INTRAMEDULLARY (IM) NAIL INTERTROCHANTERIC Right 02/07/2023   Procedure: INTRAMEDULLARY (IM) NAIL INTERTROCHANTERIC;  Surgeon: Christena Flake, MD;  Location: [facility];  Service: Orthopedics;  Laterality: Right;   INTRAMEDULLARY (IM) NAIL INTERTROCHANTERIC Left 08/04/2023   Procedure: FIXATION, FRACTURE, INTERTROCHANTERIC, WITH INTRAMEDULLARY ROD;  Surgeon: Deeann Saint, MD;  Location: [facility];  Service: Orthopedics;  Laterality: Left;   REFRACTIVE SURGERY      Medications Prior to Admission  Medication Sig Dispense Refill Last Dose/Taking   acetaminophen (TYLENOL) 325 MG tablet Take 1-2 tablets (325-650 mg total) by mouth every 6 (six) hours as needed for mild pain (pain score 1-3) (pain score 1-3 or temp > 100.5).   Taking As Needed   aspirin EC 81 MG tablet Take 81 mg by mouth daily.   08/04/2023   Cholecalciferol (VITAMIN D) 50  MCG (2000 UT) CAPS Take 1 capsule (2,000 Units total) by mouth daily.   08/04/2023   empagliflozin (JARDIANCE) 10 MG TABS tablet Take 10 mg by mouth daily.   08/04/2023   levothyroxine (SYNTHROID) 25 MCG tablet TAKE 1 TABLET BY MOUTH ONCE DAILY ON AN EMPTY STOMACH. WAIT 30 MINUTES BEFORE TAKING OTHER MEDS. 90 tablet 1 08/04/2023   melatonin 5 MG TABS Take 2.5 mg by mouth at bedtime as needed.   Past Week   memantine (NAMENDA) 5 MG tablet Take 5 mg by mouth 2 (two) times daily.   08/04/2023   metoprolol succinate (TOPROL-XL) 25 MG 24  hr tablet Take 12.5 mg by mouth daily.   08/04/2023 Morning   midodrine (PROAMATINE) 10 MG tablet Take 10 mg by mouth 3 (three) times daily.   08/04/2023   pantoprazole (PROTONIX) 20 MG tablet Take 20 mg by mouth daily.   Taking   polyethylene glycol (MIRALAX / GLYCOLAX) 17 g packet Take 17 g by mouth daily. 14 each 0 08/03/2023   Potassium 99 MG TABS Take 2 tablets (198 mg total) by mouth daily.   08/04/2023   senna-docusate (SENOKOT-S) 8.6-50 MG tablet Take 1 tablet by mouth 2 (two) times daily as needed for mild constipation.   Taking As Needed   torsemide (DEMADEX) 10 MG tablet Take 10 mg by mouth daily.   08/04/2023   Zinc Oxide (TRIPLE PASTE) 40 % PSTE Apply 1 Application topically as needed (to buttocks and sacrum).   Past Week   feeding supplement (ENSURE ENLIVE / ENSURE PLUS) LIQD Take 237 mLs by mouth 2 (two) times daily between meals. 237 mL 12    Social History   Socioeconomic History   Marital status: Widowed    Spouse name: Not on file   Number of children: Not on file   Years of education: Not on file   Highest education level: Some college, no degree  Occupational History   Not on file  Tobacco Use   Smoking status: Former    Current packs/day: 0.25    Average packs/day: 0.3 packs/day for 50.0 years (12.5 ttl pk-yrs)    Types: Cigarettes   Smokeless tobacco: Never   Tobacco comments:    started smoking in 1958- stopped 2020 after a fall  Vaping Use   Vaping status: Never Used  Substance and Sexual Activity   Alcohol use: No   Drug use: No   Sexual activity: Never  Other Topics Concern   Not on file  Social History Narrative   From [place].     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  Was married for 47 years.     Active in church and senior groups.     Likes gospel singing.     Lives alone (with her dog Kirt Boys)   4 kids, all are nearby.     Social Drivers of Corporate investment banker Strain: Low Risk  (08/10/2022)   Overall Financial Resource Strain  (CARDIA)    Difficulty of Paying Living Expenses: Not very hard  Food Insecurity: No Food Insecurity (08/05/2023)   Hunger Vital Sign    Worried About Running Out of Food in the Last Year: Never true    Ran Out of Food in the Last Year: Never true  Transportation Needs: No Transportation Needs (08/05/2023)   PRAPARE - Administrator, Civil Service (Medical): No    Lack of Transportation (Non-Medical): No  Physical Activity: Unknown (  08/10/2022)   Exercise Vital Sign    Days of Exercise per Week: 0 days    Minutes of Exercise per Session: Not on file  Recent Concern: Physical Activity - Inactive (08/10/2022)   Exercise Vital Sign    Days of Exercise per Week: 0 days    Minutes of Exercise per Session: 0 min  Stress: No Stress Concern Present (08/10/2022)   [employer] of Occupational Health - Occupational Stress Questionnaire    Feeling of Stress : Not at all  Social Connections: Socially Isolated (08/05/2023)   Social Connection and Isolation Panel [NHANES]    Frequency of Communication with Friends and Family: Once a week    Frequency of Social Gatherings with Friends and Family: Once a week    Attends Religious Services: Never    Database administrator or Organizations: No    Attends Banker Meetings: Never    Marital Status: Widowed  Intimate Partner Violence: Not At Risk (08/05/2023)   Humiliation, Afraid, Rape, and Kick questionnaire    Fear of Current or Ex-Partner: No    Emotionally Abused: No    Physically Abused: No    Sexually Abused: No    Family History  Problem Relation Age of Onset   Diabetes Father    Migraines Mother    Stroke Mother        TIAs   Breast cancer Neg Hx    Colon cancer Neg Hx      Vitals:   08/07/23 0310 08/07/23 0500 08/07/23 0755 08/07/23 0835  BP: (!) 138/55  (!) 153/70   Pulse: 62  (!) 44 69  Resp: 18  16   Temp: 98.4 F (36.9 C)  (!) 97.5 F (36.4 C)   TempSrc:   Oral   SpO2: 96%  100%   Weight:  63.4  kg    Height:        PHYSICAL EXAM General: Well-appearing elderly female, well nourished, in no acute distress. HEENT: Normocephalic and atraumatic. Neck: No JVD.  Lungs: Normal respiratory effort on 4L. Clear bilaterally to auscultation. No wheezes, crackles, rhonchi.  Heart: Irregularly irregular, controlled heart rate. Normal S1 and S2 without gallops or murmurs.  Abdomen: Non-distended appearing.  Msk: Normal strength and tone for age. Extremities: Warm and well perfused. No clubbing, cyanosis.  No edema.  Neuro: Alert and oriented X 3. Psych: Answers questions appropriately.   Labs: Basic Metabolic Panel: Recent Labs    08/06/23 0434 08/07/23 0322  NA 138 135  K 3.9 4.1  CL 106 106  CO2 23 24  GLUCOSE 86 222*  BUN 26* 22  CREATININE 0.90 0.83  CALCIUM 8.3* 8.6*   Liver Function Tests: No results for input(s): "AST", "ALT", "ALKPHOS", "BILITOT", "PROT", "ALBUMIN" in the last 72 hours. No results for input(s): "LIPASE", "AMYLASE" in the last 72 hours. CBC: Recent Labs    08/06/23 0434 08/07/23 0322  WBC 11.7* 10.0  HGB 8.8* 8.3*  HCT 27.6* 26.1*  MCV 89.3 89.7  PLT 177 200   Cardiac Enzymes: Recent Labs    08/04/23 1312 08/04/23 1705  TROPONINIHS 65* 56*   BNP: Recent Labs    08/04/23 2214  BNP 320.6*   D-Dimer: No results for input(s): "DDIMER" in the last 72 hours. Hemoglobin A1C: No results for input(s): "HGBA1C" in the last 72 hours. Fasting Lipid Panel: No results for input(s): "CHOL", "HDL", "LDLCALC", "TRIG", "CHOLHDL", "LDLDIRECT" in the last 72 hours. Thyroid Function Tests: Recent Labs  08/05/23 0435  TSH 2.176   Anemia Panel: No results for input(s): "VITAMINB12", "FOLATE", "FERRITIN", "TIBC", "IRON", "RETICCTPCT" in the last 72 hours.   Radiology: CT HEAD WO CONTRAST ( ) Result Date: 08/05/2023 CLINICAL DATA:  unwitnessed fall EXAM: CT HEAD WITHOUT CONTRAST TECHNIQUE: Contiguous axial images were obtained from the base of  the skull through the vertex without intravenous contrast. RADIATION DOSE REDUCTION: This exam was performed according to the departmental dose-optimization program which includes automated exposure control, adjustment of the mA and/or kV according to patient size and/or use of iterative reconstruction technique. COMPARISON:  CT head February 07, 2023. FINDINGS: Brain: No evidence of acute infarction, hemorrhage, hydrocephalus, extra-axial collection or mass lesion/mass effect. Patchy white matter hypodensities, nonspecific but compatible with chronic microvascular disease. Vascular: No hyperdense vessel or unexpected calcification. Skull: No acute fracture. Sinuses/Orbits: Clear sinuses.  No acute orbital findings. Other: No mastoid effusions. IMPRESSION: No evidence of acute intracranial abnormality. Electronically Signed   By: Feliberto Harts M.D.   On: 08/05/2023 19:44   US ARTERIAL ABI (SCREENING LOWER EXTREMITY) Result Date: 08/05/2023 CLINICAL DATA:  Absent pulses in the lower extremities. EXAM: NONINVASIVE PHYSIOLOGIC VASCULAR STUDY OF BILATERAL LOWER EXTREMITIES TECHNIQUE: Evaluation of both lower extremities were performed at rest, including calculation of ankle-brachial indices with single level pressure measurements and doppler recording. COMPARISON:  None available. FINDINGS: Right ABI:  0.93 Left ABI:  1.30 Right Lower Extremity: Posterior tibial and dorsalis pedis artery waveforms are monophasic. Left Lower Extremity: Posterior tibial and dorsalis pedis artery waveforms are monophasic. 0.9-0.99 Borderline PAD IMPRESSION: At least borderline lower extremity peripheral arterial disease in the right lower extremity. The degree of stenosis may be more significant given abnormal bilateral waveforms. Consider further evaluation with CT angiography lower extremities. Electronically Signed   By: Acquanetta Belling M.D.   On: 08/05/2023 11:26   DG HIP UNILAT WITH PELVIS 2-3 VIEWS LEFT Result Date:  08/04/2023 CLINICAL DATA:  Left intramedullary nail EXAM: DG HIP (WITH OR WITHOUT PELVIS) 2-3V LEFT COMPARISON:  Left hip x-ray 08/04/2023. FINDINGS: Three intraoperative fluoroscopic views of the left hip. Left femoral intramedullary nail and hip screw replaced. Alignment is anatomic as can be seen. IMPRESSION: Left femoral intramedullary nail and hip screw replaced. Electronically Signed   By: Darliss Cheney M.D.   On: 08/04/2023 20:27   DG C-Arm 1-60 Min-No Report Result Date: 08/04/2023 Fluoroscopy was utilized by the requesting physician.  No radiographic interpretation.   DG C-Arm 1-60 Min-No Report Result Date: 08/04/2023 Fluoroscopy was utilized by the requesting physician.  No radiographic interpretation.   DG Chest Portable 1 View Result Date: 08/04/2023 CLINICAL DATA:  Pain, LEFT hip fall. EXAM: PORTABLE CHEST 1 VIEW COMPARISON:  Radiograph 02/09/2023 FINDINGS: Large hiatal hernia posterior to the heart. Entire stomach is above the hemidiaphragm. No pulmonary edema or pleural fluid. No pneumothorax. No acute osseous abnormality. IMPRESSION: 1. No acute cardiopulmonary process. 2. Large hiatal hernia. Electronically Signed   By: Genevive Bi M.D.   On: 08/04/2023 13:11   DG Hip Unilat With Pelvis 2-3 Views Left Result Date: 08/04/2023 CLINICAL DATA:  Fall, LEFT hip pain EXAM: DG HIP (WITH OR WITHOUT PELVIS) 2-3V LEFT COMPARISON:  None Available. FINDINGS: Intertrochanteric fracture of the LEFT femoral neck. Mild caudad migration of the femur. No angulation. Avulsion of the lesser trochanter. Lucency in the LEFT inferior pubic ramus. Potential aggressive osseous lesion. Internal fixation of the RIGHT hip. IMPRESSION: Intertrochanteric fracture of the LEFT femoral neck. Lucency in the LEFT inferior pubic  ramus. Potential aggressive osseous lesion. Electronically Signed   By: Genevive Bi M.D.   On: 08/04/2023 13:09    ECHO 02/08/2023  1. Left ventricular ejection fraction, by  estimation, is 45 to 50%. Left ventricular ejection fraction by PLAX is 49 %. The left ventricle has mildly decreased function. The left ventricle demonstrates global hypokinesis. Left ventricular diastolic parameters are indeterminate.   2. Right ventricular systolic function is normal. The right ventricular size is normal. There is normal pulmonary artery systolic pressure. The estimated right ventricular systolic pressure is 34.8 mmHg.   3. The mitral valve is grossly normal. Trivial mitral valve regurgitation.   4. The aortic valve is grossly normal. Aortic valve regurgitation is not visualized. Aortic valve sclerosis/calcification is present, without any evidence of aortic stenosis.   TELEMETRY reviewed by me 08/07/2023: NSR rate 70s  EKG reviewed by me: Pre-op: Sinus Rhythm, rate 60 bpm       Postop: Atrial fibrillation, rate 125 bpm  Data reviewed by me 08/07/2023: last 24h vitals tele labs imaging I/O hospitalist progress notes, ortho notes  Principal Problem:   Femur fracture (HCC) Active Problems:   Hypothyroid   Diabetes mellitus with hyperglycemia (HCC)   Hypotension   Chronic obstructive pulmonary disease (HCC)   Dementia (HCC)   AKI (acute kidney injury) (HCC)   Protein-calorie malnutrition, unspecified severity (HCC)   Diastolic CHF with preserved left ventricular function, NYHA class 2 (HCC)   Paroxysmal atrial fibrillation (HCC)   Elevated troponin   Malnutrition of moderate degree    ASSESSMENT AND PLAN:  Vanessa [name] is a 89 y.o. female  with a past medical history of chronic combined systolic and diastolic heart failure, paroxysmal atrial fibrillation, Hx SVT, chronic hypotension on midodrine, hyperlipidemia, COPD, type 2 diabetes mellitus who presented to the ED on 08/04/2023 following an unwitnessed fall that resulted in a fracture of the left femoral neck requiring surgery. In the setting of left hip pain patient went into atrial fibrillation, this is not new for  the patient. Cardiology was consulted for further evaluation.  # Left femur fracture s/p ORIF (04/13) # Frequent falls # Paroxysmal atrial fibrillation # Chronic hypotension (Midodrine at home) # Chronic combined systolic and diastolic heart failure (EF 45-50%) Patient reports to the ED after an unwitnessed fall brought in by EMS.  Patient reports she does not remember falling or having a procedure. BNP mildy elevated 320.  Troponins mildly elevated and flat 65 > 56. Initially in NSR on EKG but in the setting of left hip pain patient went into atrial fibrillation with RVR rates 120s to 130s.  Patient was given 1 dose of IV metoprolol 2.5 mg for rate control.  S/p left femoral fixation patient developed hypotension and was placed on phenylephrine gtt.  At 0640 yesterday phenylephrine was stopped and patient resumed on home midodrine.  HR improved today. -S/p IV digoxin with dig level of 1.6 08/06/23. Continue PO digoxin 0.125 mg daily as renal function today is normal, GFR >60. Continue home metoprolol 12.5 mg daily. -Continue aspirin 81 mg daily. Defer use of DOAC due to high risk of bleeding/frequent falls, downtrending Hgb. -DC midodrine as BP elevated. -Resume home jardiance 10 mg daily.  Cardiology will sign off. Please haiku with questions or re-engage if needed.    This patient's plan of care was discussed and created with Dr. Melton Alar and she is in agreement.  Signed: Gale Journey, PA-C  08/07/2023, 9:15 AM [facility]

## 2023-08-07 NOTE — Discharge Summary (Signed)
 Physician Discharge Summary  Vanessa [name] WUJ:[id number] DOB: 17-Feb-1935 DOA: 08/04/2023  PCP: Earnestine Mealing, MD  Admit date: 08/04/2023 Discharge date: 08/07/2023  Admitted From: home Disposition:  SNF  Recommendations for Outpatient Follow-up:  Follow up with PCP in 1-2 weeks F/u w/ ortho, Dr. Hyacinth Meeker, in 1-2 weeks F/u w/ cardio, Dr. Juliann Pares, in 1-2 weeks F/u vasc surg, NP Pace, in 2-3 weeks    Home Health: no  Equipment/Devices  Discharge Condition: stable  CODE STATUS:DNR Diet recommendation: Dysphagia III diet   Brief/Interim Summary: HPI was taken from Dr. Huel Cote: Vanessa [name] is a 89 y.o. female with medical history significant of Chronic systolic and diastolic CHF, COPD, SVT, type 2 diabetes, hypothyroidism, atrial fibrillation not on AC, chronic hypotension on midodrine, who presents to the ED due to unwitnessed fall.   Vanessa [name] states she cannot recall exactly what happened with the fall.  Per chart review, she was sitting in a recliner and tried to get up without help and fell forward landing on her left side.  Due to this, EMS was called.  At this time, Vanessa [name] endorses left hip pain but otherwise denies chest pain, shortness of breath, dizziness, headache, nausea, vomiting.   ED course: On arrival to the ED, patient was normotensive at 115/73 with heart rate of 120.  She was saturating at 94% on room air.  She was afebrile.  Initial workup notable for unremarkable CBC, creatinine 1.23, GFR 43, and troponin of 65.  Chest x-ray with no active disease.  Hip x-ray with intertrochanteric fracture of the left femoral neck, in addition to lucency in the left inferior pubic ramus.  Orthopedic surgery consulted and [facility] contacted for admission.  Discharge Diagnoses:  Principal Problem:   Femur fracture (HCC) Active Problems:   Paroxysmal atrial fibrillation (HCC)   AKI (acute kidney injury) (HCC)   Elevated troponin   Diastolic CHF with preserved left  ventricular function, NYHA class 2 (HCC)   Hypothyroid   Diabetes mellitus with hyperglycemia (HCC)   Hypotension   Chronic obstructive pulmonary disease (HCC)   Dementia (HCC)   Protein-calorie malnutrition, unspecified severity (HCC)   Malnutrition of moderate degree  As per Dr. Ashok Pall:   Left intertrochanteric femur fracture # Fall 2/2 fall, pod #2. Broke right hip October of last year. Ct head nothing acute - hgb 8s, no signs bleeding, monitor - lovenox - PT/OT - pain control - bowel regimen (BM today) - CT head as fall was unwitnessed and patient is baseline demented   # PAD ABIs show mild disease, vascular ([facility] Pace) has reviewed, nothing acute, can f/u outpt   # a-fib with RVR Required pressors overnight after surgery now weaned off and hemodynamically stable - metop resumed per cardiology - on digoxin per cardiology - anticoagulation deferred by cardiology given bleed risk   # AKI # Elevated lactate Resolved  # Dementia Without behavioral disturbance. Resides at [facility] - home memantine   # Chronic hypotension: d/c as per cardio as BP is elevated    # ILD 2/2 recurrent aspiration pneumonia but not currently present - dysphagia 3 diet, slp swallow eval - monitor   # T2DM With hyperglycemia, A1c 8s earlier this year    # Hypothyroid - home levothyroxine   # HFpEF Does not appear exacerbated - home prn torsemide and jardiance  Discharge Instructions  Discharge Instructions     Diet general   Complete by: As directed    Dysphagia III diet  Discharge instructions   Complete by: As directed    F/u w/ PCP in 1-2 weeks. F/u w/ cardio, Dr. Juliann Pares, in 1-2 weeks. F/u w/ ortho surg, Dr. Hyacinth Meeker, in 1-2 weeks. Holding DVT prophylaxis at the request of cardiology due to high risk of bleeding and downtrending hemoglobin as per ortho surg   Increase activity slowly   Complete by: As directed       Allergies as of 08/07/2023       Reactions    Codeine    Doesn't remember reaction   Cortisone    Doesn't remember reaction   Decongestant [pseudoephedrine Hcl] Other (See Comments)   Avoid decongestants due to glaucoma hx.     Donepezil    Prolonged Qt   Naproxen    Stomach issues   Simvastatin    aches   Statins Other (See Comments)   Aches         Medication List     STOP taking these medications    midodrine 10 MG tablet Commonly known as: PROAMATINE       TAKE these medications    acetaminophen 325 MG tablet Commonly known as: TYLENOL Take 1-2 tablets (325-650 mg total) by mouth every 6 (six) hours as needed for mild pain (pain score 1-3) (pain score 1-3 or temp > 100.5).   aspirin EC 81 MG tablet Take 81 mg by mouth daily.   digoxin 0.125 MG tablet Commonly known as: LANOXIN Take 1 tablet (0.125 mg total) by mouth daily. Start taking on: August 08, 2023   empagliflozin 10 MG Tabs tablet Commonly known as: JARDIANCE Take 10 mg by mouth daily.   feeding supplement Liqd Take 237 mLs by mouth 2 (two) times daily between meals.   HYDROcodone-acetaminophen 5-325 MG tablet Commonly known as: NORCO/VICODIN Take 1-2 tablets by mouth every 6 (six) hours as needed for up to 1 day for moderate pain (pain score 4-6).   levothyroxine 25 MCG tablet Commonly known as: SYNTHROID TAKE 1 TABLET BY MOUTH ONCE DAILY ON AN EMPTY STOMACH. WAIT 30 MINUTES BEFORE TAKING OTHER MEDS.   melatonin 5 MG Tabs Take 2.5 mg by mouth at bedtime as needed.   memantine 5 MG tablet Commonly known as: NAMENDA Take 5 mg by mouth 2 (two) times daily.   metoprolol succinate 25 MG 24 hr tablet Commonly known as: TOPROL-XL Take 12.5 mg by mouth daily.   pantoprazole 20 MG tablet Commonly known as: PROTONIX Take 20 mg by mouth daily.   polyethylene glycol 17 g packet Commonly known as: MIRALAX / GLYCOLAX Take 17 g by mouth daily.   Potassium 99 MG Tabs Take 2 tablets (198 mg total) by mouth daily.   senna-docusate 8.6-50  MG tablet Commonly known as: Senokot-S Take 1 tablet by mouth 2 (two) times daily as needed for mild constipation.   torsemide 10 MG tablet Commonly known as: DEMADEX Take 10 mg by mouth daily.   Triple Paste 40 % Pste Generic drug: Zinc Oxide Apply 1 Application topically as needed (to buttocks and sacrum).   Vitamin D 50 MCG (2000 UT) Caps Take 1 capsule (2,000 Units total) by mouth daily.        Follow-up Information     Alwyn Pea, MD. Go in 1 week(s).   Specialties: Cardiology, Internal Medicine Contact information: [address] [place] [address] [phone]         Deeann Saint, MD. Schedule an appointment as soon as possible for a visit  in 2 week(s).   Specialty: Orthopedic Surgery Why: For suture removal, For wound re-check, For re-evaluation  Please make appointment for the patient prior to her discharge from my office 2 weeks after discharge. Contact information: [address] [place] [address] [phone]                Allergies  Allergen Reactions   Codeine     Doesn't remember reaction   Cortisone     Doesn't remember reaction   Decongestant [Pseudoephedrine Hcl] Other (See Comments)    Avoid decongestants due to glaucoma hx.     Donepezil     Prolonged Qt   Naproxen     Stomach issues   Simvastatin     aches   Statins Other (See Comments)    Aches     Consultations: Cardio Ortho surg    Procedures/Studies: CT HEAD WO CONTRAST ( ) Result Date: 08/05/2023 CLINICAL DATA:  unwitnessed fall EXAM: CT HEAD WITHOUT CONTRAST TECHNIQUE: Contiguous axial images were obtained from the base of the skull through the vertex without intravenous contrast. RADIATION DOSE REDUCTION: This exam was performed according to the departmental dose-optimization program which includes automated exposure control, adjustment of the mA and/or kV according to patient size and/or use of iterative reconstruction technique.  COMPARISON:  CT head February 07, 2023. FINDINGS: Brain: No evidence of acute infarction, hemorrhage, hydrocephalus, extra-axial collection or mass lesion/mass effect. Patchy white matter hypodensities, nonspecific but compatible with chronic microvascular disease. Vascular: No hyperdense vessel or unexpected calcification. Skull: No acute fracture. Sinuses/Orbits: Clear sinuses.  No acute orbital findings. Other: No mastoid effusions. IMPRESSION: No evidence of acute intracranial abnormality. Electronically Signed   By: Feliberto Harts M.D.   On: 08/05/2023 19:44   US ARTERIAL ABI (SCREENING LOWER EXTREMITY) Result Date: 08/05/2023 CLINICAL DATA:  Absent pulses in the lower extremities. EXAM: NONINVASIVE PHYSIOLOGIC VASCULAR STUDY OF BILATERAL LOWER EXTREMITIES TECHNIQUE: Evaluation of both lower extremities were performed at rest, including calculation of ankle-brachial indices with single level pressure measurements and doppler recording. COMPARISON:  None available. FINDINGS: Right ABI:  0.93 Left ABI:  1.30 Right Lower Extremity: Posterior tibial and dorsalis pedis artery waveforms are monophasic. Left Lower Extremity: Posterior tibial and dorsalis pedis artery waveforms are monophasic. 0.9-0.99 Borderline PAD IMPRESSION: At least borderline lower extremity peripheral arterial disease in the right lower extremity. The degree of stenosis may be more significant given abnormal bilateral waveforms. Consider further evaluation with CT angiography lower extremities. Electronically Signed   By: Acquanetta Belling M.D.   On: 08/05/2023 11:26   DG HIP UNILAT WITH PELVIS 2-3 VIEWS LEFT Result Date: 08/04/2023 CLINICAL DATA:  Left intramedullary nail EXAM: DG HIP (WITH OR WITHOUT PELVIS) 2-3V LEFT COMPARISON:  Left hip x-ray 08/04/2023. FINDINGS: Three intraoperative fluoroscopic views of the left hip. Left femoral intramedullary nail and hip screw replaced. Alignment is anatomic as can be seen. IMPRESSION: Left femoral  intramedullary nail and hip screw replaced. Electronically Signed   By: Darliss Cheney M.D.   On: 08/04/2023 20:27   DG C-Arm 1-60 Min-No Report Result Date: 08/04/2023 Fluoroscopy was utilized by the requesting physician.  No radiographic interpretation.   DG C-Arm 1-60 Min-No Report Result Date: 08/04/2023 Fluoroscopy was utilized by the requesting physician.  No radiographic interpretation.   DG Chest Portable 1 View Result Date: 08/04/2023 CLINICAL DATA:  Pain, LEFT hip fall. EXAM: PORTABLE CHEST 1 VIEW COMPARISON:  Radiograph 02/09/2023 FINDINGS: Large hiatal hernia posterior to the heart. Entire stomach is  above the hemidiaphragm. No pulmonary edema or pleural fluid. No pneumothorax. No acute osseous abnormality. IMPRESSION: 1. No acute cardiopulmonary process. 2. Large hiatal hernia. Electronically Signed   By: Genevive Bi M.D.   On: 08/04/2023 13:11   DG Hip Unilat With Pelvis 2-3 Views Left Result Date: 08/04/2023 CLINICAL DATA:  Fall, LEFT hip pain EXAM: DG HIP (WITH OR WITHOUT PELVIS) 2-3V LEFT COMPARISON:  None Available. FINDINGS: Intertrochanteric fracture of the LEFT femoral neck. Mild caudad migration of the femur. No angulation. Avulsion of the lesser trochanter. Lucency in the LEFT inferior pubic ramus. Potential aggressive osseous lesion. Internal fixation of the RIGHT hip. IMPRESSION: Intertrochanteric fracture of the LEFT femoral neck. Lucency in the LEFT inferior pubic ramus. Potential aggressive osseous lesion. Electronically Signed   By: Genevive Bi M.D.   On: 08/04/2023 13:09   (Echo, Carotid, EGD, Colonoscopy, ERCP)    Subjective: Pt c/o fatigue    Discharge Exam: Vitals:   08/07/23 0755 08/07/23 0835  BP: (!) 153/70   Pulse: (!) 44 69  Resp: 16   Temp: (!) 97.5 F (36.4 C)   SpO2: 100%    Vitals:   08/07/23 0310 08/07/23 0500 08/07/23 0755 08/07/23 0835  BP: (!) 138/55  (!) 153/70   Pulse: 62  (!) 44 69  Resp: 18  16   Temp: 98.4 F (36.9 C)   (!) 97.5 F (36.4 C)   TempSrc:   Oral   SpO2: 96%  100%   Weight:  63.4 kg    Height:        General: Pt is alert, awake, not in acute distress Cardiovascular:S1/S2 +, no rubs, no gallops Respiratory: CTA bilaterally, no wheezing, no rhonchi Abdominal: Soft, NT, ND, bowel sounds + Extremities: no cyanosis    The results of significant diagnostics from this hospitalization (including imaging, microbiology, ancillary and laboratory) are listed below for reference.     Microbiology: Recent Results (from the past 240 hours)  MRSA Next Gen by PCR, Nasal     Status: None   Collection Time: 08/04/23 10:13 PM   Specimen: Nasal Mucosa; Nasal Swab  Result Value Ref Range Status   MRSA by PCR Next Gen NOT DETECTED NOT DETECTED Final    Comment: (NOTE) The GeneXpert MRSA Assay (FDA approved for NASAL specimens only), is one component of a comprehensive MRSA colonization surveillance program. It is not intended to diagnose MRSA infection nor to guide or monitor treatment for MRSA infections. Test performance is not FDA approved in patients less than 77 years old. Performed at [facility], [address] [place], [place] [address]   Culture, blood (Routine X 2) w Reflex to ID Panel     Status: None (Preliminary result)   Collection Time: 08/05/23  3:02 AM   Specimen: BLOOD  Result Value Ref Range Status   Specimen Description BLOOD BLOOD LEFT HAND low volume hard stick  Final   Special Requests   Final    BOTTLES DRAWN AEROBIC ONLY Blood Culture results may not be optimal due to an inadequate volume of blood received in culture bottles   Culture   Final    NO GROWTH 2 DAYS Performed at [facility], [address], [place], [place] [address]    Report Status PENDING  Incomplete  Culture, blood (Routine X 2) w Reflex to ID Panel     Status: None (Preliminary result)   Collection Time: 08/05/23  4:35 AM   Specimen: BLOOD LEFT HAND  Result Value Ref Range  Status   Specimen Description BLOOD LEFT HAND  Final   Special Requests   Final    BOTTLES DRAWN AEROBIC ONLY Blood Culture results may not be optimal due to an inadequate volume of blood received in culture bottles   Culture   Final    NO GROWTH 2 DAYS Performed at [facility], [address] [place], [place] [address]    Report Status PENDING  Incomplete     Labs: BNP (last 3 results) Recent Labs    02/09/23 0118 02/11/23 0352 08/04/23 2214  BNP 954.4* 318.0* 320.6*   Basic Metabolic Panel: Recent Labs  Lab 08/04/23 1312 08/04/23 2214 08/05/23 0435 08/06/23 0434 08/07/23 0322  NA 138 137 139 138 135  K 4.0 4.5 4.3 3.9 4.1  CL 98 99 105 106 106  CO2 28 24 22 23 24   GLUCOSE 230* 289* 234* 86 222*  BUN 21 24* 24* 26* 22  CREATININE 1.23* 1.47* 1.28* 0.90 0.83  CALCIUM 9.2 8.5* 8.1* 8.3* 8.6*   Liver Function Tests: No results for input(s): "AST", "ALT", "ALKPHOS", "BILITOT", "PROT", "ALBUMIN" in the last 168 hours. No results for input(s): "LIPASE", "AMYLASE" in the last 168 hours. No results for input(s): "AMMONIA" in the last 168 hours. CBC: Recent Labs  Lab 08/04/23 1312 08/04/23 2214 08/05/23 0435 08/06/23 0434 08/07/23 0322  WBC 8.8 19.0* 16.0* 11.7* 10.0  HGB 14.2 11.8* 10.6* 8.8* 8.3*  HCT 44.7 37.5 34.4* 27.6* 26.1*  MCV 90.3 92.1 91.0 89.3 89.7  PLT 276 261 210 177 200   Cardiac Enzymes: No results for input(s): "CKTOTAL", "CKMB", "CKMBINDEX", "TROPONINI" in the last 168 hours. BNP: Invalid input(s): "POCBNP" CBG: Recent Labs  Lab 08/06/23 0743 08/06/23 1137 08/06/23 1635 08/06/23 2137 08/07/23 0752  GLUCAP 101* 211* 181* 136* 129*   D-Dimer No results for input(s): "DDIMER" in the last 72 hours. Hgb A1c No results for input(s): "HGBA1C" in the last 72 hours. Lipid Profile No results for input(s): "CHOL", "HDL", "LDLCALC", "TRIG", "CHOLHDL", "LDLDIRECT" in the last 72 hours. Thyroid function studies Recent Labs     08/05/23 0435  TSH 2.176   Anemia work up No results for input(s): "VITAMINB12", "FOLATE", "FERRITIN", "TIBC", "IRON", "RETICCTPCT" in the last 72 hours. Urinalysis    Component Value Date/Time   COLORURINE YELLOW (A) 08/04/2023 2344   APPEARANCEUR HAZY (A) 08/04/2023 2344   LABSPEC 1.017 08/04/2023 2344   PHURINE 5.0 08/04/2023 2344   GLUCOSEU >=500 (A) 08/04/2023 2344   GLUCOSEU NEGATIVE 11/26/2022 1327   HGBUR NEGATIVE 08/04/2023 2344   BILIRUBINUR NEGATIVE 08/04/2023 2344   BILIRUBINUR negative 11/07/2022 1339   BILIRUBINUR neg 02/27/2022 1508   KETONESUR NEGATIVE 08/04/2023 2344   PROTEINUR NEGATIVE 08/04/2023 2344   UROBILINOGEN 0.2 11/26/2022 1327   NITRITE NEGATIVE 08/04/2023 2344   LEUKOCYTESUR NEGATIVE 08/04/2023 2344   Sepsis Labs Recent Labs  Lab 08/04/23 2214 08/05/23 0435 08/06/23 0434 08/07/23 0322  WBC 19.0* 16.0* 11.7* 10.0   Microbiology Recent Results (from the past 240 hours)  MRSA Next Gen by PCR, Nasal     Status: None   Collection Time: 08/04/23 10:13 PM   Specimen: Nasal Mucosa; Nasal Swab  Result Value Ref Range Status   MRSA by PCR Next Gen NOT DETECTED NOT DETECTED Final    Comment: (NOTE) The GeneXpert MRSA Assay (FDA approved for NASAL specimens only), is one component of a comprehensive MRSA colonization surveillance program. It is not intended to diagnose MRSA infection  nor to guide or monitor treatment for MRSA infections. Test performance is not FDA approved in patients less than 21 years old. Performed at [facility], [address] [place], [place] [address]   Culture, blood (Routine X 2) w Reflex to ID Panel     Status: None (Preliminary result)   Collection Time: 08/05/23  3:02 AM   Specimen: BLOOD  Result Value Ref Range Status   Specimen Description BLOOD BLOOD LEFT HAND low volume hard stick  Final   Special Requests   Final    BOTTLES DRAWN AEROBIC ONLY Blood Culture results may not be optimal due to an  inadequate volume of blood received in culture bottles   Culture   Final    NO GROWTH 2 DAYS Performed at [facility], [address], [place], [place] [address]    Report Status PENDING  Incomplete  Culture, blood (Routine X 2) w Reflex to ID Panel     Status: None (Preliminary result)   Collection Time: 08/05/23  4:35 AM   Specimen: BLOOD LEFT HAND  Result Value Ref Range Status   Specimen Description BLOOD LEFT HAND  Final   Special Requests   Final    BOTTLES DRAWN AEROBIC ONLY Blood Culture results may not be optimal due to an inadequate volume of blood received in culture bottles   Culture   Final    NO GROWTH 2 DAYS Performed at [facility], [address], [place], [place] [address]    Report Status PENDING  Incomplete     Time coordinating discharge: Over 30 minutes  SIGNED:   Alphonsus Jeans, MD  [facility] 08/07/2023, 11:49 AM Pager   If 7PM-7AM, please contact night-coverage [email or web address]

## 2023-08-08 ENCOUNTER — Non-Acute Institutional Stay (SKILLED_NURSING_FACILITY): Payer: Self-pay | Admitting: Nurse Practitioner

## 2023-08-08 ENCOUNTER — Encounter: Payer: Self-pay | Admitting: Nurse Practitioner

## 2023-08-08 DIAGNOSIS — S72002D Fracture of unspecified part of neck of left femur, subsequent encounter for closed fracture with routine healing: Secondary | ICD-10-CM

## 2023-08-08 DIAGNOSIS — D62 Acute posthemorrhagic anemia: Secondary | ICD-10-CM

## 2023-08-08 DIAGNOSIS — F0390 Unspecified dementia without behavioral disturbance: Secondary | ICD-10-CM | POA: Diagnosis not present

## 2023-08-08 DIAGNOSIS — R4182 Altered mental status, unspecified: Secondary | ICD-10-CM | POA: Diagnosis not present

## 2023-08-08 DIAGNOSIS — Z7189 Other specified counseling: Secondary | ICD-10-CM

## 2023-08-08 DIAGNOSIS — R0989 Other specified symptoms and signs involving the circulatory and respiratory systems: Secondary | ICD-10-CM | POA: Diagnosis not present

## 2023-08-08 NOTE — Progress Notes (Signed)
 Location:  Other [place].  Nursing Home Room Number: [facility] [address] Place of Service:  SNF [phone]) [name], NP  PCP: Earnestine Mealing, MD  Patient Care Team: Earnestine Mealing, MD as PCP - General (Family Medicine) Lockie Mola, MD as Referring Physician (Ophthalmology) Antonieta Iba, MD as Consulting Physician (Cardiology)  Extended Emergency Contact Information Primary Emergency Contact: Marlou Starks of [place] Home Phone: [phone] Relation: Daughter Secondary Emergency Contact: Olson,[name] Mobile Phone: [phone] Relation: Son  Goals of care: Advanced Directive information    08/05/2023   12:45 AM  Advanced Directives  Does Patient Have a Medical Advance Directive? Yes  Type of Advance Directive Out of facility DNR (pink MOST or yellow form)     Chief Complaint  Patient presents with   Decline    Decline    HPI:  Pt is a 89 y.o. female seen today for an acute visit.  The patient presents with altered mental status and post-operative concerns following hip surgery. Pt is at [facility] for long term care.  She experienced a fall on Sunday, leading to a hospital visit and subsequent hip surgery on the same day. Per daughters,  Prior to the surgery, her heart rate was elevated, running in the 130s, raising concerns about her heart rate and blood pressure. Post-surgery, she was placed in the ICU, and her heart rate remained elevated for the following days.  cardiology assessed her as being back in rhythm and cleared her for discharge 2 days ago.   Following the surgery, her hemoglobin levels trended down  8.3. There was a discussion about transfusion but she did not get one.   On 3 days ago, she was awake, alert, and eating, but 2 days ago, she began showing signs of delirium. By yesterday, her condition had declined further, with slurred speech and difficulty finding words. Her family noted that she had not been sleeping  well, and she was fidgety, pulling out her IVs.    Noted to have increase drainage from her surgical site, which was noted to be more than expected. She has had two bowel movements since surgery and is not experiencing discomfort with urination. She has only taken Tylenol once or twice for pain since the surgery. She does not report pain but winces when being touched.  She is able to follow commands but very lethargic at this time.     Past Medical History:  Diagnosis Date   Anemia    treated ~2009 with iron, resolved   Arthritis    Cataract    surgery on R catarct 2012   Chronic systolic CHF (congestive heart failure) (HCC)    a. TTE 2012: EF of 45-50%, mild diffuse HK, trivial AI, mild MR, mildly dilated RV with mild reduction of RVSF, mild to moderate TR, mild to moderate PR, mildly elevated PASP   COPD (chronic obstructive pulmonary disease) (HCC)    Family history of adverse reaction to anesthesia    Mother - altered mental status (long term)   Glaucoma    History of pelvic fracture    Hyperglycemia    mild inc in fasting sugar   Hyperlipidemia    Hypothyroidism    Insomnia    Migraines    without aura, sx since age 66   Osteopenia    prev with 10 years of fosamax and intolerant of evista   PAF (paroxysmal atrial fibrillation) (HCC)    a. initially noted 2012; b. CHADS2VASc at least 5 (CHF,  age x 2, vasacular disease, female)   Pulmonary hypertension (HCC)    Wears dentures    full upper and lower   Past Surgical History:  Procedure Laterality Date   CATARACT EXTRACTION     CATARACT EXTRACTION W/PHACO Left 08/27/2016   Procedure: CATARACT EXTRACTION PHACO AND INTRAOCULAR LENS PLACEMENT (IOC)  Left;  Surgeon: Lockie Mola, MD;  Location: [facility];  Service: Ophthalmology;  Laterality: Left;   COLONOSCOPY  2012   INTRAMEDULLARY (IM) NAIL INTERTROCHANTERIC Right 02/07/2023   Procedure: INTRAMEDULLARY (IM) NAIL INTERTROCHANTERIC;  Surgeon: Christena Flake,  MD;  Location: [facility];  Service: Orthopedics;  Laterality: Right;   INTRAMEDULLARY (IM) NAIL INTERTROCHANTERIC Left 08/04/2023   Procedure: FIXATION, FRACTURE, INTERTROCHANTERIC, WITH INTRAMEDULLARY ROD;  Surgeon: Deeann Saint, MD;  Location: [facility];  Service: Orthopedics;  Laterality: Left;   REFRACTIVE SURGERY      Allergies  Allergen Reactions   Codeine     Doesn't remember reaction   Cortisone     Doesn't remember reaction   Decongestant [Pseudoephedrine Hcl] Other (See Comments)    Avoid decongestants due to glaucoma hx.     Donepezil     Prolonged Qt   Naproxen     Stomach issues   Simvastatin     aches   Statins Other (See Comments)    Aches     Outpatient Encounter Medications as of 08/08/2023  Medication Sig   acetaminophen (TYLENOL) 325 MG tablet Take 1-2 tablets (325-650 mg total) by mouth every 6 (six) hours as needed for mild pain (pain score 1-3) (pain score 1-3 or temp > 100.5).   aspirin EC 81 MG tablet Take 81 mg by mouth daily.   Cholecalciferol (VITAMIN D) 50 MCG (2000 UT) CAPS Take 1 capsule (2,000 Units total) by mouth daily.   digoxin (LANOXIN) 0.125 MG tablet Take 1 tablet (0.125 mg total) by mouth daily.   empagliflozin (JARDIANCE) 10 MG TABS tablet Take 10 mg by mouth daily.   HYDROcodone-acetaminophen (NORCO/VICODIN) 5-325 MG tablet Take 1-2 tablets by mouth every 6 (six) hours as needed for up to 1 day for moderate pain (pain score 4-6). (Patient taking differently: Take 1 tablet by mouth every 6 (six) hours as needed for moderate pain (pain score 4-6).)   lactose free nutrition (BOOST) LIQD Take 237 mLs by mouth 2 (two) times daily.   levothyroxine (SYNTHROID) 25 MCG tablet TAKE 1 TABLET BY MOUTH ONCE DAILY ON AN EMPTY STOMACH. WAIT 30 MINUTES BEFORE TAKING OTHER MEDS.   melatonin 5 MG TABS Take 5 mg by mouth at bedtime as needed.   memantine (NAMENDA) 5 MG tablet Take 5 mg by mouth 2 (two) times daily.   metoprolol succinate (TOPROL-XL) 25 MG 24 hr  tablet Take 12.5 mg by mouth daily.   midodrine (PROAMATINE) 10 MG tablet Take 10 mg by mouth 3 (three) times daily.   OXYGEN Inhale into the lungs. 2lpm as needed.   pantoprazole (PROTONIX) 20 MG tablet Take 20 mg by mouth daily.   polyethylene glycol (MIRALAX / GLYCOLAX) 17 g packet Take 17 g by mouth daily.   senna-docusate (SENOKOT-S) 8.6-50 MG tablet Take 2 tablets by mouth 2 (two) times daily.   torsemide (DEMADEX) 10 MG tablet Take 10 mg by mouth daily.   traZODone (DESYREL) 50 MG tablet Take 50 mg by mouth at bedtime as needed for sleep.   Zinc Oxide (TRIPLE PASTE) 40 % PSTE Apply 1 Application topically as needed (to buttocks and sacrum).  feeding supplement (ENSURE ENLIVE / ENSURE PLUS) LIQD Take 237 mLs by mouth 2 (two) times daily between meals. (Patient not taking: Reported on 08/07/2023)   Potassium 99 MG TABS Take 2 tablets (198 mg total) by mouth daily. (Patient not taking: Reported on 08/08/2023)   No facility-administered encounter medications on file as of 08/08/2023.    Review of Systems  Unable to perform ROS: Other    Immunization History  Administered Date(s) Administered   Influenza Inj Mdck Quad Pf 01/28/2019   Influenza Split 01/25/2011, 01/24/2012, 01/21/2013   Influenza, High Dose Seasonal PF 03/15/2021, 01/08/2022, 03/07/2023   Influenza,inj,Quad PF,6+ Mos 01/14/2017   Influenza-Unspecified 01/20/2014, 01/19/2016, 01/17/2018, 01/22/2020   Moderna Sars-Covid-2 Vaccination 05/06/2019, 06/03/2019, 03/07/2020   PNEUMOCOCCAL CONJUGATE-20 03/11/2023   Pneumococcal Conjugate-13 05/24/2014   Pneumococcal Polysaccharide-23 05/14/2011   Respiratory Syncytial Virus Vaccine,Recomb Aduvanted(Arexvy) 03/11/2023   Tdap 01/07/2012, 02/07/2023   Pertinent  Health Maintenance Due  Topic Date Due   OPHTHALMOLOGY EXAM  Never done   INFLUENZA VACCINE  11/22/2023   HEMOGLOBIN A1C  01/11/2024   FOOT EXAM  07/15/2024   DEXA SCAN  Completed      02/15/2022    1:21 PM  05/08/2022   12:30 PM 06/13/2022    3:40 PM 08/10/2022    3:02 PM 07/16/2023    1:13 PM  Fall Risk  Falls in the past year?  1 0 1 1  Was there an injury with Fall?  0 0 1 1  Fall Risk Category Calculator  2 0 3 2  (RETIRED) Patient Fall Risk Level Low fall risk      Patient at Risk for Falls Due to  History of fall(s);Impaired balance/gait No Fall Risks Impaired balance/gait;Impaired mobility;History of fall(s) History of fall(s)  Fall risk Follow up  Falls evaluation completed Education provided;Falls prevention discussed Falls evaluation completed;Falls prevention discussed    Functional Status Survey:    Vitals:   08/08/23 0911  BP: (!) 142/76  Pulse: 80  Resp: 20  Temp: 97.9 F (36.6 C)  SpO2: 95%  Weight: 119 lb 3.2 oz (54.1 kg)  Height: 5\' 4"  (1.626 m)   Body mass index is 20.46 kg/m. Physical Exam Constitutional:      General: She is not in acute distress.    Appearance: She is well-developed. She is not diaphoretic.  HENT:     Head: Normocephalic and atraumatic.     Mouth/Throat:     Pharynx: No oropharyngeal exudate.  Eyes:     Conjunctiva/sclera: Conjunctivae normal.     Pupils: Pupils are equal, round, and reactive to light.  Cardiovascular:     Rate and Rhythm: Normal rate and regular rhythm.     Heart sounds: Normal heart sounds.  Pulmonary:     Effort: Pulmonary effort is normal.     Breath sounds: Rhonchi (in bases) present.  Abdominal:     General: Bowel sounds are normal.     Palpations: Abdomen is soft.  Musculoskeletal:     Cervical back: Normal range of motion and neck supple.     Right lower leg: No edema.     Left lower leg: No edema.  Skin:    General: Skin is warm and dry.  Neurological:     Mental Status: She is lethargic.     Motor: Weakness present.     Gait: Gait abnormal.  Psychiatric:        Mood and Affect: Mood normal.    Labs reviewed: Recent Labs  02/08/23 0455 02/09/23 0631 02/10/23 8119 02/11/23 0352  02/12/23 0349 02/13/23 0932 08/05/23 0435 08/06/23 0434 08/07/23 0322  NA 140 137   < > 140 138   < > 139 138 135  K 4.2 4.6   < > 3.4* 3.8   < > 4.3 3.9 4.1  CL 110 108   < > 104 102   < > 105 106 106  CO2 22 22   < > 27 27   < > 22 23 24   GLUCOSE 96 149*   < > 86 99   < > 234* 86 222*  BUN 21 27*   < > 18 16   < > 24* 26* 22  CREATININE 1.08* 1.19*   < > 0.60 0.56   < > 1.28* 0.90 0.83  CALCIUM 8.2* 8.3*   < > 8.0* 8.2*   < > 8.1* 8.3* 8.6*  MG 1.7 2.3  --  1.9 2.0  --   --   --   --   PHOS 4.1 4.4  --   --  2.8  --   --   --   --    < > = values in this interval not displayed.   Recent Labs    02/07/23 0814 02/10/23 0656 07/11/23 0000  AST 37 47* 14  ALT 17 7 5*  ALKPHOS 37* 37* 73  BILITOT 0.7 0.9  --   PROT 5.0* 4.9*  --   ALBUMIN 2.8* 2.6* 3.7   Recent Labs    02/07/23 0814 02/07/23 1424 02/21/23 0000 04/11/23 0000 07/11/23 0000 08/05/23 0435 08/06/23 0434 08/07/23 0322  WBC 20.2*   < > 9.0 10.6   < > 16.0* 11.7* 10.0  NEUTROABS 17.1*  --  6,642.00 8,226.00  --   --   --   --   HGB 10.0*   < > 10.8* 13.2   < > 10.6* 8.8* 8.3*  HCT 31.1*   < > 34* 40   < > 34.4* 27.6* 26.1*  MCV 93.4   < >  --   --    < > 91.0 89.3 89.7  PLT 208   < > 526* 334   < > 210 177 200   < > = values in this interval not displayed.   Lab Results  Component Value Date   TSH 2.176 08/05/2023   Lab Results  Component Value Date   HGBA1C 8.3 07/11/2023   Lab Results  Component Value Date   CHOL 226 (H) 05/08/2022   HDL 62.30 05/08/2022   LDLCALC 137 (H) 05/08/2022   TRIG 133.0 05/08/2022   CHOLHDL 4 05/08/2022    Significant Diagnostic Results in last 30 days:  CT HEAD WO CONTRAST ( ) Result Date: 08/05/2023 CLINICAL DATA:  unwitnessed fall EXAM: CT HEAD WITHOUT CONTRAST TECHNIQUE: Contiguous axial images were obtained from the base of the skull through the vertex without intravenous contrast. RADIATION DOSE REDUCTION: This exam was performed according to the departmental  dose-optimization program which includes automated exposure control, adjustment of the mA and/or kV according to patient size and/or use of iterative reconstruction technique. COMPARISON:  CT head February 07, 2023. FINDINGS: Brain: No evidence of acute infarction, hemorrhage, hydrocephalus, extra-axial collection or mass lesion/mass effect. Patchy white matter hypodensities, nonspecific but compatible with chronic microvascular disease. Vascular: No hyperdense vessel or unexpected calcification. Skull: No acute fracture. Sinuses/Orbits: Clear sinuses.  No acute orbital findings. Other: No mastoid effusions. IMPRESSION: No evidence of acute intracranial  abnormality. Electronically Signed   By: Feliberto Harts M.D.   On: 08/05/2023 19:44   US ARTERIAL ABI (SCREENING LOWER EXTREMITY) Result Date: 08/05/2023 CLINICAL DATA:  Absent pulses in the lower extremities. EXAM: NONINVASIVE PHYSIOLOGIC VASCULAR STUDY OF BILATERAL LOWER EXTREMITIES TECHNIQUE: Evaluation of both lower extremities were performed at rest, including calculation of ankle-brachial indices with single level pressure measurements and doppler recording. COMPARISON:  None available. FINDINGS: Right ABI:  0.93 Left ABI:  1.30 Right Lower Extremity: Posterior tibial and dorsalis pedis artery waveforms are monophasic. Left Lower Extremity: Posterior tibial and dorsalis pedis artery waveforms are monophasic. 0.9-0.99 Borderline PAD IMPRESSION: At least borderline lower extremity peripheral arterial disease in the right lower extremity. The degree of stenosis may be more significant given abnormal bilateral waveforms. Consider further evaluation with CT angiography lower extremities. Electronically Signed   By: Acquanetta Belling M.D.   On: 08/05/2023 11:26   DG HIP UNILAT WITH PELVIS 2-3 VIEWS LEFT Result Date: 08/04/2023 CLINICAL DATA:  Left intramedullary nail EXAM: DG HIP (WITH OR WITHOUT PELVIS) 2-3V LEFT COMPARISON:  Left hip x-ray 08/04/2023. FINDINGS:  Three intraoperative fluoroscopic views of the left hip. Left femoral intramedullary nail and hip screw replaced. Alignment is anatomic as can be seen. IMPRESSION: Left femoral intramedullary nail and hip screw replaced. Electronically Signed   By: Darliss Cheney M.D.   On: 08/04/2023 20:27   DG C-Arm 1-60 Min-No Report Result Date: 08/04/2023 Fluoroscopy was utilized by the requesting physician.  No radiographic interpretation.   DG C-Arm 1-60 Min-No Report Result Date: 08/04/2023 Fluoroscopy was utilized by the requesting physician.  No radiographic interpretation.   DG Chest Portable 1 View Result Date: 08/04/2023 CLINICAL DATA:  Pain, LEFT hip fall. EXAM: PORTABLE CHEST 1 VIEW COMPARISON:  Radiograph 02/09/2023 FINDINGS: Large hiatal hernia posterior to the heart. Entire stomach is above the hemidiaphragm. No pulmonary edema or pleural fluid. No pneumothorax. No acute osseous abnormality. IMPRESSION: 1. No acute cardiopulmonary process. 2. Large hiatal hernia. Electronically Signed   By: Genevive Bi M.D.   On: 08/04/2023 13:11   DG Hip Unilat With Pelvis 2-3 Views Left Result Date: 08/04/2023 CLINICAL DATA:  Fall, LEFT hip pain EXAM: DG HIP (WITH OR WITHOUT PELVIS) 2-3V LEFT COMPARISON:  None Available. FINDINGS: Intertrochanteric fracture of the LEFT femoral neck. Mild caudad migration of the femur. No angulation. Avulsion of the lesser trochanter. Lucency in the LEFT inferior pubic ramus. Potential aggressive osseous lesion. Internal fixation of the RIGHT hip. IMPRESSION: Intertrochanteric fracture of the LEFT femoral neck. Lucency in the LEFT inferior pubic ramus. Potential aggressive osseous lesion. Electronically Signed   By: Genevive Bi M.D.   On: 08/04/2023 13:09    Assessment/Plan 1. ABLA (acute blood loss anemia) (Primary) Noted to have drop in hgb during hospitalization, cbc has been order for re-evaluation  2. Dementia, unspecified dementia severity, unspecified dementia  type, unspecified whether behavioral, psychotic, or mood disturbance or anxiety (HCC) Increase in delirium during hospitalization but remained alert. Continues with supportive care   3. Altered mental status, unspecified altered mental status type Progressive decline over the last 3 days since fixation fracture intertrochanteric with IM rod.  Mental status has progressively worsen from confusion to lethargy and decrease in intake and following minimal commands.  Increase in serosanguinous drainage to incision, call placed to ortho Family does not wish to send her back to hospital for further evaluation or workup Cbc, bmp and BNP have been order. Will also get chest xray Will start rocephin  1 gm IM daily for 5 days at this time.   4. Closed fracture of left hip with routine healing, subsequent encounter -with increase in drainage IM rocephin started and orthopedic notified  -will have staff change dressing twice daily and montior  5. Advanced care planning/counseling discussion Family does not want patient going back to hospital To treat in facility at this time - Do not attempt resuscitation (DNR)    Linville Decarolis K. Denney Fisher[facility] [phone]

## 2023-08-09 ENCOUNTER — Encounter: Payer: Self-pay | Admitting: Student

## 2023-08-10 LAB — CULTURE, BLOOD (ROUTINE X 2)
Culture: NO GROWTH
Culture: NO GROWTH

## 2023-08-12 ENCOUNTER — Non-Acute Institutional Stay (SKILLED_NURSING_FACILITY): Payer: Self-pay | Admitting: Student

## 2023-08-12 DIAGNOSIS — I5030 Unspecified diastolic (congestive) heart failure: Secondary | ICD-10-CM

## 2023-08-13 ENCOUNTER — Encounter: Payer: Self-pay | Admitting: Student

## 2023-08-13 NOTE — Progress Notes (Signed)
 Location:  Other Nursing Home Room Number: [facility] [address] Place of Service:  SNF [phone]) Provider:  Alver Jobs, Lisa Rideau, MD  Patient Care Team: [name], MD as PCP - General (Family Medicine) [name], MD as Referring Physician (Ophthalmology) [name], MD as Consulting Physician (Cardiology)  Extended Emergency Contact Information Primary Emergency Contact: [name],[name]  [place] Home Phone: [phone] Relation: Daughter Secondary Emergency Contact: Reist,[name] Mobile Phone: [phone] Relation: Son  Code Status:  DNR Goals of care: Advanced Directive information    08/05/2023   12:45 AM  Advanced Directives  Does Patient Have a Medical Advance Directive? Yes  Type of Advance Directive Out of facility DNR (pink MOST or yellow form)     Chief Complaint  Patient presents with   Congestive Heart Failure    HPI:  Pt is a 89 y.o. female seen today for an acute visit for follow up. Patient recently readmitted to facility from the hospital. Labs concerning for worsened CHF and patient has been on scheduled diuretics. She is sitting up and prepared to have lunch in her wheelchair.  Past Medical History:  Diagnosis Date   Anemia    treated ~2009 with iron, resolved   Arthritis    Cataract    surgery on R catarct 2012   Chronic systolic CHF (congestive heart failure) (HCC)    a. TTE 2012: EF of 45-50%, mild diffuse HK, trivial AI, mild MR, mildly dilated RV with mild reduction of RVSF, mild to moderate TR, mild to moderate PR, mildly elevated PASP   COPD (chronic obstructive pulmonary disease) (HCC)    Family history of adverse reaction to anesthesia    Mother - altered mental status (long term)   Glaucoma    History of pelvic fracture    Hyperglycemia    mild inc in fasting sugar   Hyperlipidemia    Hypothyroidism    Insomnia    Migraines    without aura, sx since age 76   Osteopenia    prev with 10 years of  fosamax and intolerant of evista   PAF (paroxysmal atrial fibrillation) (HCC)    a. initially noted 2012; b. CHADS2VASc at least 5 (CHF, age x 2, vasacular disease, female)   Pulmonary hypertension (HCC)    Wears dentures    full upper and lower   Past Surgical History:  Procedure Laterality Date   CATARACT EXTRACTION     CATARACT EXTRACTION W/PHACO Left 08/27/2016   Procedure: CATARACT EXTRACTION PHACO AND INTRAOCULAR LENS PLACEMENT (IOC)  Left;  Surgeon: [name], MD;  Location: [facility];  Service: Ophthalmology;  Laterality: Left;   COLONOSCOPY  2012   INTRAMEDULLARY (IM) NAIL INTERTROCHANTERIC Right 02/07/2023   Procedure: INTRAMEDULLARY (IM) NAIL INTERTROCHANTERIC;  Surgeon: Elner Hahn, MD;  Location: [facility];  Service: Orthopedics;  Laterality: Right;   INTRAMEDULLARY (IM) NAIL INTERTROCHANTERIC Left 08/04/2023   Procedure: FIXATION, FRACTURE, INTERTROCHANTERIC, WITH INTRAMEDULLARY ROD;  Surgeon: Marlynn Singer, MD;  Location: [facility];  Service: Orthopedics;  Laterality: Left;   REFRACTIVE SURGERY      Allergies  Allergen Reactions   Codeine     Doesn't remember reaction   Cortisone     Doesn't remember reaction   Decongestant [Pseudoephedrine Hcl] Other (See Comments)    Avoid decongestants due to glaucoma hx.     Donepezil      Prolonged Qt   Naproxen     Stomach issues   Simvastatin      aches  Statins Other (See Comments)    Aches     Outpatient Encounter Medications as of 08/12/2023  Medication Sig   acetaminophen  (TYLENOL ) 325 MG tablet Take 1-2 tablets (325-650 mg total) by mouth every 6 (six) hours as needed for mild pain (pain score 1-3) (pain score 1-3 or temp > 100.5).   aspirin  EC 81 MG tablet Take 81 mg by mouth daily.   Cholecalciferol  (VITAMIN D ) 50 MCG (2000 UT) CAPS Take 1 capsule (2,000 Units total) by mouth daily.   digoxin  (LANOXIN ) 0.125 MG tablet Take 1 tablet (0.125 mg total) by mouth daily.   empagliflozin  (JARDIANCE ) 10  MG TABS tablet Take 10 mg by mouth daily.   feeding supplement (ENSURE ENLIVE / ENSURE PLUS) LIQD Take 237 mLs by mouth 2 (two) times daily between meals. (Patient not taking: Reported on 08/07/2023)   lactose free nutrition (BOOST) LIQD Take 237 mLs by mouth 2 (two) times daily.   levothyroxine  (SYNTHROID ) 25 MCG tablet TAKE 1 TABLET BY MOUTH ONCE DAILY ON AN EMPTY STOMACH. WAIT 30 MINUTES BEFORE TAKING OTHER MEDS.   melatonin 5 MG TABS Take 5 mg by mouth at bedtime as needed.   memantine  (NAMENDA ) 5 MG tablet Take 5 mg by mouth 2 (two) times daily.   metoprolol  succinate (TOPROL -XL) 25 MG 24 hr tablet Take 12.5 mg by mouth daily.   midodrine  (PROAMATINE ) 10 MG tablet Take 10 mg by mouth 3 (three) times daily.   OXYGEN  Inhale into the lungs. 2lpm as needed.   pantoprazole  (PROTONIX ) 20 MG tablet Take 20 mg by mouth daily.   polyethylene glycol (MIRALAX  / GLYCOLAX ) 17 g packet Take 17 g by mouth daily.   Potassium 99 MG TABS Take 2 tablets (198 mg total) by mouth daily. (Patient not taking: Reported on 08/08/2023)   senna-docusate (SENOKOT-S) 8.6-50 MG tablet Take 2 tablets by mouth 2 (two) times daily.   torsemide (DEMADEX) 10 MG tablet Take 10 mg by mouth daily.   traZODone  (DESYREL ) 50 MG tablet Take 50 mg by mouth at bedtime as needed for sleep.   Zinc Oxide (TRIPLE PASTE) 40 % PSTE Apply 1 Application topically as needed (to buttocks and sacrum).   No facility-administered encounter medications on file as of 08/12/2023.    Review of Systems  Immunization History  Administered Date(s) Administered   Influenza Inj Mdck Quad Pf 01/28/2019   Influenza Split 01/25/2011, 01/24/2012, 01/21/2013   Influenza, High Dose Seasonal PF 03/15/2021, 01/08/2022, 03/07/2023   Influenza,inj,Quad PF,6+ Mos 01/14/2017   Influenza-Unspecified 01/20/2014, 01/19/2016, 01/17/2018, 01/22/2020   Moderna Sars-Covid-2 Vaccination 05/06/2019, 06/03/2019, 03/07/2020   PNEUMOCOCCAL CONJUGATE-20 03/11/2023    Pneumococcal Conjugate-13 05/24/2014   Pneumococcal Polysaccharide-23 05/14/2011   Respiratory Syncytial Virus Vaccine,Recomb Aduvanted(Arexvy) 03/11/2023   Tdap 01/07/2012, 02/07/2023   Pertinent  Health Maintenance Due  Topic Date Due   OPHTHALMOLOGY EXAM  Never done   INFLUENZA VACCINE  11/22/2023   HEMOGLOBIN A1C  01/11/2024   FOOT EXAM  07/15/2024   DEXA SCAN  Completed      02/15/2022    1:21 PM 05/08/2022   12:30 PM 06/13/2022    3:40 PM 08/10/2022    3:02 PM 07/16/2023    1:13 PM  Fall Risk  Falls in the past year?  1 0 1 1  Was there an injury with Fall?  0 0 1 1  Fall Risk Category Calculator  2 0 3 2  (RETIRED) Patient Fall Risk Level Low fall risk  Patient at Risk for Falls Due to  History of fall(s);Impaired balance/gait No Fall Risks Impaired balance/gait;Impaired mobility;History of fall(s) History of fall(s)  Fall risk Follow up  Falls evaluation completed Education provided;Falls prevention discussed Falls evaluation completed;Falls prevention discussed    Functional Status Survey:    Vitals:   08/12/23 2133  BP: 124/80  Pulse: 81  Temp: 97.7 F (36.5 C)  SpO2: 91%  Weight: 124 lb (56.2 kg)   Body mass index is 21.28 kg/m. Physical Exam Cardiovascular:     Rate and Rhythm: Normal rate.  Pulmonary:     Effort: Pulmonary effort is normal.  Abdominal:     General: Abdomen is flat.  Neurological:     Mental Status: She is alert. Mental status is at baseline. She is disoriented.     Labs reviewed: Recent Labs    02/08/23 0455 02/09/23 0631 02/10/23 0656 02/11/23 0352 02/12/23 0349 02/13/23 0932 08/05/23 0435 08/06/23 0434 08/07/23 0322  NA 140 137   < > 140 138   < > 139 138 135  K 4.2 4.6   < > 3.4* 3.8   < > 4.3 3.9 4.1  CL 110 108   < > 104 102   < > 105 106 106  CO2 22 22   < > 27 27   < > 22 23 24   GLUCOSE 96 149*   < > 86 99   < > 234* 86 222*  BUN 21 27*   < > 18 16   < > 24* 26* 22  CREATININE 1.08* 1.19*   < > 0.60 0.56   <  > 1.28* 0.90 0.83  CALCIUM  8.2* 8.3*   < > 8.0* 8.2*   < > 8.1* 8.3* 8.6*  MG 1.7 2.3  --  1.9 2.0  --   --   --   --   PHOS 4.1 4.4  --   --  2.8  --   --   --   --    < > = values in this interval not displayed.   Recent Labs    02/07/23 0814 02/10/23 0656 07/11/23 0000  AST 37 47* 14  ALT 17 7 5*  ALKPHOS 37* 37* 73  BILITOT 0.7 0.9  --   PROT 5.0* 4.9*  --   ALBUMIN 2.8* 2.6* 3.7   Recent Labs    02/07/23 0814 02/07/23 1424 02/21/23 0000 04/11/23 0000 07/11/23 0000 08/05/23 0435 08/06/23 0434 08/07/23 0322  WBC 20.2*   < > 9.0 10.6   < > 16.0* 11.7* 10.0  NEUTROABS 17.1*  --  6,642.00 8,226.00  --   --   --   --   HGB 10.0*   < > 10.8* 13.2   < > 10.6* 8.8* 8.3*  HCT 31.1*   < > 34* 40   < > 34.4* 27.6* 26.1*  MCV 93.4   < >  --   --    < > 91.0 89.3 89.7  PLT 208   < > 526* 334   < > 210 177 200   < > = values in this interval not displayed.   Lab Results  Component Value Date   TSH 2.176 08/05/2023   Lab Results  Component Value Date   HGBA1C 8.3 07/11/2023   Lab Results  Component Value Date   CHOL 226 (H) 05/08/2022   HDL 62.30 05/08/2022   LDLCALC 137 (H) 05/08/2022   TRIG 133.0 05/08/2022  CHOLHDL 4 05/08/2022    Significant Diagnostic Results in last 30 days:  CT HEAD WO CONTRAST ( ) Result Date: 08/05/2023 CLINICAL DATA:  unwitnessed fall EXAM: CT HEAD WITHOUT CONTRAST TECHNIQUE: Contiguous axial images were obtained from the base of the skull through the vertex without intravenous contrast. RADIATION DOSE REDUCTION: This exam was performed according to the departmental dose-optimization program which includes automated exposure control, adjustment of the mA and/or kV according to patient size and/or use of iterative reconstruction technique. COMPARISON:  CT head February 07, 2023. FINDINGS: Brain: No evidence of acute infarction, hemorrhage, hydrocephalus, extra-axial collection or mass lesion/mass effect. Patchy white matter hypodensities,  nonspecific but compatible with chronic microvascular disease. Vascular: No hyperdense vessel or unexpected calcification. Skull: No acute fracture. Sinuses/Orbits: Clear sinuses.  No acute orbital findings. Other: No mastoid effusions. IMPRESSION: No evidence of acute intracranial abnormality. Electronically Signed   By: Stevenson Elbe M.D.   On: 08/05/2023 19:44   US  ARTERIAL ABI (SCREENING LOWER EXTREMITY) Result Date: 08/05/2023 CLINICAL DATA:  Absent pulses in the lower extremities. EXAM: NONINVASIVE PHYSIOLOGIC VASCULAR STUDY OF BILATERAL LOWER EXTREMITIES TECHNIQUE: Evaluation of both lower extremities were performed at rest, including calculation of ankle-brachial indices with single level pressure measurements and doppler recording. COMPARISON:  None available. FINDINGS: Right ABI:  0.93 Left ABI:  1.30 Right Lower Extremity: Posterior tibial and dorsalis pedis artery waveforms are monophasic. Left Lower Extremity: Posterior tibial and dorsalis pedis artery waveforms are monophasic. 0.9-0.99 Borderline PAD IMPRESSION: At least borderline lower extremity peripheral arterial disease in the right lower extremity. The degree of stenosis may be more significant given abnormal bilateral waveforms. Consider further evaluation with CT angiography lower extremities. Electronically Signed   By: Elester Grim M.D.   On: 08/05/2023 11:26   DG HIP UNILAT WITH PELVIS 2-3 VIEWS LEFT Result Date: 08/04/2023 CLINICAL DATA:  Left intramedullary nail EXAM: DG HIP (WITH OR WITHOUT PELVIS) 2-3V LEFT COMPARISON:  Left hip x-ray 08/04/2023. FINDINGS: Three intraoperative fluoroscopic views of the left hip. Left femoral intramedullary nail and hip screw replaced. Alignment is anatomic as can be seen. IMPRESSION: Left femoral intramedullary nail and hip screw replaced. Electronically Signed   By: Tyron Gallon M.D.   On: 08/04/2023 20:27   DG C-Arm 1-60 Min-No Report Result Date: 08/04/2023 Fluoroscopy was utilized by  the requesting physician.  No radiographic interpretation.   DG C-Arm 1-60 Min-No Report Result Date: 08/04/2023 Fluoroscopy was utilized by the requesting physician.  No radiographic interpretation.   DG Chest Portable 1 View Result Date: 08/04/2023 CLINICAL DATA:  Pain, LEFT hip fall. EXAM: PORTABLE CHEST 1 VIEW COMPARISON:  Radiograph 02/09/2023 FINDINGS: Large hiatal hernia posterior to the heart. Entire stomach is above the hemidiaphragm. No pulmonary edema or pleural fluid. No pneumothorax. No acute osseous abnormality. IMPRESSION: 1. No acute cardiopulmonary process. 2. Large hiatal hernia. Electronically Signed   By: Deboraha Fallow M.D.   On: 08/04/2023 13:11   DG Hip Unilat With Pelvis 2-3 Views Left Result Date: 08/04/2023 CLINICAL DATA:  Fall, LEFT hip pain EXAM: DG HIP (WITH OR WITHOUT PELVIS) 2-3V LEFT COMPARISON:  None Available. FINDINGS: Intertrochanteric fracture of the LEFT femoral neck. Mild caudad migration of the femur. No angulation. Avulsion of the lesser trochanter. Lucency in the LEFT inferior pubic ramus. Potential aggressive osseous lesion. Internal fixation of the RIGHT hip. IMPRESSION: Intertrochanteric fracture of the LEFT femoral neck. Lucency in the LEFT inferior pubic ramus. Potential aggressive osseous lesion. Electronically Signed   By: Deboraha Fallow  M.D.   On: 08/04/2023 13:09    Assessment/Plan Diastolic CHF with preserved left ventricular function, NYHA class 2 (HCC) Patient with improved breathing after receiving scheduled diuretics. Labs pending at this time. Continue close monitoring with daily weights. Supplement potassium as indicated. Continue GOC conversations with family  Family/ staff Communication: Nursing who spoke with family and relayed lab results.   Labs/tests ordered:  BMP

## 2023-08-15 DIAGNOSIS — I10 Essential (primary) hypertension: Secondary | ICD-10-CM | POA: Diagnosis not present

## 2023-08-15 DIAGNOSIS — Z4889 Encounter for other specified surgical aftercare: Secondary | ICD-10-CM | POA: Diagnosis not present

## 2023-08-15 DIAGNOSIS — S72142A Displaced intertrochanteric fracture of left femur, initial encounter for closed fracture: Secondary | ICD-10-CM | POA: Diagnosis not present

## 2023-08-15 DIAGNOSIS — R0602 Shortness of breath: Secondary | ICD-10-CM | POA: Diagnosis not present

## 2023-08-15 LAB — CBC AND DIFFERENTIAL
HCT: 34 — AB (ref 36–46)
Hemoglobin: 10 — AB (ref 12.0–16.0)
Neutrophils Absolute: 9957
Platelets: 367 10*3/uL (ref 150–400)
WBC: 12.4

## 2023-08-15 LAB — BASIC METABOLIC PANEL WITH GFR
BUN: 17 (ref 4–21)
CO2: 31 — AB (ref 13–22)
Chloride: 98 — AB (ref 99–108)
Creatinine: 0.7 (ref 0.5–1.1)
Glucose: 154
Potassium: 3.6 meq/L (ref 3.5–5.1)
Sodium: 141 (ref 137–147)

## 2023-08-15 LAB — CBC: RBC: 3.62 — AB (ref 3.87–5.11)

## 2023-08-15 LAB — COMPREHENSIVE METABOLIC PANEL WITH GFR
Calcium: 8.7 (ref 8.7–10.7)
eGFR: 79

## 2023-08-23 ENCOUNTER — Encounter: Payer: Self-pay | Admitting: Student

## 2023-08-23 ENCOUNTER — Non-Acute Institutional Stay (SKILLED_NURSING_FACILITY): Payer: Self-pay | Admitting: Student

## 2023-08-23 DIAGNOSIS — I5023 Acute on chronic systolic (congestive) heart failure: Secondary | ICD-10-CM | POA: Diagnosis not present

## 2023-08-23 DIAGNOSIS — F03C0 Unspecified dementia, severe, without behavioral disturbance, psychotic disturbance, mood disturbance, and anxiety: Secondary | ICD-10-CM

## 2023-08-23 DIAGNOSIS — R001 Bradycardia, unspecified: Secondary | ICD-10-CM

## 2023-08-23 NOTE — Progress Notes (Signed)
 Location:  Other [place].  Nursing Home Room Number: [facility] [address]A Place of Service:  SNF [phone]) Provider:  [name], MD  Patient Care Team: [name], MD as PCP - General (Family Medicine) [name], MD as Referring Physician (Ophthalmology) [name], MD as Consulting Physician (Cardiology)  Extended Emergency Contact Information Primary Emergency Contact: [name],[name]  [place] Home Phone: [phone] Relation: Daughter Secondary Emergency Contact: Jersey,[name] Mobile Phone: [phone] Relation: Son  Code Status:  DNR Goals of care: Advanced Directive information    08/05/2023   12:45 AM  Advanced Directives  Does Patient Have a Medical Advance Directive? Yes  Type of Advance Directive Out of facility DNR (pink MOST or yellow form)     Chief Complaint  Patient presents with   Decline    End of Life Care.     HPI:  Pt is a 89 y.o. female seen today for End of Life Care.  Patient with history of dementia and recent admission for hip fracture which occurred on 4/13. Post discharge stay complicated by ABLA and CHF exacerbation. Daughter with concern that she is continuing to decline and would like guidance on how to proceed. Patient as had little to no food x5 days. She is also currently on oxygen  supplementation.   Past Medical History:  Diagnosis Date   Anemia    treated ~2009 with iron, resolved   Arthritis    Cataract    surgery on R catarct 2012   Chronic systolic CHF (congestive heart failure) (HCC)    a. TTE 2012: EF of 45-50%, mild diffuse HK, trivial AI, mild MR, mildly dilated RV with mild reduction of RVSF, mild to moderate TR, mild to moderate PR, mildly elevated PASP   COPD (chronic obstructive pulmonary disease) (HCC)    Family history of adverse reaction to anesthesia    Mother - altered mental status (long term)   Glaucoma    History of pelvic fracture    Hyperglycemia    mild inc in  fasting sugar   Hyperlipidemia    Hypothyroidism    Insomnia    Migraines    without aura, sx since age 64   Osteopenia    prev with 10 years of fosamax and intolerant of evista   PAF (paroxysmal atrial fibrillation) (HCC)    a. initially noted 2012; b. CHADS2VASc at least 5 (CHF, age x 2, vasacular disease, female)   Pulmonary hypertension (HCC)    Wears dentures    full upper and lower   Past Surgical History:  Procedure Laterality Date   CATARACT EXTRACTION     CATARACT EXTRACTION W/PHACO Left 08/27/2016   Procedure: CATARACT EXTRACTION PHACO AND INTRAOCULAR LENS PLACEMENT (IOC)  Left;  Surgeon: [name], MD;  Location: [facility];  Service: Ophthalmology;  Laterality: Left;   COLONOSCOPY  2012   INTRAMEDULLARY (IM) NAIL INTERTROCHANTERIC Right 02/07/2023   Procedure: INTRAMEDULLARY (IM) NAIL INTERTROCHANTERIC;  Surgeon: Elner Hahn, MD;  Location: [facility];  Service: Orthopedics;  Laterality: Right;   INTRAMEDULLARY (IM) NAIL INTERTROCHANTERIC Left 08/04/2023   Procedure: FIXATION, FRACTURE, INTERTROCHANTERIC, WITH INTRAMEDULLARY ROD;  Surgeon: Marlynn Singer, MD;  Location: [facility];  Service: Orthopedics;  Laterality: Left;   REFRACTIVE SURGERY      Allergies  Allergen Reactions   Codeine     Doesn't remember reaction   Cortisone     Doesn't remember reaction   Decongestant [Pseudoephedrine Hcl] Other (See Comments)    Avoid decongestants due  to glaucoma hx.     Donepezil      Prolonged Qt   Naproxen     Stomach issues   Simvastatin      aches   Statins Other (See Comments)    Aches     Outpatient Encounter Medications as of 08/23/2023  Medication Sig   acetaminophen  (TYLENOL ) 325 MG tablet Take 1-2 tablets (325-650 mg total) by mouth every 6 (six) hours as needed for mild pain (pain score 1-3) (pain score 1-3 or temp > 100.5).   aspirin  EC 81 MG tablet Take 81 mg by mouth daily.   Cholecalciferol  (VITAMIN D ) 50 MCG (2000 UT) CAPS Take 1 capsule  (2,000 Units total) by mouth daily.   digoxin  (LANOXIN ) 0.125 MG tablet Take 1 tablet (0.125 mg total) by mouth daily.   empagliflozin  (JARDIANCE ) 10 MG TABS tablet Take 10 mg by mouth daily.   HYDROcodone -acetaminophen  (NORCO/VICODIN) 5-325 MG tablet Take 1 tablet by mouth every 6 (six) hours as needed for moderate pain (pain score 4-6).   ipratropium-albuterol  (DUONEB) 0.5-2.5 (3) MG/3ML SOLN Take 3 mLs by nebulization every 6 (six) hours as needed.   lactose free nutrition (BOOST) LIQD Take 237 mLs by mouth 2 (two) times daily.   levothyroxine  (SYNTHROID ) 25 MCG tablet TAKE 1 TABLET BY MOUTH ONCE DAILY ON AN EMPTY STOMACH. WAIT 30 MINUTES BEFORE TAKING OTHER MEDS.   melatonin 5 MG TABS Take 5 mg by mouth at bedtime as needed.   memantine  (NAMENDA ) 5 MG tablet Take 5 mg by mouth 2 (two) times daily.   metoprolol  succinate (TOPROL -XL) 25 MG 24 hr tablet Take 12.5 mg by mouth daily.   midodrine  (PROAMATINE ) 10 MG tablet Take 10 mg by mouth 3 (three) times daily.   OXYGEN  Inhale into the lungs. 2lpm as needed.   pantoprazole  (PROTONIX ) 20 MG tablet Take 20 mg by mouth daily.   polyethylene glycol (MIRALAX  / GLYCOLAX ) 17 g packet Take 17 g by mouth daily.   senna-docusate (SENOKOT-S) 8.6-50 MG tablet Take 2 tablets by mouth 2 (two) times daily.   torsemide (DEMADEX) 10 MG tablet Take 10 mg by mouth daily.   traZODone  (DESYREL ) 50 MG tablet Take 50 mg by mouth at bedtime as needed for sleep.   Zinc Oxide (TRIPLE PASTE) 40 % PSTE Apply 1 Application topically as needed (to buttocks and sacrum).   No facility-administered encounter medications on file as of 08/23/2023.    Review of Systems  Immunization History  Administered Date(s) Administered   Influenza Inj Mdck Quad Pf 01/28/2019   Influenza Split 01/25/2011, 01/24/2012, 01/21/2013   Influenza, High Dose Seasonal PF 03/15/2021, 01/08/2022, 03/07/2023   Influenza,inj,Quad PF,6+ Mos 01/14/2017   Influenza-Unspecified 01/20/2014, 01/19/2016,  01/17/2018, 01/22/2020   Moderna Sars-Covid-2 Vaccination 05/06/2019, 06/03/2019, 03/07/2020   PNEUMOCOCCAL CONJUGATE-20 03/11/2023   Pneumococcal Conjugate-13 05/24/2014   Pneumococcal Polysaccharide-23 05/14/2011   Respiratory Syncytial Virus Vaccine,Recomb Aduvanted(Arexvy) 03/11/2023   Tdap 01/07/2012, 02/07/2023   Pertinent  Health Maintenance Due  Topic Date Due   OPHTHALMOLOGY EXAM  Never done   INFLUENZA VACCINE  11/22/2023   HEMOGLOBIN A1C  01/11/2024   FOOT EXAM  07/15/2024   DEXA SCAN  Completed      02/15/2022    1:21 PM 05/08/2022   12:30 PM 06/13/2022    3:40 PM 08/10/2022    3:02 PM 07/16/2023    1:13 PM  Fall Risk  Falls in the past year?  1 0 1 1  Was there an injury with Fall?  0 0 1 1  Fall Risk Category Calculator  2 0 3 2  (RETIRED) Patient Fall Risk Level Low fall risk      Patient at Risk for Falls Due to  History of fall(s);Impaired balance/gait No Fall Risks Impaired balance/gait;Impaired mobility;History of fall(s) History of fall(s)  Fall risk Follow up  Falls evaluation completed Education provided;Falls prevention discussed Falls evaluation completed;Falls prevention discussed    Functional Status Survey:    Vitals:   08/23/23 1004  BP: 118/72  Pulse: 67  Resp: 18  Temp: 97.8 F (36.6 C)  SpO2: 90%  Weight: 119 lb (54 kg)  Height: 5\' 4"  (1.626 m)   Body mass index is 20.43 kg/m. Physical Exam Constitutional:      Comments: Sitting in wheelchair, sleeping and difficult to arouse  Cardiovascular:     Rate and Rhythm: Bradycardia present.     Comments: Thready pulses Pulmonary:     Comments: Wearing 2LNC    Labs reviewed: Recent Labs    02/08/23 0455 02/09/23 0631 02/10/23 0656 02/11/23 0352 02/12/23 0349 02/13/23 0932 08/05/23 0435 08/06/23 0434 08/07/23 0322 08/15/23 0000  NA 140 137   < > 140 138   < > 139 138 135 141  K 4.2 4.6   < > 3.4* 3.8   < > 4.3 3.9 4.1 3.6  CL 110 108   < > 104 102   < > 105 106 106 98*  CO2  22 22   < > 27 27   < > 22 23 24  31*  GLUCOSE 96 149*   < > 86 99   < > 234* 86 222*  --   BUN 21 27*   < > 18 16   < > 24* 26* 22 17  CREATININE 1.08* 1.19*   < > 0.60 0.56   < > 1.28* 0.90 0.83 0.7  CALCIUM  8.2* 8.3*   < > 8.0* 8.2*   < > 8.1* 8.3* 8.6* 8.7  MG 1.7 2.3  --  1.9 2.0  --   --   --   --   --   PHOS 4.1 4.4  --   --  2.8  --   --   --   --   --    < > = values in this interval not displayed.   Recent Labs    02/07/23 0814 02/10/23 0656 07/11/23 0000  AST 37 47* 14  ALT 17 7 5*  ALKPHOS 37* 37* 73  BILITOT 0.7 0.9  --   PROT 5.0* 4.9*  --   ALBUMIN 2.8* 2.6* 3.7   Recent Labs    02/21/23 0000 04/11/23 0000 07/11/23 0000 08/05/23 0435 08/06/23 0434 08/07/23 0322 08/15/23 0000  WBC 9.0 10.6   < > 16.0* 11.7* 10.0 12.4  NEUTROABS 6,642.00 8,226.00  --   --   --   --  9,957.00  HGB 10.8* 13.2   < > 10.6* 8.8* 8.3* 10.0*  HCT 34* 40   < > 34.4* 27.6* 26.1* 34*  MCV  --   --    < > 91.0 89.3 89.7  --   PLT 526* 334   < > 210 177 200 367   < > = values in this interval not displayed.   Lab Results  Component Value Date   TSH 2.176 08/05/2023   Lab Results  Component Value Date   HGBA1C 8.3 07/11/2023   Lab Results  Component Value Date  CHOL 226 (H) 05/08/2022   HDL 62.30 05/08/2022   LDLCALC 137 (H) 05/08/2022   TRIG 133.0 05/08/2022   CHOLHDL 4 05/08/2022    Significant Diagnostic Results in last 30 days:  CT HEAD WO CONTRAST ( ) Result Date: 08/05/2023 CLINICAL DATA:  unwitnessed fall EXAM: CT HEAD WITHOUT CONTRAST TECHNIQUE: Contiguous axial images were obtained from the base of the skull through the vertex without intravenous contrast. RADIATION DOSE REDUCTION: This exam was performed according to the departmental dose-optimization program which includes automated exposure control, adjustment of the mA and/or kV according to patient size and/or use of iterative reconstruction technique. COMPARISON:  CT head February 07, 2023. FINDINGS: Brain: No  evidence of acute infarction, hemorrhage, hydrocephalus, extra-axial collection or mass lesion/mass effect. Patchy white matter hypodensities, nonspecific but compatible with chronic microvascular disease. Vascular: No hyperdense vessel or unexpected calcification. Skull: No acute fracture. Sinuses/Orbits: Clear sinuses.  No acute orbital findings. Other: No mastoid effusions. IMPRESSION: No evidence of acute intracranial abnormality. Electronically Signed   By: Stevenson Elbe M.D.   On: 08/05/2023 19:44   US  ARTERIAL ABI (SCREENING LOWER EXTREMITY) Result Date: 08/05/2023 CLINICAL DATA:  Absent pulses in the lower extremities. EXAM: NONINVASIVE PHYSIOLOGIC VASCULAR STUDY OF BILATERAL LOWER EXTREMITIES TECHNIQUE: Evaluation of both lower extremities were performed at rest, including calculation of ankle-brachial indices with single level pressure measurements and doppler recording. COMPARISON:  None available. FINDINGS: Right ABI:  0.93 Left ABI:  1.30 Right Lower Extremity: Posterior tibial and dorsalis pedis artery waveforms are monophasic. Left Lower Extremity: Posterior tibial and dorsalis pedis artery waveforms are monophasic. 0.9-0.99 Borderline PAD IMPRESSION: At least borderline lower extremity peripheral arterial disease in the right lower extremity. The degree of stenosis may be more significant given abnormal bilateral waveforms. Consider further evaluation with CT angiography lower extremities. Electronically Signed   By: Elester Grim M.D.   On: 08/05/2023 11:26   DG HIP UNILAT WITH PELVIS 2-3 VIEWS LEFT Result Date: 08/04/2023 CLINICAL DATA:  Left intramedullary nail EXAM: DG HIP (WITH OR WITHOUT PELVIS) 2-3V LEFT COMPARISON:  Left hip x-ray 08/04/2023. FINDINGS: Three intraoperative fluoroscopic views of the left hip. Left femoral intramedullary nail and hip screw replaced. Alignment is anatomic as can be seen. IMPRESSION: Left femoral intramedullary nail and hip screw replaced. Electronically  Signed   By: Tyron Gallon M.D.   On: 08/04/2023 20:27   DG C-Arm 1-60 Min-No Report Result Date: 08/04/2023 Fluoroscopy was utilized by the requesting physician.  No radiographic interpretation.   DG C-Arm 1-60 Min-No Report Result Date: 08/04/2023 Fluoroscopy was utilized by the requesting physician.  No radiographic interpretation.   DG Chest Portable 1 View Result Date: 08/04/2023 CLINICAL DATA:  Pain, LEFT hip fall. EXAM: PORTABLE CHEST 1 VIEW COMPARISON:  Radiograph 02/09/2023 FINDINGS: Large hiatal hernia posterior to the heart. Entire stomach is above the hemidiaphragm. No pulmonary edema or pleural fluid. No pneumothorax. No acute osseous abnormality. IMPRESSION: 1. No acute cardiopulmonary process. 2. Large hiatal hernia. Electronically Signed   By: Deboraha Fallow M.D.   On: 08/04/2023 13:11   DG Hip Unilat With Pelvis 2-3 Views Left Result Date: 08/04/2023 CLINICAL DATA:  Fall, LEFT hip pain EXAM: DG HIP (WITH OR WITHOUT PELVIS) 2-3V LEFT COMPARISON:  None Available. FINDINGS: Intertrochanteric fracture of the LEFT femoral neck. Mild caudad migration of the femur. No angulation. Avulsion of the lesser trochanter. Lucency in the LEFT inferior pubic ramus. Potential aggressive osseous lesion. Internal fixation of the RIGHT hip. IMPRESSION: Intertrochanteric fracture of the  LEFT femoral neck. Lucency in the LEFT inferior pubic ramus. Potential aggressive osseous lesion. Electronically Signed   By: Deboraha Fallow M.D.   On: 08/04/2023 13:09    Assessment/Plan Bradycardia  Acute on chronic systolic CHF (congestive heart failure) (HCC)  Severe dementia, unspecified dementia type, unspecified whether behavioral, psychotic, or mood disturbance or anxiety (HCC) Patient has had continued decline over the last 3 weeks. She has not progressed with therapy due to stage of dementia. Recent CHF exacerbation leading to complex recovery from hip fracture. Patient appears comfortable. Discussed  importance of symptom control. She is end of life care at this time with families desire for comfort with decline. She is difficult to arouse and has had difficulty with oral medications. Will discontinue meds to only continue torsemide, midodrine , and potassium. Offered hospice, however, declined at this time. Will continue supportive care.   Family/ staff Communication: nursing, daughter Clarnce Crow ordered:  none

## 2023-09-03 ENCOUNTER — Non-Acute Institutional Stay (SKILLED_NURSING_FACILITY): Payer: Self-pay | Admitting: Nurse Practitioner

## 2023-09-03 ENCOUNTER — Encounter: Payer: Self-pay | Admitting: Nurse Practitioner

## 2023-09-03 DIAGNOSIS — E1169 Type 2 diabetes mellitus with other specified complication: Secondary | ICD-10-CM

## 2023-09-03 DIAGNOSIS — E46 Unspecified protein-calorie malnutrition: Secondary | ICD-10-CM | POA: Diagnosis not present

## 2023-09-03 DIAGNOSIS — I5030 Unspecified diastolic (congestive) heart failure: Secondary | ICD-10-CM | POA: Diagnosis not present

## 2023-09-03 DIAGNOSIS — D62 Acute posthemorrhagic anemia: Secondary | ICD-10-CM

## 2023-09-03 DIAGNOSIS — F03C0 Unspecified dementia, severe, without behavioral disturbance, psychotic disturbance, mood disturbance, and anxiety: Secondary | ICD-10-CM | POA: Diagnosis not present

## 2023-09-03 DIAGNOSIS — J9611 Chronic respiratory failure with hypoxia: Secondary | ICD-10-CM | POA: Diagnosis not present

## 2023-09-03 DIAGNOSIS — S72002D Fracture of unspecified part of neck of left femur, subsequent encounter for closed fracture with routine healing: Secondary | ICD-10-CM | POA: Diagnosis not present

## 2023-09-03 NOTE — Assessment & Plan Note (Signed)
 Diet controlled, continue to monitor A1c every 6 months

## 2023-09-03 NOTE — Assessment & Plan Note (Signed)
 Continues with supplements, has had positive steady weight gain noted

## 2023-09-03 NOTE — Assessment & Plan Note (Signed)
 Stable, no acute changes in cognitive or functional status, continue supportive care.

## 2023-09-03 NOTE — Assessment & Plan Note (Signed)
 encouraged to liberalize diet. To have protein supplement in addition to smallest meal of the day. Weight has been stable in the last month.

## 2023-09-03 NOTE — Assessment & Plan Note (Signed)
 Hgb stable at this time, will continue to monitor

## 2023-09-03 NOTE — Assessment & Plan Note (Signed)
 Stable at this time, no significant edema,shortness of breath noted Lung sounds normal bilaterally Continues on demadex 10 mg daily

## 2023-09-03 NOTE — Progress Notes (Signed)
 Location:  Other [place].  Nursing Home Room Number: [name] [address] Place of Service:  SNF [phone]) Gilbert Lab, NP  PCP: [name], MD  Patient Care Team: [name], MD as PCP - General (Family Medicine) [name], MD as Referring Physician (Ophthalmology) [name], MD as Consulting Physician (Cardiology)  Extended Emergency Contact Information Primary Emergency Contact: [name],[name]  [place] Home Phone: [phone] Relation: Daughter Secondary Emergency Contact: Boxx,[name] Mobile Phone: [phone] Relation: Son  Goals of care: Advanced Directive information    08/05/2023   12:45 AM  Advanced Directives  Does Patient Have a Medical Advance Directive? Yes  Type of Advance Directive Out of facility DNR (pink MOST or yellow form)     Chief Complaint  Patient presents with   Medical Management of Chronic Issues    Medical Management of Chronic Issues.     HPI:  Pt is a 89 y.o. female seen today for medical management of chronic disease.   Staff thought she had some abnormal lung sounds to right lower lobe this morning. No cough or congestion noted.  No shortness of breath noted Continues on 2L o2 to maintain oxygen  level over 90% She has had a steady weight gain without abrupt changes in weight.  Mild LE edema and has TED hose ordered if needed.   She has no complaints of pain and staff states she does not ever report pain to them.   Hx of anemia after surgery due to hip fracture (on 08/04/2023)but improvement on recent labs.   Past Medical History:  Diagnosis Date   Anemia    treated ~2009 with iron, resolved   Arthritis    Cataract    surgery on R catarct 2012   Chronic systolic CHF (congestive heart failure) (HCC)    a. TTE 2012: EF of 45-50%, mild diffuse HK, trivial AI, mild MR, mildly dilated RV with mild reduction of RVSF, mild to moderate TR, mild to moderate PR, mildly elevated PASP   COPD  (chronic obstructive pulmonary disease) (HCC)    Family history of adverse reaction to anesthesia    Mother - altered mental status (long term)   Glaucoma    History of pelvic fracture    Hyperglycemia    mild inc in fasting sugar   Hyperlipidemia    Hypothyroidism    Insomnia    Migraines    without aura, sx since age 29   Osteopenia    prev with 10 years of fosamax and intolerant of evista   PAF (paroxysmal atrial fibrillation) (HCC)    a. initially noted 2012; b. CHADS2VASc at least 5 (CHF, age x 2, vasacular disease, female)   Pulmonary hypertension (HCC)    Wears dentures    full upper and lower   Past Surgical History:  Procedure Laterality Date   CATARACT EXTRACTION     CATARACT EXTRACTION W/PHACO Left 08/27/2016   Procedure: CATARACT EXTRACTION PHACO AND INTRAOCULAR LENS PLACEMENT (IOC)  Left;  Surgeon: [name], MD;  Location: [facility];  Service: Ophthalmology;  Laterality: Left;   COLONOSCOPY  2012   INTRAMEDULLARY (IM) NAIL INTERTROCHANTERIC Right 02/07/2023   Procedure: INTRAMEDULLARY (IM) NAIL INTERTROCHANTERIC;  Surgeon: Elner Hahn, MD;  Location: [facility] ORS;  Service: Orthopedics;  Laterality: Right;   INTRAMEDULLARY (IM) NAIL INTERTROCHANTERIC Left 08/04/2023   Procedure: FIXATION, FRACTURE, INTERTROCHANTERIC, WITH INTRAMEDULLARY ROD;  Surgeon: Marlynn Singer, MD;  Location: [facility] ORS;  Service: Orthopedics;  Laterality: Left;   REFRACTIVE SURGERY  Allergies  Allergen Reactions   Codeine     Doesn't remember reaction   Cortisone     Doesn't remember reaction   Decongestant [Pseudoephedrine Hcl] Other (See Comments)    Avoid decongestants due to glaucoma hx.     Donepezil      Prolonged Qt   Naproxen     Stomach issues   Simvastatin      aches   Statins Other (See Comments)    Aches     Outpatient Encounter Medications as of 09/03/2023  Medication Sig   acetaminophen  (TYLENOL ) 325 MG tablet Take 650 mg by mouth every 6 (six)  hours as needed.   ipratropium-albuterol  (DUONEB) 0.5-2.5 (3) MG/3ML SOLN Take 3 mLs by nebulization every 6 (six) hours as needed.   lactose free nutrition (BOOST) LIQD Take 237 mLs by mouth 2 (two) times daily.   LORazepam (ATIVAN) 0.5 MG tablet Take 0.5 mg by mouth every 4 (four) hours as needed for anxiety.   melatonin 5 MG TABS Take 5 mg by mouth at bedtime as needed.   midodrine  (PROAMATINE ) 10 MG tablet Take 10 mg by mouth 3 (three) times daily.   morphine  20 MG/5ML solution Take 0.25 mLs by mouth every 2 (two) hours as needed for pain.   OXYGEN  Inhale into the lungs. 2lpm as needed.   torsemide (DEMADEX) 10 MG tablet Take 10 mg by mouth daily.   Zinc Oxide (TRIPLE PASTE) 40 % PSTE Apply 1 Application topically as needed (to buttocks and sacrum).   [DISCONTINUED] HYDROcodone -acetaminophen  (NORCO/VICODIN) 5-325 MG tablet Take 1 tablet by mouth every 6 (six) hours as needed for moderate pain (pain score 4-6). (Patient not taking: Reported on 09/03/2023)   [DISCONTINUED] levothyroxine  (SYNTHROID ) 25 MCG tablet TAKE 1 TABLET BY MOUTH ONCE DAILY ON AN EMPTY STOMACH. WAIT 30 MINUTES BEFORE TAKING OTHER MEDS. (Patient not taking: Reported on 09/03/2023)   [DISCONTINUED] polyethylene glycol (MIRALAX  / GLYCOLAX ) 17 g packet Take 17 g by mouth daily. (Patient not taking: Reported on 09/03/2023)   [DISCONTINUED] senna-docusate (SENOKOT-S) 8.6-50 MG tablet Take 2 tablets by mouth 2 (two) times daily. (Patient not taking: Reported on 09/03/2023)   No facility-administered encounter medications on file as of 09/03/2023.    Review of Systems  Unable to perform ROS: Dementia     Immunization History  Administered Date(s) Administered   Influenza Inj Mdck Quad Pf 01/28/2019   Influenza Split 01/25/2011, 01/24/2012, 01/21/2013   Influenza, High Dose Seasonal PF 03/15/2021, 01/08/2022, 03/07/2023   Influenza,inj,Quad PF,6+ Mos 01/14/2017   Influenza-Unspecified 01/20/2014, 01/19/2016, 01/17/2018,  01/22/2020   Moderna Sars-Covid-2 Vaccination 05/06/2019, 06/03/2019, 03/07/2020   PNEUMOCOCCAL CONJUGATE-20 03/11/2023   Pneumococcal Conjugate-13 05/24/2014   Pneumococcal Polysaccharide-23 05/14/2011   Respiratory Syncytial Virus Vaccine,Recomb Aduvanted(Arexvy) 03/11/2023   Tdap 01/07/2012, 02/07/2023   Pertinent  Health Maintenance Due  Topic Date Due   OPHTHALMOLOGY EXAM  Never done   INFLUENZA VACCINE  11/22/2023   HEMOGLOBIN A1C  01/11/2024   FOOT EXAM  07/15/2024   DEXA SCAN  Completed      02/15/2022    1:21 PM 05/08/2022   12:30 PM 06/13/2022    3:40 PM 08/10/2022    3:02 PM 07/16/2023    1:13 PM  Fall Risk  Falls in the past year?  1 0 1 1  Was there an injury with Fall?  0 0 1 1  Fall Risk Category Calculator  2 0 3 2  (RETIRED) Patient Fall Risk Level Low fall risk  Patient at Risk for Falls Due to  History of fall(s);Impaired balance/gait No Fall Risks Impaired balance/gait;Impaired mobility;History of fall(s) History of fall(s)  Fall risk Follow up  Falls evaluation completed Education provided;Falls prevention discussed Falls evaluation completed;Falls prevention discussed    Functional Status Survey:    Vitals:   09/03/23 1202  BP: 105/67  Pulse: 67  Resp: 19  Temp: (!) 97.5 F (36.4 C)  SpO2: 94%  Weight: 119 lb 6.4 oz (54.2 kg)  Height: 5\' 4"  (1.626 m)   Body mass index is 20.49 kg/m. Physical Exam Constitutional:      General: She is not in acute distress.    Appearance: Normal appearance. She is well-developed. She is not diaphoretic.  HENT:     Head: Normocephalic and atraumatic.     Mouth/Throat:     Pharynx: No oropharyngeal exudate.  Eyes:     Conjunctiva/sclera: Conjunctivae normal.     Pupils: Pupils are equal, round, and reactive to light.  Cardiovascular:     Rate and Rhythm: Normal rate and regular rhythm.     Heart sounds: Normal heart sounds.  Pulmonary:     Effort: Pulmonary effort is normal.     Breath sounds: Normal  breath sounds.  Abdominal:     General: Bowel sounds are normal.     Palpations: Abdomen is soft.  Musculoskeletal:     Cervical back: Normal range of motion and neck supple.     Right lower leg: No edema.     Left lower leg: No edema.  Skin:    General: Skin is warm and dry.  Neurological:     Mental Status: She is alert. Mental status is at baseline.  Psychiatric:        Mood and Affect: Mood normal.     Labs reviewed: Recent Labs    02/08/23 0455 02/09/23 0631 02/10/23 0656 02/11/23 0352 02/12/23 0349 02/13/23 0932 08/05/23 0435 08/06/23 0434 08/07/23 0322 08/15/23 0000  NA 140 137   < > 140 138   < > 139 138 135 141  K 4.2 4.6   < > 3.4* 3.8   < > 4.3 3.9 4.1 3.6  CL 110 108   < > 104 102   < > 105 106 106 98*  CO2 22 22   < > 27 27   < > 22 23 24  31*  GLUCOSE 96 149*   < > 86 99   < > 234* 86 222*  --   BUN 21 27*   < > 18 16   < > 24* 26* 22 17  CREATININE 1.08* 1.19*   < > 0.60 0.56   < > 1.28* 0.90 0.83 0.7  CALCIUM  8.2* 8.3*   < > 8.0* 8.2*   < > 8.1* 8.3* 8.6* 8.7  MG 1.7 2.3  --  1.9 2.0  --   --   --   --   --   PHOS 4.1 4.4  --   --  2.8  --   --   --   --   --    < > = values in this interval not displayed.   Recent Labs    02/07/23 0814 02/10/23 0656 07/11/23 0000  AST 37 47* 14  ALT 17 7 5*  ALKPHOS 37* 37* 73  BILITOT 0.7 0.9  --   PROT 5.0* 4.9*  --   ALBUMIN 2.8* 2.6* 3.7   Recent Labs    02/21/23 0000  04/11/23 0000 07/11/23 0000 08/05/23 0435 08/06/23 0434 08/07/23 0322 08/15/23 0000  WBC 9.0 10.6   < > 16.0* 11.7* 10.0 12.4  NEUTROABS 6,642.00 8,226.00  --   --   --   --  9,957.00  HGB 10.8* 13.2   < > 10.6* 8.8* 8.3* 10.0*  HCT 34* 40   < > 34.4* 27.6* 26.1* 34*  MCV  --   --    < > 91.0 89.3 89.7  --   PLT 526* 334   < > 210 177 200 367   < > = values in this interval not displayed.   Lab Results  Component Value Date   TSH 2.176 08/05/2023   Lab Results  Component Value Date   HGBA1C 8.3 07/11/2023   Lab Results   Component Value Date   CHOL 226 (H) 05/08/2022   HDL 62.30 05/08/2022   LDLCALC 137 (H) 05/08/2022   TRIG 133.0 05/08/2022   CHOLHDL 4 05/08/2022    Significant Diagnostic Results in last 30 days:  CT HEAD WO CONTRAST ( ) Result Date: 08/05/2023 CLINICAL DATA:  unwitnessed fall EXAM: CT HEAD WITHOUT CONTRAST TECHNIQUE: Contiguous axial images were obtained from the base of the skull through the vertex without intravenous contrast. RADIATION DOSE REDUCTION: This exam was performed according to the departmental dose-optimization program which includes automated exposure control, adjustment of the mA and/or kV according to patient size and/or use of iterative reconstruction technique. COMPARISON:  CT head February 07, 2023. FINDINGS: Brain: No evidence of acute infarction, hemorrhage, hydrocephalus, extra-axial collection or mass lesion/mass effect. Patchy white matter hypodensities, nonspecific but compatible with chronic microvascular disease. Vascular: No hyperdense vessel or unexpected calcification. Skull: No acute fracture. Sinuses/Orbits: Clear sinuses.  No acute orbital findings. Other: No mastoid effusions. IMPRESSION: No evidence of acute intracranial abnormality. Electronically Signed   By: Stevenson Elbe M.D.   On: 08/05/2023 19:44   US  ARTERIAL ABI (SCREENING LOWER EXTREMITY) Result Date: 08/05/2023 CLINICAL DATA:  Absent pulses in the lower extremities. EXAM: NONINVASIVE PHYSIOLOGIC VASCULAR STUDY OF BILATERAL LOWER EXTREMITIES TECHNIQUE: Evaluation of both lower extremities were performed at rest, including calculation of ankle-brachial indices with single level pressure measurements and doppler recording. COMPARISON:  None available. FINDINGS: Right ABI:  0.93 Left ABI:  1.30 Right Lower Extremity: Posterior tibial and dorsalis pedis artery waveforms are monophasic. Left Lower Extremity: Posterior tibial and dorsalis pedis artery waveforms are monophasic. 0.9-0.99 Borderline PAD  IMPRESSION: At least borderline lower extremity peripheral arterial disease in the right lower extremity. The degree of stenosis may be more significant given abnormal bilateral waveforms. Consider further evaluation with CT angiography lower extremities. Electronically Signed   By: Elester Grim M.D.   On: 08/05/2023 11:26   DG HIP UNILAT WITH PELVIS 2-3 VIEWS LEFT Result Date: 08/04/2023 CLINICAL DATA:  Left intramedullary nail EXAM: DG HIP (WITH OR WITHOUT PELVIS) 2-3V LEFT COMPARISON:  Left hip x-ray 08/04/2023. FINDINGS: Three intraoperative fluoroscopic views of the left hip. Left femoral intramedullary nail and hip screw replaced. Alignment is anatomic as can be seen. IMPRESSION: Left femoral intramedullary nail and hip screw replaced. Electronically Signed   By: Tyron Gallon M.D.   On: 08/04/2023 20:27   DG C-Arm 1-60 Min-No Report Result Date: 08/04/2023 Fluoroscopy was utilized by the requesting physician.  No radiographic interpretation.   DG C-Arm 1-60 Min-No Report Result Date: 08/04/2023 Fluoroscopy was utilized by the requesting physician.  No radiographic interpretation.    Assessment/Plan ABLA (acute blood loss anemia) Hgb  stable at this time, will continue to monitor  Dementia [facility]) Dementia continues to slowly progress no acute changes in cognitive or functional status, continue supportive care.   Diabetes (HCC) Diet controlled, continue to monitor A1c every 6 months  Diastolic CHF with preserved left ventricular function, NYHA class 2 (HCC) Stable at this time, no significant edema,shortness of breath noted Lung sounds clear bilaterally Continues on demadex 10 mg daily   Protein-calorie malnutrition, unspecified severity (HCC) encouraged to liberalize diet. To have protein supplement in addition to smallest meal of the day. Weight has been stable in the last month.    Closed fracture of left hip with routine healing, subsequent encounter On 08/04/2023 s/p IM  fixation  -no pain at this time, will dc morphine  (has not been using) Continue tylenol  PRN  Chronic respiratory failure with hypoxia (HCC) On chronic O2, continues on 2L to maintain sats over 90%   Klaus Casteneda K. Denney Fisherman [facility] [phone]

## 2023-09-04 ENCOUNTER — Encounter: Payer: Self-pay | Admitting: Student

## 2023-09-04 ENCOUNTER — Non-Acute Institutional Stay (SKILLED_NURSING_FACILITY): Payer: Self-pay | Admitting: Student

## 2023-09-04 DIAGNOSIS — Z7189 Other specified counseling: Secondary | ICD-10-CM | POA: Diagnosis not present

## 2023-09-04 DIAGNOSIS — F03C0 Unspecified dementia, severe, without behavioral disturbance, psychotic disturbance, mood disturbance, and anxiety: Secondary | ICD-10-CM

## 2023-09-04 DIAGNOSIS — I5030 Unspecified diastolic (congestive) heart failure: Secondary | ICD-10-CM

## 2023-09-04 DIAGNOSIS — Z515 Encounter for palliative care: Secondary | ICD-10-CM

## 2023-09-04 NOTE — Progress Notes (Unsigned)
 Location:  Other [place].  Nursing Home Room Number: [facility] [address] Place of Service:  SNF [phone]) Provider:  [name], MD  Patient Care Team: [name], MD as PCP - General (Family Medicine) [name], MD as Referring Physician (Ophthalmology) [name], MD as Consulting Physician (Cardiology)  Extended Emergency Contact Information Primary Emergency Contact: [name],[name]  [place] Home Phone: [phone] Relation: Daughter Secondary Emergency Contact: Hensch,[name] Mobile Phone: [phone] Relation: Son  Code Status:  DNR Goals of care: Advanced Directive information    08/05/2023   12:45 AM  Advanced Directives  Does Patient Have a Medical Advance Directive? Yes  Type of Advance Directive Out of facility DNR (pink MOST or yellow form)     Chief Complaint  Patient presents with   Congestive Heart Failure    CHF    HPI:  Pt is a 89 y.o. female seen today for an acute visit for CHF   Past Medical History:  Diagnosis Date   Anemia    treated ~2009 with iron, resolved   Arthritis    Cataract    surgery on R catarct 2012   Chronic systolic CHF (congestive heart failure) (HCC)    a. TTE 2012: EF of 45-50%, mild diffuse HK, trivial AI, mild MR, mildly dilated RV with mild reduction of RVSF, mild to moderate TR, mild to moderate PR, mildly elevated PASP   COPD (chronic obstructive pulmonary disease) (HCC)    Family history of adverse reaction to anesthesia    Mother - altered mental status (long term)   Glaucoma    History of pelvic fracture    Hyperglycemia    mild inc in fasting sugar   Hyperlipidemia    Hypothyroidism    Insomnia    Migraines    without aura, sx since age 89   Osteopenia    prev with 10 years of fosamax and intolerant of evista   PAF (paroxysmal atrial fibrillation) (HCC)    a. initially noted 2012; b. CHADS2VASc at least 5 (CHF, age x 2, vasacular disease, female)   Pulmonary  hypertension (HCC)    Wears dentures    full upper and lower   Past Surgical History:  Procedure Laterality Date   CATARACT EXTRACTION     CATARACT EXTRACTION W/PHACO Left 08/27/2016   Procedure: CATARACT EXTRACTION PHACO AND INTRAOCULAR LENS PLACEMENT (IOC)  Left;  Surgeon: [name], MD;  Location: [facility];  Service: Ophthalmology;  Laterality: Left;   COLONOSCOPY  2012   INTRAMEDULLARY (IM) NAIL INTERTROCHANTERIC Right 02/07/2023   Procedure: INTRAMEDULLARY (IM) NAIL INTERTROCHANTERIC;  Surgeon: Elner Hahn, MD;  Location: [facility];  Service: Orthopedics;  Laterality: Right;   INTRAMEDULLARY (IM) NAIL INTERTROCHANTERIC Left 08/04/2023   Procedure: FIXATION, FRACTURE, INTERTROCHANTERIC, WITH INTRAMEDULLARY ROD;  Surgeon: Marlynn Singer, MD;  Location: [facility];  Service: Orthopedics;  Laterality: Left;   REFRACTIVE SURGERY      Allergies  Allergen Reactions   Codeine     Doesn't remember reaction   Cortisone     Doesn't remember reaction   Decongestant [Pseudoephedrine Hcl] Other (See Comments)    Avoid decongestants due to glaucoma hx.     Donepezil      Prolonged Qt   Naproxen     Stomach issues   Simvastatin      aches   Statins Other (See Comments)    Aches     Outpatient Encounter Medications as of 09/04/2023  Medication Sig   acetaminophen  (TYLENOL ) 325  MG tablet Take 650 mg by mouth every 6 (six) hours as needed.   ipratropium-albuterol  (DUONEB) 0.5-2.5 (3) MG/3ML SOLN Take 3 mLs by nebulization every 6 (six) hours as needed.   lactose free nutrition (BOOST) LIQD Take 237 mLs by mouth 2 (two) times daily.   melatonin 5 MG TABS Take 5 mg by mouth at bedtime as needed.   midodrine  (PROAMATINE ) 10 MG tablet Take 10 mg by mouth 3 (three) times daily.   OXYGEN  Inhale into the lungs. 2lpm as needed.   torsemide (DEMADEX) 10 MG tablet Take 10 mg by mouth daily.   Zinc Oxide (TRIPLE PASTE) 40 % PSTE Apply 1 Application topically as needed (to buttocks  and sacrum).   LORazepam (ATIVAN) 0.5 MG tablet Take 0.5 mg by mouth every 4 (four) hours as needed for anxiety. (Patient not taking: Reported on 09/04/2023)   morphine  20 MG/5ML solution Take 0.25 mLs by mouth every 2 (two) hours as needed for pain. (Patient not taking: Reported on 09/04/2023)   No facility-administered encounter medications on file as of 09/04/2023.    Review of Systems  Immunization History  Administered Date(s) Administered   Influenza Inj Mdck Quad Pf 01/28/2019   Influenza Split 01/25/2011, 01/24/2012, 01/21/2013   Influenza, High Dose Seasonal PF 03/15/2021, 01/08/2022, 03/07/2023   Influenza,inj,Quad PF,6+ Mos 01/14/2017   Influenza-Unspecified 01/20/2014, 01/19/2016, 01/17/2018, 01/22/2020   Moderna Sars-Covid-2 Vaccination 05/06/2019, 06/03/2019, 03/07/2020   PNEUMOCOCCAL CONJUGATE-20 03/11/2023   Pneumococcal Conjugate-13 05/24/2014   Pneumococcal Polysaccharide-23 05/14/2011   Respiratory Syncytial Virus Vaccine,Recomb Aduvanted(Arexvy) 03/11/2023   Tdap 01/07/2012, 02/07/2023   Pertinent  Health Maintenance Due  Topic Date Due   OPHTHALMOLOGY EXAM  Never done   INFLUENZA VACCINE  11/22/2023   HEMOGLOBIN A1C  01/11/2024   FOOT EXAM  07/15/2024   DEXA SCAN  Completed      02/15/2022    1:21 PM 05/08/2022   12:30 PM 06/13/2022    3:40 PM 08/10/2022    3:02 PM 07/16/2023    1:13 PM  Fall Risk  Falls in the past year?  1 0 1 1  Was there an injury with Fall?  0 0 1 1  Fall Risk Category Calculator  2 0 3 2  (RETIRED) Patient Fall Risk Level Low fall risk      Patient at Risk for Falls Due to  History of fall(s);Impaired balance/gait No Fall Risks Impaired balance/gait;Impaired mobility;History of fall(s) History of fall(s)  Fall risk Follow up  Falls evaluation completed Education provided;Falls prevention discussed Falls evaluation completed;Falls prevention discussed    Functional Status Survey:    Vitals:   09/04/23 0910  BP: 109/65  Pulse: 60   Resp: 16  Temp: 97.8 F (36.6 C)  SpO2: 92%  Weight: 119 lb 6.4 oz (54.2 kg)  Height: 5\' 4"  (1.626 m)   Body mass index is 20.49 kg/m. Physical Exam  Labs reviewed: Recent Labs    02/08/23 0455 02/09/23 0631 02/10/23 4401 02/11/23 0352 02/12/23 0349 02/13/23 0932 08/05/23 0435 08/06/23 0434 08/07/23 0322 08/15/23 0000  NA 140 137   < > 140 138   < > 139 138 135 141  K 4.2 4.6   < > 3.4* 3.8   < > 4.3 3.9 4.1 3.6  CL 110 108   < > 104 102   < > 105 106 106 98*  CO2 22 22   < > 27 27   < > 22 23 24  31*  GLUCOSE 96 149*   < >  86 99   < > 234* 86 222*  --   BUN 21 27*   < > 18 16   < > 24* 26* 22 17  CREATININE 1.08* 1.19*   < > 0.60 0.56   < > 1.28* 0.90 0.83 0.7  CALCIUM  8.2* 8.3*   < > 8.0* 8.2*   < > 8.1* 8.3* 8.6* 8.7  MG 1.7 2.3  --  1.9 2.0  --   --   --   --   --   PHOS 4.1 4.4  --   --  2.8  --   --   --   --   --    < > = values in this interval not displayed.   Recent Labs    02/07/23 0814 02/10/23 0656 07/11/23 0000  AST 37 47* 14  ALT 17 7 5*  ALKPHOS 37* 37* 73  BILITOT 0.7 0.9  --   PROT 5.0* 4.9*  --   ALBUMIN 2.8* 2.6* 3.7   Recent Labs    02/21/23 0000 04/11/23 0000 07/11/23 0000 08/05/23 0435 08/06/23 0434 08/07/23 0322 08/15/23 0000  WBC 9.0 10.6   < > 16.0* 11.7* 10.0 12.4  NEUTROABS 6,642.00 8,226.00  --   --   --   --  9,957.00  HGB 10.8* 13.2   < > 10.6* 8.8* 8.3* 10.0*  HCT 34* 40   < > 34.4* 27.6* 26.1* 34*  MCV  --   --    < > 91.0 89.3 89.7  --   PLT 526* 334   < > 210 177 200 367   < > = values in this interval not displayed.   Lab Results  Component Value Date   TSH 2.176 08/05/2023   Lab Results  Component Value Date   HGBA1C 8.3 07/11/2023   Lab Results  Component Value Date   CHOL 226 (H) 05/08/2022   HDL 62.30 05/08/2022   LDLCALC 137 (H) 05/08/2022   TRIG 133.0 05/08/2022   CHOLHDL 4 05/08/2022    Significant Diagnostic Results in last 30 days:  CT HEAD WO CONTRAST ( ) Result Date:  08/05/2023 CLINICAL DATA:  unwitnessed fall EXAM: CT HEAD WITHOUT CONTRAST TECHNIQUE: Contiguous axial images were obtained from the base of the skull through the vertex without intravenous contrast. RADIATION DOSE REDUCTION: This exam was performed according to the departmental dose-optimization program which includes automated exposure control, adjustment of the mA and/or kV according to patient size and/or use of iterative reconstruction technique. COMPARISON:  CT head February 07, 2023. FINDINGS: Brain: No evidence of acute infarction, hemorrhage, hydrocephalus, extra-axial collection or mass lesion/mass effect. Patchy white matter hypodensities, nonspecific but compatible with chronic microvascular disease. Vascular: No hyperdense vessel or unexpected calcification. Skull: No acute fracture. Sinuses/Orbits: Clear sinuses.  No acute orbital findings. Other: No mastoid effusions. IMPRESSION: No evidence of acute intracranial abnormality. Electronically Signed   By: Stevenson Elbe M.D.   On: 08/05/2023 19:44   US  ARTERIAL ABI (SCREENING LOWER EXTREMITY) Result Date: 08/05/2023 CLINICAL DATA:  Absent pulses in the lower extremities. EXAM: NONINVASIVE PHYSIOLOGIC VASCULAR STUDY OF BILATERAL LOWER EXTREMITIES TECHNIQUE: Evaluation of both lower extremities were performed at rest, including calculation of ankle-brachial indices with single level pressure measurements and doppler recording. COMPARISON:  None available. FINDINGS: Right ABI:  0.93 Left ABI:  1.30 Right Lower Extremity: Posterior tibial and dorsalis pedis artery waveforms are monophasic. Left Lower Extremity: Posterior tibial and dorsalis pedis artery waveforms are monophasic. 0.9-0.99 Borderline  PAD IMPRESSION: At least borderline lower extremity peripheral arterial disease in the right lower extremity. The degree of stenosis may be more significant given abnormal bilateral waveforms. Consider further evaluation with CT angiography lower  extremities. Electronically Signed   By: Elester Grim M.D.   On: 08/05/2023 11:26    Assessment/Plan There are no diagnoses linked to this encounter.   Family/ staff Communication: ***  Labs/tests ordered:  ***

## 2023-09-05 ENCOUNTER — Encounter: Payer: Self-pay | Admitting: Student

## 2023-09-09 LAB — BASIC METABOLIC PANEL WITH GFR
BUN: 13 (ref 4–21)
CO2: 31 — AB (ref 13–22)
Chloride: 95 — AB (ref 99–108)
Creatinine: 0.8 (ref 0.5–1.1)
Glucose: 336
Potassium: 3.7 meq/L (ref 3.5–5.1)
Sodium: 138 (ref 137–147)

## 2023-09-09 LAB — COMPREHENSIVE METABOLIC PANEL WITH GFR
Albumin: 3.9 (ref 3.5–5.0)
Calcium: 9.2 (ref 8.7–10.7)
Globulin: 2.6
eGFR: 72

## 2023-09-09 LAB — HEPATIC FUNCTION PANEL
ALT: 8 U/L (ref 7–35)
AST: 18 (ref 13–35)
Alkaline Phosphatase: 134 — AB (ref 25–125)
Bilirubin, Total: 0.6

## 2023-09-12 LAB — CBC: RBC: 4.21 (ref 3.87–5.11)

## 2023-09-12 LAB — CBC AND DIFFERENTIAL
HCT: 39 (ref 36–46)
Hemoglobin: 11.7 — AB (ref 12.0–16.0)
Neutrophils Absolute: 6836
Platelets: 358 10*3/uL (ref 150–400)
WBC: 9.3

## 2023-09-12 LAB — HEMOGLOBIN A1C: Hemoglobin A1C: 8.3

## 2023-09-22 DIAGNOSIS — R0989 Other specified symptoms and signs involving the circulatory and respiratory systems: Secondary | ICD-10-CM | POA: Diagnosis not present

## 2023-09-22 DIAGNOSIS — I517 Cardiomegaly: Secondary | ICD-10-CM | POA: Diagnosis not present

## 2023-09-23 ENCOUNTER — Encounter: Payer: Self-pay | Admitting: Student

## 2023-09-23 ENCOUNTER — Non-Acute Institutional Stay (SKILLED_NURSING_FACILITY): Payer: Self-pay | Admitting: Student

## 2023-09-23 DIAGNOSIS — Z515 Encounter for palliative care: Secondary | ICD-10-CM | POA: Diagnosis not present

## 2023-09-23 DIAGNOSIS — I5030 Unspecified diastolic (congestive) heart failure: Secondary | ICD-10-CM

## 2023-09-23 DIAGNOSIS — J449 Chronic obstructive pulmonary disease, unspecified: Secondary | ICD-10-CM

## 2023-09-23 MED ORDER — MORPHINE SULFATE (CONCENTRATE) 20 MG/ML PO SOLN: ORAL | 0 refills | Status: DC

## 2023-09-23 MED ORDER — LORAZEPAM 0.5 MG PO TABS: ORAL_TABLET | ORAL | 0 refills | Status: DC

## 2023-09-23 NOTE — Progress Notes (Signed)
 Location:  Other [place].  Nursing Home Room Number: [facility] [address] Place of Service:  SNF [phone]) Provider:  [name], MD  Patient Care Team: [name], MD as PCP - General (Family Medicine) [name], MD as Referring Physician (Ophthalmology) [name], MD as Consulting Physician (Cardiology)  Extended Emergency Contact Information Primary Emergency Contact: [name],[name]  [place] Home Phone: [phone] Relation: Daughter Secondary Emergency Contact: Mangieri,[name] Mobile Phone: [phone] Relation: Son  Code Status:  DNR Goals of care: Advanced Directive information    09/23/2023   11:33 AM  Advanced Directives  Does Patient Have a Medical Advance Directive? Yes  Type of Advance Directive Out of facility DNR (pink MOST or yellow form)  Does patient want to make changes to medical advance directive? No - Patient declined     Chief Complaint  Patient presents with   Congestive Heart Failure    Heart Failure.     HPI:  Pt is a 89 y.o. female seen today for an acute visit for Heart Failure History of Present Illness The patient presents with low oxygen  levels and respiratory distress. She is accompanied by her sister.  She has been experiencing low oxygen  levels, with a recent measurement in the sixties. There is a history of bradycardia and low oxygen  levels, which have not shown much improvement with medications. Her current medications include midodrine , taken at a dose of 10 mg three times a day.  She has been experiencing difficulty breathing and has been described as 'out of control' with her breathing. A chest x-ray was performed due to coughing, but it did not show any consolidation or fluid accumulation. There is a mention of a collapsed lung on the x-ray.  She has also been experiencing sleep disturbances, with three nights of poor sleep reported. She was noted to be sleeping heavily and appeared 'a little  weird' upon waking.  Past Medical History:  Diagnosis Date   Anemia    treated ~2009 with iron, resolved   Arthritis    Cataract    surgery on R catarct 2012   Chronic systolic CHF (congestive heart failure) (HCC)    a. TTE 2012: EF of 45-50%, mild diffuse HK, trivial AI, mild MR, mildly dilated RV with mild reduction of RVSF, mild to moderate TR, mild to moderate PR, mildly elevated PASP   COPD (chronic obstructive pulmonary disease) (HCC)    Family history of adverse reaction to anesthesia    Mother - altered mental status (long term)   Glaucoma    History of pelvic fracture    Hyperglycemia    mild inc in fasting sugar   Hyperlipidemia    Hypothyroidism    Insomnia    Migraines    without aura, sx since age 44   Osteopenia    prev with 10 years of fosamax and intolerant of evista   PAF (paroxysmal atrial fibrillation) (HCC)    a. initially noted 2012; b. CHADS2VASc at least 5 (CHF, age x 2, vasacular disease, female)   Pulmonary hypertension (HCC)    Wears dentures    full upper and lower   Past Surgical History:  Procedure Laterality Date   CATARACT EXTRACTION     CATARACT EXTRACTION W/PHACO Left 08/27/2016   Procedure: CATARACT EXTRACTION PHACO AND INTRAOCULAR LENS PLACEMENT (IOC)  Left;  Surgeon: [name], MD;  Location: [facility];  Service: Ophthalmology;  Laterality: Left;   COLONOSCOPY  2012   INTRAMEDULLARY (IM) NAIL INTERTROCHANTERIC Right  02/07/2023   Procedure: INTRAMEDULLARY (IM) NAIL INTERTROCHANTERIC;  Surgeon: Elner Hahn, MD;  Location: [facility];  Service: Orthopedics;  Laterality: Right;   INTRAMEDULLARY (IM) NAIL INTERTROCHANTERIC Left 08/04/2023   Procedure: FIXATION, FRACTURE, INTERTROCHANTERIC, WITH INTRAMEDULLARY ROD;  Surgeon: Marlynn Singer, MD;  Location: [facility];  Service: Orthopedics;  Laterality: Left;   REFRACTIVE SURGERY      Allergies  Allergen Reactions   Codeine     Doesn't remember reaction   Cortisone      Doesn't remember reaction   Decongestant [Pseudoephedrine Hcl] Other (See Comments)    Avoid decongestants due to glaucoma hx.     Donepezil      Prolonged Qt   Naproxen     Stomach issues   Simvastatin      aches   Statins Other (See Comments)    Aches     Outpatient Encounter Medications as of 09/23/2023  Medication Sig   acetaminophen  (TYLENOL ) 325 MG tablet Take 650 mg by mouth every 6 (six) hours as needed.   ipratropium-albuterol  (DUONEB) 0.5-2.5 (3) MG/3ML SOLN Take 3 mLs by nebulization every 6 (six) hours as needed.   lactose free nutrition (BOOST) LIQD Take 237 mLs by mouth 2 (two) times daily.   melatonin 5 MG TABS Take 5 mg by mouth at bedtime as needed.   midodrine  (PROAMATINE ) 10 MG tablet Take 10 mg by mouth 3 (three) times daily.   OXYGEN  Inhale into the lungs. 2lpm as needed.   torsemide (DEMADEX) 10 MG tablet Take 10 mg by mouth daily.   Zinc Oxide (TRIPLE PASTE) 40 % PSTE Apply 1 Application topically as needed (to buttocks and sacrum).   LORazepam (ATIVAN) 0.5 MG tablet Take 0.5 mg by mouth every 4 (four) hours as needed for anxiety. (Patient not taking: Reported on 09/23/2023)   morphine  20 MG/5ML solution Take 0.25 mLs by mouth every 2 (two) hours as needed for pain. (Patient not taking: Reported on 09/23/2023)   No facility-administered encounter medications on file as of 09/23/2023.    Review of Systems  Immunization History  Administered Date(s) Administered   Influenza Inj Mdck Quad Pf 01/28/2019   Influenza Split 01/25/2011, 01/24/2012, 01/21/2013   Influenza, High Dose Seasonal PF 03/15/2021, 01/08/2022, 03/07/2023   Influenza,inj,Quad PF,6+ Mos 01/14/2017   Influenza-Unspecified 01/20/2014, 01/19/2016, 01/17/2018, 01/22/2020   Moderna Sars-Covid-2 Vaccination 05/06/2019, 06/03/2019, 03/07/2020   PNEUMOCOCCAL CONJUGATE-20 03/11/2023   Pneumococcal Conjugate-13 05/24/2014   Pneumococcal Polysaccharide-23 05/14/2011   Respiratory Syncytial Virus  Vaccine,Recomb Aduvanted(Arexvy) 03/11/2023   Tdap 01/07/2012, 02/07/2023   Pertinent  Health Maintenance Due  Topic Date Due   OPHTHALMOLOGY EXAM  Never done   INFLUENZA VACCINE  11/22/2023   HEMOGLOBIN A1C  03/14/2024   FOOT EXAM  07/15/2024   DEXA SCAN  Completed      02/15/2022    1:21 PM 05/08/2022   12:30 PM 06/13/2022    3:40 PM 08/10/2022    3:02 PM 07/16/2023    1:13 PM  Fall Risk  Falls in the past year?  1 0 1 1  Was there an injury with Fall?  0 0 1 1  Fall Risk Category Calculator  2 0 3 2  (RETIRED) Patient Fall Risk Level Low fall risk      Patient at Risk for Falls Due to  History of fall(s);Impaired balance/gait No Fall Risks Impaired balance/gait;Impaired mobility;History of fall(s) History of fall(s)  Fall risk Follow up  Falls evaluation completed Education provided;Falls prevention discussed Falls evaluation completed;Falls prevention  discussed    Functional Status Survey:    Vitals:   09/23/23 1119  BP: 118/74  Pulse: 86  Resp: 18  Temp: 97.9 F (36.6 C)  SpO2: 92%  Weight: 128 lb 6.4 oz (58.2 kg)  Height: 5\' 4"  (1.626 m)   Body mass index is 22.04 kg/m. Physical Exam Constitutional:      Comments: Thin, frail, wearing supplemental oxygen   Cardiovascular:     Pulses: Normal pulses.     Heart sounds:     Gallop present.  Pulmonary:     Effort: Pulmonary effort is normal.  Neurological:     General: No focal deficit present.     Mental Status: She is alert.     Labs reviewed: Recent Labs    02/08/23 0455 02/09/23 0631 02/10/23 0656 02/11/23 0352 02/12/23 0349 02/13/23 0932 08/05/23 0435 08/06/23 0434 08/07/23 0322 08/15/23 0000 09/09/23 0000  NA 140 137   < > 140 138   < > 139 138 135 141 138  K 4.2 4.6   < > 3.4* 3.8   < > 4.3 3.9 4.1 3.6 3.7  CL 110 108   < > 104 102   < > 105 106 106 98* 95*  CO2 22 22   < > 27 27   < > 22 23 24  31* 31*  GLUCOSE 96 149*   < > 86 99   < > 234* 86 222*  --   --   BUN 21 27*   < > 18 16   < >  24* 26* 22 17 13   CREATININE 1.08* 1.19*   < > 0.60 0.56   < > 1.28* 0.90 0.83 0.7 0.8  CALCIUM  8.2* 8.3*   < > 8.0* 8.2*   < > 8.1* 8.3* 8.6* 8.7 9.2  MG 1.7 2.3  --  1.9 2.0  --   --   --   --   --   --   PHOS 4.1 4.4  --   --  2.8  --   --   --   --   --   --    < > = values in this interval not displayed.   Recent Labs    02/07/23 0814 02/10/23 0656 07/11/23 0000 09/09/23 0000  AST 37 47* 14 18  ALT 17 7 5* 8  ALKPHOS 37* 37* 73 134*  BILITOT 0.7 0.9  --   --   PROT 5.0* 4.9*  --   --   ALBUMIN 2.8* 2.6* 3.7 3.9   Recent Labs    04/11/23 0000 07/11/23 0000 08/05/23 0435 08/06/23 0434 08/07/23 0322 08/15/23 0000 09/12/23 0000  WBC 10.6   < > 16.0* 11.7* 10.0 12.4 9.3  NEUTROABS 8,226.00  --   --   --   --  9,957.00 6,836.00  HGB 13.2   < > 10.6* 8.8* 8.3* 10.0* 11.7*  HCT 40   < > 34.4* 27.6* 26.1* 34* 39  MCV  --    < > 91.0 89.3 89.7  --   --   PLT 334   < > 210 177 200 367 358   < > = values in this interval not displayed.   Lab Results  Component Value Date   TSH 2.176 08/05/2023   Lab Results  Component Value Date   HGBA1C 8.3 09/12/2023   Lab Results  Component Value Date   CHOL 226 (H) 05/08/2022   HDL 62.30 05/08/2022   LDLCALC  137 (H) 05/08/2022   TRIG 133.0 05/08/2022   CHOLHDL 4 05/08/2022    Significant Diagnostic Results in last 30 days:  RADIOLOGY Chest x-ray: No consolidation, no pleural effusion, cardiomegaly, apical lung collapse (09/07/2023)  Assessment/Plan Enlarged heart Likely Dilated cardiomyopathy evident on chest x-ray vs pericardial effusion with an enlarged heart compared to previous imaging from 4 weeks ago which is likely  contributing to hypoxemia and bradycardia. Given her end-of-life care goals, aggressive treatment is not pursued. Prognosis is poor, with an expected timeframe of days to weeks, potentially less than a week given current oxygen  levels and comfort medication use. - Ensure comfort measures are in place. -  Administer morphine  scheduled every 4 hours and as needd hourly.  - Schedule Ativan at night to aid sleep and provide throughout the day q8hrs prn for 14 days.   Hypoxemia Hypoxemia likely due to dilated cardiomyopathy and possible lung collapse. Oxygen  was administered previously, but the focus remains on comfort care. - Administer oxygen  as needed for comfort. - Ensure morphine  is available to manage any respiratory discomfort.  Possible apical lung collapse Possible apical lung collapse noted on chest x-ray. No clear consolidation or pleural effusion observed. The collapse may be related to hypoxemia and dilated cardiomyopathy. No antibiotics are indicated as there is no evidence of infection. - Ensure comfort measures.  Bradycardia Bradycardia likely related to dilated cardiomyopathy and hypoxemia. Managed with comfort measures in line with end-of-life care goals. - Ensure comfort measures.  Goals of Care Her goals of care focus on comfort and avoiding life-prolonging interventions. The family understands the prognosis and prioritizes comfort measures over aggressive treatment. The use of comfort medications like morphine  and Ativan aligns with these goals. - Adjust medications as needed to maintain comfort.  Family/ staff Communication: nursing, daughter Clarnce Crow ordered:  none

## 2023-09-27 ENCOUNTER — Encounter: Payer: Self-pay | Admitting: Student

## 2023-09-27 ENCOUNTER — Non-Acute Institutional Stay (SKILLED_NURSING_FACILITY): Payer: Self-pay | Admitting: Student

## 2023-09-27 DIAGNOSIS — R630 Anorexia: Secondary | ICD-10-CM | POA: Diagnosis not present

## 2023-09-27 DIAGNOSIS — Z515 Encounter for palliative care: Secondary | ICD-10-CM

## 2023-09-27 DIAGNOSIS — I5023 Acute on chronic systolic (congestive) heart failure: Secondary | ICD-10-CM | POA: Diagnosis not present

## 2023-09-27 NOTE — Progress Notes (Signed)
 Location:  Other [place].  Nursing Home Room Number: [facility] [address] Place of Service:  SNF [phone]) Provider:  [name], MD  Patient Care Team: [name], MD as PCP - General (Family Medicine) [name], MD as Referring Physician (Ophthalmology) [name], MD as Consulting Physician (Cardiology)  Extended Emergency Contact Information Primary Emergency Contact: [name],[name]  [place] Home Phone: [phone] Relation: Daughter Secondary Emergency Contact: Delany,[name] Mobile Phone: [phone] Relation: Son  Code Status:  DNR Goals of care: Advanced Directive information    09/23/2023   11:33 AM  Advanced Directives  Does Patient Have a Medical Advance Directive? Yes  Type of Advance Directive Out of facility DNR (pink MOST or yellow form)  Does patient want to make changes to medical advance directive? No - Patient declined     Chief Complaint  Patient presents with   Hospice    Hospice    HPI:  Pt is a 89 y.o. female seen today for Hospice.  History of Present Illness The patient, with atrial fibrillation, presents with respiratory issues and decreased oral intake. She is accompanied by her daughter, who is her primary caregiver.  The patient has been experiencing respiratory issues, including episodes of extra phlegm that is slightly bloody. She uses oxygen  to aid her breathing and is on medication to manage her symptoms, which seems to be effective. There is no need for additional medication for secretions or agitation at this time.  She has a history of atrial fibrillation and experiences a gallop rhythm due to heart stretching. Her circulation remains adequate with good pulses down to the feet.  Her oral intake has decreased, with her daughter noting that she only nibbles on small amounts of food such as antipneumocobites and had a swallow of tea. Her appetite has been inconsistent, with some days being better  than others. Her daughter mentioned that she ate well the previous morning but has been eating less overall.  Her daughter is concerned about her declining condition, noting that she appears weaker and less interactive with each visit. Morphine  was given earlier than scheduled, which helped manage her symptoms.    Past Medical History:  Diagnosis Date   Anemia    treated ~2009 with iron, resolved   Arthritis    Cataract    surgery on R catarct 2012   Chronic systolic CHF (congestive heart failure) (HCC)    a. TTE 2012: EF of 45-50%, mild diffuse HK, trivial AI, mild MR, mildly dilated RV with mild reduction of RVSF, mild to moderate TR, mild to moderate PR, mildly elevated PASP   COPD (chronic obstructive pulmonary disease) (HCC)    Family history of adverse reaction to anesthesia    Mother - altered mental status (long term)   Glaucoma    History of pelvic fracture    Hyperglycemia    mild inc in fasting sugar   Hyperlipidemia    Hypothyroidism    Insomnia    Migraines    without aura, sx since age 60   Osteopenia    prev with 10 years of fosamax and intolerant of evista   PAF (paroxysmal atrial fibrillation) (HCC)    a. initially noted 2012; b. CHADS2VASc at least 5 (CHF, age x 2, vasacular disease, female)   Pulmonary hypertension (HCC)    Wears dentures    full upper and lower   Past Surgical History:  Procedure Laterality Date   CATARACT EXTRACTION     CATARACT EXTRACTION W/PHACO  Left 08/27/2016   Procedure: CATARACT EXTRACTION PHACO AND INTRAOCULAR LENS PLACEMENT (IOC)  Left;  Surgeon: [name], MD;  Location: [facility];  Service: Ophthalmology;  Laterality: Left;   COLONOSCOPY  2012   INTRAMEDULLARY (IM) NAIL INTERTROCHANTERIC Right 02/07/2023   Procedure: INTRAMEDULLARY (IM) NAIL INTERTROCHANTERIC;  Surgeon: Elner Hahn, MD;  Location: [facility];  Service: Orthopedics;  Laterality: Right;   INTRAMEDULLARY (IM) NAIL INTERTROCHANTERIC Left  08/04/2023   Procedure: FIXATION, FRACTURE, INTERTROCHANTERIC, WITH INTRAMEDULLARY ROD;  Surgeon: Marlynn Singer, MD;  Location: [facility];  Service: Orthopedics;  Laterality: Left;   REFRACTIVE SURGERY      Allergies  Allergen Reactions   Codeine     Doesn't remember reaction   Cortisone     Doesn't remember reaction   Decongestant [Pseudoephedrine Hcl] Other (See Comments)    Avoid decongestants due to glaucoma hx.     Donepezil      Prolonged Qt   Naproxen     Stomach issues   Simvastatin      aches   Statins Other (See Comments)    Aches     Outpatient Encounter Medications as of 09/27/2023  Medication Sig   acetaminophen  (TYLENOL ) 325 MG tablet Take 650 mg by mouth every 6 (six) hours as needed.   ipratropium-albuterol  (DUONEB) 0.5-2.5 (3) MG/3ML SOLN Take 3 mLs by nebulization every 6 (six) hours as needed.   lactose free nutrition (BOOST) LIQD Take 237 mLs by mouth 2 (two) times daily.   LORazepam  (ATIVAN ) 0.5 MG tablet Take 1 tablet (0.5 mg total) by mouth at bedtime. May also take 1 tablet (0.5 mg total) every 8 (eight) hours as needed for anxiety (shortness of breath, agitation). (Patient taking differently: Take 1 tablet (0.5 mg total) by mouth at bedtime. May also take 1 tablet (0.5 mg total) every 4 (eight) hours as needed for anxiety (shortness of breath, agitation).)   midodrine  (PROAMATINE ) 10 MG tablet Take 5 mg by mouth 3 (three) times daily.   morphine  (ROXANOL) 20 MG/ML concentrated solution Take 0.25 mLs (5 mg total) by mouth every 4 (four) hours. May also take 0.25 mLs (5 mg total) every hour as needed for severe pain (pain score 7-10), breakthrough pain, anxiety or shortness of breath (air hunger).   OXYGEN  Inhale into the lungs. 2lpm as needed.   torsemide (DEMADEX) 10 MG tablet Take 10 mg by mouth daily.   Zinc Oxide (TRIPLE PASTE) 40 % PSTE Apply 1 Application topically as needed (to buttocks and sacrum).   melatonin 5 MG TABS Take 5 mg by mouth at bedtime as  needed. (Patient not taking: Reported on 09/27/2023)   No facility-administered encounter medications on file as of 09/27/2023.    Review of Systems  Immunization History  Administered Date(s) Administered   Influenza Inj Mdck Quad Pf 01/28/2019   Influenza Split 01/25/2011, 01/24/2012, 01/21/2013   Influenza, High Dose Seasonal PF 03/15/2021, 01/08/2022, 03/07/2023   Influenza,inj,Quad PF,6+ Mos 01/14/2017   Influenza-Unspecified 01/20/2014, 01/19/2016, 01/17/2018, 01/22/2020   Moderna Sars-Covid-2 Vaccination 05/06/2019, 06/03/2019, 03/07/2020   PNEUMOCOCCAL CONJUGATE-20 03/11/2023   Pneumococcal Conjugate-13 05/24/2014   Pneumococcal Polysaccharide-23 05/14/2011   Respiratory Syncytial Virus Vaccine,Recomb Aduvanted(Arexvy) 03/11/2023   Tdap 01/07/2012, 02/07/2023   Pertinent  Health Maintenance Due  Topic Date Due   OPHTHALMOLOGY EXAM  Never done   INFLUENZA VACCINE  11/22/2023   HEMOGLOBIN A1C  03/14/2024   FOOT EXAM  07/15/2024   DEXA SCAN  Completed      02/15/2022  1:21 PM 05/08/2022   12:30 PM 06/13/2022    3:40 PM 08/10/2022    3:02 PM 07/16/2023    1:13 PM  Fall Risk  Falls in the past year?  1 0 1 1  Was there an injury with Fall?  0 0 1 1  Fall Risk Category Calculator  2 0 3 2  (RETIRED) Patient Fall Risk Level Low fall risk      Patient at Risk for Falls Due to  History of fall(s);Impaired balance/gait No Fall Risks Impaired balance/gait;Impaired mobility;History of fall(s) History of fall(s)  Fall risk Follow up  Falls evaluation completed Education provided;Falls prevention discussed Falls evaluation completed;Falls prevention discussed    Functional Status Survey:    Vitals:   09/27/23 1327  BP: 101/63  Pulse: (!) 123  Resp: 16  Temp: (!) 97.4 F (36.3 C)  SpO2: 95%  Weight: 124 lb (56.2 kg)  Height: 5\' 4"  (1.626 m)   Body mass index is 21.28 kg/m. Physical Exam  CHEST: Lungs clear to auscultation. CARDIOVASCULAR: Atrial fibrillation present.  Peripheral pulses intact. EXTREMITIES: Strong peripheral pulses in feet. Labs reviewed: Recent Labs    02/08/23 0455 02/09/23 0631 02/10/23 0656 02/11/23 0352 02/12/23 0349 02/13/23 0932 08/05/23 0435 08/06/23 0434 08/07/23 0322 08/15/23 0000 09/09/23 0000  NA 140 137   < > 140 138   < > 139 138 135 141 138  K 4.2 4.6   < > 3.4* 3.8   < > 4.3 3.9 4.1 3.6 3.7  CL 110 108   < > 104 102   < > 105 106 106 98* 95*  CO2 22 22   < > 27 27   < > 22 23 24  31* 31*  GLUCOSE 96 149*   < > 86 99   < > 234* 86 222*  --   --   BUN 21 27*   < > 18 16   < > 24* 26* 22 17 13   CREATININE 1.08* 1.19*   < > 0.60 0.56   < > 1.28* 0.90 0.83 0.7 0.8  CALCIUM  8.2* 8.3*   < > 8.0* 8.2*   < > 8.1* 8.3* 8.6* 8.7 9.2  MG 1.7 2.3  --  1.9 2.0  --   --   --   --   --   --   PHOS 4.1 4.4  --   --  2.8  --   --   --   --   --   --    < > = values in this interval not displayed.   Recent Labs    02/07/23 0814 02/10/23 0656 07/11/23 0000 09/09/23 0000  AST 37 47* 14 18  ALT 17 7 5* 8  ALKPHOS 37* 37* 73 134*  BILITOT 0.7 0.9  --   --   PROT 5.0* 4.9*  --   --   ALBUMIN 2.8* 2.6* 3.7 3.9   Recent Labs    04/11/23 0000 07/11/23 0000 08/05/23 0435 08/06/23 0434 08/07/23 0322 08/15/23 0000 09/12/23 0000  WBC 10.6   < > 16.0* 11.7* 10.0 12.4 9.3  NEUTROABS 8,226.00  --   --   --   --  9,957.00 6,836.00  HGB 13.2   < > 10.6* 8.8* 8.3* 10.0* 11.7*  HCT 40   < > 34.4* 27.6* 26.1* 34* 39  MCV  --    < > 91.0 89.3 89.7  --   --   PLT 334   < >  210 177 200 367 358   < > = values in this interval not displayed.   Lab Results  Component Value Date   TSH 2.176 08/05/2023   Lab Results  Component Value Date   HGBA1C 8.3 09/12/2023   Lab Results  Component Value Date   CHOL 226 (H) 05/08/2022   HDL 62.30 05/08/2022   LDLCALC 137 (H) 05/08/2022   TRIG 133.0 05/08/2022   CHOLHDL 4 05/08/2022    Significant Diagnostic Results in last 30 days:  No results found.  Assessment/Plan Respiratory  issues with phlegm Respiratory issues with occasional hemoptysis. Lungs are clear on examination. Current management is effective. Oral drops for secretion management are preferred over a patch due to shorter duration of side effects and lack of prolonged sedation. - Consider oral drops for secretion management as needed.  Oxygen  therapy dependence Continues to require oxygen  therapy for comfort and adequate oxygenation.  Atrial fibrillation Chronic atrial fibrillation contributing to irregular heart rhythm. Managed with current medications. Slowing her breathing aids in comfort and oxygenation.  Decreased oral intake Decreased oral intake with reduced appetite and minimal food consumption, contributing to weakness and potential impact on renal function. Monitored closely as it may indicate a decline in her condition.  Goals of Care Family is aware of prognosis and adopts a day-by-day approach. They understand her declining condition, evidenced by decreased oral intake and increased weakness, and are prepared for potential increased medication needs for comfort. - Provide pamphlet on skin changes and signs of decline. - Monitor for increased need for medications such as morphine  or Ativan  for comfort.   Family/ staff Communication: nursing  Labs/tests ordered:  none

## 2023-09-28 ENCOUNTER — Encounter: Payer: Self-pay | Admitting: Student

## 2023-10-02 ENCOUNTER — Encounter: Payer: Self-pay | Admitting: Student

## 2023-10-02 ENCOUNTER — Non-Acute Institutional Stay (SKILLED_NURSING_FACILITY): Admitting: Student

## 2023-10-02 DIAGNOSIS — I5030 Unspecified diastolic (congestive) heart failure: Secondary | ICD-10-CM | POA: Diagnosis not present

## 2023-10-02 DIAGNOSIS — Z515 Encounter for palliative care: Secondary | ICD-10-CM | POA: Diagnosis not present

## 2023-10-02 DIAGNOSIS — F03C0 Unspecified dementia, severe, without behavioral disturbance, psychotic disturbance, mood disturbance, and anxiety: Secondary | ICD-10-CM | POA: Diagnosis not present

## 2023-10-02 DIAGNOSIS — E46 Unspecified protein-calorie malnutrition: Secondary | ICD-10-CM

## 2023-10-02 DIAGNOSIS — E1169 Type 2 diabetes mellitus with other specified complication: Secondary | ICD-10-CM

## 2023-10-02 DIAGNOSIS — J449 Chronic obstructive pulmonary disease, unspecified: Secondary | ICD-10-CM

## 2023-10-02 MED ORDER — MORPHINE SULFATE (CONCENTRATE) 20 MG/ML PO SOLN
ORAL | 0 refills | Status: AC
Start: 2023-10-02 — End: ?

## 2023-10-02 MED ORDER — LORAZEPAM 0.5 MG PO TABS
ORAL_TABLET | ORAL | 0 refills | Status: AC
Start: 2023-10-02 — End: ?

## 2023-10-02 NOTE — Progress Notes (Signed)
 Location:  Other [place].  Nursing Home Room Number: [facility]. [address] [employer] of Service:  SNF [phone]) Provider:  [name], MD  Patient Care Team: [name], MD as PCP - General (Family Medicine) [name], MD as Referring Physician (Ophthalmology) [name], MD as Consulting Physician (Cardiology)  Extended Emergency Contact Information Primary Emergency Contact: [name],[name]  [place] Home Phone: [phone] Relation: Daughter Secondary Emergency Contact: [place],[name] Mobile Phone: [phone] Relation: Son  Code Status:  DNR Goals of care: Advanced Directive information    09/23/2023   11:33 AM  Advanced Directives  Does Patient Have a Medical Advance Directive? Yes  Type of Advance Directive Out of facility DNR (pink MOST or yellow form)  Does patient want to make changes to medical advance directive? No - Patient declined     Chief Complaint  Patient presents with   End Of Life    End Of Life.     HPI:  Pt is a 89 y.o. female seen today for End of Life Care.  She states she is doing just fine  Nursing state family would like to discontinue midodrine  and adjust the dosing frequency of morphine  to q6 hours.   Nursing also note skin breakdown on the gluteal cleft.    Past Medical History:  Diagnosis Date   Anemia    treated ~2009 with iron, resolved   Arthritis    Cataract    surgery on R catarct 2012   Chronic systolic CHF (congestive heart failure) (HCC)    a. TTE 2012: EF of 45-50%, mild diffuse HK, trivial AI, mild MR, mildly dilated RV with mild reduction of RVSF, mild to moderate TR, mild to moderate PR, mildly elevated PASP   COPD (chronic obstructive pulmonary disease) (HCC)    Family history of adverse reaction to anesthesia    Mother - altered mental status (long term)   Glaucoma    History of pelvic fracture    Hyperglycemia    mild inc in fasting sugar   Hyperlipidemia     Hypothyroidism    Insomnia    Migraines    without aura, sx since age 28   Osteopenia    prev with 10 years of fosamax and intolerant of evista   PAF (paroxysmal atrial fibrillation) (HCC)    a. initially noted 2012; b. CHADS2VASc at least 5 (CHF, age x 2, vasacular disease, female)   Pulmonary hypertension (HCC)    Wears dentures    full upper and lower   Past Surgical History:  Procedure Laterality Date   CATARACT EXTRACTION     CATARACT EXTRACTION W/PHACO Left 08/27/2016   Procedure: CATARACT EXTRACTION PHACO AND INTRAOCULAR LENS PLACEMENT (IOC)  Left;  Surgeon: [name], MD;  Location: [facility];  Service: Ophthalmology;  Laterality: Left;   COLONOSCOPY  2012   INTRAMEDULLARY (IM) NAIL INTERTROCHANTERIC Right 02/07/2023   Procedure: INTRAMEDULLARY (IM) NAIL INTERTROCHANTERIC;  Surgeon: Elner Hahn, MD;  Location: [facility];  Service: Orthopedics;  Laterality: Right;   INTRAMEDULLARY (IM) NAIL INTERTROCHANTERIC Left 08/04/2023   Procedure: FIXATION, FRACTURE, INTERTROCHANTERIC, WITH INTRAMEDULLARY ROD;  Surgeon: Marlynn Singer, MD;  Location: [facility];  Service: Orthopedics;  Laterality: Left;   REFRACTIVE SURGERY      Allergies  Allergen Reactions   Codeine     Doesn't remember reaction   Cortisone     Doesn't remember reaction   Decongestant [Pseudoephedrine Hcl] Other (See Comments)    Avoid decongestants due to  glaucoma hx.     Donepezil      Prolonged Qt   Naproxen     Stomach issues   Simvastatin      aches   Statins Other (See Comments)    Aches     Outpatient Encounter Medications as of 10/02/2023  Medication Sig   acetaminophen  (TYLENOL ) 325 MG tablet Take 650 mg by mouth every 6 (six) hours as needed.   ipratropium-albuterol  (DUONEB) 0.5-2.5 (3) MG/3ML SOLN Take 3 mLs by nebulization every 6 (six) hours as needed.   lactose free nutrition (BOOST) LIQD Take 237 mLs by mouth 2 (two) times daily.   LORazepam  (ATIVAN ) 0.5 MG tablet Take 1  tablet (0.5 mg total) by mouth at bedtime. May also take 1 tablet (0.5 mg total) every 8 (eight) hours as needed for anxiety (shortness of breath, agitation). (Patient taking differently: Take 1 tablet (0.5 mg total) by mouth at bedtime. May also take 1 tablet (0.5 mg total) every 4 (eight) hours as needed for anxiety (shortness of breath, agitation).)   midodrine  (PROAMATINE ) 10 MG tablet Take 5 mg by mouth 3 (three) times daily.   morphine  (ROXANOL) 20 MG/ML concentrated solution Take 0.25 mLs (5 mg total) by mouth every 4 (four) hours. May also take 0.25 mLs (5 mg total) every hour as needed for severe pain (pain score 7-10), breakthrough pain, anxiety or shortness of breath (air hunger).   OXYGEN  Inhale into the lungs. 2lpm as needed.   torsemide (DEMADEX) 10 MG tablet Take 10 mg by mouth daily.   Zinc Oxide (TRIPLE PASTE) 40 % PSTE Apply 1 Application topically as needed (to buttocks and sacrum).   melatonin 5 MG TABS Take 5 mg by mouth at bedtime as needed. (Patient not taking: Reported on 09/27/2023)   No facility-administered encounter medications on file as of 10/02/2023.    Review of Systems  Immunization History  Administered Date(s) Administered   Influenza Inj Mdck Quad Pf 01/28/2019   Influenza Split 01/25/2011, 01/24/2012, 01/21/2013   Influenza, High Dose Seasonal PF 03/15/2021, 01/08/2022, 03/07/2023   Influenza,inj,Quad PF,6+ Mos 01/14/2017   Influenza-Unspecified 01/20/2014, 01/19/2016, 01/17/2018, 01/22/2020   Moderna Sars-Covid-2 Vaccination 05/06/2019, 06/03/2019, 03/07/2020   PNEUMOCOCCAL CONJUGATE-20 03/11/2023   Pneumococcal Conjugate-13 05/24/2014   Pneumococcal Polysaccharide-23 05/14/2011   Respiratory Syncytial Virus Vaccine,Recomb Aduvanted(Arexvy) 03/11/2023   Tdap 01/07/2012, 02/07/2023   Pertinent  Health Maintenance Due  Topic Date Due   OPHTHALMOLOGY EXAM  Never done   INFLUENZA VACCINE  11/22/2023   HEMOGLOBIN A1C  03/14/2024   FOOT EXAM  07/15/2024    DEXA SCAN  Completed      02/15/2022    1:21 PM 05/08/2022   12:30 PM 06/13/2022    3:40 PM 08/10/2022    3:02 PM 07/16/2023    1:13 PM  Fall Risk  Falls in the past year?  1 0 1 1  Was there an injury with Fall?  0 0 1 1  Fall Risk Category Calculator  2 0 3 2  (RETIRED) Patient Fall Risk Level Low fall risk      Patient at Risk for Falls Due to  History of fall(s);Impaired balance/gait No Fall Risks Impaired balance/gait;Impaired mobility;History of fall(s) History of fall(s)  Fall risk Follow up  Falls evaluation completed Education provided;Falls prevention discussed Falls evaluation completed;Falls prevention discussed    Functional Status Survey:    Vitals:   10/02/23 0940  BP: 110/66  Pulse: 83  Resp: 16  Temp: 97.8 F (36.6 C)  SpO2:  92%  Weight: 124 lb (56.2 kg)  Height: 5' 4 (1.626 m)   Body mass index is 21.28 kg/m. Physical Exam Pulmonary:     Comments: Increased work of breathing Genitourinary:    Comments: Sheering of the skin on the sacrum, small stage 2 pressure injury of the left gluteal cleft Skin:    General: Skin is warm and dry.  Neurological:     General: No focal deficit present.     Mental Status: She is alert.     Labs reviewed: Recent Labs    02/08/23 0455 02/09/23 0631 02/10/23 0656 02/11/23 0352 02/12/23 0349 02/13/23 0932 08/05/23 0435 08/06/23 0434 08/07/23 0322 08/15/23 0000 09/09/23 0000  NA 140 137   < > 140 138   < > 139 138 135 141 138  K 4.2 4.6   < > 3.4* 3.8   < > 4.3 3.9 4.1 3.6 3.7  CL 110 108   < > 104 102   < > 105 106 106 98* 95*  CO2 22 22   < > 27 27   < > 22 23 24  31* 31*  GLUCOSE 96 149*   < > 86 99   < > 234* 86 222*  --   --   BUN 21 27*   < > 18 16   < > 24* 26* 22 17 13   CREATININE 1.08* 1.19*   < > 0.60 0.56   < > 1.28* 0.90 0.83 0.7 0.8  CALCIUM  8.2* 8.3*   < > 8.0* 8.2*   < > 8.1* 8.3* 8.6* 8.7 9.2  MG 1.7 2.3  --  1.9 2.0  --   --   --   --   --   --   PHOS 4.1 4.4  --   --  2.8  --   --   --   --    --   --    < > = values in this interval not displayed.   Recent Labs    02/07/23 0814 02/10/23 0656 07/11/23 0000 09/09/23 0000  AST 37 47* 14 18  ALT 17 7 5* 8  ALKPHOS 37* 37* 73 134*  BILITOT 0.7 0.9  --   --   PROT 5.0* 4.9*  --   --   ALBUMIN 2.8* 2.6* 3.7 3.9   Recent Labs    04/11/23 0000 07/11/23 0000 08/05/23 0435 08/06/23 0434 08/07/23 0322 08/15/23 0000 09/12/23 0000  WBC 10.6   < > 16.0* 11.7* 10.0 12.4 9.3  NEUTROABS 8,226.00  --   --   --   --  9,957.00 6,836.00  HGB 13.2   < > 10.6* 8.8* 8.3* 10.0* 11.7*  HCT 40   < > 34.4* 27.6* 26.1* 34* 39  MCV  --    < > 91.0 89.3 89.7  --   --   PLT 334   < > 210 177 200 367 358   < > = values in this interval not displayed.   Lab Results  Component Value Date   TSH 2.176 08/05/2023   Lab Results  Component Value Date   HGBA1C 8.3 09/12/2023   Lab Results  Component Value Date   CHOL 226 (H) 05/08/2022   HDL 62.30 05/08/2022   LDLCALC 137 (H) 05/08/2022   TRIG 133.0 05/08/2022   CHOLHDL 4 05/08/2022    Significant Diagnostic Results in last 30 days:  No results found.  Assessment/Plan Diastolic CHF with preserved left ventricular function,  NYHA class 2 (HCC)  Hospice care - Plan: morphine  (ROXANOL) 20 MG/ML concentrated solution, LORazepam  (ATIVAN ) 0.5 MG tablet  Protein-calorie malnutrition, unspecified severity (HCC)  Severe dementia, unspecified dementia type, unspecified whether behavioral, psychotic, or mood disturbance or anxiety (HCC)  Chronic obstructive pulmonary disease, unspecified COPD type (HCC)  Type 2 diabetes mellitus with other specified complication, unspecified whether long term insulin  use (HCC) Patient with history of CHF and COPD who has had continued decline in the last few months. Partially secondary to poor PO intake. Now requires supplemental oxygen . She continues to have increased work of breathing. Evidence of decline with continued skin breakdown on buttocks. Will  offload weight as tolerated and continue comfort-focused interventions for patient. Por food and protein intake for many weeks likely contributing to wound formation. No longer monitoring glucose for comfort.   Family/ staff Communication: nursing  Labs/tests ordered:  none

## 2023-10-07 ENCOUNTER — Non-Acute Institutional Stay (SKILLED_NURSING_FACILITY): Payer: Self-pay | Admitting: Student

## 2023-10-07 ENCOUNTER — Encounter: Payer: Self-pay | Admitting: Student

## 2023-10-07 DIAGNOSIS — Z515 Encounter for palliative care: Secondary | ICD-10-CM

## 2023-10-07 DIAGNOSIS — I5030 Unspecified diastolic (congestive) heart failure: Secondary | ICD-10-CM | POA: Diagnosis not present

## 2023-10-07 DIAGNOSIS — I480 Paroxysmal atrial fibrillation: Secondary | ICD-10-CM

## 2023-10-07 DIAGNOSIS — F0390 Unspecified dementia without behavioral disturbance: Secondary | ICD-10-CM

## 2023-10-07 MED ORDER — MORPHINE SULFATE (CONCENTRATE) 20 MG/ML PO SOLN: ORAL | 0 refills | Status: DC

## 2023-10-07 MED ORDER — LORAZEPAM 0.5 MG PO TABS: 0.5000 mg | ORAL_TABLET | ORAL | 0 refills | Status: DC

## 2023-10-07 NOTE — Progress Notes (Signed)
 Location:  Other Nursing Home Room Number: [name] [address] Place of Service:  SNF [phone]) Provider:  Alver Jobs, Lisa Rideau, MD  Patient Care Team: [name], MD as PCP - General (Family Medicine) [name], MD as Referring Physician (Ophthalmology) [name], MD as Consulting Physician (Cardiology)  Extended Emergency Contact Information Primary Emergency Contact: [name],[name]  [place] Home Phone: [phone] Relation: Daughter Secondary Emergency Contact: [name],[name] Mobile Phone: [phone] Relation: Son  Code Status:  dnr Goals of care: Advanced Directive information    09/23/2023   11:33 AM  Advanced Directives  Does Patient Have a Medical Advance Directive? Yes  Type of Advance Directive Out of facility DNR (pink MOST or yellow form)  Does patient want to make changes to medical advance directive? No - Patient declined     Chief Complaint  Patient presents with   Hospice Care    HPI:  Pt is a 89 y.o. female seen today for an acute visit for  Discussed the use of AI scribe software for clinical note transcription with the patient, who gave verbal consent to proceed.  History of Present Illness  History of Present Illness The patient, with atrial fibrillation, heart failure, and dementia, presents with increased agitation and shortness of breath.  She is experiencing increased agitation and shortness of breath following the discontinuation of midodrine . These symptoms have developed over a few hours and are affecting her comfort.  She has a history of atrial fibrillation, heart failure, and dementia. Currently, she is on comfort measures only and does not consistently use supplemental oxygen .  She recently received medication for breathing and is more comfortable at this time.     Past Medical History:  Diagnosis Date   Anemia    treated ~2009 with iron, resolved   Arthritis    Cataract     surgery on R catarct 2012   Chronic systolic CHF (congestive heart failure) (HCC)    a. TTE 2012: EF of 45-50%, mild diffuse HK, trivial AI, mild MR, mildly dilated RV with mild reduction of RVSF, mild to moderate TR, mild to moderate PR, mildly elevated PASP   COPD (chronic obstructive pulmonary disease) (HCC)    Family history of adverse reaction to anesthesia    Mother - altered mental status (long term)   Glaucoma    History of pelvic fracture    Hyperglycemia    mild inc in fasting sugar   Hyperlipidemia    Hypothyroidism    Insomnia    Migraines    without aura, sx since age 40   Osteopenia    prev with 10 years of fosamax and intolerant of evista   PAF (paroxysmal atrial fibrillation) (HCC)    a. initially noted 2012; b. CHADS2VASc at least 5 (CHF, age x 2, vasacular disease, female)   Pulmonary hypertension (HCC)    Wears dentures    full upper and lower   Past Surgical History:  Procedure Laterality Date   CATARACT EXTRACTION     CATARACT EXTRACTION W/PHACO Left 08/27/2016   Procedure: CATARACT EXTRACTION PHACO AND INTRAOCULAR LENS PLACEMENT (IOC)  Left;  Surgeon: [name], MD;  Location: [facility];  Service: Ophthalmology;  Laterality: Left;   COLONOSCOPY  2012   INTRAMEDULLARY (IM) NAIL INTERTROCHANTERIC Right 02/07/2023   Procedure: INTRAMEDULLARY (IM) NAIL INTERTROCHANTERIC;  Surgeon: Elner Hahn, MD;  Location: [facility];  Service: Orthopedics;  Laterality: Right;   INTRAMEDULLARY (IM) NAIL INTERTROCHANTERIC Left 08/04/2023  Procedure: FIXATION, FRACTURE, INTERTROCHANTERIC, WITH INTRAMEDULLARY ROD;  Surgeon: Marlynn Singer, MD;  Location: [facility];  Service: Orthopedics;  Laterality: Left;   REFRACTIVE SURGERY      Allergies  Allergen Reactions   Codeine     Doesn't remember reaction   Cortisone     Doesn't remember reaction   Decongestant [Pseudoephedrine Hcl] Other (See Comments)    Avoid decongestants due to glaucoma hx.     Donepezil       Prolonged Qt   Naproxen     Stomach issues   Simvastatin      aches   Statins Other (See Comments)    Aches     Outpatient Encounter Medications as of 10/07/2023  Medication Sig   acetaminophen  (TYLENOL ) 325 MG tablet Take 650 mg by mouth every 6 (six) hours as needed.   ipratropium-albuterol  (DUONEB) 0.5-2.5 (3) MG/3ML SOLN Take 3 mLs by nebulization every 6 (six) hours as needed.   lactose free nutrition (BOOST) LIQD Take 237 mLs by mouth 2 (two) times daily.   LORazepam  (ATIVAN ) 0.5 MG tablet Take 1 tablet (0.5 mg total) by mouth every 4 (four) hours. Take 1 tablet (0.5 mg total) by mouth at bedtime. May also take 1 tablet (0.5 mg total) every 4 (eight) hours as needed for anxiety (shortness of breath, agitation).   morphine  (ROXANOL) 20 MG/ML concentrated solution Take 0.25 mLs (5 mg total) by mouth every 4 (four) hours. May also take 0.25 mLs (5 mg total) every hour as needed for severe pain (pain score 7-10), breakthrough pain, anxiety or shortness of breath (air hunger).   OXYGEN  Inhale into the lungs. 2lpm as needed.   Zinc Oxide (TRIPLE PASTE) 40 % PSTE Apply 1 Application topically as needed (to buttocks and sacrum).   [DISCONTINUED] LORazepam  (ATIVAN ) 0.5 MG tablet Take 1 tablet (0.5 mg total) by mouth at bedtime. May also take 1 tablet (0.5 mg total) every 4 (eight) hours as needed for anxiety (shortness of breath, agitation).   [DISCONTINUED] morphine  (ROXANOL) 20 MG/ML concentrated solution Take 0.25 mLs (5 mg total) by mouth every 6 (six) hours. May also take 0.25 mLs (5 mg total) every hour as needed for severe pain (pain score 7-10), breakthrough pain, anxiety or shortness of breath (air hunger).   [DISCONTINUED] torsemide (DEMADEX) 10 MG tablet Take 10 mg by mouth daily.   No facility-administered encounter medications on file as of 10/07/2023.    Review of Systems  Immunization History  Administered Date(s) Administered   Influenza Inj Mdck Quad Pf 01/28/2019    Influenza Split 01/25/2011, 01/24/2012, 01/21/2013   Influenza, High Dose Seasonal PF 03/15/2021, 01/08/2022, 03/07/2023   Influenza,inj,Quad PF,6+ Mos 01/14/2017   Influenza-Unspecified 01/20/2014, 01/19/2016, 01/17/2018, 01/22/2020   Moderna Sars-Covid-2 Vaccination 05/06/2019, 06/03/2019, 03/07/2020   PNEUMOCOCCAL CONJUGATE-20 03/11/2023   Pneumococcal Conjugate-13 05/24/2014   Pneumococcal Polysaccharide-23 05/14/2011   Respiratory Syncytial Virus Vaccine,Recomb Aduvanted(Arexvy) 03/11/2023   Tdap 01/07/2012, 02/07/2023   Pertinent  Health Maintenance Due  Topic Date Due   OPHTHALMOLOGY EXAM  Never done   INFLUENZA VACCINE  11/22/2023   HEMOGLOBIN A1C  03/14/2024   FOOT EXAM  07/15/2024   DEXA SCAN  Completed      02/15/2022    1:21 PM 05/08/2022   12:30 PM 06/13/2022    3:40 PM 08/10/2022    3:02 PM 07/16/2023    1:13 PM  Fall Risk  Falls in the past year?  1 0 1 1  Was there an injury with Fall?  0  0 1 1  Fall Risk Category Calculator  2 0 3 2  (RETIRED) Patient Fall Risk Level Low fall risk       Patient at Risk for Falls Due to  History of fall(s);Impaired balance/gait No Fall Risks Impaired balance/gait;Impaired mobility;History of fall(s) History of fall(s)  Fall risk Follow up  Falls evaluation completed  Education provided;Falls prevention discussed Falls evaluation completed;Falls prevention discussed      Data saved with a previous flowsheet row definition   Functional Status Survey:    Vitals:   10/07/23 2050  BP: 110/66  Pulse: 83  Resp: 16  Temp: 97.8 F (36.6 C)  SpO2: 92%  Weight: 124 lb (56.2 kg)   Body mass index is 21.28 kg/m. Physical Exam  Cardiovascular:     Rate and Rhythm: Normal rate.     Pulses: Normal pulses.  Pulmonary:     Effort: Pulmonary effort is normal.  Abdominal:     General: Abdomen is flat.     Palpations: Abdomen is soft.   Skin:    General: Skin is warm and dry.   Neurological:     Mental Status: She is alert.      Labs reviewed: Recent Labs    02/08/23 0455 02/09/23 0631 02/10/23 0656 02/11/23 0352 02/12/23 0349 02/13/23 0932 08/05/23 0435 08/06/23 0434 08/07/23 0322 08/15/23 0000 09/09/23 0000  NA 140 137   < > 140 138   < > 139 138 135 141 138  K 4.2 4.6   < > 3.4* 3.8   < > 4.3 3.9 4.1 3.6 3.7  CL 110 108   < > 104 102   < > 105 106 106 98* 95*  CO2 22 22   < > 27 27   < > 22 23 24  31* 31*  GLUCOSE 96 149*   < > 86 99   < > 234* 86 222*  --   --   BUN 21 27*   < > 18 16   < > 24* 26* 22 17 13   CREATININE 1.08* 1.19*   < > 0.60 0.56   < > 1.28* 0.90 0.83 0.7 0.8  CALCIUM  8.2* 8.3*   < > 8.0* 8.2*   < > 8.1* 8.3* 8.6* 8.7 9.2  MG 1.7 2.3  --  1.9 2.0  --   --   --   --   --   --   PHOS 4.1 4.4  --   --  2.8  --   --   --   --   --   --    < > = values in this interval not displayed.   Recent Labs    02/07/23 0814 02/10/23 0656 07/11/23 0000 09/09/23 0000  AST 37 47* 14 18  ALT 17 7 5* 8  ALKPHOS 37* 37* 73 134*  BILITOT 0.7 0.9  --   --   PROT 5.0* 4.9*  --   --   ALBUMIN 2.8* 2.6* 3.7 3.9   Recent Labs    04/11/23 0000 07/11/23 0000 08/05/23 0435 08/06/23 0434 08/07/23 0322 08/15/23 0000 09/12/23 0000  WBC 10.6   < > 16.0* 11.7* 10.0 12.4 9.3  NEUTROABS 8,226.00  --   --   --   --  9,957.00 6,836.00  HGB 13.2   < > 10.6* 8.8* 8.3* 10.0* 11.7*  HCT 40   < > 34.4* 27.6* 26.1* 34* 39  MCV  --    < >  91.0 89.3 89.7  --   --   PLT 334   < > 210 177 200 367 358   < > = values in this interval not displayed.   Lab Results  Component Value Date   TSH 2.176 08/05/2023   Lab Results  Component Value Date   HGBA1C 8.3 09/12/2023   Lab Results  Component Value Date   CHOL 226 (H) 05/08/2022   HDL 62.30 05/08/2022   LDLCALC 137 (H) 05/08/2022   TRIG 133.0 05/08/2022   CHOLHDL 4 05/08/2022    Significant Diagnostic Results in last 30 days:  No results found.  Assessment/Plan CHF Shortness of breath associated with discontinuation of midodrine  and  noncompliance with supplemental oxygen  use. - Provide supportive care for symptoms. - schedule morphine  and ativan  q4 hours  Agitation Increased agitation associated with discontinuation of midodrine . - Schedule morphine  and lorazepam  for symptom management.  Atrial fibrillation Atrial fibrillation managed with comfort measures only.  Heart failure Heart failure managed with comfort measures only. Discontinue Torsemide.   Dementia Dementia managed with comfort measures only.  Family/ staff Communication: nursing   Labs/tests ordered:  none

## 2023-10-09 ENCOUNTER — Encounter: Payer: Self-pay | Admitting: Student

## 2023-10-09 ENCOUNTER — Non-Acute Institutional Stay (SKILLED_NURSING_FACILITY): Payer: Self-pay | Admitting: Student

## 2023-10-09 DIAGNOSIS — Z515 Encounter for palliative care: Secondary | ICD-10-CM | POA: Diagnosis not present

## 2023-10-09 NOTE — Progress Notes (Unsigned)
 Location:  Other [place].  Nursing Home Room Number: [facility] [address] Place of Service:  SNF [phone]) Provider:  [name], MD  Patient Care Team: [name], MD as PCP - General (Family Medicine) [name], MD as Referring Physician (Ophthalmology) [name], MD as Consulting Physician (Cardiology)  Extended Emergency Contact Information Primary Emergency Contact: [name],[name]  [place] Home Phone: [phone] Relation: Daughter Secondary Emergency Contact: Garrabrant,[name] Mobile Phone: [phone] Relation: Son  Code Status:  DNR Goals of care: Advanced Directive information    09/23/2023   11:33 AM  Advanced Directives  Does Patient Have a Medical Advance Directive? Yes  Type of Advance Directive Out of facility DNR (pink MOST or yellow form)  Does patient want to make changes to medical advance directive? No - Patient declined     Chief Complaint  Patient presents with   End of Life Care    End of Life Care.     HPI:  Pt is a 89 y.o. female seen today for End of Life Care. Patient is resting in bed. Surrounded by family - daughters and son, DIL.    Past Medical History:  Diagnosis Date   Anemia    treated ~2009 with iron, resolved   Arthritis    Cataract    surgery on R catarct 2012   Chronic systolic CHF (congestive heart failure) (HCC)    a. TTE 2012: EF of 45-50%, mild diffuse HK, trivial AI, mild MR, mildly dilated RV with mild reduction of RVSF, mild to moderate TR, mild to moderate PR, mildly elevated PASP   COPD (chronic obstructive pulmonary disease) (HCC)    Family history of adverse reaction to anesthesia    Mother - altered mental status (long term)   Glaucoma    History of pelvic fracture    Hyperglycemia    mild inc in fasting sugar   Hyperlipidemia    Hypothyroidism    Insomnia    Migraines    without aura, sx since age 33   Osteopenia    prev with 10 years of fosamax and intolerant of  evista   PAF (paroxysmal atrial fibrillation) (HCC)    a. initially noted 2012; b. CHADS2VASc at least 5 (CHF, age x 2, vasacular disease, female)   Pulmonary hypertension (HCC)    Wears dentures    full upper and lower   Past Surgical History:  Procedure Laterality Date   CATARACT EXTRACTION     CATARACT EXTRACTION W/PHACO Left 08/27/2016   Procedure: CATARACT EXTRACTION PHACO AND INTRAOCULAR LENS PLACEMENT (IOC)  Left;  Surgeon: [name], MD;  Location: [facility];  Service: Ophthalmology;  Laterality: Left;   COLONOSCOPY  2012   INTRAMEDULLARY (IM) NAIL INTERTROCHANTERIC Right 02/07/2023   Procedure: INTRAMEDULLARY (IM) NAIL INTERTROCHANTERIC;  Surgeon: Elner Hahn, MD;  Location: [facility];  Service: Orthopedics;  Laterality: Right;   INTRAMEDULLARY (IM) NAIL INTERTROCHANTERIC Left 08/04/2023   Procedure: FIXATION, FRACTURE, INTERTROCHANTERIC, WITH INTRAMEDULLARY ROD;  Surgeon: Marlynn Singer, MD;  Location: [facility];  Service: Orthopedics;  Laterality: Left;   REFRACTIVE SURGERY      Allergies  Allergen Reactions   Codeine     Doesn't remember reaction   Cortisone     Doesn't remember reaction   Decongestant [Pseudoephedrine Hcl] Other (See Comments)    Avoid decongestants due to glaucoma hx.     Donepezil      Prolonged Qt   Naproxen     Stomach issues   Simvastatin   aches   Statins Other (See Comments)    Aches     Outpatient Encounter Medications as of 10/09/2023  Medication Sig   acetaminophen  (TYLENOL ) 325 MG tablet Take 650 mg by mouth every 6 (six) hours as needed.   atropine 1 % ophthalmic solution Place 2 drops under the tongue every 2 (two) hours as needed.   ipratropium-albuterol  (DUONEB) 0.5-2.5 (3) MG/3ML SOLN Take 3 mLs by nebulization every 6 (six) hours as needed.   lactose free nutrition (BOOST) LIQD Take 237 mLs by mouth 2 (two) times daily.   LORazepam  (ATIVAN ) 0.5 MG tablet Take 1 tablet (0.5 mg total) by mouth every 4 (four)  hours. Take 1 tablet (0.5 mg total) by mouth at bedtime. May also take 1 tablet (0.5 mg total) every 4 (eight) hours as needed for anxiety (shortness of breath, agitation). (Patient taking differently: Take 0.5 mg by mouth every 4 (four) hours as needed.)   morphine  (ROXANOL) 20 MG/ML concentrated solution Take 0.25 mLs (5 mg total) by mouth every 4 (four) hours. May also take 0.25 mLs (5 mg total) every hour as needed for severe pain (pain score 7-10), breakthrough pain, anxiety or shortness of breath (air hunger). (Patient taking differently: Take 0.25 mLs (5 mg total) by mouth every 1 hour as needed)   OXYGEN  Inhale into the lungs. 2lpm as needed.   Zinc Oxide (TRIPLE PASTE) 40 % PSTE Apply 1 Application topically as needed (to buttocks and sacrum).   No facility-administered encounter medications on file as of 10/09/2023.    Review of Systems  Immunization History  Administered Date(s) Administered   Influenza Inj Mdck Quad Pf 01/28/2019   Influenza Split 01/25/2011, 01/24/2012, 01/21/2013   Influenza, High Dose Seasonal PF 03/15/2021, 01/08/2022, 03/07/2023   Influenza,inj,Quad PF,6+ Mos 01/14/2017   Influenza-Unspecified 01/20/2014, 01/19/2016, 01/17/2018, 01/22/2020   Moderna Sars-Covid-2 Vaccination 05/06/2019, 06/03/2019, 03/07/2020   PNEUMOCOCCAL CONJUGATE-20 03/11/2023   Pneumococcal Conjugate-13 05/24/2014   Pneumococcal Polysaccharide-23 05/14/2011   Respiratory Syncytial Virus Vaccine,Recomb Aduvanted(Arexvy) 03/11/2023   Tdap 01/07/2012, 02/07/2023   Pertinent  Health Maintenance Due  Topic Date Due   OPHTHALMOLOGY EXAM  Never done   INFLUENZA VACCINE  11/22/2023   HEMOGLOBIN A1C  03/14/2024   FOOT EXAM  07/15/2024   DEXA SCAN  Completed      02/15/2022    1:21 PM 05/08/2022   12:30 PM 06/13/2022    3:40 PM 08/10/2022    3:02 PM 07/16/2023    1:13 PM  Fall Risk  Falls in the past year?  1 0 1 1  Was there an injury with Fall?  0 0 1 1  Fall Risk Category Calculator   2 0 3 2  (RETIRED) Patient Fall Risk Level Low fall risk       Patient at Risk for Falls Due to  History of fall(s);Impaired balance/gait No Fall Risks Impaired balance/gait;Impaired mobility;History of fall(s) History of fall(s)  Fall risk Follow up  Falls evaluation completed  Education provided;Falls prevention discussed Falls evaluation completed;Falls prevention discussed      Data saved with a previous flowsheet row definition   Functional Status Survey:    Vitals:   10/09/23 0941  BP: 110/66  Pulse: 83  Resp: 16  Temp: 97.8 F (36.6 C)  SpO2: 92%  Weight: 124 lb (56.2 kg)  Height: 5' 4 (1.626 m)   Body mass index is 21.28 kg/m. Physical Exam Constitutional:      Comments: Sleeping, slightly furrowed brow.  Cardiovascular:     Rate and Rhythm: Bradycardia present. Rhythm irregular.     Pulses: Normal pulses.  Pulmonary:     Effort: Pulmonary effort is normal.  Abdominal:     General: Abdomen is flat.     Palpations: Abdomen is soft.     Labs reviewed: Recent Labs    02/08/23 0455 02/09/23 0631 02/10/23 0656 02/11/23 0352 02/12/23 0349 02/13/23 0932 08/05/23 0435 08/06/23 0434 08/07/23 0322 08/15/23 0000 09/09/23 0000  NA 140 137   < > 140 138   < > 139 138 135 141 138  K 4.2 4.6   < > 3.4* 3.8   < > 4.3 3.9 4.1 3.6 3.7  CL 110 108   < > 104 102   < > 105 106 106 98* 95*  CO2 22 22   < > 27 27   < > 22 23 24  31* 31*  GLUCOSE 96 149*   < > 86 99   < > 234* 86 222*  --   --   BUN 21 27*   < > 18 16   < > 24* 26* 22 17 13   CREATININE 1.08* 1.19*   < > 0.60 0.56   < > 1.28* 0.90 0.83 0.7 0.8  CALCIUM  8.2* 8.3*   < > 8.0* 8.2*   < > 8.1* 8.3* 8.6* 8.7 9.2  MG 1.7 2.3  --  1.9 2.0  --   --   --   --   --   --   PHOS 4.1 4.4  --   --  2.8  --   --   --   --   --   --    < > = values in this interval not displayed.   Recent Labs    02/07/23 0814 02/10/23 0656 07/11/23 0000 09/09/23 0000  AST 37 47* 14 18  ALT 17 7 5* 8  ALKPHOS 37* 37* 73 134*   BILITOT 0.7 0.9  --   --   PROT 5.0* 4.9*  --   --   ALBUMIN 2.8* 2.6* 3.7 3.9   Recent Labs    04/11/23 0000 07/11/23 0000 08/05/23 0435 08/06/23 0434 08/07/23 0322 08/15/23 0000 09/12/23 0000  WBC 10.6   < > 16.0* 11.7* 10.0 12.4 9.3  NEUTROABS 8,226.00  --   --   --   --  9,957.00 6,836.00  HGB 13.2   < > 10.6* 8.8* 8.3* 10.0* 11.7*  HCT 40   < > 34.4* 27.6* 26.1* 34* 39  MCV  --    < > 91.0 89.3 89.7  --   --   PLT 334   < > 210 177 200 367 358   < > = values in this interval not displayed.   Lab Results  Component Value Date   TSH 2.176 08/05/2023   Lab Results  Component Value Date   HGBA1C 8.3 09/12/2023   Lab Results  Component Value Date   CHOL 226 (H) 05/08/2022   HDL 62.30 05/08/2022   LDLCALC 137 (H) 05/08/2022   TRIG 133.0 05/08/2022   CHOLHDL 4 05/08/2022    Significant Diagnostic Results in last 30 days:  No results found.  Assessment/Plan 1. Hospice care Patient appears to be in the last 48 hours of life. Goal is comfort and to support symptoms. Add secretions medications. Schedule ativan  and morphine  q4 hours. Continue morphine  5mg  q1 hour prn for shortness of breath or air hunger and  ativan  1 mg q4 hours for agitation, anxiety.    Family/ staff Communication: Nursing, children  Labs/tests ordered:  none

## 2023-10-10 ENCOUNTER — Encounter: Payer: Self-pay | Admitting: Student

## 2023-10-10 MED ORDER — LORAZEPAM 0.5 MG PO TABS
ORAL_TABLET | ORAL | 0 refills | Status: DC
Start: 2023-10-10 — End: 2023-11-28

## 2023-10-10 MED ORDER — MORPHINE SULFATE (CONCENTRATE) 20 MG/ML PO SOLN: ORAL | 0 refills | Status: DC

## 2023-10-14 ENCOUNTER — Encounter: Payer: Self-pay | Admitting: Student

## 2023-10-14 ENCOUNTER — Non-Acute Institutional Stay (SKILLED_NURSING_FACILITY): Payer: Self-pay | Admitting: Student

## 2023-10-14 DIAGNOSIS — Z515 Encounter for palliative care: Secondary | ICD-10-CM

## 2023-10-22 DIAGNOSIS — 419620001 Death: Secondary | SNOMED CT | POA: Diagnosis not present

## 2023-10-22 NOTE — Progress Notes (Addendum)
 Location:  Other Nursing Home Room Number: [facility] [address] Place of Service:  SNF [phone])  Provider: Abdul  PCP: Abdul Fine, MD Patient Care Team: Abdul Fine, MD as PCP - General (Family Medicine) Mittie Gaskin, MD as Referring Physician (Ophthalmology) Perla Evalene PARAS, MD as Consulting Physician (Cardiology)  Extended Emergency Contact Information Primary Emergency Contact: [name],[name]  [place] Home Phone: [phone] Relation: Daughter Secondary Emergency Contact: Mirabile,[name] Mobile Phone: [phone] Relation: Son  Code Status: DNR Goals of care:  Advanced Directive information    09/23/2023   11:33 AM  Advanced Directives  Does Patient Have a Medical Advance Directive? Yes  Type of Advance Directive Out of facility DNR (pink MOST or yellow form)  Does patient want to make changes to medical advance directive? No - Patient declined     Allergies  Allergen Reactions   Codeine     Doesn't remember reaction   Cortisone     Doesn't remember reaction   Decongestant [Pseudoephedrine Hcl] Other (See Comments)    Avoid decongestants due to glaucoma hx.     Donepezil      Prolonged Qt   Naproxen     Stomach issues   Simvastatin      aches   Statins Other (See Comments)    Aches     Chief Complaint  Patient presents with   Acute Visit    End of Life Care    HPI:  89 y.o. female  History of Present Illness The patient presents for a follow-up regarding end-of-life care and comfort measures.  The patient and their family are discussing the recent passing of a loved one who was under end-of-life care. The family notes that the loved one was very comfortable in their final moments, passing peacefully after the removal of oxygen  support.  The family mentions that the loved one exceeded the expected hours of life, experiencing a prolonged period without food or drink. They reflect on the variability of end-of-life trajectories,  with some individuals experiencing extended periods of minimal intake.   Past Medical History:  Diagnosis Date   Anemia    treated ~2009 with iron, resolved   Arthritis    Cataract    surgery on R catarct 2012   Chronic systolic CHF (congestive heart failure) (HCC)    a. TTE 2012: EF of 45-50%, mild diffuse HK, trivial AI, mild MR, mildly dilated RV with mild reduction of RVSF, mild to moderate TR, mild to moderate PR, mildly elevated PASP   COPD (chronic obstructive pulmonary disease) (HCC)    Family history of adverse reaction to anesthesia    Mother - altered mental status (long term)   Glaucoma    History of pelvic fracture    Hyperglycemia    mild inc in fasting sugar   Hyperlipidemia    Hypothyroidism    Insomnia    Migraines    without aura, sx since age 4   Osteopenia    prev with 10 years of fosamax and intolerant of evista   PAF (paroxysmal atrial fibrillation) (HCC)    a. initially noted 2012; b. CHADS2VASc at least 5 (CHF, age x 2, vasacular disease, female)   Pulmonary hypertension (HCC)    Wears dentures    full upper and lower    Past Surgical History:  Procedure Laterality Date   CATARACT EXTRACTION     CATARACT EXTRACTION W/PHACO Left 08/27/2016   Procedure: CATARACT EXTRACTION PHACO AND INTRAOCULAR LENS PLACEMENT (IOC)  Left;  Surgeon: Mittie,  Dene, MD;  Location: [facility];  Service: Ophthalmology;  Laterality: Left;   COLONOSCOPY  2012   INTRAMEDULLARY (IM) NAIL INTERTROCHANTERIC Right 02/07/2023   Procedure: INTRAMEDULLARY (IM) NAIL INTERTROCHANTERIC;  Surgeon: Edie Norleen PARAS, MD;  Location: [facility];  Service: Orthopedics;  Laterality: Right;   INTRAMEDULLARY (IM) NAIL INTERTROCHANTERIC Left 08/04/2023   Procedure: FIXATION, FRACTURE, INTERTROCHANTERIC, WITH INTRAMEDULLARY ROD;  Surgeon: Cleotilde Barrio, MD;  Location: [facility];  Service: Orthopedics;  Laterality: Left;   REFRACTIVE SURGERY        reports that she has quit smoking. Her  smoking use included cigarettes. She has a 12.5 pack-year smoking history. She has never used smokeless tobacco. She reports that she does not drink alcohol and does not use drugs. Social History   Socioeconomic History   Marital status: Widowed    Spouse name: Not on file   Number of children: Not on file   Years of education: Not on file   Highest education level: Some college, no degree  Occupational History   Not on file  Tobacco Use   Smoking status: Former    Current packs/day: 0.25    Average packs/day: 0.3 packs/day for 50.0 years (12.5 ttl pk-yrs)    Types: Cigarettes   Smokeless tobacco: Never   Tobacco comments:    started smoking in 1958- stopped 2020 after a fall  Vaping Use   Vaping status: Never Used  Substance and Sexual Activity   Alcohol use: No   Drug use: No   Sexual activity: Never  Other Topics Concern   Not on file  Social History Narrative   From [place].     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  Was married for 47 years.     Active in church and senior groups.     Likes gospel singing.     Lives alone (with her dog Geofm)   4 kids, all are nearby.     Social Drivers of Corporate investment banker Strain: Low Risk  (08/10/2022)   Overall Financial Resource Strain (CARDIA)    Difficulty of Paying Living Expenses: Not very hard  Food Insecurity: No Food Insecurity (08/05/2023)   Hunger Vital Sign    Worried About Running Out of Food in the Last Year: Never true    Ran Out of Food in the Last Year: Never true  Transportation Needs: No Transportation Needs (08/05/2023)   PRAPARE - Administrator, Civil Service (Medical): No    Lack of Transportation (Non-Medical): No  Physical Activity: Unknown (08/10/2022)   Exercise Vital Sign    Days of Exercise per Week: 0 days    Minutes of Exercise per Session: Not on file  Recent Concern: Physical Activity - Inactive (08/10/2022)   Exercise Vital Sign    Days of Exercise per Week: 0 days     Minutes of Exercise per Session: 0 min  Stress: No Stress Concern Present (08/10/2022)   [employer] of Occupational Health - Occupational Stress Questionnaire    Feeling of Stress : Not at all  Social Connections: Socially Isolated (08/05/2023)   Social Connection and Isolation Panel    Frequency of Communication with Friends and Family: Once a week    Frequency of Social Gatherings with Friends and Family: Once a week    Attends Religious Services: Never    Database administrator or Organizations: Not on file    Attends Banker Meetings: Never  Marital Status: Widowed  Intimate Partner Violence: Not At Risk (08/05/2023)   Humiliation, Afraid, Rape, and Kick questionnaire    Fear of Current or Ex-Partner: No    Emotionally Abused: No    Physically Abused: No    Sexually Abused: No   Functional Status Survey:    Allergies  Allergen Reactions   Codeine     Doesn't remember reaction   Cortisone     Doesn't remember reaction   Decongestant [Pseudoephedrine Hcl] Other (See Comments)    Avoid decongestants due to glaucoma hx.     Donepezil      Prolonged Qt   Naproxen     Stomach issues   Simvastatin      aches   Statins Other (See Comments)    Aches     Pertinent  Health Maintenance Due  Topic Date Due   OPHTHALMOLOGY EXAM  Never done   INFLUENZA VACCINE  11/22/2023   HEMOGLOBIN A1C  03/14/2024   FOOT EXAM  07/15/2024   DEXA SCAN  Completed    Medications: Outpatient Encounter Medications as of 09/26/2023  Medication Sig   acetaminophen  (TYLENOL ) 325 MG tablet Take 650 mg by mouth every 6 (six) hours as needed.   atropine 1 % ophthalmic solution Place 2 drops under the tongue every 2 (two) hours as needed.   ipratropium-albuterol  (DUONEB) 0.5-2.5 (3) MG/3ML SOLN Take 3 mLs by nebulization every 6 (six) hours as needed.   lactose free nutrition (BOOST) LIQD Take 237 mLs by mouth 2 (two) times daily.   LORazepam  (ATIVAN ) 0.5 MG tablet Take 1 tablet  (0.5 mg total) by mouth every 4 (four) hours. May also take 2 tablets (1 mg total) every 4 (four) hours as needed for anxiety.   morphine  (ROXANOL) 20 MG/ML concentrated solution Take 0.25 mLs (5 mg total) by mouth every 4 (four) hours. May also take 0.25 mLs (5 mg total) every hour as needed for severe pain (pain score 7-10), breakthrough pain, anxiety or shortness of breath (air hunger). Take 0.25 mLs (5 mg total) by mouth every 1 hour as needed.   OXYGEN  Inhale into the lungs. 2lpm as needed.   Zinc Oxide (TRIPLE PASTE) 40 % PSTE Apply 1 Application topically as needed (to buttocks and sacrum).   No facility-administered encounter medications on file as of 09/29/2023.    Review of Systems  There were no vitals filed for this visit. There is no height or weight on file to calculate BMI. Physical Exam  Labs reviewed: Basic Metabolic Panel: Recent Labs    02/08/23 0455 02/09/23 0631 02/10/23 9343 02/11/23 0352 02/12/23 0349 02/13/23 0932 08/05/23 0435 08/06/23 0434 08/07/23 0322 08/15/23 0000 09/09/23 0000  NA 140 137   < > 140 138   < > 139 138 135 141 138  K 4.2 4.6   < > 3.4* 3.8   < > 4.3 3.9 4.1 3.6 3.7  CL 110 108   < > 104 102   < > 105 106 106 98* 95*  CO2 22 22   < > 27 27   < > 22 23 24  31* 31*  GLUCOSE 96 149*   < > 86 99   < > 234* 86 222*  --   --   BUN 21 27*   < > 18 16   < > 24* 26* 22 17 13   CREATININE 1.08* 1.19*   < > 0.60 0.56   < > 1.28* 0.90 0.83 0.7 0.8  CALCIUM  8.2* 8.3*   < >  8.0* 8.2*   < > 8.1* 8.3* 8.6* 8.7 9.2  MG 1.7 2.3  --  1.9 2.0  --   --   --   --   --   --   PHOS 4.1 4.4  --   --  2.8  --   --   --   --   --   --    < > = values in this interval not displayed.   Liver Function Tests: Recent Labs    02/07/23 0814 02/10/23 0656 07/11/23 0000 09/09/23 0000  AST 37 47* 14 18  ALT 17 7 5* 8  ALKPHOS 37* 37* 73 134*  BILITOT 0.7 0.9  --   --   PROT 5.0* 4.9*  --   --   ALBUMIN 2.8* 2.6* 3.7 3.9   Recent Labs    02/07/23 0814   LIPASE 28   No results for input(s): AMMONIA in the last 8760 hours. CBC: Recent Labs    04/11/23 0000 07/11/23 0000 08/05/23 0435 08/06/23 0434 08/07/23 0322 08/15/23 0000 09/12/23 0000  WBC 10.6   < > 16.0* 11.7* 10.0 12.4 9.3  NEUTROABS 8,226.00  --   --   --   --  9,957.00 6,836.00  HGB 13.2   < > 10.6* 8.8* 8.3* 10.0* 11.7*  HCT 40   < > 34.4* 27.6* 26.1* 34* 39  MCV  --    < > 91.0 89.3 89.7  --   --   PLT 334   < > 210 177 200 367 358   < > = values in this interval not displayed.   Cardiac Enzymes: Recent Labs    02/07/23 0814  CKTOTAL 336*   BNP: Invalid input(s): POCBNP CBG: Recent Labs    08/06/23 2137 08/07/23 0752 08/07/23 1156  GLUCAP 136* 129* 169*    Procedures and Imaging Studies During Stay: No results found.  Assessment/Plan:   Patient deceased during stay  End of life care  They passed away peacefully at 8:13AM after a period of comfort care, with family and friends providing support. - Complete all necessary notifications to the funeral home.  Patient is being discharged to funeral home  Future labs/tests needed:  none

## 2023-10-22 DEATH — deceased

## 2023-10-23 ENCOUNTER — Telehealth: Payer: Self-pay | Admitting: Student

## 2023-10-23 NOTE — Telephone Encounter (Signed)
Death certificate completed.

## 2023-10-23 NOTE — Telephone Encounter (Signed)
 Dr. Abdul, I just received a call from [facility] regarding a patient who has been reported as deceased since 18-Oct-2023. They are requesting that the death certificate be signed in [place] [facility] as soon as possible. Please let me know if you need any additional information to complete this.

## 2023-10-24 NOTE — Telephone Encounter (Signed)
 Message routed to [employer].

## 2023-10-24 NOTE — Telephone Encounter (Signed)
 Noted
